# Patient Record
Sex: Female | Born: 1950 | Race: White | Hispanic: No | Marital: Married | Smoking: Former smoker
Health system: Southern US, Community
[De-identification: ages and names within clinical notes are randomized; demographics above are authoritative.]

## PROBLEM LIST (undated history)

## (undated) DIAGNOSIS — K635 Polyp of colon: Secondary | ICD-10-CM

## (undated) DIAGNOSIS — J449 Chronic obstructive pulmonary disease, unspecified: Secondary | ICD-10-CM

## (undated) DIAGNOSIS — F419 Anxiety disorder, unspecified: Secondary | ICD-10-CM

## (undated) DIAGNOSIS — G8929 Other chronic pain: Secondary | ICD-10-CM

## (undated) DIAGNOSIS — I499 Cardiac arrhythmia, unspecified: Secondary | ICD-10-CM

## (undated) DIAGNOSIS — M51369 Other intervertebral disc degeneration, lumbar region without mention of lumbar back pain or lower extremity pain: Secondary | ICD-10-CM

## (undated) DIAGNOSIS — M5136 Other intervertebral disc degeneration, lumbar region: Secondary | ICD-10-CM

## (undated) DIAGNOSIS — M1990 Unspecified osteoarthritis, unspecified site: Secondary | ICD-10-CM

## (undated) DIAGNOSIS — J42 Unspecified chronic bronchitis: Secondary | ICD-10-CM

## (undated) DIAGNOSIS — F32A Depression, unspecified: Secondary | ICD-10-CM

## (undated) DIAGNOSIS — F329 Major depressive disorder, single episode, unspecified: Secondary | ICD-10-CM

## (undated) DIAGNOSIS — R002 Palpitations: Secondary | ICD-10-CM

## (undated) DIAGNOSIS — R0789 Other chest pain: Secondary | ICD-10-CM

## (undated) DIAGNOSIS — M5030 Other cervical disc degeneration, unspecified cervical region: Secondary | ICD-10-CM

## (undated) DIAGNOSIS — I4891 Unspecified atrial fibrillation: Secondary | ICD-10-CM

## (undated) DIAGNOSIS — J45909 Unspecified asthma, uncomplicated: Secondary | ICD-10-CM

## (undated) DIAGNOSIS — E039 Hypothyroidism, unspecified: Secondary | ICD-10-CM

## (undated) DIAGNOSIS — J189 Pneumonia, unspecified organism: Secondary | ICD-10-CM

## (undated) DIAGNOSIS — E785 Hyperlipidemia, unspecified: Secondary | ICD-10-CM

## (undated) DIAGNOSIS — M542 Cervicalgia: Secondary | ICD-10-CM

## (undated) DIAGNOSIS — M549 Dorsalgia, unspecified: Secondary | ICD-10-CM

## (undated) DIAGNOSIS — K759 Inflammatory liver disease, unspecified: Secondary | ICD-10-CM

## (undated) DIAGNOSIS — G4730 Sleep apnea, unspecified: Secondary | ICD-10-CM

## (undated) DIAGNOSIS — E119 Type 2 diabetes mellitus without complications: Secondary | ICD-10-CM

## (undated) DIAGNOSIS — K219 Gastro-esophageal reflux disease without esophagitis: Secondary | ICD-10-CM

## (undated) DIAGNOSIS — I509 Heart failure, unspecified: Secondary | ICD-10-CM

## (undated) HISTORY — DX: Unspecified asthma, uncomplicated: J45.909

## (undated) HISTORY — PX: ANTERIOR CERVICAL DECOMP/DISCECTOMY FUSION: SHX1161

## (undated) HISTORY — DX: Heart failure, unspecified: I50.9

## (undated) HISTORY — DX: Polyp of colon: K63.5

## (undated) HISTORY — DX: Palpitations: R00.2

## (undated) HISTORY — DX: Other chest pain: R07.89

## (undated) HISTORY — DX: Unspecified atrial fibrillation: I48.91

## (undated) HISTORY — DX: Hyperlipidemia, unspecified: E78.5

## (undated) HISTORY — DX: Sleep apnea, unspecified: G47.30

## (undated) HISTORY — PX: WISDOM TOOTH EXTRACTION: SHX21

---
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
## 1974-06-09 HISTORY — PX: DILATION AND CURETTAGE OF UTERUS: SHX78

## 2000-10-19 ENCOUNTER — Encounter: Payer: Self-pay | Admitting: Neurosurgery

## 2000-10-22 ENCOUNTER — Encounter: Payer: Self-pay | Admitting: Neurosurgery

## 2000-10-22 ENCOUNTER — Ambulatory Visit (HOSPITAL_COMMUNITY): Admission: AD | Admit: 2000-10-22 | Discharge: 2000-10-22 | Payer: Self-pay | Admitting: Neurosurgery

## 2000-12-01 ENCOUNTER — Ambulatory Visit (HOSPITAL_COMMUNITY): Admission: RE | Admit: 2000-12-01 | Discharge: 2000-12-01 | Payer: Self-pay | Admitting: Neurosurgery

## 2000-12-01 ENCOUNTER — Encounter: Payer: Self-pay | Admitting: Neurosurgery

## 2001-02-01 ENCOUNTER — Ambulatory Visit (HOSPITAL_COMMUNITY): Admission: RE | Admit: 2001-02-01 | Discharge: 2001-02-01 | Payer: Self-pay | Admitting: Neurosurgery

## 2001-02-01 ENCOUNTER — Encounter: Payer: Self-pay | Admitting: Neurosurgery

## 2001-05-04 ENCOUNTER — Ambulatory Visit (HOSPITAL_COMMUNITY): Admission: RE | Admit: 2001-05-04 | Discharge: 2001-05-04 | Payer: Self-pay | Admitting: Neurosurgery

## 2001-05-04 ENCOUNTER — Encounter: Payer: Self-pay | Admitting: Neurosurgery

## 2001-07-30 ENCOUNTER — Ambulatory Visit (HOSPITAL_COMMUNITY): Admission: RE | Admit: 2001-07-30 | Discharge: 2001-07-30 | Payer: Self-pay | Admitting: Neurosurgery

## 2001-07-30 ENCOUNTER — Encounter: Payer: Self-pay | Admitting: Neurosurgery

## 2001-09-27 ENCOUNTER — Encounter: Payer: Self-pay | Admitting: Orthopedic Surgery

## 2001-09-27 ENCOUNTER — Ambulatory Visit (HOSPITAL_COMMUNITY): Admission: RE | Admit: 2001-09-27 | Discharge: 2001-09-27 | Payer: Self-pay | Admitting: Orthopedic Surgery

## 2001-11-29 ENCOUNTER — Encounter: Payer: Self-pay | Admitting: Neurosurgery

## 2001-11-29 ENCOUNTER — Encounter: Admission: RE | Admit: 2001-11-29 | Discharge: 2001-11-29 | Payer: Self-pay | Admitting: Neurosurgery

## 2002-02-28 ENCOUNTER — Encounter: Admission: RE | Admit: 2002-02-28 | Discharge: 2002-02-28 | Payer: Self-pay | Admitting: Neurosurgery

## 2002-02-28 ENCOUNTER — Encounter: Payer: Self-pay | Admitting: Neurosurgery

## 2002-04-22 ENCOUNTER — Encounter: Admission: RE | Admit: 2002-04-22 | Discharge: 2002-04-22 | Payer: Self-pay | Admitting: Family Medicine

## 2002-04-22 ENCOUNTER — Encounter: Payer: Self-pay | Admitting: Family Medicine

## 2003-05-16 ENCOUNTER — Encounter: Admission: RE | Admit: 2003-05-16 | Discharge: 2003-05-16 | Payer: Self-pay | Admitting: *Deleted

## 2003-06-10 DIAGNOSIS — E785 Hyperlipidemia, unspecified: Secondary | ICD-10-CM

## 2003-06-10 HISTORY — DX: Hyperlipidemia, unspecified: E78.5

## 2006-02-03 ENCOUNTER — Ambulatory Visit (HOSPITAL_COMMUNITY): Admission: RE | Admit: 2006-02-03 | Discharge: 2006-02-03 | Payer: Self-pay | Admitting: Anesthesiology

## 2006-07-28 ENCOUNTER — Inpatient Hospital Stay (HOSPITAL_COMMUNITY): Admission: EM | Admit: 2006-07-28 | Discharge: 2006-07-30 | Payer: Self-pay | Admitting: Emergency Medicine

## 2006-07-29 ENCOUNTER — Encounter (INDEPENDENT_AMBULATORY_CARE_PROVIDER_SITE_OTHER): Payer: Self-pay | Admitting: Cardiovascular Disease

## 2007-03-01 ENCOUNTER — Ambulatory Visit (HOSPITAL_COMMUNITY): Admission: RE | Admit: 2007-03-01 | Discharge: 2007-03-01 | Payer: Self-pay | Admitting: Cardiovascular Disease

## 2007-07-12 ENCOUNTER — Ambulatory Visit: Payer: Self-pay | Admitting: Internal Medicine

## 2007-07-27 ENCOUNTER — Ambulatory Visit: Payer: Self-pay | Admitting: Internal Medicine

## 2007-07-27 LAB — CONVERTED CEMR LAB
ALT: 19 units/L (ref 0–35)
AST: 19 units/L (ref 0–37)
TSH: 1.96 microintl units/mL (ref 0.35–5.50)

## 2007-09-10 ENCOUNTER — Ambulatory Visit: Payer: Self-pay | Admitting: Internal Medicine

## 2007-10-21 ENCOUNTER — Ambulatory Visit: Payer: Self-pay | Admitting: Internal Medicine

## 2007-10-21 ENCOUNTER — Ambulatory Visit: Payer: Self-pay

## 2007-10-21 ENCOUNTER — Encounter: Payer: Self-pay | Admitting: Internal Medicine

## 2007-11-17 ENCOUNTER — Ambulatory Visit: Payer: Self-pay | Admitting: Internal Medicine

## 2007-11-17 LAB — CONVERTED CEMR LAB
AST: 18 units/L (ref 0–37)
Cholesterol: 166 mg/dL (ref 0–200)
HDL: 39.9 mg/dL (ref >39.0)
LDL Cholesterol: 108 mg/dL — ABNORMAL HIGH (ref 0–99)
Total CHOL/HDL Ratio: 4.2
Triglycerides: 92 mg/dL (ref 0–149)
VLDL: 18 mg/dL (ref 0–40)

## 2008-06-30 ENCOUNTER — Encounter: Payer: Self-pay | Admitting: Internal Medicine

## 2008-07-15 ENCOUNTER — Emergency Department (HOSPITAL_BASED_OUTPATIENT_CLINIC_OR_DEPARTMENT_OTHER): Admission: EM | Admit: 2008-07-15 | Discharge: 2008-07-15 | Payer: Self-pay | Admitting: Emergency Medicine

## 2008-07-15 ENCOUNTER — Ambulatory Visit: Payer: Self-pay | Admitting: Interventional Radiology

## 2008-08-04 ENCOUNTER — Ambulatory Visit: Payer: Self-pay | Admitting: Internal Medicine

## 2008-08-06 DIAGNOSIS — E785 Hyperlipidemia, unspecified: Secondary | ICD-10-CM | POA: Insufficient documentation

## 2008-08-06 DIAGNOSIS — I4891 Unspecified atrial fibrillation: Secondary | ICD-10-CM | POA: Insufficient documentation

## 2009-05-01 ENCOUNTER — Telehealth: Payer: Self-pay | Admitting: Internal Medicine

## 2009-05-07 ENCOUNTER — Telehealth: Payer: Self-pay | Admitting: Internal Medicine

## 2009-06-09 HISTORY — PX: PLANTAR FASCIA SURGERY: SHX746

## 2009-07-02 ENCOUNTER — Encounter: Payer: Self-pay | Admitting: Internal Medicine

## 2009-07-13 ENCOUNTER — Ambulatory Visit: Payer: Self-pay | Admitting: Internal Medicine

## 2009-07-17 ENCOUNTER — Encounter: Payer: Self-pay | Admitting: Internal Medicine

## 2009-07-18 ENCOUNTER — Telehealth: Payer: Self-pay | Admitting: Internal Medicine

## 2009-07-19 ENCOUNTER — Telehealth: Payer: Self-pay | Admitting: Internal Medicine

## 2009-07-20 ENCOUNTER — Encounter: Payer: Self-pay | Admitting: Internal Medicine

## 2009-07-30 ENCOUNTER — Ambulatory Visit (HOSPITAL_COMMUNITY): Admission: RE | Admit: 2009-07-30 | Discharge: 2009-07-30 | Payer: Self-pay | Admitting: Internal Medicine

## 2009-07-30 ENCOUNTER — Ambulatory Visit: Payer: Self-pay

## 2009-07-30 ENCOUNTER — Encounter: Payer: Self-pay | Admitting: Internal Medicine

## 2009-07-30 ENCOUNTER — Ambulatory Visit: Payer: Self-pay | Admitting: Cardiovascular Disease

## 2009-08-01 ENCOUNTER — Encounter: Payer: Self-pay | Admitting: Internal Medicine

## 2009-08-13 ENCOUNTER — Telehealth: Payer: Self-pay | Admitting: Internal Medicine

## 2009-08-20 ENCOUNTER — Ambulatory Visit: Payer: Self-pay

## 2009-08-20 ENCOUNTER — Ambulatory Visit: Payer: Self-pay | Admitting: Internal Medicine

## 2009-08-23 ENCOUNTER — Encounter: Payer: Self-pay | Admitting: Internal Medicine

## 2009-08-23 ENCOUNTER — Telehealth: Payer: Self-pay | Admitting: Internal Medicine

## 2009-08-23 DIAGNOSIS — R002 Palpitations: Secondary | ICD-10-CM | POA: Insufficient documentation

## 2009-08-31 ENCOUNTER — Ambulatory Visit: Payer: Self-pay | Admitting: Internal Medicine

## 2009-09-14 ENCOUNTER — Telehealth: Payer: Self-pay | Admitting: Internal Medicine

## 2009-09-26 ENCOUNTER — Telehealth: Payer: Self-pay | Admitting: Internal Medicine

## 2009-11-30 ENCOUNTER — Ambulatory Visit: Payer: Self-pay | Admitting: Internal Medicine

## 2010-07-07 LAB — CONVERTED CEMR LAB
BUN: 12 mg/dL (ref 6–23)
Basophils Absolute: 0 10*3/uL (ref 0.0–0.1)
Basophils Relative: 0 % (ref 0–1)
CO2: 28 meq/L (ref 19–32)
Calcium: 9.3 mg/dL (ref 8.4–10.5)
Chloride: 104 meq/L (ref 96–112)
Creatinine, Ser: 0.75 mg/dL (ref 0.40–1.20)
Eosinophils Absolute: 0.5 10*3/uL (ref 0.0–0.7)
Eosinophils Relative: 5 % (ref 0–5)
FSH: 1.3 milliintl units/mL
Glucose, Bld: 109 mg/dL — ABNORMAL HIGH (ref 70–99)
HCT: 34 % — ABNORMAL LOW (ref 36.0–46.0)
Hemoglobin: 11.8 g/dL — ABNORMAL LOW (ref 12.0–15.0)
Lymphocytes Relative: 26 % (ref 12–46)
Lymphs Abs: 2.5 10*3/uL (ref 0.7–4.0)
MCHC: 34.7 g/dL (ref 30.0–36.0)
MCV: 93.4 fL (ref 78.0–100.0)
Monocytes Absolute: 0.8 10*3/uL (ref 0.1–1.0)
Monocytes Relative: 8 % (ref 3–12)
Neutro Abs: 5.9 10*3/uL (ref 1.7–7.7)
Neutrophils Relative %: 61 % (ref 43–77)
Platelets: 257 10*3/uL (ref 150–400)
Potassium: 4.4 meq/L (ref 3.5–5.3)
Pro B Natriuretic peptide (BNP): 263 pg/mL — ABNORMAL HIGH (ref 0.0–100.0)
RBC: 3.64 M/uL — ABNORMAL LOW (ref 3.87–5.11)
RDW: 14.1 % (ref 11.5–15.5)
Sodium: 140 meq/L (ref 135–145)
WBC: 9.7 10*3/uL (ref 4.0–10.5)

## 2010-07-09 NOTE — Miscellaneous (Signed)
  Clinical Lists Changes  Medications: Changed medication from LOPRESSOR 50 MG TABS (METOPROLOL TARTRATE) 1/2 tab daily or as directed to LOPRESSOR 50 MG TABS (METOPROLOL TARTRATE) 1/2 tab  2 times per day

## 2010-07-09 NOTE — Progress Notes (Signed)
Summary: receive called x 2 from life watch  lm to cb   Phone Note Call from Patient Call back at Home Phone [phone] Call back at Work Phone [phone]   Caller: Patient Reason for Call: Talk to Nurse Summary of Call: per pt calling, have a heart monitor on , receive a life watch x 2 this am . pt wondering is their anything wrong. Initial call taken by: Lorne Skeens,  September 14, 2009 9:23 AM  Follow-up for Phone Call        Reviewed life watch strips. Appears to be afib on 4 seperate occassions ths AM. Pt reports being called around 6 AM and 8 AM.  At 6 AM she felt "odd" and at 8 AM was aware of "rapid heart beat". Pt feels OK now. Is taking flecanide and metoprolol as prescibed. Taking ASA(81 mg) and took extra dose of ASA this AM. Will let Dr. Tenny Craw know Follow-up by: Dossie Arbour, RN, BSN,  September 14, 2009 10:49 AM  Additional Follow-up for Phone Call Additional follow up Details #1::        Dr. Tenny Craw notified and strips faxed to her to review. Dr. Tenny Craw would like [place] to let pt know she will review and then be back in touch with pt. Called pt to let her know this. Left message to call back. Dossie Arbour, RN, BSN  September 14, 2009 11:13 AM Pt. given this information. Pt states best way to reach her is on her cell phone--[phone] Additional Follow-up by: Dossie Arbour, RN, BSN,  September 14, 2009 3:30 PM     Appended Document: receive called x 2 from life watch  lm to cb Spoke with patient.  She said that since starting flecanide her palpitations have gotten better.  They are not gone, but much improved.  Shorter lived.  She had been creeping up on her caffeine some and is cutting back since Fri.  Fri AM she did feel a little odd.  OK since. Plan:  WIll discuss with EP   Patient needs refill on metoprolol.  Will call in   Will call pt later this wk  Appended Document: receive called x 2 from life watch  lm to cb Called patient.  She said since starting flecanide  the palpitations are much better.  they are not gone, but when she does have them they are shorter lived.  She admits to using more caffeine.  She has cut back since Friday and has not had palp since. Rec.:  Continue I will discuss with EP Patient needs refill on metoprolol WIll call her back later this wk.  Appended Document: receive called x 2 from life watch  lm to cb talked with pt by telephone--she actually needs lovastatin refilled and not metoprolol-   Clinical Lists Changes  Medications: Changed medication from MEVACOR 20 MG TABS (LOVASTATIN) 1 tab once daily to MEVACOR 20 MG TABS (LOVASTATIN) 1 tab once daily - Signed Rx of MEVACOR 20 MG TABS (LOVASTATIN) 1 tab once daily;  #30 x 6;  Signed;  Entered by: Katina Dung, RN, BSN;  Authorized by: Sherrill Raring, MD, [facility];  Method used: Electronically to [facility] [phone]*, [address] [place], [place], [place]  [address], Ph: [phone], Fax: [phone]    Prescriptions: MEVACOR 20 MG TABS (LOVASTATIN) 1 tab once daily  #30 x 6   Entered by:   Katina Dung, RN, BSN   Authorized by:  Sherrill Raring, MD, [facility]   Signed by:   Katina Dung, RN, BSN on 09/18/2009   Method used:   Electronically to        [facility] [address] [facility] [phone]* (retail)       [address] [place], [place]  [address]       Ph: [phone]       Fax: [phone]   RxID:   [id number]

## 2010-07-09 NOTE — Miscellaneous (Signed)
  Clinical Lists Changes  Problems: Added new problem of PALPITATIONS (ICD-785.1) Orders: Added new Referral order of Event (Event) - Signed 

## 2010-07-09 NOTE — Progress Notes (Signed)
Summary: has pt being in afib / heart monitor for 3 weeks   Phone Note Call from Patient Call back at [facility] Phone [phone] Call back at Work Phone [phone]   Caller: Patient Reason for Call: Talk to Nurse Summary of Call: wearing heart mointor for 3 week. hasn't receive test result yet , has pt being in afib.  Initial call taken by: Lorne Skeens,  September 26, 2009 10:46 AM  Follow-up for Phone Call        Called patient with monitor results....advised Dr.Ross will review next week and will decide about follow up. Will call her back again after MD review. Follow-up by: Suzan Garibaldi RN  Additional Follow-up for Phone Call Additional follow up Details #1::        Reviewed full heart monitor.  She had only one day of afib.  when I talked to her on the phone a couple wks ago she said she had had much less palpitations on the flecanide.   I discussed with EP.  I would continue Flecanide.  Would check trough level again.   Confirm that she was on current dose when we did treadmill. Additional Follow-up by: Sherrill Raring, MD, [facility],  October 04, 2009 9:48 AM     Appended Document: has pt being in afib / heart monitor for 3 weeks Called patient...still on Flecainide 100mg  two times a day ...will see Dr. Tenny Craw again on 6/24 and can have repeat labs done then.

## 2010-07-09 NOTE — Procedures (Signed)
Summary: Holter and Event  Holter and Event   Imported By: Erle Crocker 10/10/2009 08:56:50  _____________________________________________________________________  External Attachment:    Type:   Image     Comment:   External Document

## 2010-07-09 NOTE — Progress Notes (Signed)
Summary: afib episode   Phone Note Call from Patient Call back at Home Phone [phone]   Caller: Patient Reason for Call: Talk to Nurse Summary of Call: request to know what do if you should afib episode, has a short one yesterday... took ASA and Metoprolol, only lasted about 20 minutes Initial call taken by: Migdalia Dk,  July 19, 2009 9:50 AM  Follow-up for Phone Call        spoke with pt, yesterday she had an episode of "hard" atrial fib and took an extra 2 doses of 50mg  metoprolol. she called to make sure that was ok or if she should have done something else. cautioned pt about taking 2 extra doses without checking her pulse to make sure not too low before the second dose. she is currently taking metoprolol tart 50mg  1/2 tablet at bedtime. pt states her palpitations are getting more freq and questioned if she should increase meds. explained to pt that tart is usually a twice a day dosage and will foward to dr Tenny Craw for her review. the pt states she has been on higher doses of metoprolol but they were decreased due to fatique. pt voiced understanding to call if the episodes of atrial fib do not break or she feels faint with the episodes. will discuss med changes with dr Tenny Craw and contact pt Deliah Goody, RN  July 19, 2009 10:46 AM   Additional Follow-up for Phone Call Additional follow up Details #1::        make sure echo scheduled soon. Additional Follow-up by: Sherrill Raring, MD, [facility],  July 19, 2009 3:28 PM     Appended Document: afib episode Called patient... she will take Metoprolol tartrate 50 mg one half 2 times per day ( she states that this it what was ordered originally on the bottle ).Her echo is scheduled for 2/21. Advised her that we would follow up with her after the echo.

## 2010-07-09 NOTE — Progress Notes (Signed)
Summary: question regarding metoprolol - afib   Phone Note Call from Patient Call back at Home Phone [phone] Call back at Work Phone [phone]   Caller: Patient Reason for Call: Talk to Nurse Details for Reason: Per pt calling, aware Annice Pih is not in office today, how much metoprolol can pt take when she in afib. Initial call taken by: Lorne Skeens,  July 18, 2009 12:29 PM  Follow-up for Phone Call        Spoke with pt. Patient would like to know how much extra  Metoprolol she can take when she is in A-Fib. Pt. states she had taken one extra Metoprolol 50 mg today at 11:00 am heart rhythm is better now. Pt. to take one extra 50 mg when in A-Fib per Dr. Tenny Craw  MD. Pt. aware. Pt. has an appointment to have an echo on 07/30/09. Follow-up by: Ollen Gross, RN, BSN,  July 18, 2009 12:46 PM

## 2010-07-09 NOTE — Progress Notes (Signed)
Summary: Pt having palpitations   Phone Note Call from Patient Call back at [employer] Phone [phone]   Caller: Patient Summary of Call: Pt still having palpitation Initial call taken by: Judie Grieve,  August 23, 2009 10:43 AM  Follow-up for Phone Call        She needs an event monitor to confirm afib and rates. Follow-up by: Sherrill Raring, MD, [facility],  August 23, 2009 5:23 PM     Appended Document: Pt having palpitations Called patient...she is aware of Dr.Analena Gama ordering event monitor...will make referral.

## 2010-07-09 NOTE — Assessment & Plan Note (Signed)
Summary: follow up atrial fib/jr  Medications Added FUROSEMIDE 40 MG TABS (FUROSEMIDE) 1 tab 5 -6 days a week KLOR-CON M20 20 MEQ CR-TABS (POTASSIUM CHLORIDE CRYS CR) 1 tab 5-6 tabs a week * MUNICEX prn * GRAPE  SEED OIL 1 tab once daily * GARLIC 1 tab once daily * VITAMIN D 50,000 UNITS 1 tab q weekly * ESTRACE 3 times a week * BLACK COHOSH 1 tab once daily * MILK THISTLE 1 tab once daily * MULTI VIT 1 tab once daily * CALICUM 1 tab once daily * VITAMIN B 12 1 tab once daily * VITAMIN B 50 2 tabs once daily * FISH OIL 2 tabs once daily      Allergies Added:   Visit Type:  Follow-up Primary Provider:  DR Sherle Poe   History of Present Illness: Patient is a 60 year old with a history of intermittent atrial fibrillatoin, GERD, hypothyroidism.  She was last in clinic this spring. Since seen she has been doing fairly well with infrequent palpitations.  They occur about 1 x per week and last about 10 min.  She has taken her pulse and it is around 70 at times.  No dizziness. She is trying to lose wt.  Labs recently checked showed HgbA1C of 7.2.  She admits to lots of candy to quit tobacco. Stopped mevacor.  [place] it made her achy.  Feels better now.  Current Medications (verified): 1)  Lopressor 50 Mg Tabs (Metoprolol Tartrate) .... 1/2 Tab  2 Times Per Day 2)  Oxycodone Hcl 30 Mg Tabs (Oxycodone Hcl) .Marland Kitchen.. 1 Tab 6 Times A Day 3)  Aciphex 20 Mg Tbec (Rabeprazole Sodium) .Marland Kitchen.. 1 or 2 Times A Day 4)  Cymbalta 30 Mg Cpep (Duloxetine Hcl) .Marland Kitchen.. 1 Tab Three Times A Day 5)  Zyrtec Hives Relief 10 Mg Tabs (Cetirizine Hcl) .Marland Kitchen.. 1 Tab Once Daily 6)  Xanax 0.25 Mg Tabs (Alprazolam) .Marland Kitchen.. 1 Tab Once Daily 7)  Omnaris 50 Mcg/act Susp (Ciclesonide) .... Both Nostrils Once A Day (2 Sprays) 8)  Levothroid 50 Mcg Tabs (Levothyroxine Sodium) .... 5 Days 1 Tab A Day The Other Two Days 2 Tab 9)  Enulose 10 Gm/52ml Soln (Lactulose Encephalopathy) .... Daily 10)  Aspirin 81 Mg  Tabs (Aspirin) .Marland Kitchen.. 1  Tab Once Daily 11)  Flecainide Acetate 100 Mg Tabs (Flecainide Acetate) .Marland Kitchen.. 1 Two Times A Day 12)  Furosemide 40 Mg Tabs (Furosemide) .Marland Kitchen.. 1 Tab 5 -6 Days A Week 13)  Klor-Con M20 20 Meq Cr-Tabs (Potassium Chloride Crys Cr) .Marland Kitchen.. 1 Tab 5-6 Tabs A Week 14)  Municex .... Prn 15)  Grape  Seed Oil .Marland Kitchen.. 1 Tab Once Daily 16)  Garlic .Marland Kitchen.. 1 Tab Once Daily 17)  Vitamin D 50,000 Units .Marland Kitchen.. 1 Tab Q Weekly 18)  Estrace .Marland Kitchen.. 3 Times A Week 19)  Black Cohosh .... 1 Tab Once Daily 20)  Milk Thistle .Marland Kitchen.. 1 Tab Once Daily 21)  Multi Vit .Marland Kitchen.. 1 Tab Once Daily 22)  Calicum .Marland Kitchen.. 1 Tab Once Daily 23)  Vitamin B 12 .Marland Kitchen.. 1 Tab Once Daily 24)  Vitamin B 50 .... 2 Tabs Once Daily 25)  Fish Oil .... 2 Tabs Once Daily  Allergies (verified): 1)  ! Penicillin 2)  ! * Question Crab Meat  Past History:  Past medical, surgical, family and social histories (including risk factors) reviewed, and no changes noted (except as noted below).  Past Medical History: Atrial fibrillation Dyslipidemia GE reflux Back pain Diabetes  Past Surgical History: Reviewed history from 08/06/2008 and no changes required. Diskectomy/fusion C4-5, C5-6  Family History: Reviewed history from 08/06/2008 and no changes required. Mother died of AVM in brain age 44.  Hx CHF Father died age 24 of brain aneurysm.  Hx Ca P[place] GF with CAD in 60s  Social History: Reviewed history from 08/06/2008 and no changes required. Quit tobacco Occasional EtOH  Vital Signs:  Patient profile:   60 year old female Height:      66 inches Weight:      232 pounds BMI:     37.58 Pulse rate:   60 / minute BP sitting:   125 / 68  (left arm) Cuff size:   large  Vitals Entered By: Burnett Kanaris, CNA (November 30, 2009 10:16 AM)  Physical Exam  Additional Exam:  Obese 58 year odl in NAD HEENT:  Normocephalic, atraumatic. EOMI, PERRLA.  Neck: JVP is normal. No thyromegaly. No bruits.  Lungs: clear to auscultation. No rales no wheezes.    Heart: Regular rate and rhythm. Normal S1, S2. No S3.   No significant murmurs. PMI not displaced.  Abdomen:  Obese.Supple, nontender. Normal bowel sounds. No masses. No hepatomegaly.  Extremities:   Good distal pulses throughout. No lower extremity edema.  Musculoskeletal :moving all extremities.  Neuro:   alert and oriented x3.    EKG  Procedure date:  11/30/2009  Findings:      NSR.  60 bpm.  PR interval 200 msec  Impression & Recommendations:  Problem # 1:  ATRIAL FIBRILLATION (ICD-427.31) Seems to be doing good on Flecanide.  I would continue.  Keep on Lopressor.  Keep on ASA  Problem # 2:  HYPERLIPIDEMIA-MIXED (ICD-272.4) Did not tolerate due to aches.  will follow for now.  Will need repeat lipids off meds. The following medications were removed from the medication list:    Mevacor 20 Mg Tabs (Lovastatin) .Marland Kitchen... 1 tab once daily  Other Orders: EKG w/ Interpretation (93000)

## 2010-07-09 NOTE — Miscellaneous (Signed)
  Clinical Lists Changes  Medications: Added new medication of FLECAINIDE ACETATE 50 MG TABS (FLECAINIDE ACETATE) 1 pill two times per day - Signed Rx of FLECAINIDE ACETATE 50 MG TABS (FLECAINIDE ACETATE) 1 pill two times per day;  #60 x 1;  Signed;  Entered by: Layne Benton, RN, BSN;  Authorized by: Sherrill Raring, MD, [facility];  Method used: Electronically to [facility] [phone]*, [address] [place], [place], [place]  [address], Ph: [phone], Fax: [phone]    Prescriptions: FLECAINIDE ACETATE 50 MG TABS (FLECAINIDE ACETATE) 1 pill two times per day  #60 x 1   Entered by:   Layne Benton, RN, BSN   Authorized by:   Sherrill Raring, MD, [facility]   Signed by:   Layne Benton, RN, BSN on 08/01/2009   Method used:   Electronically to        [facility] [phone]* (retail)       [address]       [place], [place]  [address]       Ph: [phone]       Fax: [phone]   RxID:   [id number]

## 2010-07-09 NOTE — Miscellaneous (Signed)
  Clinical Lists Changes  Orders: Added new Referral order of Echocardiogram (Echo) - Signed 

## 2010-07-09 NOTE — Assessment & Plan Note (Signed)
Summary: add on per pcp a fib  jr  Medications Added MORPHINE SULFATE 30 MG TABS (MORPHINE SULFATE) 1 tab bid OXYCODONE HCL 30 MG TABS (OXYCODONE HCL) 1 tab 6 times a day ACIPHEX 20 MG TBEC (RABEPRAZOLE SODIUM) 1 or 2 times a day CYMBALTA 30 MG CPEP (DULOXETINE HCL) 1 tab three times a day ZYRTEC HIVES RELIEF 10 MG TABS (CETIRIZINE HCL) 1 tab once daily XANAX 0.25 MG TABS (ALPRAZOLAM) 1 tab once daily OMNARIS 50 MCG/ACT SUSP (CICLESONIDE) both nostrils once a day (2 Sprays) LEVOTHROID 50 MCG TABS (LEVOTHYROXINE SODIUM) 5 days 1 tab a day the other two days 2 tab ENULOSE 10 GM/15ML SOLN (LACTULOSE ENCEPHALOPATHY) daily ASPIRIN 81 MG  TABS (ASPIRIN) 1 tab once daily        Visit Type:  Follow-up/ add on Primary Provider:  DR Sherle Poe  CC:  irregular heart beat .  History of Present Illness: Patient is a 60 year old with a history of intermittent atrial fibrillatoin, GERD, hypothyroidism.  She was last in clinci in 2009 I received a call today from primary clinic.  The patient was found to be in rapid afib. Per the patient she has not felt completely well for about 3 wiks.  She says about 2 wks ago she felt that she was going in and out of afib.  She has taken extral 1/2s of toprol. She has been more short of breath, more fatigiged.  Denies chest pain.  No wheezes. Note she was seen a in primary clinic this week.  CXR was done that showed no pneumonia.  She received a zpac for bronchitis.  She notes occasional fevers.  Denies sputum production.  Allergies: 1)  ! Penicillin 2)  ! * Question Crab Meat  Past History:  Past Medical History: Last updated: 08-17-2008 Atrial fibrillation Dyslipidemia GE reflux Back pain  Past Surgical History: Last updated: 08-17-2008 Diskectomy/fusion C4-5, C5-6  Family History: Last updated: Aug 17, 2008 Mother died of AVM in brain age 62.  Hx CHF Father died age 79 of brain aneurysm.  Hx Ca P[place] GF with CAD in 105s  Social  History: Last updated: August 17, 2008 Quit tobacco Occasional EtOH  Review of Systems       All systems reviewed. Negative to the above problem except as noted above.  Vital Signs:  Patient profile:   60 year old female Height:      66 inches Weight:      231 pounds BMI:     37.42 Pulse rate:   79 / minute BP sitting:   100 / 60  (left arm) Cuff size:   large  Vitals Entered By: Kem Parkinson (July 13, 2009 2:57 PM)  Physical Exam  General:  Well developed, well nourished, in no acute distress. Head:  normocephalic and atraumatic Neck:  JVP is normal.  No bruits. Lungs:  Bilateral rhonchi.  Faint wheeze.  No rales.  Moving air ok. Heart:  RRR.  S1, S2.  No S3.  No murmurs. Abdomen:  Supple.  No hepatomegaly.  No masses. Pulses:  2+ pulses. Extremities:  No edema. Neurologic:  Alert and oriented x 3.   EKG  Procedure date:  07/13/2009  Findings:      Sinus rhythm  73 bpm.    Impression & Recommendations:  Problem # 1:  ATRIAL FIBRILLATION (ICD-427.31) The patient was in afib earlier today.  Now she is back in SR.   Above may have been excerbated by URI.  WIll need to  check TSH Will check other labs. BP is marginal.  I do not think that she will tolerate much more b blocker.  Will review tests and get back with patieht. Lungs with mild wheeze.  Await BNP.  Problem # 2:  HYPERLIPIDEMIA-MIXED (ICD-272.4) Continue for now. Her updated medication list for this problem includes:    Mevacor 20 Mg Tabs (Lovastatin) .Marland Kitchen... 1 tab once daily  Other Orders: EKG w/ Interpretation (93000) T-Basic Metabolic Panel (16010-93235) T-BNP  (B Natriuretic Peptide) (57322-02542) T-CBC w/Diff (70623-76283) T-FSH (15176-16073)  Appended Document: add on per pcp a fib  jr Schedule echo to evaluate LV function.  Appended Document: add on per pcp a fib  jr [facility] that we will be calling her back to schedule ECHO.

## 2010-07-09 NOTE — Progress Notes (Signed)
Summary: sob/palps  Medications Added FLECAINIDE ACETATE 50 MG TABS (FLECAINIDE ACETATE) 2 pills  two times per day       Phone Note Call from Patient Call back at Work Phone [phone]   Caller: Patient Summary of Call: yesterday pt was having sob and palps, retaining fluid Initial call taken by: Migdalia Dk,  August 13, 2009 9:04 AM  Follow-up for Phone Call        Called patient back. She states that she went to the [employer] yesterday and did lots of walking. She noticed that she had more palps and became SOB easily. She took extra Metoprolol short acting 2 times yesterday. Whe she got home she noticed that her legs were more swollen that usual. Advised will discuss with Dr.Huxley Shurley and call her back.   Follow-up by: Suzan Garibaldi RN  Additional Follow-up for Phone Call Additional follow up Details #1::        Dr. Tenny Craw spoke with Dr.Taylor about this case...advised to increase Flecainide to 100mg  2 times per day, check trough level on 3/14 with a GXT test (hold Flecainide day of GXT). Patient verbalized understanding.  Additional Follow-up by: J REISS RN    New/Updated Medications: FLECAINIDE ACETATE 50 MG TABS (FLECAINIDE ACETATE) 2 pills  two times per day

## 2010-08-26 ENCOUNTER — Ambulatory Visit (INDEPENDENT_AMBULATORY_CARE_PROVIDER_SITE_OTHER): Payer: 59 | Admitting: Internal Medicine

## 2010-08-26 ENCOUNTER — Encounter: Payer: Self-pay | Admitting: Internal Medicine

## 2010-08-26 DIAGNOSIS — I4891 Unspecified atrial fibrillation: Secondary | ICD-10-CM

## 2010-08-26 DIAGNOSIS — E7800 Pure hypercholesterolemia, unspecified: Secondary | ICD-10-CM

## 2010-08-26 DIAGNOSIS — R0789 Other chest pain: Secondary | ICD-10-CM | POA: Insufficient documentation

## 2010-09-03 ENCOUNTER — Encounter: Payer: Self-pay | Admitting: Internal Medicine

## 2010-09-05 NOTE — Assessment & Plan Note (Addendum)
Summary: pr check out/sf  Medications Added FUROSEMIDE 40 MG TABS (FUROSEMIDE) 3-4 times per week KLOR-CON M20 20 MEQ CR-TABS (POTASSIUM CHLORIDE CRYS CR) 3-4 tiimes per week ESTRADIOL 0.5 MG TABS (ESTRADIOL) once daily MEDROXYPROGESTERONE ACETATE 10 MG TABS (MEDROXYPROGESTERONE ACETATE) once daily        Visit Type:  Follow-up Primary Provider:  DR Sherle Poe   History of Present Illness: Patient is a 60 year old with a history of intermittent atrial fibrillatoin, GERD, hypothyroidism.  She was last in clinic last June. She notes a few days ago while in the kitchen she developed som chest pressure.   Not like heartburn she has had in the past.  No associated SOB.  Lasted about 30 minutes.  None since. She currently has a head cold.  Does have some SOB with acitivty since it started.  Did not have any problems walking prior.  Current Medications (verified): 1)  Lopressor 50 Mg Tabs (Metoprolol Tartrate) .... 1/2 Tab  2 Times Per Day 2)  Oxycodone Hcl 30 Mg Tabs (Oxycodone Hcl) .Marland Kitchen.. 1 Tab 6 Times A Day 3)  Aciphex 20 Mg Tbec (Rabeprazole Sodium) .Marland Kitchen.. 1 or 2 Times A Day 4)  Cymbalta 30 Mg Cpep (Duloxetine Hcl) .Marland Kitchen.. 1 Tab Three Times A Day 5)  Zyrtec Hives Relief 10 Mg Tabs (Cetirizine Hcl) .Marland Kitchen.. 1 Tab Once Daily 6)  Xanax 0.25 Mg Tabs (Alprazolam) .Marland Kitchen.. 1 Tab Once Daily 7)  Omnaris 50 Mcg/act Susp (Ciclesonide) .... Both Nostrils Once A Day (2 Sprays) 8)  Levothroid 50 Mcg Tabs (Levothyroxine Sodium) .... 5 Days 1 Tab A Day The Other Two Days 2 Tab 9)  Enulose 10 Gm/45ml Soln (Lactulose Encephalopathy) .... Daily 10)  Aspirin 81 Mg  Tabs (Aspirin) .Marland Kitchen.. 1 Tab Once Daily 11)  Flecainide Acetate 100 Mg Tabs (Flecainide Acetate) .Marland Kitchen.. 1 Two Times A Day 12)  Furosemide 40 Mg Tabs (Furosemide) .... 3-4 Times Per Week 13)  Klor-Con M20 20 Meq Cr-Tabs (Potassium Chloride Crys Cr) .... 3-4 Tiimes Per Week 14)  Municex .... Prn 15)  Vitamin D 50,000 Units .Marland Kitchen.. 1 Tab Q Weekly 16)  Estradiol  0.5 Mg Tabs (Estradiol) .... Once Daily 17)  Black Cohosh .... 1 Tab Once Daily 18)  Milk Thistle .Marland Kitchen.. 1 Tab Once Daily 19)  Multi Vit .Marland Kitchen.. 1 Tab Once Daily 20)  Calicum .Marland Kitchen.. 1 Tab Once Daily 21)  Vitamin B 12 .Marland Kitchen.. 1 Tab Once Daily 22)  Vitamin B 50 .... 2 Tabs Once Daily 23)  Fish Oil .... 2 Tabs Once Daily 24)  Medroxyprogesterone Acetate 10 Mg Tabs (Medroxyprogesterone Acetate) .... Once Daily  Allergies: 1)  ! Penicillin 2)  ! * Question Crab Meat  Past History:  Past medical, surgical, family and social histories (including risk factors) reviewed, and no changes noted (except as noted below).  Past Medical History: Reviewed history from 11/30/2009 and no changes required. Atrial fibrillation Dyslipidemia GE reflux Back pain Diabetes  Past Surgical History: Reviewed history from 08/06/2008 and no changes required. Diskectomy/fusion C4-5, C5-6  Family History: Reviewed history from 08/06/2008 and no changes required. Mother died of AVM in brain age 52.  Hx CHF Father died age 40 of brain aneurysm.  Hx Ca [place] GF with CAD in 58s  Social History: Reviewed history from 08/06/2008 and no changes required. Quit tobacco Occasional EtOH  Review of Systems       ALl systems reviewed.  Neg to the above problem except as noted above.  Vital  Signs:  Patient profile:   60 year old female Height:      66 inches Weight:      242 pounds BMI:     39.20 Pulse rate:   59 / minute BP sitting:   100 / 60  (left arm)  Vitals Entered By: Laurance Flatten CMA (August 26, 2010 3:38 PM)  Physical Exam  Additional Exam:  Patient is in NAD HEENT:  Normocephalic, atraumatic. EOMI, PERRLA.  Neck: JVP is normal. No thyromegaly. No bruits.  Lungs: clear to auscultation. No rales no wheezes.  Heart: Regular rate and rhythm. Normal S1, S2. No S3.   No significant murmurs. PMI not displaced.  Abdomen:  Supple, nontender. Normal bowel sounds. No masses. No hepatomegaly.  Extremities:    Good distal pulses throughout. No lower extremity edema.  Musculoskeletal :moving all extremities.  Neuro:   alert and oriented x3.    EKG  Procedure date:  08/26/2010  Findings:      Sinus bradycardia.  59 bpm.  Impression & Recommendations:  Problem # 1:  CHEST DISCOMFORT (ICD-786.59) I am not sure what the recent spell was.  I am not convinced it is angina.  SOB now with cold.  I would follow.  Plan further testing only if she has more problems.  Problem # 2:  ATRIAL FIBRILLATION (ICD-427.31) Asymptomatic.  Continue flecanide.  Problem # 3:  HYPERLIPIDEMIA-MIXED (ICD-272.4) Will need to review.   Appended Document: pr check out/sf Called patient concerning Lipid panel. She will have this done next week at primary care doctors office next week and foward to Dr.Evonda Enge.

## 2010-09-24 LAB — CBC
HCT: 35.8 % — ABNORMAL LOW (ref 36.0–46.0)
Hemoglobin: 12.1 g/dL (ref 12.0–15.0)
MCHC: 33.8 g/dL (ref 30.0–36.0)
MCV: 94.3 fL (ref 78.0–100.0)
Platelets: 231 10*3/uL (ref 150–400)
RBC: 3.8 MIL/uL — ABNORMAL LOW (ref 3.87–5.11)
RDW: 12.3 % (ref 11.5–15.5)
WBC: 14.1 10*3/uL — ABNORMAL HIGH (ref 4.0–10.5)

## 2010-09-24 LAB — COMPREHENSIVE METABOLIC PANEL
ALT: 24 U/L (ref 0–35)
AST: 23 U/L (ref 0–37)
Albumin: 3.9 g/dL (ref 3.5–5.2)
Alkaline Phosphatase: 70 U/L (ref 39–117)
BUN: 7 mg/dL (ref 6–23)
CO2: 29 mEq/L (ref 19–32)
Calcium: 8.7 mg/dL (ref 8.4–10.5)
Chloride: 100 mEq/L (ref 96–112)
Creatinine, Ser: 0.7 mg/dL (ref 0.4–1.2)
GFR calc Af Amer: >60 mL/min (ref >60)
GFR calc non Af Amer: >60 mL/min (ref >60)
Glucose, Bld: 117 mg/dL — ABNORMAL HIGH (ref 70–99)
Potassium: 3.6 mEq/L (ref 3.5–5.1)
Sodium: 135 mEq/L (ref 135–145)
Total Bilirubin: 0.6 mg/dL (ref 0.3–1.2)
Total Protein: 6.9 g/dL (ref 6.0–8.3)

## 2010-09-24 LAB — URINALYSIS, ROUTINE W REFLEX MICROSCOPIC
Bilirubin Urine: NEGATIVE
Glucose, UA: NEGATIVE mg/dL
Hgb urine dipstick: NEGATIVE
Ketones, ur: NEGATIVE mg/dL
Nitrite: NEGATIVE
Protein, ur: NEGATIVE mg/dL
Specific Gravity, Urine: 1.005 (ref 1.005–1.030)
Urobilinogen, UA: 0.2 mg/dL (ref 0.0–1.0)
pH: 6.5 (ref 5.0–8.0)

## 2010-09-24 LAB — DIFFERENTIAL
Basophils Absolute: 0.1 10*3/uL (ref 0.0–0.1)
Basophils Relative: 1 % (ref 0–1)
Eosinophils Absolute: 0.2 10*3/uL (ref 0.0–0.7)
Eosinophils Relative: 2 % (ref 0–5)
Lymphocytes Relative: 11 % — ABNORMAL LOW (ref 12–46)
Lymphs Abs: 1.6 10*3/uL (ref 0.7–4.0)
Monocytes Absolute: 1.2 10*3/uL — ABNORMAL HIGH (ref 0.1–1.0)
Monocytes Relative: 9 % (ref 3–12)
Neutro Abs: 11 10*3/uL — ABNORMAL HIGH (ref 1.7–7.7)
Neutrophils Relative %: 78 % — ABNORMAL HIGH (ref 43–77)

## 2010-09-24 LAB — LIPASE, BLOOD: Lipase: 43 U/L (ref 23–300)

## 2010-10-22 NOTE — Assessment & Plan Note (Signed)
[facility]                            CARDIOLOGY OFFICE NOTE   NAME:[name], Taylor PASQUARELLA                       MRN:          [id number]  DATE:07/12/2007                            DOB:          05-05-1951    IDENTIFICATION:  Ms. Taylor [name] is a 60 year old who was self-referred for  continued cardiac care.  She has a history of atrial fibrillation.  She  was previously followed by Dr. Algie Coffer.   The patient, on review of the records, was admitted in February of last  year, when her heart was racing.  She had some chest tightness with  this.  She was found to be in atrial fibrillation with rapid response  and was treated with like with medicines.  Her troponin was minimally  increased, and she underwent cardiac catheterization, which showed no  significant disease, normal coronaries.  She also had an echocardiogram  done that was normal, left atrium normal.  She was sent home on Cardizem  and metoprolol.   Since that time. she had been followed periodically at Dr. Roseanne Kaufman  office.  She came in for continued care.   On talking to her, she says she has rare palpitations.  She can remember  a weekend in September, when she had of palpitations for a few hours,  took a extra beta blocker and it resolved.  She says she just feels  sluggish though.  She has had some back issues that have slowed her  down, but she just has no energy.  She can be sitting and watching TV  and fall off to sleep.   ALLERGIES:  PENICILLIN, POSSIBLY TO CRAB MEAT.   CURRENT MEDICATIONS:  Include:  1. Cardizem 60 mg b.i.d.  2. Metoprolol 25 t.i.d.  3. Lovastatin 10 daily.  4. Limbrel 500 twice a day.  5. Oxycodone 30, up to six a day/  6. Kadian 50 t.i.d.  7. Tizanidine 4 t.i.d.  8. Xanax 0.25 b.i.d.  9. Prilosec.  10.Zyrtec.  11.Aspirin 81 b.i.d.  12.Laxative 3 teaspoons a day.  13.Stool softener daily t.i.d.  14.Pure estradiol cream.  15.Voltaren gel.  16.Calcium b.i.d.  17.Black Cohosh.  18.Coenzyme Q-10.  19.Fish oil.  20.Glucosamine.  21.Milk thistle.  22.Vitamin E.  23.Magnesium.  24.Red wine extract with grapeseed.  25.B complex.  26.Selenium.   PAST MEDICAL HISTORY:  1. Atrial fibrillation.  2. GE reflux.  3. Tobacco use.  4. Back pain.  She is status post anterior diskectomy and fusion of C4-      5, C5-6.   SOCIAL HISTORY:  The patient continues to smoke about a half a pack per  day, drinks occasionally.  Does drink some caffeine.   FAMILY HISTORY:  Paternal grandfather had CAD in a 86's.  Mother died of  an AVM in the brain, had CHF near the end of life, died at 70.  Father  died at 33 of a brain aneurysm, also had cancer.  Two siblings without  heart disease known.   REVIEW OF SYSTEMS:  All systems reviewed, negative to the above problems  except as noted  above.   PHYSICAL EXAM:  VITAL SIGNS:  Exam the patient is in no distress.  Blood  pressure is 135/75 (This is high for her.  She says it is usually 98, 99  systolic.  Her tooth hurts today.  Pulse is 65 and regular, weight 160.  HEENT:  Normocephalic, atraumatic, EOMI, PERL.  Mucous membranes are  moist, has some cavities with fillings, questions some swelling in her  left lower jaw, gums.  NECK:  JVP is normal.  No thyromegaly.  No bruits.  CARDIAC:  Exam regular rate and rhythm, S1-S2.  No S3-S1, murmurs or  clicks.  LUNGS:  Clear without rales or wheezes.  ABDOMINAL:  Exam is benign.  No masses, normal bowel sounds.  EXTREMITIES:  Good distal pulses.  No lower extremity edema.  A 12-lead  EKG, sinus bradycardia, 59 beats per minute.   IMPRESSION:  1. Atrial fibrillation.  I have gotten EKGs from Hospital back in      February 2008.  It looks like atrial fibrillation.  We have talked      about the physiology of the problem.  I think she can back down to      Lopressor b.i.d., and if her pressure is still running, I could      even cut the Cardizem in half and take half  twice a day, possibly      move her just to a beta blocker, a little higher dose.  I do not      want her dizzy or too sluggish.  She will call back in 2 weeks with      a response.  2. Dyslipidemia.  She said her lipids back, and when her medications      were started, her LDL was 99, HDL of 39.  Last check in June,  LDL      was 95, HDL of 47.  I would keep her on the lovastatin for right      now.  I would like to set her up for a lipo-med panel, see what her      true risk is.  3. Tobacco.  Again, counseled her heavily on smoking cessation.  I      told her if she is to use a nicotine patch or gum, she cannot smoke      at the same time.  She said her pain medicine physician does not      want her on Chantix.   I was tentatively set to see her back later this summer, but be in touch  with her about the blood work and where to proceed.     Pricilla Riffle, MD, [facility]  Electronically Signed    PVR/MedQ  DD: 07/12/2007  DT: 07/12/2007  Job #: [id number]   cc:   Loraine Leriche L. Vear Clock, M.D.  Dalbert Mayotte, M.D.

## 2010-10-22 NOTE — Assessment & Plan Note (Signed)
[facility]                            CARDIOLOGY OFFICE NOTE   NAME:Toomey, AJAI TERHAAR                       MRN:          [id number]  DATE:08/04/2008                            DOB:          22-Feb-1951    IDENTIFICATION:  Ms. Taylor [name] is a 60 year old woman.  She has a history  of atrial fibrillation.  (Intermittent) I last saw her in May of last  year.   Since seen the patient has done well.  She denies palpitations.  No  dizziness.  No chest pain.   CURRENT MEDICINES:  1. Kadian 50 t.i.d.  2. Sertraline 100 daily.  3. Alprazolam 25 t.i.d.  4. Synthroid 50 mcg daily.  5. Premarin 30 times per week.  6. B12.  7. Multivitamin.  8. Oxycodone as needed.   PHYSICAL EXAMINATION:  GENERAL:  On exam, the patient is in no distress  at rest.  VITAL SIGNS:  Blood pressure 109/69, pulse is 74 and regular, weight is  198.  NECK:  JVP is normal.  LUNGS:  Clear without rales.  CARDIAC:  Regular rate and rhythm.  S1 and S2.  No S3.  No murmurs.  ABDOMEN:  Benign.  EXTREMITIES:  No edema.   A 12-lead EKG normal sinus rhythm 71 beats per minute.   IMPRESSION:  1. The patient is a 60 year old woman with intermittent atrial      fibrillation.  She had score of 0.  I would keep her on aspirin      therapy which again I need to confirm that she is on.  2. Dyslipidemia.  We will need to get fasting lipids from [facility]      family medicine.  3. Health care maintenance and congratulated on smoking cessation.   I will set followup for 1 year or sooner if problems develop.     Pricilla Riffle, MD, [facility]  Electronically Signed    PVR/MedQ  DD: 08/06/2008  DT: 08/06/2008  Job #: [id number]

## 2010-10-22 NOTE — Assessment & Plan Note (Signed)
[facility]                            CARDIOLOGY OFFICE NOTE   NAME:Lyness, Taylor [name]                       MRN:          [id number]  DATE:10/21/2007                            DOB:          12/20/1950    IDENTIFICATION:  Ms. Beckers is a 60 year old woman.  I last saw her  back in February.  She had a history of atrial fibrillation.  She came  in self-referred for continued care.  Refer to this for full details.   Since seen she has done well.  She denies palpitations.  Note, she  called a couple times to clarify her metoprolol dose.  This was 12.5  b.i.d..  She denies palpitations.  No chest pain.  No significant  shortness of breath.  She is involved in Pilates, yoga, and [employer].   CURRENT MEDICINES:  1. Include Cardizem 60 b.i.d.  2. Limbrel 500 twice a day.  3. Oxycodone q.6 p.r.n.  4. Tizanidine 4 mg t.i.d.  5. Zyrtec.  6. Aspirin 81.  7. Laxative 3 teaspoon a day.  8. Stool softener 3 times per day.  9. progesterone cream.  10.Lovastatin 40.  11.Calcium.  12.Black cohosh.  13.Coenzyme Q-10.  14.Glucosamine.  15.Milk thistle.  16.B-complex.  17.Selenium.  18.Morphine 60 t.i.d.  19.AcipHex.  20.Metoprolol 12.5 b.i.d.   PHYSICAL EXAM:  The patient is in no distress at rest.  Blood pressure is 124/75, pulse is 58 and regular, weight 163.  Her lungs are clear.  Cardiac exam regular rate and rhythm, S1-S2, no S3, no murmurs.  Abdomen is benign.  EXTREMITIES:  No edema.   Echocardiogram preliminary normal LV function, normal LV size.  Left  atrium is 41 mm per report. Normal RV.   IMPRESSION:  1. Intermittent atrial fibrillation on exam today.  To consolidate her      multitude like to do I would stop the Cardizem and increase his      Toprol to 25 b.i.d.  2. Health care maintenance.  I agree with lovastatin.  Last time we      increased her lovastatin 20 and will need to recheck it this summer      with a LIPOMAT panel.   Encouraged her to stay active.  Counseled on tobacco cessation.  She is  going to try Commit lozenge.   I will set to see the patient back in the winter and I will be in touch  with her with her blood work.     Pricilla Riffle, MD, [facility]  Electronically Signed    PVR/MedQ  DD: 10/21/2007  DT: 10/21/2007  Job #: [id number]   cc:   Loraine Leriche L. Vear Clock, M.D.  Dalbert Mayotte, M.D.

## 2010-10-25 NOTE — Op Note (Signed)
[facility]  Patient:    Taylor [name], Taylor [name] Visit Number: [id number] MRN: [id number]          Service Type: DSU Location: 3000 [id number] Attending Physician:  Mariam Dollar Proc. 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  PROCEDURE:  Anterior cervical diskectomies and fusion at C4-5 and C5-6 using 6 mm fibular allografts and a 40 mm Atlantis plate with six 13 mm screws.  SURGEON:  Garlon Hatchet., M.D.  FIRST ASSISTANT:  Reinaldo Meeker, M.D.  ANESTHESIA:  General endotracheal.  INDICATIONS:  The patient is a very pleasant 60 year old female who has had longstanding neck and bilateral arm pain, left greater than right that radiated down through her shoulder into her forearm as well as all fingers. She had numbness and tingling.  She failed conservative treatment with physical therapy, anti-inflammatories.  Preoperative imaging revealed severe spondylitic disease with kyphotic deformity around C4-5 and C5-6 due to degeneration and osteophytic spur formation, both anteriorly and posteriorly, and spinal cord compression.  The patient was subsequently explained the risks and benefits of surgery, and desires to proceed forward with a two level diskectomy and fusion.  DESCRIPTION OF PROCEDURE:  The patient was brought to the OR, was induced under general anesthesia, and positioned supine,  and the ________ in slight extension, and 5 pounds of halter traction.  The right side of the neck was prepped and draped in the usual sterile fashion.  Preoperative imaging localized the needle over the C5 vertebral body.  A curvilinear incision was made just off the midline to the ________ of the sternocleidomastoid on the patients right side.  Then, the superficial layer of the platysma was dissected out, and the platysma was divided longitudinally.  The _____ lens was inserted in the mastoid and the omohyoid was developed down to the prevertebral fascia.  The prevertebral fascia was dissected with the Kitners and the longus colli was identified and reflected laterally.  A needle was placed and hemostasis confirmed.  Localization of the C5-6 interspace and then the remainder of the longus colli was reflected laterally and a self-retaining retractor was placed, and then utilizing an 11 blade scalpel and pituitary rongeurs used to remove the anterior margin of the annulus.  A high speed drill was used to drill at first C4-5, the annulus and nucleus pulposus down to the posterior longitudinal ligament.  There was noted to be large osteophytic spurs coming off the C4 and C5 vertebral bodies, and removed in a piecemeal fashion with 1 mm and 2 mm Kerrison punch.  Then, the ligamentum flavum was removed in a piecemeal fashion with the proximal aspects of the C5 nerve roots bilaterally.  These were decompressed throughout their foramina, and the thecal sac was completely decompressed.  Then, Gelfoam was delivered into the C5-6 interspace.  Again, the high speed drill was used to take down the anterior  margin of the annulus and the nucleus pulposus down to the posterior longitudinal ligament was removed in a piecemeal fashion.  Long osteophyte spurs were removed from the C5 and C6 vertebral bodies, and the C6 foramina were explored, and decompressed widely.  At the end of the diskectomy, no  further compression was noted either on the thecal sac centrally or on the neuroforamina laterally.  Two 6 mm fibular allografts were inserted on the compression.  The anterior margin of the vertebral bodies were were repaired.  Large anterior osteophytes were removed with the Leksell rongeur to receive the plate.  A 40 mm Atlantis plate was selected.  Six holes were drilled, tapped, and six 13 mm screws were inserted sequentially.  The wound was copiously irrigated and meticulous hemostasis was maintained. The platysma was closed with 3-0 interrupted Vicryl.  The subcutaneous tissue was closed with running 4-0 subcuticular.  Benzoin and Steri-Strips were applied.  The patient went to the recovery room in stable condition.  At the end of the case, all needle counts and sponge counts were correct. Attending Physician:  Mariam Dollar DD:  10/22/00 TD:  10/25/00 Job: [id number]

## 2010-10-25 NOTE — Discharge Summary (Signed)
NAME:  Taylor [name], BOMBA NO.:  [id number]   MEDICAL RECORD NO.:  [id number]          PATIENT TYPE:  INP   LOCATION:  2008                         FACILITY:  [facility]   PHYSICIAN:  Ricki Rodriguez, M.D.  DATE OF BIRTH:  1951-01-03   DATE OF ADMISSION:  07/28/2006  DATE OF DISCHARGE:  07/30/2006                               DISCHARGE SUMMARY   Patient is referred by Dr. Vear Clock of [facility] and Dr.  Vida Rigger of [facility].   FINAL DIAGNOSES:  1. Atrial fibrillation.  2. Gastroesophageal reflux disease.  3. Tobacco use disorder.  4. Cervicalgia  5. Backache.  6. Chest pain.   PRINCIPAL PROCEDURE:  Left heart catheterization with selective coronary  angiography done by Dr. Orpah Cobb on July 30, 2006.   DISCHARGE MEDICATIONS:  1. Oxycodone 30 mg every four hours as directed by pain clinic.  2. Zanaflex 4 mg three times daily.  3. Fentanyl 75 mcg topical patch every third day.  4. Prilosec 20 mg one daily.  5. Enbrel over-the-counter as directed.  6. Plain Zyrtec 10 mg twice daily over-the-counter.  7. Progesterone topical cream used as directed.  8. Multivitamin one daily.  9. Calcium 600 mg two daily.  10.Cardizem extended-release 180 mg one daily.  11.Xanax 0.25 mg one twice daily.  12.Metoprolol 50 mg 1/2 tablet twice daily.  13.Aspirin 81 mg two daily.   DISCHARGE DIET:  Low-sodium heart-healthy diet.   DISCHARGE ACTIVITIES:  Patient to increase activity slowly.  No lifting,  driving, sexual activity for two days.   SPECIAL INSTRUCTIONS:  Patient to avoid any activity that causes chest  pain, shortness of breath, dizziness, sweating or excessive weakness and  she is to quit smoking as fast as she can with smoking cessation  referral and considering Chantix per her primary care.   FOLLOWUP:  By Dr. Orpah Cobb in two weeks, patient to call [phone]  for appointment, and by primary care physician, Dr. Thyra Breed, in  four weeks, patient to call [phone] for appointment.   HISTORY:  This 60 year old white female complained of palpitations  around 4:00 p.m. on the day of admission.  She was working in the  kitchen and had similar episodes in the past, but this time it lasted  long enough to cause dizziness, shortness of breath, chest tightness and  sweating spell.  Patient has lost 50 pounds in six months by Weight  [employer] program and she smokes one pack of cigarettes per day with a 30-  pack-year history.   PHYSICAL EXAMINATION:  VITALS:  Temperature 99, pulse 150 down to 80s,  respirations 12, blood pressure 119/69, height 5 feet 6 inches and  weight 155 pounds, oxygen saturation 100% on two liters.  GENERAL:  Patient is well developed, well nourished.  HEENT:  The patient is normocephalic, atraumatic with pupils equal and  reactive to light.  Extraocular movements intact.  NECK:  Supple.  LUNGS:  Clear.  HEART:  Regular rate and rhythm.  S1-S2 normal.  ABDOMEN:  Soft and nontender.  EXTREMITIES:  No edema or cyanosis.  Positive clubbing.  NEUROLOGICALLY:  Patient was alert, oriented x3.  Cranial nerves grossly  intact.   LABORATORY DATA:  Revealed normal hemoglobin, hematocrit, WBC count,  platelet count, normal INR of 1.0, normal electrolytes, BUN and  creatinine, borderline cardiac enzymes of CK-MB of 5, troponin I of  0.13.  Subsequent CK-MB went up to 7 and then came down to 5.6 and  troponin I was 0.36 and then down to 0.20.  Cholesterol level was  normal.   EKG was atrial fibrillation with a slow ventricular response and  subsequently, the EKG was normal sinus rhythm with prolonged QT.   Echocardiogram showed normal left ventricular systolic function.   HOSPITAL COURSE:  Patient was admitted to telemetry unit.  She had  slightly abnormal cardiac enzymes, she had chest pain and she underwent  cardiac catheterization that failed to show any pulmonary artery  disease.  Patient's heart  rate had improved with Cardizem and beta-  blocker use, her activity was increased and she was discharged home in  satisfactory condition with a followup by me in two weeks.      Ricki Rodriguez, M.D.  Electronically Signed     ASK/MEDQ  D:  09/17/2006  T:  09/17/2006  Job:  [id number]

## 2010-10-25 NOTE — Cardiovascular Report (Signed)
NAME:  Taylor [name], Taylor [name] NO.:  [id number]   MEDICAL RECORD NO.:  [id number]          PATIENT TYPE:  INP   LOCATION:  2008                         FACILITY:  [facility]   PHYSICIAN:  Ricki Rodriguez, M.D.  DATE OF BIRTH:  December 04, 1950   DATE OF PROCEDURE:  07/30/2006  DATE OF DISCHARGE:  07/30/2006                            CARDIAC CATHETERIZATION   PROCEDURE DONE BY:  Ricki Rodriguez, M.D.   PROCEDURE:  Left heart catheterization /angiography without left  ventricular study.   INDICATION:  This 60 year old white female had chest pain, atrial  fibrillation, and abnormal cardiac enzymes along with risk factors of  smoking.   APPROACH:  Right femoral artery using a 4-French sheath.   COMPLICATIONS:  None.   HEMODYNAMIC DATA:  The left ventricular pressure was 90 by 8, and aortic  pressure was 90 by 51.   Thirty milliliters of dye was used, if not mentioned previously.   CORONARY ANATOMY:  1. Left main coronary artery was essentially unremarkable.  2. Left anterior descending coronary artery:  The left anterior      descending artery was also unremarkable.  This diagonal branch was      also normal.  3. Left circumflex coronary artery:  The left circumflex coronary      artery was unremarkable, and its obtuse marginal branch was also      normal.  4. Right coronary artery: The right coronary artery was dominant, and      its posterior lateral branch, and posterior descending arteries      were also unremarkable.   IMPRESSION:  1. Normal coronaries.  2. Non-cardiac chest pain.   RECOMMENDATIONS:  This patient will continue medical therapy.      Ricki Rodriguez, M.D.  Electronically Signed     ASK/MEDQ  D:  07/30/2006  T:  07/31/2006  Job:  [id number]

## 2010-11-29 ENCOUNTER — Telehealth: Payer: Self-pay | Admitting: *Deleted

## 2010-11-29 NOTE — Telephone Encounter (Signed)
LMOM for call back to discuss lab work done at [facility] office from 09/06/2010

## 2010-12-02 ENCOUNTER — Telehealth: Payer: Self-pay | Admitting: Internal Medicine

## 2010-12-02 NOTE — Telephone Encounter (Signed)
Called patient back. She is not interested in starting Lipitor 10mg  at this time. She states that she has taken a statin in the past and developed muscle aches. She wants to tighten up on her diet and will have labs checked again in 6 months at [facility] and forward to Dr.Ross.

## 2010-12-02 NOTE — Telephone Encounter (Signed)
See note from 12/02/2010.

## 2010-12-02 NOTE — Telephone Encounter (Signed)
rtn jackie's call from last week

## 2010-12-04 ENCOUNTER — Encounter: Payer: Self-pay | Admitting: Internal Medicine

## 2010-12-24 ENCOUNTER — Telehealth: Payer: Self-pay | Admitting: Internal Medicine

## 2010-12-24 NOTE — Telephone Encounter (Signed)
All Cardiac faxed to [facility] [phone]  12/24/10/km

## 2011-05-21 ENCOUNTER — Telehealth: Payer: Self-pay | Admitting: Internal Medicine

## 2011-05-21 NOTE — Telephone Encounter (Signed)
flecinide and metoprolol refill needed, [facility] [phone] [facility]

## 2011-05-22 MED ORDER — FLECAINIDE ACETATE 100 MG PO TABS: 100.0000 mg | ORAL_TABLET | Freq: Two times a day (BID) | ORAL | Status: DC

## 2011-05-22 MED ORDER — METOPROLOL TARTRATE 25 MG PO TABS: 25.0000 mg | ORAL_TABLET | Freq: Two times a day (BID) | ORAL | Status: DC

## 2011-06-10 DIAGNOSIS — J189 Pneumonia, unspecified organism: Secondary | ICD-10-CM

## 2011-06-10 HISTORY — PX: BUNIONECTOMY: SHX129

## 2011-06-10 HISTORY — DX: Pneumonia, unspecified organism: J18.9

## 2011-07-03 ENCOUNTER — Encounter: Payer: Self-pay | Admitting: Internal Medicine

## 2011-09-24 ENCOUNTER — Other Ambulatory Visit: Payer: Self-pay | Admitting: Internal Medicine

## 2011-10-15 ENCOUNTER — Other Ambulatory Visit: Payer: Self-pay | Admitting: Internal Medicine

## 2011-10-20 ENCOUNTER — Ambulatory Visit: Payer: 59 | Admitting: Internal Medicine

## 2011-12-01 ENCOUNTER — Ambulatory Visit (INDEPENDENT_AMBULATORY_CARE_PROVIDER_SITE_OTHER): Payer: BC Managed Care – PPO | Admitting: Internal Medicine

## 2011-12-01 DIAGNOSIS — I4891 Unspecified atrial fibrillation: Secondary | ICD-10-CM

## 2011-12-01 NOTE — Progress Notes (Signed)
HPI Patient is a 61 year old with a history of intermittant afib, GERD, hypothyroism.  I saw her in 2012. Since seen she has done well.  She denies palpitations.  No CP. She did have some SOB when she took an exercise class, but not with usual activity.  She has not exercised much over the past several months since she is taking care of her grandchildren Notes HgbA1C is 6.2 (down from 7.2)  Allergies  Allergen Reactions  . Penicillins     Current Outpatient Prescriptions  Medication Sig Dispense Refill  . ALPRAZolam (XANAX) 0.25 MG tablet prn      . buPROPion (WELLBUTRIN XL) 150 MG 24 hr tablet Take 1 tablet by mouth BID times 48H.      . flecainide (TAMBOCOR) 100 MG tablet Take 1 tablet (100 mg total) by mouth 2 (two) times daily.  60 tablet  3  . HYDROmorphone HCl (EXALGO) 8 MG TB24 Take by mouth daily.      . metoprolol tartrate (LOPRESSOR) 25 MG tablet Take 1 tablet (25 mg total) by mouth 2 (two) times daily.  60 tablet  2  . NATURE-THROID 97.5 MG TABS Take 1 tablet by mouth daily.       Marland Kitchen oxyCODONE (ROXICODONE) 15 MG immediate release tablet Take 1 tablet by mouth as needed.      . progesterone (PROMETRIUM) 100 MG capsule Take 1 tablet by mouth Daily.      . Testosterone 1.25 GM/ACT (1%) GEL Place onto the skin as directed.        Past Medical History  Diagnosis Date  . Atrial fibrillation   . HYPERLIPIDEMIA-MIXED   . CHEST DISCOMFORT   . Palpitations     Past Surgical History  Procedure Date  . Foot surgery     Family History  Problem Relation Age of Onset  . Heart disease      History   Social History  . Marital Status: Married    Spouse Name: N/A    Number of Children: N/A  . Years of Education: N/A   Occupational History  . Not on file.   Social History Main Topics  . Smoking status: Not on file  . Smokeless tobacco: Not on file  . Alcohol Use: Not on file  . Drug Use: Not on file  . Sexually Active: Not on file   Other Topics Concern  . Not on  file   Social History Narrative  . No narrative on file    Review of Systems:  All systems reviewed.  They are negative to the above problem except as previously stated.  Vital Signs: There were no vitals taken for this visit.  Physical Exam Patient is in NAD HEENT:  Normocephalic, atraumatic. EOMI, PERRLA.  Neck: JVP is normal. No thyromegaly. No bruits.  Lungs: clear to auscultation. No rales no wheezes.  Heart: Regular rate and rhythm. Normal S1, S2. No S3.   No significant murmurs. PMI not displaced.  Abdomen:  Supple, nontender. Normal bowel sounds. No masses. No hepatomegaly.  Extremities:   Good distal pulses throughout. No lower extremity edema.  Musculoskeletal :moving all extremities.  Neuro:   alert and oriented x3.  CN II-XII grossly intact.  EKG:  SR.  69 bpm.   Assessment and Plan:  1.  Atrial fibrillation.  Doing well on flecanide.  She is on ASA 325.  I would keep her on this for now.   2.  DOE.  I think this is  mainly due to deconditioning.  Encouraged her to exercise while kids there 3.  HCM  Will review labs done at Dr. Chaya Jan office.  F/U 1 year.

## 2012-01-16 ENCOUNTER — Other Ambulatory Visit: Payer: Self-pay | Admitting: Internal Medicine

## 2012-01-16 ENCOUNTER — Encounter: Payer: Self-pay | Admitting: Internal Medicine

## 2012-05-21 ENCOUNTER — Other Ambulatory Visit: Payer: Self-pay | Admitting: Internal Medicine

## 2012-11-23 ENCOUNTER — Other Ambulatory Visit: Payer: Self-pay | Admitting: Internal Medicine

## 2013-01-06 ENCOUNTER — Encounter: Payer: Self-pay | Admitting: Internal Medicine

## 2013-01-17 ENCOUNTER — Ambulatory Visit: Payer: BC Managed Care – PPO | Admitting: Internal Medicine

## 2013-03-07 ENCOUNTER — Ambulatory Visit: Payer: BC Managed Care – PPO | Admitting: Internal Medicine

## 2013-04-15 ENCOUNTER — Ambulatory Visit: Payer: BC Managed Care – PPO | Admitting: Internal Medicine

## 2013-04-25 ENCOUNTER — Ambulatory Visit (INDEPENDENT_AMBULATORY_CARE_PROVIDER_SITE_OTHER): Payer: BC Managed Care – PPO | Admitting: Internal Medicine

## 2013-04-25 ENCOUNTER — Encounter: Payer: Self-pay | Admitting: Internal Medicine

## 2013-04-25 ENCOUNTER — Encounter (INDEPENDENT_AMBULATORY_CARE_PROVIDER_SITE_OTHER): Payer: Self-pay

## 2013-04-25 VITALS — BP 122/76 | HR 72 | Ht 66.0 in | Wt 237.0 lb

## 2013-04-25 DIAGNOSIS — E039 Hypothyroidism, unspecified: Secondary | ICD-10-CM

## 2013-04-25 DIAGNOSIS — I4891 Unspecified atrial fibrillation: Secondary | ICD-10-CM

## 2013-04-25 NOTE — Patient Instructions (Signed)
Your physician recommends that you continue on your current medications as directed. Please refer to the Current Medication list given to you today. Your physician wants you to follow-up in: 1 year with Dr. Ross.  You will receive a reminder letter in the mail two months in advance. If you don't receive a letter, please call our office to schedule the follow-up appointment.  

## 2013-04-25 NOTE — Progress Notes (Signed)
HPI Patient is a 62 year old with a history of intermittant afib, GERD, hypothyroism.  I saw her in 2013. Since seen she has done well.  She denies palpitations.  No CP. She is having some problems with allergies   Allergies  Allergen Reactions  . Penicillins     Current Outpatient Prescriptions  Medication Sig Dispense Refill  . flecainide (TAMBOCOR) 100 MG tablet Take 1 tablet (100 mg total) by mouth 2 (two) times daily.  60 tablet  11  . HYDROmorphone HCl (EXALGO) 8 MG TB24 Take by mouth daily.      . metoprolol tartrate (LOPRESSOR) 25 MG tablet Take 1 tablet (25 mg total) by mouth 2 (two) times daily.  60 tablet  11  . oxyCODONE (ROXICODONE) 15 MG immediate release tablet Take 1 tablet by mouth as needed.      . ALPRAZolam (XANAX) 0.25 MG tablet prn      . buPROPion (WELLBUTRIN XL) 150 MG 24 hr tablet Take 1 tablet by mouth BID times 48H.      Marland Kitchen NATURE-THROID 97.5 MG TABS Take 1 tablet by mouth daily.       . progesterone (PROMETRIUM) 100 MG capsule Take 1 tablet by mouth Daily.      . Testosterone 1.25 GM/ACT (1%) GEL Place onto the skin as directed.       No current facility-administered medications for this visit.    Past Medical History  Diagnosis Date  . Atrial fibrillation   . HYPERLIPIDEMIA-MIXED   . CHEST DISCOMFORT   . Palpitations     Past Surgical History  Procedure Laterality Date  . Foot surgery      Family History  Problem Relation Age of Onset  . Heart disease      History   Social History  . Marital Status: Married    Spouse Name: N/A    Number of Children: N/A  . Years of Education: N/A   Occupational History  . Not on file.   Social History Main Topics  . Smoking status: Former Smoker    Quit date: 12/22/2007  . Smokeless tobacco: Not on file  . Alcohol Use: Not on file  . Drug Use: Not on file  . Sexual Activity: Not on file   Other Topics Concern  . Not on file   Social History Narrative  . No narrative on file    Review of  Systems:  All systems reviewed.  They are negative to the above problem except as previously stated.  Vital Signs: BP 122/76  Pulse 72  Ht 5\' 6"  (1.676 m)  Wt 237 lb (107.502 kg)  BMI 38.27 kg/m2  Physical Exam Patient is in NAD HEENT:  Normocephalic, atraumatic. EOMI, PERRLA.  Neck: JVP is normal. No thyromegaly. No bruits.  Lungs: clear to auscultation. No rales no wheezes.  Heart: Regular rate and rhythm. Normal S1, S2. No S3.   No significant murmurs. PMI not displaced.  Abdomen:  Supple, nontender. Normal bowel sounds. No masses. No hepatomegaly.  Extremities:   Good distal pulses throughout. No lower extremity edema.  Musculoskeletal :moving all extremities.  Neuro:   alert and oriented x3.  CN II-XII grossly intact.  EKG:  SR.  72 bpm.   Assessment and Plan:  1.  Atrial fibrillation.  Doing well on flecanide.  She is on ASA 325.  I would keep her on this for now.  Will get labs from Dr Ria Clock.   3.  HCM  Will  review labs done at Dr. Chaya Jan office.  I encouraged her to increase her physical activity F/U 1 year.

## 2013-07-22 ENCOUNTER — Inpatient Hospital Stay (HOSPITAL_COMMUNITY)
Admission: EM | Admit: 2013-07-22 | Discharge: 2013-08-01 | DRG: 193 | Disposition: A | Discharge status: Home / Self Care | Payer: BC Managed Care – PPO | Attending: Internal Medicine | Admitting: Internal Medicine

## 2013-07-22 ENCOUNTER — Emergency Department (HOSPITAL_COMMUNITY): Payer: BC Managed Care – PPO

## 2013-07-22 ENCOUNTER — Encounter (HOSPITAL_COMMUNITY): Payer: Self-pay | Admitting: Emergency Medicine

## 2013-07-22 DIAGNOSIS — I509 Heart failure, unspecified: Secondary | ICD-10-CM | POA: Diagnosis present

## 2013-07-22 DIAGNOSIS — E119 Type 2 diabetes mellitus without complications: Secondary | ICD-10-CM | POA: Diagnosis present

## 2013-07-22 DIAGNOSIS — J9600 Acute respiratory failure, unspecified whether with hypoxia or hypercapnia: Secondary | ICD-10-CM | POA: Diagnosis present

## 2013-07-22 DIAGNOSIS — J4489 Other specified chronic obstructive pulmonary disease: Secondary | ICD-10-CM

## 2013-07-22 DIAGNOSIS — J441 Chronic obstructive pulmonary disease with (acute) exacerbation: Secondary | ICD-10-CM

## 2013-07-22 DIAGNOSIS — N390 Urinary tract infection, site not specified: Secondary | ICD-10-CM | POA: Diagnosis present

## 2013-07-22 DIAGNOSIS — J90 Pleural effusion, not elsewhere classified: Secondary | ICD-10-CM

## 2013-07-22 DIAGNOSIS — R918 Other nonspecific abnormal finding of lung field: Secondary | ICD-10-CM

## 2013-07-22 DIAGNOSIS — E785 Hyperlipidemia, unspecified: Secondary | ICD-10-CM | POA: Diagnosis present

## 2013-07-22 DIAGNOSIS — I10 Essential (primary) hypertension: Secondary | ICD-10-CM | POA: Diagnosis present

## 2013-07-22 DIAGNOSIS — E871 Hypo-osmolality and hyponatremia: Secondary | ICD-10-CM | POA: Diagnosis present

## 2013-07-22 DIAGNOSIS — I959 Hypotension, unspecified: Secondary | ICD-10-CM

## 2013-07-22 DIAGNOSIS — I2789 Other specified pulmonary heart diseases: Secondary | ICD-10-CM | POA: Diagnosis present

## 2013-07-22 DIAGNOSIS — Z87891 Personal history of nicotine dependence: Secondary | ICD-10-CM

## 2013-07-22 DIAGNOSIS — K219 Gastro-esophageal reflux disease without esophagitis: Secondary | ICD-10-CM | POA: Diagnosis present

## 2013-07-22 DIAGNOSIS — M51379 Other intervertebral disc degeneration, lumbosacral region without mention of lumbar back pain or lower extremity pain: Secondary | ICD-10-CM | POA: Diagnosis present

## 2013-07-22 DIAGNOSIS — D72829 Elevated white blood cell count, unspecified: Secondary | ICD-10-CM | POA: Diagnosis present

## 2013-07-22 DIAGNOSIS — N179 Acute kidney failure, unspecified: Secondary | ICD-10-CM | POA: Diagnosis present

## 2013-07-22 DIAGNOSIS — Z720 Tobacco use: Secondary | ICD-10-CM

## 2013-07-22 DIAGNOSIS — I4891 Unspecified atrial fibrillation: Secondary | ICD-10-CM | POA: Diagnosis present

## 2013-07-22 DIAGNOSIS — Z7982 Long term (current) use of aspirin: Secondary | ICD-10-CM

## 2013-07-22 DIAGNOSIS — R531 Weakness: Secondary | ICD-10-CM | POA: Diagnosis present

## 2013-07-22 DIAGNOSIS — J189 Pneumonia, unspecified organism: Principal | ICD-10-CM

## 2013-07-22 DIAGNOSIS — J449 Chronic obstructive pulmonary disease, unspecified: Secondary | ICD-10-CM

## 2013-07-22 DIAGNOSIS — R091 Pleurisy: Secondary | ICD-10-CM

## 2013-07-22 DIAGNOSIS — M5137 Other intervertebral disc degeneration, lumbosacral region: Secondary | ICD-10-CM | POA: Diagnosis present

## 2013-07-22 HISTORY — DX: Unspecified osteoarthritis, unspecified site: M19.90

## 2013-07-22 HISTORY — DX: Cervicalgia: M54.2

## 2013-07-22 HISTORY — DX: Cardiac arrhythmia, unspecified: I49.9

## 2013-07-22 HISTORY — DX: Dorsalgia, unspecified: M54.9

## 2013-07-22 HISTORY — DX: Inflammatory liver disease, unspecified: K75.9

## 2013-07-22 HISTORY — DX: Pneumonia, unspecified organism: J18.9

## 2013-07-22 HISTORY — DX: Anxiety disorder, unspecified: F41.9

## 2013-07-22 HISTORY — DX: Other intervertebral disc degeneration, lumbar region without mention of lumbar back pain or lower extremity pain: M51.369

## 2013-07-22 HISTORY — DX: Depression, unspecified: F32.A

## 2013-07-22 HISTORY — DX: Hypothyroidism, unspecified: E03.9

## 2013-07-22 HISTORY — DX: Unspecified chronic bronchitis: J42

## 2013-07-22 HISTORY — DX: Other cervical disc degeneration, unspecified cervical region: M50.30

## 2013-07-22 HISTORY — DX: Other chronic pain: G89.29

## 2013-07-22 HISTORY — DX: Major depressive disorder, single episode, unspecified: F32.9

## 2013-07-22 HISTORY — DX: Type 2 diabetes mellitus without complications: E11.9

## 2013-07-22 HISTORY — DX: Gastro-esophageal reflux disease without esophagitis: K21.9

## 2013-07-22 HISTORY — DX: Other intervertebral disc degeneration, lumbar region: M51.36

## 2013-07-22 LAB — CBC WITH DIFFERENTIAL/PLATELET
BASOS ABS: 0 10*3/uL (ref 0.0–0.1)
BASOS PCT: 0 % (ref 0–1)
BASOS PCT: 0 % (ref 0–1)
Basophils Absolute: 0 10*3/uL (ref 0.0–0.1)
EOS ABS: 0.2 10*3/uL (ref 0.0–0.7)
Eosinophils Absolute: 0.2 10*3/uL (ref 0.0–0.7)
Eosinophils Relative: 1 % (ref 0–5)
Eosinophils Relative: 1 % (ref 0–5)
HCT: 38.9 % (ref 36.0–46.0)
HCT: 39.3 % (ref 36.0–46.0)
Hemoglobin: 13.4 g/dL (ref 12.0–15.0)
Hemoglobin: 13.1 g/dL (ref 12.0–15.0)
LYMPHS PCT: 4 % — AB (ref 12–46)
Lymphocytes Relative: 5 % — ABNORMAL LOW (ref 12–46)
Lymphs Abs: 1.1 10*3/uL (ref 0.7–4.0)
Lymphs Abs: 0.7 10*3/uL (ref 0.7–4.0)
MCH: 32.2 pg (ref 26.0–34.0)
MCH: 31.5 pg (ref 26.0–34.0)
MCHC: 34.4 g/dL (ref 30.0–36.0)
MCHC: 33.3 g/dL (ref 30.0–36.0)
MCV: 93.5 fL (ref 78.0–100.0)
MCV: 94.5 fL (ref 78.0–100.0)
Monocytes Absolute: 1.5 10*3/uL — ABNORMAL HIGH (ref 0.1–1.0)
Monocytes Absolute: 1.2 10*3/uL — ABNORMAL HIGH (ref 0.1–1.0)
Monocytes Relative: 7 % (ref 3–12)
Monocytes Relative: 7 % (ref 3–12)
NEUTROS ABS: 15.2 10*3/uL — AB (ref 1.7–7.7)
NEUTROS PCT: 87 % — AB (ref 43–77)
NEUTROS PCT: 88 % — AB (ref 43–77)
Neutro Abs: 17.8 10*3/uL — ABNORMAL HIGH (ref 1.7–7.7)
PLATELETS: 194 10*3/uL (ref 150–400)
PLATELETS: 183 10*3/uL (ref 150–400)
RBC: 4.16 MIL/uL (ref 3.87–5.11)
RBC: 4.16 MIL/uL (ref 3.87–5.11)
RDW: 12.9 % (ref 11.5–15.5)
RDW: 13.1 % (ref 11.5–15.5)
WBC: 20.6 10*3/uL — ABNORMAL HIGH (ref 4.0–10.5)
WBC: 17.3 10*3/uL — AB (ref 4.0–10.5)

## 2013-07-22 LAB — COMPREHENSIVE METABOLIC PANEL
ALT: 29 U/L (ref 0–35)
AST: 27 U/L (ref 0–37)
Albumin: 3.2 g/dL — ABNORMAL LOW (ref 3.5–5.2)
Alkaline Phosphatase: 70 U/L (ref 39–117)
BILIRUBIN TOTAL: 0.6 mg/dL (ref 0.3–1.2)
BUN: 19 mg/dL (ref 6–23)
CHLORIDE: 87 meq/L — AB (ref 96–112)
CO2: 25 meq/L (ref 19–32)
CREATININE: 1.17 mg/dL — AB (ref 0.50–1.10)
Calcium: 9 mg/dL (ref 8.4–10.5)
GFR, EST AFRICAN AMERICAN: 57 mL/min — AB (ref >90)
GFR, EST NON AFRICAN AMERICAN: 50 mL/min — AB (ref >90)
Glucose, Bld: 85 mg/dL (ref 70–99)
Potassium: 4.6 mEq/L (ref 3.7–5.3)
Sodium: 126 mEq/L — ABNORMAL LOW (ref 137–147)
Total Protein: 6.9 g/dL (ref 6.0–8.3)

## 2013-07-22 LAB — BASIC METABOLIC PANEL
BUN: 21 mg/dL (ref 6–23)
CO2: 25 mEq/L (ref 19–32)
Calcium: 9 mg/dL (ref 8.4–10.5)
Chloride: 91 mEq/L — ABNORMAL LOW (ref 96–112)
Creatinine, Ser: 1.3 mg/dL — ABNORMAL HIGH (ref 0.50–1.10)
GFR calc Af Amer: 51 mL/min — ABNORMAL LOW (ref >90)
GFR, EST NON AFRICAN AMERICAN: 44 mL/min — AB (ref >90)
Glucose, Bld: 100 mg/dL — ABNORMAL HIGH (ref 70–99)
POTASSIUM: 4.8 meq/L (ref 3.7–5.3)
SODIUM: 130 meq/L — AB (ref 137–147)

## 2013-07-22 LAB — PROTIME-INR
INR: 1.24 (ref 0.00–1.49)
PROTHROMBIN TIME: 15.3 s — AB (ref 11.6–15.2)

## 2013-07-22 LAB — TSH: TSH: 0.931 u[IU]/mL (ref 0.350–4.500)

## 2013-07-22 LAB — TROPONIN I: Troponin I: <0.3 ng/mL (ref <0.30)

## 2013-07-22 LAB — URINE MICROSCOPIC-ADD ON

## 2013-07-22 LAB — URINALYSIS, ROUTINE W REFLEX MICROSCOPIC
BILIRUBIN URINE: NEGATIVE
Glucose, UA: NEGATIVE mg/dL
HGB URINE DIPSTICK: NEGATIVE
Ketones, ur: 15 mg/dL — AB
Nitrite: NEGATIVE
Protein, ur: NEGATIVE mg/dL
SPECIFIC GRAVITY, URINE: 1.02 (ref 1.005–1.030)
Urobilinogen, UA: 0.2 mg/dL (ref 0.0–1.0)
pH: 5 (ref 5.0–8.0)

## 2013-07-22 LAB — MAGNESIUM: MAGNESIUM: 2 mg/dL (ref 1.5–2.5)

## 2013-07-22 LAB — PHOSPHORUS: PHOSPHORUS: 2.5 mg/dL (ref 2.3–4.6)

## 2013-07-22 LAB — APTT: APTT: 41 s — AB (ref 24–37)

## 2013-07-22 MED ORDER — ONDANSETRON HCL 4 MG/2ML IJ SOLN: 4.0000 mg | Freq: Four times a day (QID) | INTRAMUSCULAR | Status: DC | PRN

## 2013-07-22 MED ORDER — SODIUM CHLORIDE 0.9 % IV SOLN: INTRAVENOUS | Status: DC

## 2013-07-22 MED ORDER — VITAMIN D3 25 MCG (1000 UNIT) PO TABS
5000.0000 [IU] | ORAL_TABLET | Freq: Every day | ORAL | Status: DC
  Administered 2013-07-22 – 2013-08-01 (×11): 5000 [IU] via ORAL
  Filled 2013-07-22 (×11): qty 5

## 2013-07-22 MED ORDER — PREGNENOLONE MICRONIZED POWD: 100.0000 mg | Freq: Every day | Status: DC

## 2013-07-22 MED ORDER — MELATONIN 1 MG PO TABS: 1.0000 mg | ORAL_TABLET | Freq: Every day | ORAL | Status: DC

## 2013-07-22 MED ORDER — ESTRADIOL 1 MG PO TABS
0.5000 mg | ORAL_TABLET | Freq: Two times a day (BID) | ORAL | Status: DC
  Administered 2013-07-22 – 2013-07-25 (×7): 0.5 mg via ORAL
  Filled 2013-07-22 (×9): qty 0.5

## 2013-07-22 MED ORDER — THYROID 60 MG PO TABS
90.0000 mg | ORAL_TABLET | Freq: Every day | ORAL | Status: DC
  Administered 2013-07-23 – 2013-08-01 (×10): 90 mg via ORAL
  Filled 2013-07-22 (×12): qty 1

## 2013-07-22 MED ORDER — TESTOSTERONE 12.5 MG/ACT (1%) TD GEL: 1.2500 g | Freq: Every day | TRANSDERMAL | Status: DC

## 2013-07-22 MED ORDER — PROMETHAZINE HCL 25 MG PO TABS: 25.0000 mg | ORAL_TABLET | Freq: Four times a day (QID) | ORAL | Status: DC | PRN

## 2013-07-22 MED ORDER — BUPROPION HCL ER (XL) 150 MG PO TB24
150.0000 mg | ORAL_TABLET | Freq: Every day | ORAL | Status: DC
  Administered 2013-07-23 – 2013-08-01 (×10): 150 mg via ORAL
  Filled 2013-07-22 (×10): qty 1

## 2013-07-22 MED ORDER — CELECOXIB 200 MG PO CAPS
200.0000 mg | ORAL_CAPSULE | Freq: Two times a day (BID) | ORAL | Status: DC
  Administered 2013-07-22 – 2013-07-28 (×10): 200 mg via ORAL
  Filled 2013-07-22 (×13): qty 1

## 2013-07-22 MED ORDER — FLUTICASONE PROPIONATE 50 MCG/ACT NA SUSP: 1.0000 | Freq: Four times a day (QID) | NASAL | Status: DC | PRN

## 2013-07-22 MED ORDER — ASPIRIN EC 81 MG PO TBEC
81.0000 mg | DELAYED_RELEASE_TABLET | Freq: Every day | ORAL | Status: DC
  Administered 2013-07-22 – 2013-08-01 (×11): 81 mg via ORAL
  Filled 2013-07-22 (×11): qty 1

## 2013-07-22 MED ORDER — ESTRADIOL ACETATE 0.45 MG PO TABS: 0.4500 mg | ORAL_TABLET | Freq: Two times a day (BID) | ORAL | Status: DC

## 2013-07-22 MED ORDER — SODIUM CHLORIDE 0.9 % IV BOLUS (SEPSIS)
500.0000 mL | Freq: Once | INTRAVENOUS | Status: AC
  Administered 2013-07-22: 500 mL via INTRAVENOUS

## 2013-07-22 MED ORDER — ACETAMINOPHEN 500 MG PO TABS
500.0000 mg | ORAL_TABLET | Freq: Four times a day (QID) | ORAL | Status: DC | PRN
  Administered 2013-07-23 – 2013-08-01 (×9): 500 mg via ORAL
  Filled 2013-07-22 (×9): qty 1

## 2013-07-22 MED ORDER — ALPRAZOLAM 0.25 MG PO TABS
0.2500 mg | ORAL_TABLET | Freq: Two times a day (BID) | ORAL | Status: DC | PRN
  Administered 2013-07-23 – 2013-08-01 (×15): 0.25 mg via ORAL
  Filled 2013-07-22 (×15): qty 1

## 2013-07-22 MED ORDER — SODIUM CHLORIDE 0.9 % IV SOLN
INTRAVENOUS | Status: DC
  Administered 2013-07-23: 100 mL/h via INTRAVENOUS
  Administered 2013-07-24 – 2013-07-27 (×5): via INTRAVENOUS

## 2013-07-22 MED ORDER — SODIUM CHLORIDE 0.9 % IV SOLN
INTRAVENOUS | Status: DC
  Administered 2013-07-22: 15:00:00 via INTRAVENOUS

## 2013-07-22 MED ORDER — ASPIRIN 81 MG PO TABS: 81.0000 mg | ORAL_TABLET | Freq: Every day | ORAL | Status: DC

## 2013-07-22 MED ORDER — PROGESTERONE MICRONIZED 100 MG PO CAPS
100.0000 mg | ORAL_CAPSULE | Freq: Every day | ORAL | Status: DC
  Administered 2013-07-23 – 2013-07-30 (×8): 100 mg via ORAL
  Filled 2013-07-22 (×11): qty 1

## 2013-07-22 MED ORDER — DHEA 25 MG PO CAPS: 25.0000 mg | ORAL_CAPSULE | Freq: Every day | ORAL | Status: DC

## 2013-07-22 MED ORDER — ONDANSETRON HCL 4 MG PO TABS: 4.0000 mg | ORAL_TABLET | Freq: Four times a day (QID) | ORAL | Status: DC | PRN

## 2013-07-22 MED ORDER — THYROID 97.5 MG PO TABS: 97.5000 mg | ORAL_TABLET | Freq: Every day | ORAL | Status: DC

## 2013-07-22 MED ORDER — HYDROMORPHONE HCL ER 8 MG PO T24A
16.0000 mg | EXTENDED_RELEASE_TABLET | Freq: Two times a day (BID) | ORAL | Status: DC
  Administered 2013-07-22 – 2013-08-01 (×20): 16 mg via ORAL
  Filled 2013-07-22 (×20): qty 2

## 2013-07-22 MED ORDER — CYCLOBENZAPRINE HCL 10 MG PO TABS
10.0000 mg | ORAL_TABLET | Freq: Every day | ORAL | Status: DC | PRN
  Administered 2013-07-22 – 2013-07-23 (×3): 10 mg via ORAL
  Filled 2013-07-22 (×3): qty 1

## 2013-07-22 MED ORDER — OXYCODONE HCL 5 MG PO TABS
15.0000 mg | ORAL_TABLET | ORAL | Status: DC | PRN
  Administered 2013-07-22 – 2013-08-01 (×42): 15 mg via ORAL
  Filled 2013-07-22 (×43): qty 3

## 2013-07-22 MED ORDER — METHYL FOLATE POWD: Freq: Every day | Status: DC

## 2013-07-22 MED ORDER — FLECAINIDE ACETATE 100 MG PO TABS
100.0000 mg | ORAL_TABLET | Freq: Two times a day (BID) | ORAL | Status: DC
  Administered 2013-07-22 – 2013-08-01 (×20): 100 mg via ORAL
  Filled 2013-07-22 (×21): qty 1

## 2013-07-22 MED ORDER — TMG (TRIMETHYLGLYCINE) 500 MG PO CAPS: 500.0000 mg | ORAL_CAPSULE | Freq: Every day | ORAL | Status: DC

## 2013-07-22 MED ORDER — PSEUDOEPHEDRINE HCL ER 120 MG PO TB12
120.0000 mg | ORAL_TABLET | Freq: Every day | ORAL | Status: DC
  Administered 2013-07-22 – 2013-08-01 (×11): 120 mg via ORAL
  Filled 2013-07-22 (×11): qty 1

## 2013-07-22 MED ORDER — TESTOSTERONE 50 MG/5GM (1%) TD GEL
1.2500 g | Freq: Every day | TRANSDERMAL | Status: DC
  Filled 2013-07-22: qty 5

## 2013-07-22 MED ORDER — CIPROFLOXACIN IN D5W 400 MG/200ML IV SOLN: 400.0000 mg | INTRAVENOUS | Status: DC

## 2013-07-22 MED ORDER — CIPROFLOXACIN IN D5W 400 MG/200ML IV SOLN
400.0000 mg | INTRAVENOUS | Status: DC
  Administered 2013-07-22 – 2013-07-23 (×2): 400 mg via INTRAVENOUS
  Filled 2013-07-22 (×3): qty 200

## 2013-07-22 MED ORDER — VITAMIN D3 125 MCG (5000 UT) PO CAPS: 5000.0000 mg | ORAL_CAPSULE | Freq: Every day | ORAL | Status: DC

## 2013-07-22 NOTE — ED Notes (Signed)
Patient ambulated in hallway. Stated she felt fine, she felt better. 3/4 of the way she stated that her legs felt weak.

## 2013-07-22 NOTE — ED Provider Notes (Signed)
CSN: [id number]     Arrival date & time 07/22/13  1051 History   First MD Initiated Contact with Patient 07/22/13 1105     Chief Complaint  Patient presents with  . Weakness      HPI Pt was seen at 1130. Per pt, c/o gradual onset and persistence of constant generalized weakness and lightheadedness that began this morning. Pt states she was walking this morning and felt an acute flair of her chronic low back "pain." States this pain radiated into her bilat LE's and made her bilat upper legs "weak." States she has had multiple episodes over the past 2 weeks of her bilat UE's "getting so weak I can't move them." EMS noted pt's BP "88/58" on their arrival to scene. Pt denies CP/palpitations, no SOB/cough, no abd pain, no N/V/D, no fevers, no injury, no incont/retention of bowel or bladder, no saddle anesthesia, no focal motor weakness, no tingling/numbness in extremities, no visual changes, no ataxia, no slurred speech, no facial droop.     Past Medical History  Diagnosis Date  . Atrial fibrillation   . HYPERLIPIDEMIA-MIXED   . CHEST DISCOMFORT   . Palpitations   . DDD (degenerative disc disease), cervical   . DDD (degenerative disc disease), lumbar   . GERD (gastroesophageal reflux disease)   . Chronic neck pain   . Chronic back pain    Past Surgical History  Procedure Laterality Date  . Foot surgery    . Neck surgery     Family History  Problem Relation Age of Onset  . Heart disease     History  Substance Use Topics  . Smoking status: Former Smoker    Quit date: 12/22/2007  . Smokeless tobacco: Not on file  . Alcohol Use: No    Review of Systems ROS: Statement: All systems negative except as marked or noted in the HPI; Constitutional: Negative for fever and chills. ; ; Eyes: Negative for eye pain, redness and discharge. ; ; ENMT: Negative for ear pain, hoarseness, nasal congestion, sinus pressure and sore throat. ; ; Cardiovascular: Negative for chest pain, palpitations,  diaphoresis, dyspnea and peripheral edema. ; ; Respiratory: Negative for cough, wheezing and stridor. ; ; Gastrointestinal: Negative for nausea, vomiting, diarrhea, abdominal pain, blood in stool, hematemesis, jaundice and rectal bleeding.; ; Genitourinary: Negative for dysuria, flank pain and hematuria. ; ; Musculoskeletal: +low back pain. Negative for neck pain. Negative for swelling and trauma.; ; Skin: Negative for pruritus, rash, abrasions, blisters, bruising and skin lesion.; ; Neuro: +generalized weakness, lightheadedness, bilat UE's weakness, bilat LE's weakness. Negative for headache and neck stiffness. Negative for altered level of consciousness , altered mental status, paresthesias, involuntary movement, seizure and syncope.      Allergies  Penicillins  Home Medications   Current Outpatient Rx  Name  Route  Sig  Dispense  Refill  . acetaminophen (TYLENOL) 500 MG tablet   Oral   Take 500 mg by mouth every 6 (six) hours as needed for headache.         . ALPRAZolam (XANAX) 0.25 MG tablet      0.25 mg 2 (two) times daily as needed for anxiety or sleep. prn         . aspirin 81 MG tablet   Oral   Take 81 mg by mouth daily.         . Betaine, Trimethylglycine, (TMG, TRIMETHYLGLYCINE,) 500 MG CAPS   Oral   Take 1 capsule by mouth daily.         Marland Kitchen  buPROPion (WELLBUTRIN XL) 150 MG 24 hr tablet   Oral   Take 1 tablet by mouth daily.          . celecoxib (CELEBREX) 200 MG capsule   Oral   Take 200 mg by mouth 2 (two) times daily.         . Cholecalciferol (VITAMIN D-3 PO)   Oral   Take 5,000 Units by mouth daily.          Marland Kitchen CINNAMON PO   Oral   Take 1 tablet by mouth daily.         . cyclobenzaprine (FLEXERIL) 10 MG tablet   Oral   Take 10 mg by mouth as needed for muscle spasms.         Marland Kitchen DHEA 25 MG CAPS   Oral   Take 25 mg by mouth daily.         Marland Kitchen estradiol (FEMTRACE) 0.45 MG tablet   Oral   Take 0.45 mg by mouth 2 (two) times daily.           . flecainide (TAMBOCOR) 100 MG tablet      Take 1 tablet (100 mg total) by mouth 2 (two) times daily.   60 tablet   11   . fluticasone (FLONASE) 50 MCG/ACT nasal spray   Each Nare   Place 1 spray into both nostrils every 6 (six) hours as needed for allergies or rhinitis.         . furosemide (LASIX) 40 MG tablet   Oral   Take 40 mg by mouth once a week.         Marland Kitchen HYDROmorphone HCl (EXALGO) 8 MG TB24   Oral   Take 8 mg by mouth 2 (two) times daily.          Marland Kitchen KRILL OIL PO   Oral   Take 1 tablet by mouth daily.         . Melatonin 1 MG TABS   Oral   Take 1 tablet by mouth at bedtime.          . metoprolol tartrate (LOPRESSOR) 25 MG tablet      Take 1 tablet (25 mg total) by mouth 2 (two) times daily.   60 tablet   11   . NATURE-THROID 97.5 MG TABS   Oral   Take 1 tablet by mouth daily.          Marland Kitchen oxyCODONE (ROXICODONE) 15 MG immediate release tablet   Oral   Take 1 tablet by mouth every 4 (four) hours as needed for pain.          . potassium chloride SA (K-DUR,KLOR-CON) 20 MEQ tablet   Oral   Take 20 mEq by mouth once a week.         . progesterone (PROMETRIUM) 100 MG capsule   Oral   Take 1 tablet by mouth Daily.         . promethazine (PHENERGAN) 25 MG tablet   Oral   Take 25 mg by mouth every 6 (six) hours as needed.         . pseudoephedrine (SUDAFED) 120 MG 12 hr tablet   Oral   Take 120 mg by mouth daily.          . Sodium Fluoride (PREVIDENT 5000 BOOSTER DT)   dental   Place onto teeth as directed.         . Testosterone 1.25 GM/ACT (1%) GEL   Transdermal  Place onto the skin as directed.         Marland Kitchen UNABLE TO FIND   Oral   Take 50 mg by mouth daily. Med Name: P-5-P         . 5-Methyltetrahydrofolate (METHYL FOLATE) POWD   Oral   Take by mouth as directed.         . Pregnenolone Micronized POWD   Does not apply   100 mg by Does not apply route daily.          BP 87/69  Pulse 65  Temp(Src) 98.3 F  (36.8 C) (Oral)  Resp 18  Ht 5\' 6"  (1.676 m)  Wt 219 lb (99.338 kg)  BMI 35.36 kg/m2  SpO2 99% Physical Exam 1135: Physical examination:  Nursing notes reviewed; Vital signs and O2 SAT reviewed;  Constitutional: Well developed, Well nourished, In no acute distress; Head:  Normocephalic, atraumatic; Eyes: EOMI, PERRL, No scleral icterus; ENMT: Mouth and pharynx normal, Mucous membranes dry; Neck: Supple, Full range of motion, No lymphadenopathy; Cardiovascular: Regular rate and rhythm, No gallop; Respiratory: Breath sounds clear & equal bilaterally, No rales, rhonchi, wheezes.  Speaking full sentences with ease, Normal respiratory effort/excursion; Chest: Nontender, Movement normal; Abdomen: Soft, Nontender, Nondistended, Normal bowel sounds; Genitourinary: No CVA tenderness; Spine:  No midline CS, TS, LS tenderness.;; Extremities: Pulses normal, No tenderness, No edema, No calf edema or asymmetry.; Neuro: AA&Ox3, Major CN grossly intact.Speech clear.  No facial droop.  No nystagmus. Grips equal. Strength 5/5 equal bilat UE's and LE's.  DTR 2/4 equal bilat UE's and LE's.  No gross sensory deficits.  Normal cerebellar testing bilat UE's (finger-nose) and LE's (heel-shin). Climbs on and off stretcher easily by herself. Gait steady.; Skin: Color normal, Warm, Dry.   ED Course  Procedures     EKG Interpretation    Date/Time:  Friday July 22 2013 11:44:37 EST Ventricular Rate:  63 PR Interval:  190 QRS Duration: 95 QT Interval:  453 QTC Calculation: 464 R Axis:   4 Text Interpretation:  Sinus rhythm Anterior infarct, age indeterminate When compared with ECG of 07/29/2006 No significant change was found Confirmed by [facility]  MD, Nunzio Cory [phone]) on 07/22/2013 12:25:01 PM            MDM  MDM Reviewed: previous chart, nursing note and vitals Reviewed previous: labs Interpretation: labs, ECG, x-ray, MRI and CT scan   Results for orders placed during the hospital encounter of  07/22/13  URINALYSIS, ROUTINE W REFLEX MICROSCOPIC      Result Value Ref Range   Color, Urine AMBER (*) YELLOW   APPearance CLOUDY (*) CLEAR   Specific Gravity, Urine 1.020  1.005 - 1.030   pH 5.0  5.0 - 8.0   Glucose, UA NEGATIVE  NEGATIVE mg/dL   Hgb urine dipstick NEGATIVE  NEGATIVE   Bilirubin Urine NEGATIVE  NEGATIVE   Ketones, ur 15 (*) NEGATIVE mg/dL   Protein, ur NEGATIVE  NEGATIVE mg/dL   Urobilinogen, UA 0.2  0.0 - 1.0 mg/dL   Nitrite NEGATIVE  NEGATIVE   Leukocytes, UA MODERATE (*) NEGATIVE  CBC WITH DIFFERENTIAL      Result Value Ref Range   WBC 20.6 (*) 4.0 - 10.5 K/uL   RBC 4.16  3.87 - 5.11 MIL/uL   Hemoglobin 13.4  12.0 - 15.0 g/dL   HCT 38.9  36.0 - 46.0 %   MCV 93.5  78.0 - 100.0 fL   MCH 32.2  26.0 - 34.0 pg   MCHC  34.4  30.0 - 36.0 g/dL   RDW 12.9  11.5 - 15.5 %   Platelets 194  150 - 400 K/uL   Neutrophils Relative % 87 (*) 43 - 77 %   Neutro Abs 17.8 (*) 1.7 - 7.7 K/uL   Lymphocytes Relative 5 (*) 12 - 46 %   Lymphs Abs 1.1  0.7 - 4.0 K/uL   Monocytes Relative 7  3 - 12 %   Monocytes Absolute 1.5 (*) 0.1 - 1.0 K/uL   Eosinophils Relative 1  0 - 5 %   Eosinophils Absolute 0.2  0.0 - 0.7 K/uL   Basophils Relative 0  0 - 1 %   Basophils Absolute 0.0  0.0 - 0.1 K/uL  BASIC METABOLIC PANEL      Result Value Ref Range   Sodium 130 (*) 137 - 147 mEq/L   Potassium 4.8  3.7 - 5.3 mEq/L   Chloride 91 (*) 96 - 112 mEq/L   CO2 25  19 - 32 mEq/L   Glucose, Bld 100 (*) 70 - 99 mg/dL   BUN 21  6 - 23 mg/dL   Creatinine, Ser 1.30 (*) 0.50 - 1.10 mg/dL   Calcium 9.0  8.4 - 10.5 mg/dL   GFR calc non Af Amer 44 (*) >90 mL/min   GFR calc Af Amer 51 (*) >90 mL/min  TROPONIN I      Result Value Ref Range   Troponin I <0.30  <0.30 ng/mL  URINE MICROSCOPIC-ADD ON      Result Value Ref Range   Squamous Epithelial / LPF MANY (*) RARE   WBC, UA 7-10  <3 WBC/hpf   Bacteria, UA MANY (*) RARE   Dg Chest 2 View 07/22/2013   CLINICAL DATA:  Weakness in the legs.   Chronic back pain.  EXAM: CHEST  2 VIEW  COMPARISON:  No priors.  FINDINGS: Lung volumes are normal. No consolidative airspace disease. Cephalization of the pulmonary vasculature, without frank pulmonary edema. Heart size is mildly enlarged. In addition, there is dilatation of the central pulmonary arteries. The patient is rotated to the right on today's exam, resulting in distortion of the mediastinal contours and reduced diagnostic sensitivity and specificity for mediastinal pathology. Atherosclerosis in the thoracic aorta.  IMPRESSION: 1. Cardiomegaly with pulmonary venous congestion, but no frank pulmonary edema at this time. 2. Atherosclerosis. 3. Dilatation of the central pulmonary arteries may suggest underlying pulmonary arterial hypertension.   Electronically Signed   By: [name] M.D.   On: 07/22/2013 12:33   Ct Head Wo Contrast 07/22/2013   CLINICAL DATA:  Lightheaded, weakness  EXAM: CT HEAD WITHOUT CONTRAST  CT CERVICAL SPINE WITHOUT CONTRAST  TECHNIQUE: Multidetector CT imaging of the head and cervical spine was performed following the standard protocol without intravenous contrast. Multiplanar CT image reconstructions of the cervical spine were also generated.  COMPARISON:  None.  FINDINGS: CT HEAD FINDINGS  There is no evidence of mass effect, midline shift or extra-axial fluid collections. There is no evidence of a space-occupying lesion or intracranial hemorrhage. There is no evidence of a cortical-based area of acute infarction.  The ventricles and sulci are appropriate for the patient's age. The basal cisterns are patent.  Visualized portions of the orbits are unremarkable. There is an air-fluid level in the right maxillary sinus.  The osseous structures are unremarkable.  CT CERVICAL SPINE FINDINGS  The alignment is anatomic. The vertebral body heights are maintained. Incidental note is made of incomplete fusion of  the posterior arch of C1 likely developmental. There is no acute  fracture. 3 mm of anterolisthesis of C2 on C3 secondary to severe right facet arthropathy. The prevertebral soft tissues are normal. The intraspinal soft tissues are not fully imaged on this examination due to poor soft tissue contrast, but there is no soft tissue gross abnormality.  There is anterior cervical fusion from C4 through C6 without hardware failure or complication with intervertebral spacer device is present. There is no significant osseous fusion across the C5-C6 disc space. There is osseous fusion across the C4-5 disc space.  There is severe right facet arthropathy at C2-3 and C3-4.  There is bilateral uncovertebral degenerative change at C4-5 resulting in foraminal stenosis. There is bilateral uncovertebral degenerative changes C5-6 resulting in bilateral foraminal stenosis.  There is a mild broad-based disc osteophyte complex at C6-7. There is bilateral foraminal stenosis at C6-7.  There is a mild broad-based disc osteophyte complex at C7-T1 and T1-T2. There is bilateral facet arthropathy at C7-T1 and T1-2.  The lung apices are clear.  IMPRESSION: 1. No acute intracranial pathology. 2. No acute osseous injury of the cervical spine. 3. Anterior cervical fusion from C4 through C6. 4. Cervical spine spondylosis as described above.   Electronically Signed   By: Kathreen Devoid   On: 07/22/2013 13:02   Ct Cervical Spine Wo Contrast 07/22/2013   CLINICAL DATA:  Lightheaded, weakness  EXAM: CT HEAD WITHOUT CONTRAST  CT CERVICAL SPINE WITHOUT CONTRAST  TECHNIQUE: Multidetector CT imaging of the head and cervical spine was performed following the standard protocol without intravenous contrast. Multiplanar CT image reconstructions of the cervical spine were also generated.  COMPARISON:  None.  FINDINGS: CT HEAD FINDINGS  There is no evidence of mass effect, midline shift or extra-axial fluid collections. There is no evidence of a space-occupying lesion or intracranial hemorrhage. There is no evidence of a  cortical-based area of acute infarction.  The ventricles and sulci are appropriate for the patient's age. The basal cisterns are patent.  Visualized portions of the orbits are unremarkable. There is an air-fluid level in the right maxillary sinus.  The osseous structures are unremarkable.  CT CERVICAL SPINE FINDINGS  The alignment is anatomic. The vertebral body heights are maintained. Incidental note is made of incomplete fusion of the posterior arch of C1 likely developmental. There is no acute fracture. 3 mm of anterolisthesis of C2 on C3 secondary to severe right facet arthropathy. The prevertebral soft tissues are normal. The intraspinal soft tissues are not fully imaged on this examination due to poor soft tissue contrast, but there is no soft tissue gross abnormality.  There is anterior cervical fusion from C4 through C6 without hardware failure or complication with intervertebral spacer device is present. There is no significant osseous fusion across the C5-C6 disc space. There is osseous fusion across the C4-5 disc space.  There is severe right facet arthropathy at C2-3 and C3-4.  There is bilateral uncovertebral degenerative change at C4-5 resulting in foraminal stenosis. There is bilateral uncovertebral degenerative changes C5-6 resulting in bilateral foraminal stenosis.  There is a mild broad-based disc osteophyte complex at C6-7. There is bilateral foraminal stenosis at C6-7.  There is a mild broad-based disc osteophyte complex at C7-T1 and T1-T2. There is bilateral facet arthropathy at C7-T1 and T1-2.  The lung apices are clear.  IMPRESSION: 1. No acute intracranial pathology. 2. No acute osseous injury of the cervical spine. 3. Anterior cervical fusion from C4 through C6. 4.  Cervical spine spondylosis as described above.   Electronically Signed   By: Kathreen Devoid   On: 07/22/2013 13:02   Mr Lumbar Spine Wo Contrast 07/22/2013   CLINICAL DATA:  Low back pain.  Lower extremity weakness.  EXAM: MRI  LUMBAR SPINE WITHOUT CONTRAST  TECHNIQUE: Multiplanar, multisequence MR imaging was performed. No intravenous contrast was administered.  COMPARISON:  CT ABD W/CM dated 07/15/2008  FINDINGS: The lowest lumbar type non-rib-bearing vertebra is labeled as L5. The conus medullaris appears normal. Conus level: L1-2.  Despite efforts by the technologist and patient, motion artifact is present on today's exam and could not be eliminated. This reduces exam sensitivity and specificity. The patient terminated the exam prior to the T1 axial series acquisition.  Multiple left renal cysts are partially visualized. Lower paraspinal muscular atrophy. 2 cm vertebral hemangioma eccentric to the left in the T11 vertebral body. There is 5 mm of anterolisthesis at L4-5 and 3 mm anterolisthesis at L3-4. Loss of disc height noted especially at L5-S1 and to a lesser extent at L4-5.  Additional findings at individual levels are as follows:  T11-12: No impingement. Bilateral facet arthropathy and diffuse disc bulge.  T12-L1: Mild left foraminal stenosis due to facet arthropathy. Diffuse disc bulge.  L1-2: No impingement. Bilateral facet arthropathy, disc bulge, and left paracentral disc protrusion.  L2-3: Borderline left foraminal stenosis due to disc bulge and facet arthropathy.  L3-4: Moderate central stenosis, mild right foraminal stenosis, and mild right subarticular lateral recess stenosis due to facet arthropathy, disc uncovering, and diffuse disc bulge.  L4-5: Borderline central stenosis and borderline right subarticular lateral recess stenosis due to disc uncovering and facet arthropathy.  L5-S1: Mild right foraminal stenosis due to facet and intervertebral spurring along with disc bulge.  IMPRESSION: 1. Lumbar spondylosis and degenerative disc disease causing moderate impingement at L3-4 and mild impingement at T12-L1 and L5-S1 as noted above. 2. The patient terminated the exam prior to the T1 axial series acquisition. Despite  efforts by the technologist and patient, motion artifact is present on today's exam and could not be eliminated. This reduces exam sensitivity and specificity.   Electronically Signed   By: Sherryl Barters M.D.   On: 07/22/2013 14:25     1140:  +orthostatic on VS; will give IVF bolus. +UTI, UC pending; will dose IV rocephin. Pt has ambulated around the ED with steady gait, though c/o "my legs feel weak." Neuro exam continues intact. MRI LS with known DDD. Dx and testing d/w pt and family.  Questions answered.  Verb understanding, agreeable to admit.  T/C to [facility] Dr. Charlies Silvers, case discussed, including:  HPI, pertinent PM/SHx, VS/PE, dx testing, ED course and treatment:  Agreeable to admit, requests to write temporary orders, obtain medical bed to team 10.   Alfonzo Feller, DO 07/23/13 1347

## 2013-07-22 NOTE — H&P (Signed)
[facility] History and Physical  Taylor [name] [id number] DOB: Dec 23, 1950 DOA: 07/22/2013  Referring physician: ED physician PCP: Raeanne Gathers, MD   Chief Complaint: weakness  HPI:  63 year old female with past medical history of atrial fibrillation, hypertension, on multiple supplements at home as listed in medication section who presented from home to [facility] ED 07/22/2013 with reports of lower extremity weakness and assoicated lightheadedness getting worse over past few weeks or so. No reports of falls.No reports of sensation loss. No reports of chest pain, shortness of breawth or palpitations. No abdominal pain, nausea or vomiting. No reports of blood in stool or urine.  In ED, BP was 82/50 then 112/72 and subsequently 89/48. HR was 52 - 69, Tmax 98.3 F and oxygen saturation was 94% on room air. Blood work revealed leukocytosis of 20.6, sodium of 130 and creatinine of 1.3. UA showed evidence of UTI. CXR showed cardiomegaly with pulmonary venous congestion but no pulmonary edema, possible PAH. CT head and CT cervical spine showed no acute intracranial findings. MRI lumbar spine showed lumbar spondylosis and degenerative disc disease causing moderate impingement at L3-4 and mild impingement at T12-L1 and L5-S1.  Assessment and Plan:  Principal Problem:   Weakness - unclear etiology, possible spondylosis evident on MRI - obtain PT eval Active Problems:   Atrial fibrillation - on flecainide; also takes metoprolol but metoprolol held due to BP of 82/50   UTI (urinary tract infection) - started cipro (PCN allergy so did not order rocephin) - follow up urine culture results   Acute renal failure - likely dehydration, UTI, lasix - give IV fluids - hold lasix - follow up BMP in am   HTN (hypertension) - hold of on antihypertensives, BP 82/50    Leukocytosis - likely due to UTI - continue cipro as above   Hyponatremia - likely due to dehydration or lasix - hold lasix due to  low BP - may use IV fluids for next 24 hours but then reassess in am  Code Status: Full Family Communication: Pt at bedside Disposition Plan: PT evaluation    Review of Systems:  Constitutional: Negative for fever, chills and malaise/fatigue. Negative for diaphoresis.  HENT: Negative for hearing loss, ear pain, nosebleeds, congestion, sore throat, neck pain, tinnitus and ear discharge.   Eyes: Negative for blurred vision, double vision, photophobia, pain, discharge and redness.  Respiratory: Negative for cough, hemoptysis, sputum production, shortness of breath, wheezing and stridor.   Cardiovascular: Negative for chest pain, palpitations, orthopnea, claudication and leg swelling.  Gastrointestinal: Negative for nausea, vomiting and abdominal pain. Negative for heartburn, constipation, blood in stool and melena.  Genitourinary: Negative for dysuria, urgency, frequency, hematuria and flank pain.  Musculoskeletal: Negative for myalgias, back pain, joint pain and falls.  Skin: Negative for itching and rash.  Neurological: positive for weakness, no tremors, no loss of consciousness, no sensory changes Endo/Heme/Allergies: Negative for environmental allergies and polydipsia. Does not bruise/bleed easily.  Psychiatric/Behavioral: Negative for suicidal ideas. The patient is not nervous/anxious.      Past Medical History  Diagnosis Date  . Atrial fibrillation   . HYPERLIPIDEMIA-MIXED   . CHEST DISCOMFORT   . Palpitations   . DDD (degenerative disc disease), cervical   . DDD (degenerative disc disease), lumbar   . GERD (gastroesophageal reflux disease)   . Chronic neck pain   . Chronic back pain     Past Surgical History  Procedure Laterality Date  . Foot surgery    . Neck  surgery      Social History:  reports that she quit smoking about 5 years ago. She does not have any smokeless tobacco history on file. She reports that she does not drink alcohol or use illicit drugs.  Allergies   Allergen Reactions  . Penicillins     Family History  Problem Relation Age of Onset  . Heart disease      Prior to Admission medications   Medication Sig Start Date End Date Taking? Authorizing Provider  5-Methyltetrahydrofolate (METHYL FOLATE) POWD Take by mouth daily.    Yes Historical Provider, MD  acetaminophen (TYLENOL) 500 MG tablet Take 500 mg by mouth every 6 (six) hours as needed for headache.   Yes Historical Provider, MD  ALPRAZolam Duanne Moron) 0.25 MG tablet Take 0.25 mg by mouth 2 (two) times daily as needed for anxiety or sleep. prn 11/29/11  Yes Historical Provider, MD  aspirin 81 MG tablet Take 81 mg by mouth daily.   Yes Historical Provider, MD  Betaine, Trimethylglycine, (TMG, TRIMETHYLGLYCINE,) 500 MG CAPS Take 500 mg by mouth daily.    Yes Historical Provider, MD  buPROPion (WELLBUTRIN XL) 150 MG 24 hr tablet Take 150 mg by mouth daily.  11/19/11  Yes Historical Provider, MD  celecoxib (CELEBREX) 200 MG capsule Take 200 mg by mouth 2 (two) times daily.   Yes Historical Provider, MD  Cholecalciferol (VITAMIN D3) 5000 UNITS CAPS Take 5,000 mg by mouth daily.   Yes Historical Provider, MD  CINNAMON PO Take 1 tablet by mouth daily.   Yes Historical Provider, MD  cyclobenzaprine (FLEXERIL) 10 MG tablet Take 10 mg by mouth daily as needed for muscle spasms.    Yes Historical Provider, MD  DHEA 25 MG CAPS Take 25 mg by mouth daily.   Yes Historical Provider, MD  estradiol (FEMTRACE) 0.45 MG tablet Take 0.45 mg by mouth 2 (two) times daily.    Yes Historical Provider, MD  flecainide (TAMBOCOR) 100 MG tablet Take 1 tablet (100 mg total) by mouth 2 (two) times daily. 11/23/12  Yes Fay Records, MD  fluticasone [facility]) 50 MCG/ACT nasal spray Place 1 spray into both nostrils every 6 (six) hours as needed for allergies or rhinitis.   Yes Historical Provider, MD  furosemide (LASIX) 40 MG tablet Take 40 mg by mouth once a week. Pt takes with potassium   Yes Historical Provider, MD   HYDROmorphone HCl (EXALGO) 8 MG TB24 Take 16 mg by mouth 2 (two) times daily.    Yes Historical Provider, MD  KRILL OIL PO Take 1 tablet by mouth daily.   Yes Historical Provider, MD  Melatonin 1 MG TABS Take 1 mg by mouth at bedtime.    Yes Historical Provider, MD  metoprolol tartrate (LOPRESSOR) 25 MG tablet Take 1 tablet (25 mg total) by mouth 2 (two) times daily. 11/23/12  Yes Fay Records, MD  NATURE-THROID 97.5 MG TABS Take 97.5 mg by mouth daily.  10/13/11  Yes Historical Provider, MD  OVER THE COUNTER MEDICATION Take 50 mg by mouth daily. Med name P-[id number]   Yes Historical Provider, MD  OVER THE COUNTER MEDICATION Place 10 drops under the tongue 3 (three) times daily. [employer] brand HCG drops   Yes Historical Provider, MD  oxyCODONE (ROXICODONE) 15 MG immediate release tablet Take 15 mg by mouth every 4 (four) hours as needed for pain.  11/10/11  Yes Historical Provider, MD  potassium chloride SA (K-DUR,KLOR-CON) 20 MEQ tablet Take 20 mEq by mouth once  a week. Pt takes with furosemide   Yes Historical Provider, MD  Pregnenolone Micronized POWD Take 100 mg by mouth daily.    Yes Historical Provider, MD  progesterone (PROMETRIUM) 100 MG capsule Take 100 mg by mouth Daily.  11/03/11  Yes Historical Provider, MD  promethazine (PHENERGAN) 25 MG tablet Take 25 mg by mouth every 6 (six) hours as needed for nausea.  04/14/13  Yes Historical Provider, MD  pseudoephedrine (SUDAFED) 120 MG 12 hr tablet Take 120 mg by mouth daily.    Yes Historical Provider, MD  Sodium Fluoride (PREVIDENT 5000 BOOSTER DT) Place onto teeth as directed.   Yes Historical Provider, MD  Testosterone 1.25 GM/ACT (1%) GEL Place 1.25 g onto the skin daily.    Yes Historical Provider, MD    Physical Exam: Filed Vitals:   07/22/13 1245 07/22/13 1309 07/22/13 1315 07/22/13 1458  BP: 92/47 87/69 92/51  89/48  Pulse: 65 65 61 69  Temp:    97.5 F (36.4 C)  TempSrc:      Resp:  18 16 16   Height:      Weight:      SpO2: 97% 99% 97%  100%    Physical Exam  Constitutional: Appears well-developed and well-nourished. No distress.  HENT: Normocephalic. External right and left ear normal. Oropharynx is clear and moist.  Eyes: Conjunctivae and EOM are normal. PERRLA, no scleral icterus.  Neck: Normal ROM. Neck supple. No JVD. No tracheal deviation. No thyromegaly.  CVS: RRR, S1/S2 +, no murmurs, no gallops, no carotid bruit.  Pulmonary: Effort and breath sounds normal, no stridor, rhonchi, wheezes, rales.  Abdominal: Soft. BS +,  no distension, tenderness, rebound or guarding.  Musculoskeletal: Normal range of motion. No edema and no tenderness.  Lymphadenopathy: No lymphadenopathy noted, cervical, inguinal. Neuro: Alert. Normal reflexes, muscle tone coordination. No cranial nerve deficit. Skin: Skin is warm and dry. No rash noted. Not diaphoretic. No erythema. No pallor.  Psychiatric: Normal mood and affect. Behavior, judgment, thought content normal.   Labs on Admission:  Basic Metabolic Panel:  Recent Labs Lab 07/22/13 1141  NA 130*  K 4.8  CL 91*  CO2 25  GLUCOSE 100*  BUN 21  CREATININE 1.30*  CALCIUM 9.0   Liver Function Tests: No results found for this basename: AST, ALT, ALKPHOS, BILITOT, PROT, ALBUMIN,  in the last 168 hours No results found for this basename: LIPASE, AMYLASE,  in the last 168 hours No results found for this basename: AMMONIA,  in the last 168 hours CBC:  Recent Labs Lab 07/22/13 1141  WBC 20.6*  NEUTROABS 17.8*  HGB 13.4  HCT 38.9  MCV 93.5  PLT 194   Cardiac Enzymes:  Recent Labs Lab 07/22/13 1141  TROPONINI <0.30   BNP: No components found with this basename: POCBNP,  CBG: No results found for this basename: GLUCAP,  in the last 168 hours  Radiological Exams on Admission: Dg Chest 2 View 07/22/2013  IMPRESSION: 1. Cardiomegaly with pulmonary venous congestion, but no frank pulmonary edema at this time. 2. Atherosclerosis. 3. Dilatation of the central pulmonary  arteries may suggest underlying pulmonary arterial hypertension.   Electronically Signed   By: [name] M.D.   On: 07/22/2013 12:33   Ct Head Wo Contrast 07/22/2013   IMPRESSION: 1. No acute intracranial pathology. 2. No acute osseous injury of the cervical spine. 3. Anterior cervical fusion from C4 through C6. 4. Cervical spine spondylosis as described above.   Electronically Signed   By:  Kathreen Devoid   On: 07/22/2013 13:02   Ct Cervical Spine Wo Contrast 07/22/2013    IMPRESSION: 1. No acute intracranial pathology. 2. No acute osseous injury of the cervical spine. 3. Anterior cervical fusion from C4 through C6. 4. Cervical spine spondylosis as described above.   Electronically Signed   By: Kathreen Devoid   On: 07/22/2013 13:02   Mr Lumbar Spine Wo Contrast 07/22/2013 IMPRESSION: 1. Lumbar spondylosis and degenerative disc disease causing moderate impingement at L3-4 and mild impingement at T12-L1 and L5-S1 as noted above. 2. The patient terminated the exam prior to the T1 axial series acquisition. Despite efforts by the technologist and patient, motion artifact is present on today's exam and could not be eliminated. This reduces exam sensitivity and specificity.   Electronically Signed   By: Sherryl Barters M.D.   On: 07/22/2013 14:25   Faye Ramsay, MD  [facility] Pager [phone]  If 7PM-7AM, please contact night-coverage [email or web address] Password [facility] 07/22/2013, 3:16 PM

## 2013-07-22 NOTE — ED Notes (Signed)
Pt present via [facility] with c/o of weakness from the knees to the hips while walking with light headedness.  Pt was normal sinus with EMS, CBG 132, and BP of 88/58, pulse of 64.  No medications administered in route.  Pt reports having a feeling that comes and goes to where she cannot move her arms, no strength, starts in her chest and works its way out x 2 weeks ago.  Pt able to transfer from stretch to bed with minimal assistance.  RA of 94%.

## 2013-07-22 NOTE — ED Notes (Addendum)
No pain medication order due to patients BP 89/48.  MD Thurnell Garbe notified and pt acknowledged.  Pt is alert, oriented and ambulatory.  Husband at bedside.

## 2013-07-22 NOTE — Progress Notes (Signed)
PHARMACIST - PHYSICIAN ORDER COMMUNICATION  CONCERNING: P&T Medication Policy on Herbal Medications  DESCRIPTION:  This patient's orders for:  Methyl Folate, Trimethylglycine, DHEA, Melatonin, Pregnenolone  have been noted.  This product(s) is classified as an "herbal" or natural product. Due to a lack of definitive safety studies or FDA approval, nonstandard manufacturing practices, plus the potential risk of unknown drug-drug interactions while on inpatient medications, the Pharmacy and Therapeutics Committee does not permit the use of "herbal" or natural products of this type within [facility].   ACTION TAKEN: The pharmacy department is unable to verify this order at this time and your patient has been informed of this safety policy. Please reevaluate patient's clinical condition at discharge and address if the herbal or natural product(s) should be resumed at that time.

## 2013-07-23 DIAGNOSIS — N179 Acute kidney failure, unspecified: Secondary | ICD-10-CM

## 2013-07-23 LAB — COMPREHENSIVE METABOLIC PANEL
ALT: 23 U/L (ref 0–35)
AST: 21 U/L (ref 0–37)
Albumin: 2.7 g/dL — ABNORMAL LOW (ref 3.5–5.2)
Alkaline Phosphatase: 89 U/L (ref 39–117)
BUN: 12 mg/dL (ref 6–23)
CO2: 22 mEq/L (ref 19–32)
Calcium: 8.5 mg/dL (ref 8.4–10.5)
Chloride: 93 mEq/L — ABNORMAL LOW (ref 96–112)
Creatinine, Ser: 1.06 mg/dL (ref 0.50–1.10)
GFR calc Af Amer: 65 mL/min — ABNORMAL LOW (ref >90)
GFR calc non Af Amer: 56 mL/min — ABNORMAL LOW (ref >90)
Glucose, Bld: 76 mg/dL (ref 70–99)
Potassium: 3.9 mEq/L (ref 3.7–5.3)
Sodium: 131 mEq/L — ABNORMAL LOW (ref 137–147)
Total Bilirubin: 0.5 mg/dL (ref 0.3–1.2)
Total Protein: 6.2 g/dL (ref 6.0–8.3)

## 2013-07-23 LAB — URINE CULTURE: Colony Count: 8000

## 2013-07-23 LAB — VITAMIN B12: Vitamin B-12: >2000 pg/mL — ABNORMAL HIGH (ref 211–911)

## 2013-07-23 LAB — GLUCOSE, CAPILLARY: Glucose-Capillary: 77 mg/dL (ref 70–99)

## 2013-07-23 LAB — CBC
HEMATOCRIT: 36.3 % (ref 36.0–46.0)
HEMOGLOBIN: 12.1 g/dL (ref 12.0–15.0)
MCH: 31.6 pg (ref 26.0–34.0)
MCHC: 33.3 g/dL (ref 30.0–36.0)
MCV: 94.8 fL (ref 78.0–100.0)
Platelets: 175 10*3/uL (ref 150–400)
RBC: 3.83 MIL/uL — ABNORMAL LOW (ref 3.87–5.11)
RDW: 13.5 % (ref 11.5–15.5)
WBC: 15.8 10*3/uL — ABNORMAL HIGH (ref 4.0–10.5)

## 2013-07-23 NOTE — Evaluation (Signed)
Physical Therapy Evaluation Patient Details Name: Taylor [name] MRN: [id number] DOB: Jan 14, 1951 Today's Date: 07/23/2013 Time: 5462-7035 PT Time Calculation (min): 13 min  PT Assessment / Plan / Recommendation History of Present Illness  63 year old female with past medical history of atrial fibrillation, hypertension, on multiple supplements at home as listed in medication section who presented from home to [facility] ED 07/22/2013 with reports of lower extremity weakness and assoicated lightheadedness getting worse over past few weeks or so. No reports of falls.No reports of sensation loss. No reports of chest pain, shortness of breawth or palpitations. No abdominal pain, nausea or vomiting. No reports of blood in stool or urine.   Clinical Impression  Pt limited during eval due to pain in R side, RN made aware. Pt is mod I with all mobility, will have assistance at home. RN made aware of pain, no further PT needs identified.    PT Assessment  Patent does not need any further PT services    Follow Up Recommendations  No PT follow up    Does the patient have the potential to tolerate intense rehabilitation      Barriers to Discharge        Equipment Recommendations  None recommended by PT    Recommendations for Other Services     Frequency      Precautions / Restrictions Restrictions Weight Bearing Restrictions: No   Pertinent Vitals/Pain C/o 8/10 R side pain with breathing, spO2 87% during gait, RN made aware      Mobility  Bed Mobility Overal bed mobility: Modified Independent Transfers Overall transfer level: Modified independent Ambulation/Gait Ambulation/Gait assistance: Modified independent (Device/Increase time) Ambulation Distance (Feet): 100 Feet Assistive device: None Gait velocity interpretation: Below normal speed for age/gender General Gait Details: pt limited in gait distance and cadence by c/o R side pain, RN aware. Pt states it hurts to breath.  spO2 87% on  room air with gait, returned to 90% after 1 minute seated rest, RN made aware    Exercises     PT Diagnosis:    PT Problem List:   PT Treatment Interventions:       PT Goals(Current goals can be found in the care plan section) Acute Rehab PT Goals PT Goal Formulation: No goals set, d/c therapy  Visit Information  Last PT Received On: 07/23/13 Assistance Needed: +1 History of Present Illness: 63 year old female with past medical history of atrial fibrillation, hypertension, on multiple supplements at home as listed in medication section who presented from home to [facility] ED 07/22/2013 with reports of lower extremity weakness and assoicated lightheadedness getting worse over past few weeks or so. No reports of falls.No reports of sensation loss. No reports of chest pain, shortness of breawth or palpitations. No abdominal pain, nausea or vomiting. No reports of blood in stool or urine.        Prior [place] expects to be discharged to:: Private residence Living Arrangements: Spouse/significant other Available Help at Discharge: Family Type of Home: House Home Access: Stairs to enter Technical brewer of Steps: 1 Entrance Stairs-Rails: None Home Layout: One level Home Equipment: None Prior Function Level of Independence: Independent Communication Communication: No difficulties    Cognition  Cognition Arousal/Alertness: Awake/alert Behavior During Therapy: WFL for tasks assessed/performed Overall Cognitive Status: Within Functional Limits for tasks assessed    Extremity/Trunk Assessment Upper Extremity Assessment Upper Extremity Assessment: Generalized weakness Lower Extremity Assessment Lower Extremity Assessment: Generalized weakness Cervical / Trunk  Assessment Cervical / Trunk Assessment:  (R lateral flexion in stance due to pain, forward flexed posture in standing)   Balance    End of Session PT - End of Session Activity Tolerance:  Patient limited by pain Patient left: in bed;with call bell/phone within reach;with family/visitor present;with nursing/sitter in room Nurse Communication: Mobility status;Patient requests pain meds  GP Functional Assessment Tool Used: clinical judgement Functional Limitation: Mobility: Walking and moving around Mobility: Walking and Moving Around Current Status [phone]): At least 1 percent but less than 20 percent impaired, limited or restricted Mobility: Walking and Moving Around Goal Status [phone]): At least 1 percent but less than 20 percent impaired, limited or restricted Mobility: Walking and Moving Around Discharge Status [phone]): At least 1 percent but less than 20 percent impaired, limited or restricted   [facility] 07/23/2013, 10:23 AM

## 2013-07-23 NOTE — Progress Notes (Signed)
[facility] PROGRESS NOTE  Taylor [name] [id number] DOB: 28-Mar-1951 DOA: 07/22/2013 PCP: Raeanne Gathers, MD  Assessment/Plan: Generalized Weakness -Presumed related to UTI and possibly viral URI. -TSH ok/B12 pending. -Seen by PT and no further needs identified.  UTI -Continue cipro pending cx data.  ARF -Resolved with IVF.  Leukocytosis -Improving. -Presumed related to UTI.  Hyponatremia -Improved with IVF.  Atrial Fibrillation -Rate controlled.  Code Status: Full Code Family Communication: Patient only  Disposition Plan: Home when ready; likely 24 hours.   Consultants:  None   Antibiotics:  Cipro   Subjective: C/o pain in her posterior ribs.  Objective: Filed Vitals:   07/22/13 2102 07/22/13 2248 07/23/13 0553 07/23/13 1349  BP: 142/55  133/45 114/56  Pulse: 82  85 67  Temp: 100.8 F (38.2 C) 99 F (37.2 C) 98.4 F (36.9 C) 98 F (36.7 C)  TempSrc: Oral Oral Oral   Resp: 18  18 18   Height:      Weight:      SpO2: 99%  93% 97%    Intake/Output Summary (Last 24 hours) at 07/23/13 1455 Last data filed at 07/23/13 1215  Gross per 24 hour  Intake   1200 ml  Output      0 ml  Net   1200 ml   Filed Weights   07/22/13 1058  Weight: 99.338 kg (219 lb)    Exam:   General:  AA Ox3  Cardiovascular: RRR  Respiratory: CTA B  Abdomen: S/NT/ND/+BS  Extremities: no C/C/E   Neurologic:  Intact, non-focal.  Data Reviewed: Basic Metabolic Panel:  Recent Labs Lab 07/22/13 1141 07/22/13 1659 07/23/13 0445  NA 130* 126* 131*  K 4.8 4.6 3.9  CL 91* 87* 93*  CO2 25 25 22   GLUCOSE 100* 85 76  BUN 21 19 12   CREATININE 1.30* 1.17* 1.06  CALCIUM 9.0 9.0 8.5  MG  --  2.0  --   PHOS  --  2.5  --    Liver Function Tests:  Recent Labs Lab 07/22/13 1659 07/23/13 0445  AST 27 21  ALT 29 23  ALKPHOS 70 89  BILITOT 0.6 0.5  PROT 6.9 6.2  ALBUMIN 3.2* 2.7*   No results found for this basename: LIPASE, AMYLASE,  in the last  168 hours No results found for this basename: AMMONIA,  in the last 168 hours CBC:  Recent Labs Lab 07/22/13 1141 07/22/13 1659 07/23/13 0445  WBC 20.6* 17.3* 15.8*  NEUTROABS 17.8* 15.2*  --   HGB 13.4 13.1 12.1  HCT 38.9 39.3 36.3  MCV 93.5 94.5 94.8  PLT 194 183 175   Cardiac Enzymes:  Recent Labs Lab 07/22/13 1141  TROPONINI <0.30   BNP (last 3 results) No results found for this basename: PROBNP,  in the last 8760 hours CBG:  Recent Labs Lab 07/23/13 0629  GLUCAP 77    Recent Results (from the past 240 hour(s))  URINE CULTURE     Status: None   Collection Time    07/22/13 12:27 PM      Result Value Ref Range Status   Specimen Description URINE, CLEAN CATCH   Final   Special Requests NONE   Final   Culture  Setup Time     Final   Value: 07/22/2013 17:12     Performed at [facility]     Final   Value: >=100,000 COLONIES/ML     Performed at [facility]  Culture     Final   Value: Multiple bacterial morphotypes present, none predominant. Suggest appropriate recollection if clinically indicated.     Performed at [facility]   Report Status 07/23/2013 FINAL   Final     Studies: Dg Chest 2 View  07/22/2013   CLINICAL DATA:  Weakness in the legs.  Chronic back pain.  EXAM: CHEST  2 VIEW  COMPARISON:  No priors.  FINDINGS: Lung volumes are normal. No consolidative airspace disease. Cephalization of the pulmonary vasculature, without frank pulmonary edema. Heart size is mildly enlarged. In addition, there is dilatation of the central pulmonary arteries. The patient is rotated to the right on today's exam, resulting in distortion of the mediastinal contours and reduced diagnostic sensitivity and specificity for mediastinal pathology. Atherosclerosis in the thoracic aorta.  IMPRESSION: 1. Cardiomegaly with pulmonary venous congestion, but no frank pulmonary edema at this time. 2. Atherosclerosis. 3. Dilatation of the central  pulmonary arteries may suggest underlying pulmonary arterial hypertension.   Electronically Signed   By: [name] M.D.   On: 07/22/2013 12:33   Ct Head Wo Contrast  07/22/2013   CLINICAL DATA:  Lightheaded, weakness  EXAM: CT HEAD WITHOUT CONTRAST  CT CERVICAL SPINE WITHOUT CONTRAST  TECHNIQUE: Multidetector CT imaging of the head and cervical spine was performed following the standard protocol without intravenous contrast. Multiplanar CT image reconstructions of the cervical spine were also generated.  COMPARISON:  None.  FINDINGS: CT HEAD FINDINGS  There is no evidence of mass effect, midline shift or extra-axial fluid collections. There is no evidence of a space-occupying lesion or intracranial hemorrhage. There is no evidence of a cortical-based area of acute infarction.  The ventricles and sulci are appropriate for the patient's age. The basal cisterns are patent.  Visualized portions of the orbits are unremarkable. There is an air-fluid level in the right maxillary sinus.  The osseous structures are unremarkable.  CT CERVICAL SPINE FINDINGS  The alignment is anatomic. The vertebral body heights are maintained. Incidental note is made of incomplete fusion of the posterior arch of C1 likely developmental. There is no acute fracture. 3 mm of anterolisthesis of C2 on C3 secondary to severe right facet arthropathy. The prevertebral soft tissues are normal. The intraspinal soft tissues are not fully imaged on this examination due to poor soft tissue contrast, but there is no soft tissue gross abnormality.  There is anterior cervical fusion from C4 through C6 without hardware failure or complication with intervertebral spacer device is present. There is no significant osseous fusion across the C5-C6 disc space. There is osseous fusion across the C4-5 disc space.  There is severe right facet arthropathy at C2-3 and C3-4.  There is bilateral uncovertebral degenerative change at C4-5 resulting in foraminal  stenosis. There is bilateral uncovertebral degenerative changes C5-6 resulting in bilateral foraminal stenosis.  There is a mild broad-based disc osteophyte complex at C6-7. There is bilateral foraminal stenosis at C6-7.  There is a mild broad-based disc osteophyte complex at C7-T1 and T1-T2. There is bilateral facet arthropathy at C7-T1 and T1-2.  The lung apices are clear.  IMPRESSION: 1. No acute intracranial pathology. 2. No acute osseous injury of the cervical spine. 3. Anterior cervical fusion from C4 through C6. 4. Cervical spine spondylosis as described above.   Electronically Signed   By: Kathreen Devoid   On: 07/22/2013 13:02   Ct Cervical Spine Wo Contrast  07/22/2013   CLINICAL DATA:  Lightheaded, weakness  EXAM: CT  HEAD WITHOUT CONTRAST  CT CERVICAL SPINE WITHOUT CONTRAST  TECHNIQUE: Multidetector CT imaging of the head and cervical spine was performed following the standard protocol without intravenous contrast. Multiplanar CT image reconstructions of the cervical spine were also generated.  COMPARISON:  None.  FINDINGS: CT HEAD FINDINGS  There is no evidence of mass effect, midline shift or extra-axial fluid collections. There is no evidence of a space-occupying lesion or intracranial hemorrhage. There is no evidence of a cortical-based area of acute infarction.  The ventricles and sulci are appropriate for the patient's age. The basal cisterns are patent.  Visualized portions of the orbits are unremarkable. There is an air-fluid level in the right maxillary sinus.  The osseous structures are unremarkable.  CT CERVICAL SPINE FINDINGS  The alignment is anatomic. The vertebral body heights are maintained. Incidental note is made of incomplete fusion of the posterior arch of C1 likely developmental. There is no acute fracture. 3 mm of anterolisthesis of C2 on C3 secondary to severe right facet arthropathy. The prevertebral soft tissues are normal. The intraspinal soft tissues are not fully imaged on this  examination due to poor soft tissue contrast, but there is no soft tissue gross abnormality.  There is anterior cervical fusion from C4 through C6 without hardware failure or complication with intervertebral spacer device is present. There is no significant osseous fusion across the C5-C6 disc space. There is osseous fusion across the C4-5 disc space.  There is severe right facet arthropathy at C2-3 and C3-4.  There is bilateral uncovertebral degenerative change at C4-5 resulting in foraminal stenosis. There is bilateral uncovertebral degenerative changes C5-6 resulting in bilateral foraminal stenosis.  There is a mild broad-based disc osteophyte complex at C6-7. There is bilateral foraminal stenosis at C6-7.  There is a mild broad-based disc osteophyte complex at C7-T1 and T1-T2. There is bilateral facet arthropathy at C7-T1 and T1-2.  The lung apices are clear.  IMPRESSION: 1. No acute intracranial pathology. 2. No acute osseous injury of the cervical spine. 3. Anterior cervical fusion from C4 through C6. 4. Cervical spine spondylosis as described above.   Electronically Signed   By: Kathreen Devoid   On: 07/22/2013 13:02   Mr Lumbar Spine Wo Contrast  07/22/2013   CLINICAL DATA:  Low back pain.  Lower extremity weakness.  EXAM: MRI LUMBAR SPINE WITHOUT CONTRAST  TECHNIQUE: Multiplanar, multisequence MR imaging was performed. No intravenous contrast was administered.  COMPARISON:  CT ABD W/CM dated 07/15/2008  FINDINGS: The lowest lumbar type non-rib-bearing vertebra is labeled as L5. The conus medullaris appears normal. Conus level: L1-2.  Despite efforts by the technologist and patient, motion artifact is present on today's exam and could not be eliminated. This reduces exam sensitivity and specificity. The patient terminated the exam prior to the T1 axial series acquisition.  Multiple left renal cysts are partially visualized. Lower paraspinal muscular atrophy. 2 cm vertebral hemangioma eccentric to the left in  the T11 vertebral body. There is 5 mm of anterolisthesis at L4-5 and 3 mm anterolisthesis at L3-4. Loss of disc height noted especially at L5-S1 and to a lesser extent at L4-5.  Additional findings at individual levels are as follows:  T11-12: No impingement. Bilateral facet arthropathy and diffuse disc bulge.  T12-L1: Mild left foraminal stenosis due to facet arthropathy. Diffuse disc bulge.  L1-2: No impingement. Bilateral facet arthropathy, disc bulge, and left paracentral disc protrusion.  L2-3: Borderline left foraminal stenosis due to disc bulge and facet arthropathy.  L3-4:  Moderate central stenosis, mild right foraminal stenosis, and mild right subarticular lateral recess stenosis due to facet arthropathy, disc uncovering, and diffuse disc bulge.  L4-5: Borderline central stenosis and borderline right subarticular lateral recess stenosis due to disc uncovering and facet arthropathy.  L5-S1: Mild right foraminal stenosis due to facet and intervertebral spurring along with disc bulge.  IMPRESSION: 1. Lumbar spondylosis and degenerative disc disease causing moderate impingement at L3-4 and mild impingement at T12-L1 and L5-S1 as noted above. 2. The patient terminated the exam prior to the T1 axial series acquisition. Despite efforts by the technologist and patient, motion artifact is present on today's exam and could not be eliminated. This reduces exam sensitivity and specificity.   Electronically Signed   By: Sherryl Barters M.D.   On: 07/22/2013 14:25    Scheduled Meds: . aspirin EC  81 mg Oral Daily  . buPROPion  150 mg Oral Daily  . celecoxib  200 mg Oral BID  . cholecalciferol  5,000 Units Oral Daily  . ciprofloxacin  400 mg Intravenous Q24H  . estradiol  0.5 mg Oral BID  . flecainide  100 mg Oral Q12H  . HYDROmorphone HCl  16 mg Oral BID  . progesterone  100 mg Oral Daily  . pseudoephedrine  120 mg Oral Daily  . testosterone  1.25 g Transdermal Daily  . thyroid  90 mg Oral QAC breakfast    Continuous Infusions: . sodium chloride 100 mL/hr at 07/22/13 1511  . sodium chloride      Principal Problem:   Weakness Active Problems:   Atrial fibrillation   UTI (urinary tract infection)   HTN (hypertension)   Leukocytosis   Hyponatremia   Acute renal failure    Time spent: 35 minutes. Greater than 50% of this time was spent in direct contact with the patient coordinating care.    Lelon Frohlich  [facility] Pager [phone]  If 7PM-7AM, please contact night-coverage at [email or web address], password [facility] 07/23/2013, 2:55 PM  LOS: 1 day

## 2013-07-24 ENCOUNTER — Inpatient Hospital Stay (HOSPITAL_COMMUNITY): Payer: BC Managed Care – PPO

## 2013-07-24 DIAGNOSIS — J4489 Other specified chronic obstructive pulmonary disease: Secondary | ICD-10-CM

## 2013-07-24 DIAGNOSIS — J189 Pneumonia, unspecified organism: Principal | ICD-10-CM

## 2013-07-24 DIAGNOSIS — J449 Chronic obstructive pulmonary disease, unspecified: Secondary | ICD-10-CM

## 2013-07-24 DIAGNOSIS — R918 Other nonspecific abnormal finding of lung field: Secondary | ICD-10-CM

## 2013-07-24 DIAGNOSIS — Z720 Tobacco use: Secondary | ICD-10-CM

## 2013-07-24 DIAGNOSIS — R222 Localized swelling, mass and lump, trunk: Secondary | ICD-10-CM

## 2013-07-24 DIAGNOSIS — J441 Chronic obstructive pulmonary disease with (acute) exacerbation: Secondary | ICD-10-CM

## 2013-07-24 DIAGNOSIS — J90 Pleural effusion, not elsewhere classified: Secondary | ICD-10-CM

## 2013-07-24 DIAGNOSIS — F17200 Nicotine dependence, unspecified, uncomplicated: Secondary | ICD-10-CM

## 2013-07-24 LAB — EXPECTORATED SPUTUM ASSESSMENT W GRAM STAIN, RFLX TO RESP C

## 2013-07-24 LAB — STREP PNEUMONIAE URINARY ANTIGEN: STREP PNEUMO URINARY ANTIGEN: NEGATIVE

## 2013-07-24 LAB — BASIC METABOLIC PANEL
BUN: 8 mg/dL (ref 6–23)
CALCIUM: 8.5 mg/dL (ref 8.4–10.5)
CHLORIDE: 100 meq/L (ref 96–112)
CO2: 26 mEq/L (ref 19–32)
Creatinine, Ser: 0.91 mg/dL (ref 0.50–1.10)
GFR calc Af Amer: 78 mL/min — ABNORMAL LOW (ref >90)
GFR calc non Af Amer: 67 mL/min — ABNORMAL LOW (ref >90)
Glucose, Bld: 125 mg/dL — ABNORMAL HIGH (ref 70–99)
Potassium: 4 mEq/L (ref 3.7–5.3)
Sodium: 138 mEq/L (ref 137–147)

## 2013-07-24 LAB — CBC
HEMATOCRIT: 38.6 % (ref 36.0–46.0)
Hemoglobin: 12.9 g/dL (ref 12.0–15.0)
MCH: 32 pg (ref 26.0–34.0)
MCHC: 33.4 g/dL (ref 30.0–36.0)
MCV: 95.8 fL (ref 78.0–100.0)
Platelets: 194 10*3/uL (ref 150–400)
RBC: 4.03 MIL/uL (ref 3.87–5.11)
RDW: 13.7 % (ref 11.5–15.5)
WBC: 16.6 10*3/uL — ABNORMAL HIGH (ref 4.0–10.5)

## 2013-07-24 LAB — PRO B NATRIURETIC PEPTIDE: Pro B Natriuretic peptide (BNP): 1142 pg/mL — ABNORMAL HIGH (ref 0–125)

## 2013-07-24 LAB — SEDIMENTATION RATE: Sed Rate: 79 mm/hr — ABNORMAL HIGH (ref 0–22)

## 2013-07-24 LAB — LACTIC ACID, PLASMA: Lactic Acid, Venous: 2 mmol/L (ref 0.5–2.2)

## 2013-07-24 LAB — EXPECTORATED SPUTUM ASSESSMENT W REFEX TO RESP CULTURE

## 2013-07-24 MED ORDER — LEVALBUTEROL HCL 0.63 MG/3ML IN NEBU
0.6300 mg | INHALATION_SOLUTION | Freq: Four times a day (QID) | RESPIRATORY_TRACT | Status: DC
  Administered 2013-07-24 – 2013-07-29 (×19): 0.63 mg via RESPIRATORY_TRACT
  Filled 2013-07-24 (×24): qty 3

## 2013-07-24 MED ORDER — LEVOFLOXACIN IN D5W 750 MG/150ML IV SOLN
750.0000 mg | INTRAVENOUS | Status: DC
  Administered 2013-07-24 – 2013-07-25 (×2): 750 mg via INTRAVENOUS
  Filled 2013-07-24 (×3): qty 150

## 2013-07-24 MED ORDER — IOHEXOL 350 MG/ML SOLN
80.0000 mL | Freq: Once | INTRAVENOUS | Status: AC | PRN
  Administered 2013-07-24: 80 mL via INTRAVENOUS

## 2013-07-24 MED ORDER — LEVALBUTEROL HCL 0.63 MG/3ML IN NEBU
0.6300 mg | INHALATION_SOLUTION | RESPIRATORY_TRACT | Status: DC | PRN
  Administered 2013-07-24 – 2013-07-27 (×2): 0.63 mg via RESPIRATORY_TRACT
  Filled 2013-07-24: qty 3

## 2013-07-24 MED ORDER — ALBUTEROL SULFATE (2.5 MG/3ML) 0.083% IN NEBU
2.5000 mg | INHALATION_SOLUTION | Freq: Four times a day (QID) | RESPIRATORY_TRACT | Status: DC | PRN
  Administered 2013-07-24: 2.5 mg via RESPIRATORY_TRACT
  Filled 2013-07-24: qty 3

## 2013-07-24 MED ORDER — DIPHENHYDRAMINE HCL 50 MG/ML IJ SOLN: 25.0000 mg | Freq: Four times a day (QID) | INTRAMUSCULAR | Status: DC | PRN

## 2013-07-24 MED ORDER — CYCLOBENZAPRINE HCL 10 MG PO TABS
10.0000 mg | ORAL_TABLET | Freq: Two times a day (BID) | ORAL | Status: DC | PRN
  Administered 2013-07-24 – 2013-08-01 (×7): 10 mg via ORAL
  Filled 2013-07-24 (×7): qty 1

## 2013-07-24 MED ORDER — LEVOFLOXACIN 750 MG PO TABS
750.0000 mg | ORAL_TABLET | Freq: Every day | ORAL | Status: DC
  Administered 2013-07-24: 750 mg via ORAL
  Filled 2013-07-24: qty 1

## 2013-07-24 NOTE — Consult Note (Signed)
PULMONARY / CRITICAL CARE MEDICINE  Name: Taylor [name] MRN: [id number] DOB: 1951/05/29    ADMISSION DATE:  07/22/2013 CONSULTATION DATE:  07/24/12   REFERRING MD :  [facility]  PRIMARY SERVICE:  [facility]   CHIEF COMPLAINT:  Abnormal CT chest   BRIEF PATIENT DESCRIPTION:  63 yo female with known hx of A Fib and HTN admitted 2/11 for progressive weakness , hypotension thought to be secondary to UTI started on IV abx. BP improved with fluids . Developed hypoxia  W/ subsequent CT chest showed multifocal  aspdz on right . Pulmonary consult on 2/15 .   SIGNIFICANT EVENTS / STUDIES:  2/15 CT chest angio >Extensive multi lobar airspace opacities within the right lung, worse within the right lower lobe. While possibly infectious in etiology, given associated mediastinal and right hilar lymphadenopathy an underlying mass is not excluded Indeterminate approximately 2.3 cm asymmetric nodule within the upper-outer quadrant of the left breast. Neg PE -suboptimal  2/13 MRI LS >Lumbar spondylosis and degenerative disc disease causing moderate  impingement at L3-4 and mild impingement at T12-L1 and L5-S1  2/13 CT head >no acute process  2/13 CT  C Spine >no acute process   LINES / TUBES:  CULTURES: 2/15 BC >> 2/15 sputum >> 2/13 UC >multiple bact   ANTIBIOTICS: Cipro 2/13 >>> 2/14  Levaquin 2/15 >>>  HISTORY OF PRESENT ILLNESS:   63 year old female with past medical history of atrial fibrillation, hypertension, on multiple supplements at home as listed in medication section who presented from home to [facility] ED 07/22/2013 with reports of lower extremity weakness and assoicated lightheadedness getting worse over past few weeks or so. No reports of falls.No reports of sensation loss. No reports of chest pain, shortness of breath or palpitations. No abdominal pain, nausea or vomiting. No reports of blood in stool or urine.  In ED, BP was 82/50 then 112/72 and subsequently 89/48. HR was 52 - 69, Tmax 98.3 F and oxygen  saturation was 94% on room air. Blood work revealed leukocytosis w/ WBC of 20.6, sodium of 130 and creatinine of 1.3. UA showed evidence of UTI. CXR showed cardiomegaly with pulmonary venous congestion but no pulmonary edema, possible PAH. CT head and CT cervical spine showed no acute intracranial findings. MRI lumbar spine showed lumbar spondylosis and degenerative disc disease causing moderate impingement at L3-4 and mild impingement at T12-L1 and L5-S1. Pt was admitted by [facility], started on IV cipro for supsected UTI  , culture returned w/ multiple bact.  Given IVF w/ improvement in b/p and scr tr down .  Pt w/ slow improvement and o2 sat drop in upper 80s.  CT chest done 2/15 for hypoxia , showed no definite PE, , extensive multilobar aspdz w/in right lung. Mediastinal and hilar adenopathy. asymemetric nodule in left brease.  Pt was started on levaquin IV abx. Pulmonary consulted to evaluate.   PAST MEDICAL HISTORY :  Past Medical History  Diagnosis Date  . Atrial fibrillation   . HYPERLIPIDEMIA-MIXED 2005    "went away after I started taking fish oil" (07/22/2013)  . CHEST DISCOMFORT   . Palpitations   . DDD (degenerative disc disease), cervical   . DDD (degenerative disc disease), lumbar   . GERD (gastroesophageal reflux disease)   . Chronic neck pain   . Chronic back pain     "started in lower back; getting to be all over my back" (07/22/2013)  . Dysrhythmia   . Chronic bronchitis     "I've  had it 2 years in a row; do have some trouble breathing at times; use an inhaler prn; not asthma" (07/22/2013)  . Hypothyroidism   . Type II diabetes mellitus     "diet controlled" (07/22/2013)  . Hepatitis 1971    "don't know if it was A or B" (07/22/2013)  . Arthritis     "hands, neck, back" (07/22/2013)  . Anxiety   . Depression   . Pneumonia 2013   Past Surgical History  Procedure Laterality Date  . Plantar fascia surgery Right 2011  . Anterior cervical decomp/discectomy fusion  ?2003     "w/plating" (07/22/2013)  . Bunionectomy Left 2013  . Dilation and curettage of uterus  1976   Prior to Admission medications   Medication Sig Start Date End Date Taking? Authorizing Provider  5-Methyltetrahydrofolate (METHYL FOLATE) POWD Take by mouth daily.    Yes Historical Provider, MD  acetaminophen (TYLENOL) 500 MG tablet Take 500 mg by mouth every 6 (six) hours as needed for headache.   Yes Historical Provider, MD  ALPRAZolam Duanne Moron) 0.25 MG tablet Take 0.25 mg by mouth 2 (two) times daily as needed for anxiety or sleep. prn 11/29/11  Yes Historical Provider, MD  aspirin 81 MG tablet Take 81 mg by mouth daily.   Yes Historical Provider, MD  Betaine, Trimethylglycine, (TMG, TRIMETHYLGLYCINE,) 500 MG CAPS Take 500 mg by mouth daily.    Yes Historical Provider, MD  buPROPion (WELLBUTRIN XL) 150 MG 24 hr tablet Take 150 mg by mouth daily.  11/19/11  Yes Historical Provider, MD  celecoxib (CELEBREX) 200 MG capsule Take 200 mg by mouth 2 (two) times daily.   Yes Historical Provider, MD  Cholecalciferol (VITAMIN D3) 5000 UNITS CAPS Take 5,000 mg by mouth daily.   Yes Historical Provider, MD  CINNAMON PO Take 1 tablet by mouth daily.   Yes Historical Provider, MD  cyclobenzaprine (FLEXERIL) 10 MG tablet Take 10 mg by mouth daily as needed for muscle spasms.    Yes Historical Provider, MD  DHEA 25 MG CAPS Take 25 mg by mouth daily.   Yes Historical Provider, MD  estradiol (FEMTRACE) 0.45 MG tablet Take 0.45 mg by mouth 2 (two) times daily.    Yes Historical Provider, MD  flecainide (TAMBOCOR) 100 MG tablet Take 1 tablet (100 mg total) by mouth 2 (two) times daily. 11/23/12  Yes Fay Records, MD  fluticasone [facility]) 50 MCG/ACT nasal spray Place 1 spray into both nostrils every 6 (six) hours as needed for allergies or rhinitis.   Yes Historical Provider, MD  furosemide (LASIX) 40 MG tablet Take 40 mg by mouth once a week. Pt takes with potassium   Yes Historical Provider, MD  HYDROmorphone HCl  (EXALGO) 8 MG TB24 Take 16 mg by mouth 2 (two) times daily.    Yes Historical Provider, MD  KRILL OIL PO Take 1 tablet by mouth daily.   Yes Historical Provider, MD  Melatonin 1 MG TABS Take 1 mg by mouth at bedtime.    Yes Historical Provider, MD  metoprolol tartrate (LOPRESSOR) 25 MG tablet Take 1 tablet (25 mg total) by mouth 2 (two) times daily. 11/23/12  Yes Fay Records, MD  NATURE-THROID 97.5 MG TABS Take 97.5 mg by mouth daily.  10/13/11  Yes Historical Provider, MD  OVER THE COUNTER MEDICATION Take 50 mg by mouth daily. Med name P-5-P   Yes Historical Provider, MD  OVER THE COUNTER MEDICATION Place 10 drops under the tongue 3 (three) times  daily. [employer] brand HCG drops   Yes Historical Provider, MD  oxyCODONE (ROXICODONE) 15 MG immediate release tablet Take 15 mg by mouth every 4 (four) hours as needed for pain.  11/10/11  Yes Historical Provider, MD  potassium chloride SA (K-DUR,KLOR-CON) 20 MEQ tablet Take 20 mEq by mouth once a week. Pt takes with furosemide   Yes Historical Provider, MD  Pregnenolone Micronized POWD Take 100 mg by mouth daily.    Yes Historical Provider, MD  progesterone (PROMETRIUM) 100 MG capsule Take 100 mg by mouth Daily.  11/03/11  Yes Historical Provider, MD  promethazine (PHENERGAN) 25 MG tablet Take 25 mg by mouth every 6 (six) hours as needed for nausea.  04/14/13  Yes Historical Provider, MD  pseudoephedrine (SUDAFED) 120 MG 12 hr tablet Take 120 mg by mouth daily.    Yes Historical Provider, MD  Sodium Fluoride (PREVIDENT 5000 BOOSTER DT) Place onto teeth as directed.   Yes Historical Provider, MD  Testosterone 1.25 GM/ACT (1%) GEL Place 1.25 g onto the skin daily.    Yes Historical Provider, MD   Allergies  Allergen Reactions  . Penicillins    FAMILY HISTORY:  Family History  Problem Relation Age of Onset  . Heart disease     SOCIAL HISTORY:  reports that she quit smoking about 5 years ago. Her smoking use included Cigarettes. She has a 40 pack-year  smoking history. She has never used smokeless tobacco. She reports that she drinks alcohol. She reports that she does not use illicit drugs.  REVIEW OF SYSTEMS:   Constitutional: Negative for  +fever, chills, weight loss, malaise/fatigue and diaphoresis.  HENT: Negative for hearing loss, ear pain, nosebleeds, congestion, sore throat, neck pain, tinnitus and ear discharge.   Eyes: Negative for blurred vision, double vision, photophobia, pain, discharge and redness.  Respiratory:+ cough, hemoptysis, sputum production, shortness of breath   Cardiovascular: Negative for   palpitations, orthopnea, claudication,   and PND.  Gastrointestinal:++heartburn,  NO nausea, vomiting, abdominal pain, diarrhea,  +constipation,  Genitourinary: Negative for dysuria, urgency, frequency, hematuria and flank pain.  Musculoskeletal: ++ myalgias, back pain, Skin: Negative for itching and rash.  Neurological: Negative for dizziness, tingling, tremors, sensory change, speech change, focal weakness, seizures, loss of consciousness, weakness and headaches.  Endo/Heme/Allergies: Negative for environmental allergies and polydipsia. Does not bruise/bleed easily.  SUBJECTIVE:  Abnormal CT consult   VITAL SIGNS: Temp:  [98 F (36.7 C)-99 F (37.2 C)] 98.2 F (36.8 C) (02/15 0613) Pulse Rate:  [67-79] 77 (02/15 0613) Resp:  [16-22] 18 (02/15 0613) BP: (114-119)/(50-58) 118/58 mmHg (02/15 0613) SpO2:  [88 %-97 %] 95 % (02/15 0613) Weight:  [104.781 kg (231 lb)] 104.781 kg (231 lb) (02/15 0500)  PHYSICAL EXAMINATION: General:  NAD , obese WF  Neuro:  A/o x 3  HEENT:  Dry mucosa  Neck:  Soft, nt , no adenopathy noted.  Cardiovascular: RRR , no m/r/g  Lungs:  Course Rhonchi on right , rattling cough Abdomen: obese , distended , BS +  Musculoskeletal:  Intact , equal strength of UE/LE  Skin:  Intact w/o rash    Recent Labs Lab 07/22/13 1659 07/23/13 0445 07/24/13 0545  NA 126* 131* 138  K 4.6 3.9 4.0  CL  87* 93* 100  CO2 25 22 26   BUN 19 12 8   CREATININE 1.17* 1.06 0.91  GLUCOSE 85 76 125*    Recent Labs Lab 07/22/13 1659 07/23/13 0445 07/24/13 0545  HGB 13.1 12.1 12.9  HCT  39.3 36.3 38.6  WBC 17.3* 15.8* 16.6*  PLT 183 175 194   Ct Head Wo Contrast  07/22/2013   CLINICAL DATA:  Lightheaded, weakness  EXAM: CT HEAD WITHOUT CONTRAST  CT CERVICAL SPINE WITHOUT CONTRAST  TECHNIQUE: Multidetector CT imaging of the head and cervical spine was performed following the standard protocol without intravenous contrast. Multiplanar CT image reconstructions of the cervical spine were also generated.  COMPARISON:  None.  FINDINGS: CT HEAD FINDINGS  There is no evidence of mass effect, midline shift or extra-axial fluid collections. There is no evidence of a space-occupying lesion or intracranial hemorrhage. There is no evidence of a cortical-based area of acute infarction.  The ventricles and sulci are appropriate for the patient's age. The basal cisterns are patent.  Visualized portions of the orbits are unremarkable. There is an air-fluid level in the right maxillary sinus.  The osseous structures are unremarkable.  CT CERVICAL SPINE FINDINGS  The alignment is anatomic. The vertebral body heights are maintained. Incidental note is made of incomplete fusion of the posterior arch of C1 likely developmental. There is no acute fracture. 3 mm of anterolisthesis of C2 on C3 secondary to severe right facet arthropathy. The prevertebral soft tissues are normal. The intraspinal soft tissues are not fully imaged on this examination due to poor soft tissue contrast, but there is no soft tissue gross abnormality.  There is anterior cervical fusion from C4 through C6 without hardware failure or complication with intervertebral spacer device is present. There is no significant osseous fusion across the C5-C6 disc space. There is osseous fusion across the C4-5 disc space.  There is severe right facet arthropathy at C2-3 and  C3-4.  There is bilateral uncovertebral degenerative change at C4-5 resulting in foraminal stenosis. There is bilateral uncovertebral degenerative changes C5-6 resulting in bilateral foraminal stenosis.  There is a mild broad-based disc osteophyte complex at C6-7. There is bilateral foraminal stenosis at C6-7.  There is a mild broad-based disc osteophyte complex at C7-T1 and T1-T2. There is bilateral facet arthropathy at C7-T1 and T1-2.  The lung apices are clear.  IMPRESSION: 1. No acute intracranial pathology. 2. No acute osseous injury of the cervical spine. 3. Anterior cervical fusion from C4 through C6. 4. Cervical spine spondylosis as described above.   Electronically Signed   By: Kathreen Devoid   On: 07/22/2013 13:02   Ct Angio Chest Pe W/cm &/or Wo Cm  07/24/2013   CLINICAL DATA:  Chest pain, shortness of breath, hypoxia, history AFib and pneumonia, evaluate for pulmonary embolism  EXAM: CT ANGIOGRAPHY CHEST WITH CONTRAST  TECHNIQUE: Multidetector CT imaging of the chest was performed using the standard protocol during bolus administration of intravenous contrast. Multiplanar CT image reconstructions and MIPs were obtained to evaluate the vascular anatomy.  CONTRAST:  24mL OMNIPAQUE IOHEXOL 350 MG/ML SOLN  COMPARISON:  DG CHEST 2 VIEW dated 07/22/2013; CT ABD W/CM dated 07/15/2008  FINDINGS: Vascular Findings:  There is suboptimal opacification of the pulmonary arterial system with the main pulmonary artery measuring only 211 Hounsfield units. Given this limitation, there are no discrete filling defects within the pulmonary arterial tree the suggest pulmonary embolism.  Cardiomegaly. Trace amount of pericardial fluid, presumably physiologic. There is scattered minimal amount of atherosclerotic plaque within a normal caliber thoracic aorta. Conventional configuration of the aortic arch. The branch vessels of the aortic arch are widely patent throughout their imaged course. No definite thoracic aortic dissection  or peri aortic stranding.  Review of the  MIP images confirms the above findings.   ----------------------------------------------------------------------------------  Nonvascular Findings:  There is are consolidative airspace opacities involving the majority of the right lower lobe with an additional smaller areas of consolidation within the right upper (image 28, series 6) and middle lobe (image 47, series 4). Air bronchograms are noted within many of these consolidative airspace opacities. There are ill-defined areas of ground-glass an apparent slightly nodular interlobular septal thickening within the aerated portions of the right lung. These findings are associated with a small amount of loculated pleural fluid within the right lung apex about the right lateral aspect of the pleural space as well as adjacent to the dependent portion of the right lower lobe. No discrete focal airspace opacities identified within the contralateral left lung.  Mediastinal and right hilar lymphadenopathy with index right-sided paratracheal lymph node measuring approximately 1.2 cm in greatest short axis diameter (image 15, series 4), index right suprahilar nodal conglomeration measuring approximately 1.8 cm (image 28) and right infrahilar nodal conglomeration measuring 1.6 cm (image 40). No definite hilar or axillary lymphadenopathy.  Scattered right-sided granulomas including a punctate (approximately 4 mm) granuloma within the right lung apex (image 13, series 6) with associated right hilar partially calcified lymph nodes, the sequela of prior granulomatous infection.  Limited early arterial phase evaluation of the upper abdomen demonstrates the cranial aspect of the previously characterized exophytic cyst arising from the superior pole of the right kidney. Splenic granulomas.  No acute or aggressive osseous abnormalities. Old/healed fracture involving the anterior aspect of the left third rib.  Note is made of an asymmetric  approximately 2.3 x 1.7 cm opacity within in the upper outer quadrant of left breast (image 28, series 4).  IMPRESSION: 1. Extensive multi lobar airspace opacities within the right lung, worse within the right lower lobe. While possibly infectious in etiology, given associated mediastinal and right hilar lymphadenopathy an underlying mass is not excluded. As such, a follow-up chest radiograph in 4-6 weeks after treatment is recommended to ensure resolution. Alternatively, further evaluation could be performed with bronchoscopy as clinically indicated. 2. Sequela of prior granulomatous infection as above. 3. Indeterminate approximately 2.3 cm asymmetric nodule within the upper-outer quadrant of the left breast. Further evaluation with physical examination and diagnostic mammogram (if not recently performed) is recommended. Critical Value/emergent results were called by telephone at the time of interpretation on 07/24/2013 at 9:23 AM to Dr. Lelon Frohlich , who verbally acknowledged these results.   Electronically Signed   By: Sandi Mariscal M.D.   On: 07/24/2013 09:43   Ct Cervical Spine Wo Contrast  07/22/2013   CLINICAL DATA:  Lightheaded, weakness  EXAM: CT HEAD WITHOUT CONTRAST  CT CERVICAL SPINE WITHOUT CONTRAST  TECHNIQUE: Multidetector CT imaging of the head and cervical spine was performed following the standard protocol without intravenous contrast. Multiplanar CT image reconstructions of the cervical spine were also generated.  COMPARISON:  None.  FINDINGS: CT HEAD FINDINGS  There is no evidence of mass effect, midline shift or extra-axial fluid collections. There is no evidence of a space-occupying lesion or intracranial hemorrhage. There is no evidence of a cortical-based area of acute infarction.  The ventricles and sulci are appropriate for the patient's age. The basal cisterns are patent.  Visualized portions of the orbits are unremarkable. There is an air-fluid level in the right maxillary  sinus.  The osseous structures are unremarkable.  CT CERVICAL SPINE FINDINGS  The alignment is anatomic. The vertebral body heights are maintained. Incidental note  is made of incomplete fusion of the posterior arch of C1 likely developmental. There is no acute fracture. 3 mm of anterolisthesis of C2 on C3 secondary to severe right facet arthropathy. The prevertebral soft tissues are normal. The intraspinal soft tissues are not fully imaged on this examination due to poor soft tissue contrast, but there is no soft tissue gross abnormality.  There is anterior cervical fusion from C4 through C6 without hardware failure or complication with intervertebral spacer device is present. There is no significant osseous fusion across the C5-C6 disc space. There is osseous fusion across the C4-5 disc space.  There is severe right facet arthropathy at C2-3 and C3-4.  There is bilateral uncovertebral degenerative change at C4-5 resulting in foraminal stenosis. There is bilateral uncovertebral degenerative changes C5-6 resulting in bilateral foraminal stenosis.  There is a mild broad-based disc osteophyte complex at C6-7. There is bilateral foraminal stenosis at C6-7.  There is a mild broad-based disc osteophyte complex at C7-T1 and T1-T2. There is bilateral facet arthropathy at C7-T1 and T1-2.  The lung apices are clear.  IMPRESSION: 1. No acute intracranial pathology. 2. No acute osseous injury of the cervical spine. 3. Anterior cervical fusion from C4 through C6. 4. Cervical spine spondylosis as described above.   Electronically Signed   By: Kathreen Devoid   On: 07/22/2013 13:02   Mr Lumbar Spine Wo Contrast  07/22/2013   CLINICAL DATA:  Low back pain.  Lower extremity weakness.  EXAM: MRI LUMBAR SPINE WITHOUT CONTRAST  TECHNIQUE: Multiplanar, multisequence MR imaging was performed. No intravenous contrast was administered.  COMPARISON:  CT ABD W/CM dated 07/15/2008  FINDINGS: The lowest lumbar type non-rib-bearing vertebra is  labeled as L5. The conus medullaris appears normal. Conus level: L1-2.  Despite efforts by the technologist and patient, motion artifact is present on today's exam and could not be eliminated. This reduces exam sensitivity and specificity. The patient terminated the exam prior to the T1 axial series acquisition.  Multiple left renal cysts are partially visualized. Lower paraspinal muscular atrophy. 2 cm vertebral hemangioma eccentric to the left in the T11 vertebral body. There is 5 mm of anterolisthesis at L4-5 and 3 mm anterolisthesis at L3-4. Loss of disc height noted especially at L5-S1 and to a lesser extent at L4-5.  Additional findings at individual levels are as follows:  T11-12: No impingement. Bilateral facet arthropathy and diffuse disc bulge.  T12-L1: Mild left foraminal stenosis due to facet arthropathy. Diffuse disc bulge.  L1-2: No impingement. Bilateral facet arthropathy, disc bulge, and left paracentral disc protrusion.  L2-3: Borderline left foraminal stenosis due to disc bulge and facet arthropathy.  L3-4: Moderate central stenosis, mild right foraminal stenosis, and mild right subarticular lateral recess stenosis due to facet arthropathy, disc uncovering, and diffuse disc bulge.  L4-5: Borderline central stenosis and borderline right subarticular lateral recess stenosis due to disc uncovering and facet arthropathy.  L5-S1: Mild right foraminal stenosis due to facet and intervertebral spurring along with disc bulge.  IMPRESSION: 1. Lumbar spondylosis and degenerative disc disease causing moderate impingement at L3-4 and mild impingement at T12-L1 and L5-S1 as noted above. 2. The patient terminated the exam prior to the T1 axial series acquisition. Despite efforts by the technologist and patient, motion artifact is present on today's exam and could not be eliminated. This reduces exam sensitivity and specificity.   Electronically Signed   By: Sherryl Barters M.D.   On: 07/22/2013 14:25    ASSESSMENT / PLAN:  History of tobacco use Suspect COPD Likely CAP vs aspiration pneumonia ( chronic pain or opioids and muscle relaxants ) vs postobstructive pneumonia Possible lung mass Mediastinal / hilar lymphadenopathy, reactive vs secondary to malignancy Small loculated pleural effusion Possible breast mass  -->   The absence of a discrete mass makes primary lesion biopsy challenging.  Pathological diagnosis may be potentially obtained by  flexible bronchoscopy / bronchoalveolar lavage (BAL) with or without endobronchial ultrasound ( EBUS ) and lymph node biopsy.  Bronchoscopy  / BAL can be performed under conscious sedation, while EBUS will require general anesthesia.  Alternatively,  empirical treatment of pneumonia with short term ( 4-6 weeks ) CT follow-up can be a reasonable option.  The patient would conside  this options and let us know. -->   Supplemental oxygen for SpO2>92 --> Xopenex -->       No indication for systemic steroids --> Follow sputum culture / urine Strep / Legionella Ag / viral panel --> Change Levaquin to IV -->       Thoracentesis is not indicated -->   Pain control -->   PFT as outpatient -->  Breast US as outpatient  PARRETT,TAMMY NP-C  Pulmonary and [place] Pager: [phone]  07/24/2013, 12:40 PM  I have personally obtained history, examined patient, evaluated and interpreted laboratory and imaging results, reviewed medical records, formulated assessment / plan and placed orders.  Doree Fudge, MD Pulmonary and [place] Pager: [phone]  07/24/2013, 5:58 PM

## 2013-07-24 NOTE — Progress Notes (Signed)
Pt continues to have a weak congested cough and is having difficulty coughing because of her right rib pain. Her sats are 88% on room air and 92% on 2liters oxygen. Incentive spirometer decreased from 500 cc to 250 cc with increased rib pain. Hospitalist notified and nebs ordered. Pain med given and encouraged continued use of I.S.

## 2013-07-24 NOTE — Progress Notes (Signed)
ANTIBIOTIC CONSULT NOTE - INITIAL  Pharmacy Consult for Levofloxacin Indication: pneumonia / ?UTI  Allergies  Allergen Reactions  . Penicillins     Patient Measurements: Height: 5\' 6"  (167.6 cm) Weight: 231 lb (104.781 kg) IBW/kg (Calculated) : 59.3 Adjusted Body Weight: n/a  Vital Signs: Temp: 98.2 F (36.8 C) (02/15 0613) Temp src: Oral (02/15 0613) BP: 118/58 mmHg (02/15 0613) Pulse Rate: 77 (02/15 0613) Intake/Output from previous day: 02/14 0701 - 02/15 0700 In: 3225 [P.O.:1800; I.V.:1425] Out: -  Intake/Output from this shift: Total I/O In: 120 [P.O.:120] Out: -   Labs:  Recent Labs  07/22/13 1659 07/23/13 0445 07/24/13 0545  WBC 17.3* 15.8* 16.6*  HGB 13.1 12.1 12.9  PLT 183 175 194  CREATININE 1.17* 1.06 0.91   Estimated Creatinine Clearance: 80.4 ml/min (by C-G formula based on Cr of 0.91). No results found for this basename: VANCOTROUGH, Corlis Leak, VANCORANDOM, GENTTROUGH, GENTPEAK, GENTRANDOM, TOBRATROUGH, TOBRAPEAK, TOBRARND, AMIKACINPEAK, AMIKACINTROU, AMIKACIN,  in the last 72 hours   Microbiology: Recent Results (from the past 720 hour(s))  URINE CULTURE     Status: None   Collection Time    07/22/13 12:27 PM      Result Value Ref Range Status   Specimen Description URINE, CLEAN CATCH   Final   Special Requests NONE   Final   Culture  Setup Time     Final   Value: 07/22/2013 17:12     Performed at [place]     Final   Value: >=100,000 COLONIES/ML     Performed at [facility]   Culture     Final   Value: Multiple bacterial morphotypes present, none predominant. Suggest appropriate recollection if clinically indicated.     Performed at [facility]   Report Status 07/23/2013 FINAL   Final  URINE CULTURE     Status: None   Collection Time    07/22/13  9:35 PM      Result Value Ref Range Status   Specimen Description URINE, CLEAN CATCH   Final   Special Requests NONE   Final   Culture  Setup  Time     Final   Value: 07/22/2013 21:59     Performed at [facility]     Final   Value: 8,000 COLONIES/ML     Performed at [facility]   Culture     Final   Value: INSIGNIFICANT GROWTH     Performed at [facility]   Report Status 07/23/2013 FINAL   Final    Medical History: Past Medical History  Diagnosis Date  . Atrial fibrillation   . HYPERLIPIDEMIA-MIXED 2005    "went away after I started taking fish oil" (07/22/2013)  . CHEST DISCOMFORT   . Palpitations   . DDD (degenerative disc disease), cervical   . DDD (degenerative disc disease), lumbar   . GERD (gastroesophageal reflux disease)   . Chronic neck pain   . Chronic back pain     "started in lower back; getting to be all over my back" (07/22/2013)  . Dysrhythmia   . Chronic bronchitis     "I've had it 2 years in a row; do have some trouble breathing at times; use an inhaler prn; not asthma" (07/22/2013)  . Hypothyroidism   . Type II diabetes mellitus     "diet controlled" (07/22/2013)  . Hepatitis 1971    "don't know if it was A or  B" (07/22/2013)  . Arthritis     "hands, neck, back" (07/22/2013)  . Anxiety   . Depression   . Pneumonia 2013    Medications:  Prescriptions prior to admission  Medication Sig Dispense Refill  . 5-Methyltetrahydrofolate (METHYL FOLATE) POWD Take by mouth daily.       Marland Kitchen acetaminophen (TYLENOL) 500 MG tablet Take 500 mg by mouth every 6 (six) hours as needed for headache.      . ALPRAZolam (XANAX) 0.25 MG tablet Take 0.25 mg by mouth 2 (two) times daily as needed for anxiety or sleep. prn      . aspirin 81 MG tablet Take 81 mg by mouth daily.      . Betaine, Trimethylglycine, (TMG, TRIMETHYLGLYCINE,) 500 MG CAPS Take 500 mg by mouth daily.       Marland Kitchen buPROPion (WELLBUTRIN XL) 150 MG 24 hr tablet Take 150 mg by mouth daily.       . celecoxib (CELEBREX) 200 MG capsule Take 200 mg by mouth 2 (two) times daily.      . Cholecalciferol (VITAMIN D3) 5000  UNITS CAPS Take 5,000 mg by mouth daily.      Marland Kitchen CINNAMON PO Take 1 tablet by mouth daily.      . cyclobenzaprine (FLEXERIL) 10 MG tablet Take 10 mg by mouth daily as needed for muscle spasms.       Marland Kitchen DHEA 25 MG CAPS Take 25 mg by mouth daily.      Marland Kitchen estradiol (FEMTRACE) 0.45 MG tablet Take 0.45 mg by mouth 2 (two) times daily.       . flecainide (TAMBOCOR) 100 MG tablet Take 1 tablet (100 mg total) by mouth 2 (two) times daily.  60 tablet  11  . fluticasone (FLONASE) 50 MCG/ACT nasal spray Place 1 spray into both nostrils every 6 (six) hours as needed for allergies or rhinitis.      . furosemide (LASIX) 40 MG tablet Take 40 mg by mouth once a week. Pt takes with potassium      . HYDROmorphone HCl (EXALGO) 8 MG TB24 Take 16 mg by mouth 2 (two) times daily.       Marland Kitchen KRILL OIL PO Take 1 tablet by mouth daily.      . Melatonin 1 MG TABS Take 1 mg by mouth at bedtime.       . metoprolol tartrate (LOPRESSOR) 25 MG tablet Take 1 tablet (25 mg total) by mouth 2 (two) times daily.  60 tablet  11  . NATURE-THROID 97.5 MG TABS Take 97.5 mg by mouth daily.       Marland Kitchen OVER THE COUNTER MEDICATION Take 50 mg by mouth daily. Med name P-5-P      . OVER THE COUNTER MEDICATION Place 10 drops under the tongue 3 (three) times daily. [employer] brand HCG drops      . oxyCODONE (ROXICODONE) 15 MG immediate release tablet Take 15 mg by mouth every 4 (four) hours as needed for pain.       . potassium chloride SA (K-DUR,KLOR-CON) 20 MEQ tablet Take 20 mEq by mouth once a week. Pt takes with furosemide      . Pregnenolone Micronized POWD Take 100 mg by mouth daily.       . progesterone (PROMETRIUM) 100 MG capsule Take 100 mg by mouth Daily.       . promethazine (PHENERGAN) 25 MG tablet Take 25 mg by mouth every 6 (six) hours as needed for nausea.       Marland Kitchen  pseudoephedrine (SUDAFED) 120 MG 12 hr tablet Take 120 mg by mouth daily.       . Sodium Fluoride (PREVIDENT 5000 BOOSTER DT) Place onto teeth as directed.      . Testosterone  1.25 GM/ACT (1%) GEL Place 1.25 g onto the skin daily.        Assessment: 56 YOF admitted for progressive weakness likely due to CAP / ?UTI. Switching from IV cipro to IV levaquin for CAP coverage. WBC trending up 15.8>>16.6, Max temp 99 F, CrCl~ 80 mL/min.   Levaquin 2/15>> Cipro 2/13 >> 2/15   2/15 Blood Cx x2>> 2/13 Urine Cx>> 8000 colonies  2/13 Urine Cx >100K colonies but multiple bacteria   Goal of Therapy:  Eradication of infection  Plan:  1) Levaquin 750 mg IV Q 24 hours. Watch QTc with flecainide  2) Monitor CBC, cultures, renal fx and patient clinical progress 3) F/u LOT of abx therapy   Albertina Parr, PharmD.  Clinical Pharmacist Pager [phone]

## 2013-07-24 NOTE — Progress Notes (Signed)
[facility] PROGRESS NOTE  Taylor [name] [id number] DOB: [birth year] DOA: 07/22/2013 PCP: Raeanne Gathers, MD  Assessment/Plan:  Generalized Weakness -Presumed related to UTI and PNA.. -TSH/B12 ok. -Seen by PT and no further needs identified.  UTI -Cx with insignificant growth.  CAP/Acute Hypoxemic Respiratory Failure -CT Chest with extensive right-sided PNA and mediastinal/hilar adenopathy; but cannot rule out underlying mass. -Will change abx to levaquin. -Will also consult pulmonary for further recommendations (?FOB vs treating PNA and repeat CT chest in 4-6 weeks).  ARF -Resolved with IVF.  Leukocytosis -Improving. -Presumed related to PNA  Hyponatremia -Resolved.  Atrial Fibrillation -Rate controlled.  Code Status: Full Code Family Communication: Son and DIL at bedside updated on plan of care. Disposition Plan: Home when ready.   Consultants:  None   Antibiotics:  Levaquin  Subjective: C/o pain in her posterior ribs.  Objective: Filed Vitals:   07/24/13 0121 07/24/13 0213 07/24/13 0500 07/24/13 0613  BP:    118/58  Pulse: 75   77  Temp:    98.2 F (36.8 C)  TempSrc:    Oral  Resp: 18   18  Height:      Weight:   104.781 kg (231 lb)   SpO2: 95% 93%  95%    Intake/Output Summary (Last 24 hours) at 07/24/13 1227 Last data filed at 07/24/13 0740  Gross per 24 hour  Intake   2145 ml  Output      0 ml  Net   2145 ml   Filed Weights   07/22/13 1058 07/24/13 0500  Weight: 99.338 kg (219 lb) 104.781 kg (231 lb)    Exam:   General:  AA Ox3  Cardiovascular: RRR  Respiratory: Bilateral crackles.  Abdomen: S/NT/ND/+BS  Extremities: no C/C/E   Neurologic:  Intact, non-focal.  Data Reviewed: Basic Metabolic Panel:  Recent Labs Lab 07/22/13 1141 07/22/13 1659 07/23/13 0445 07/24/13 0545  NA 130* 126* 131* 138  K 4.8 4.6 3.9 4.0  CL 91* 87* 93* 100  CO2 25 25 22 26   GLUCOSE 100* 85 76 125*  BUN 21 19 12 8    CREATININE 1.30* 1.17* 1.06 0.91  CALCIUM 9.0 9.0 8.5 8.5  MG  --  2.0  --   --   PHOS  --  2.5  --   --    Liver Function Tests:  Recent Labs Lab 07/22/13 1659 07/23/13 0445  AST 27 21  ALT 29 23  ALKPHOS 70 89  BILITOT 0.6 0.5  PROT 6.9 6.2  ALBUMIN 3.2* 2.7*   No results found for this basename: LIPASE, AMYLASE,  in the last 168 hours No results found for this basename: AMMONIA,  in the last 168 hours CBC:  Recent Labs Lab 07/22/13 1141 07/22/13 1659 07/23/13 0445 07/24/13 0545  WBC 20.6* 17.3* 15.8* 16.6*  NEUTROABS 17.8* 15.2*  --   --   HGB 13.4 13.1 12.1 12.9  HCT 38.9 39.3 36.3 38.6  MCV 93.5 94.5 94.8 95.8  PLT 194 183 175 194   Cardiac Enzymes:  Recent Labs Lab 07/22/13 1141  TROPONINI <0.30   BNP (last 3 results) No results found for this basename: PROBNP,  in the last 8760 hours CBG:  Recent Labs Lab 07/23/13 0629  GLUCAP 77    Recent Results (from the past 240 hour(s))  URINE CULTURE     Status: None   Collection Time    07/22/13 12:27 PM      Result Value Ref Range Status  Specimen Description URINE, CLEAN CATCH   Final   Special Requests NONE   Final   Culture  Setup Time     Final   Value: 07/22/2013 17:12     Performed at [place]     Final   Value: >=100,000 COLONIES/ML     Performed at [facility]   Culture     Final   Value: Multiple bacterial morphotypes present, none predominant. Suggest appropriate recollection if clinically indicated.     Performed at [facility]   Report Status 07/23/2013 FINAL   Final  URINE CULTURE     Status: None   Collection Time    07/22/13  9:35 PM      Result Value Ref Range Status   Specimen Description URINE, CLEAN CATCH   Final   Special Requests NONE   Final   Culture  Setup Time     Final   Value: 07/22/2013 21:59     Performed at [facility]     Final   Value: 8,000 COLONIES/ML     Performed at [facility]   Culture     Final   Value: INSIGNIFICANT GROWTH     Performed at [facility]   Report Status 07/23/2013 FINAL   Final     Studies: Dg Chest 2 View  07/22/2013   CLINICAL DATA:  Weakness in the legs.  Chronic back pain.  EXAM: CHEST  2 VIEW  COMPARISON:  No priors.  FINDINGS: Lung volumes are normal. No consolidative airspace disease. Cephalization of the pulmonary vasculature, without frank pulmonary edema. Heart size is mildly enlarged. In addition, there is dilatation of the central pulmonary arteries. The patient is rotated to the right on today's exam, resulting in distortion of the mediastinal contours and reduced diagnostic sensitivity and specificity for mediastinal pathology. Atherosclerosis in the thoracic aorta.  IMPRESSION: 1. Cardiomegaly with pulmonary venous congestion, but no frank pulmonary edema at this time. 2. Atherosclerosis. 3. Dilatation of the central pulmonary arteries may suggest underlying pulmonary arterial hypertension.   Electronically Signed   By: [name] M.D.   On: 07/22/2013 12:33   Ct Head Wo Contrast  07/22/2013   CLINICAL DATA:  Lightheaded, weakness  EXAM: CT HEAD WITHOUT CONTRAST  CT CERVICAL SPINE WITHOUT CONTRAST  TECHNIQUE: Multidetector CT imaging of the head and cervical spine was performed following the standard protocol without intravenous contrast. Multiplanar CT image reconstructions of the cervical spine were also generated.  COMPARISON:  None.  FINDINGS: CT HEAD FINDINGS  There is no evidence of mass effect, midline shift or extra-axial fluid collections. There is no evidence of a space-occupying lesion or intracranial hemorrhage. There is no evidence of a cortical-based area of acute infarction.  The ventricles and sulci are appropriate for the patient's age. The basal cisterns are patent.  Visualized portions of the orbits are unremarkable. There is an air-fluid level in the right maxillary sinus.  The osseous structures are  unremarkable.  CT CERVICAL SPINE FINDINGS  The alignment is anatomic. The vertebral body heights are maintained. Incidental note is made of incomplete fusion of the posterior arch of C1 likely developmental. There is no acute fracture. 3 mm of anterolisthesis of C2 on C3 secondary to severe right facet arthropathy. The prevertebral soft tissues are normal. The intraspinal soft tissues are not fully imaged on this examination due to poor soft tissue contrast, but there is  no soft tissue gross abnormality.  There is anterior cervical fusion from C4 through C6 without hardware failure or complication with intervertebral spacer device is present. There is no significant osseous fusion across the C5-C6 disc space. There is osseous fusion across the C4-5 disc space.  There is severe right facet arthropathy at C2-3 and C3-4.  There is bilateral uncovertebral degenerative change at C4-5 resulting in foraminal stenosis. There is bilateral uncovertebral degenerative changes C5-6 resulting in bilateral foraminal stenosis.  There is a mild broad-based disc osteophyte complex at C6-7. There is bilateral foraminal stenosis at C6-7.  There is a mild broad-based disc osteophyte complex at C7-T1 and T1-T2. There is bilateral facet arthropathy at C7-T1 and T1-2.  The lung apices are clear.  IMPRESSION: 1. No acute intracranial pathology. 2. No acute osseous injury of the cervical spine. 3. Anterior cervical fusion from C4 through C6. 4. Cervical spine spondylosis as described above.   Electronically Signed   By: Kathreen Devoid   On: 07/22/2013 13:02   Ct Angio Chest Pe W/cm &/or Wo Cm  07/24/2013   CLINICAL DATA:  Chest pain, shortness of breath, hypoxia, history AFib and pneumonia, evaluate for pulmonary embolism  EXAM: CT ANGIOGRAPHY CHEST WITH CONTRAST  TECHNIQUE: Multidetector CT imaging of the chest was performed using the standard protocol during bolus administration of intravenous contrast. Multiplanar CT image  reconstructions and MIPs were obtained to evaluate the vascular anatomy.  CONTRAST:  10mL OMNIPAQUE IOHEXOL 350 MG/ML SOLN  COMPARISON:  DG CHEST 2 VIEW dated 07/22/2013; CT ABD W/CM dated 07/15/2008  FINDINGS: Vascular Findings:  There is suboptimal opacification of the pulmonary arterial system with the main pulmonary artery measuring only 211 Hounsfield units. Given this limitation, there are no discrete filling defects within the pulmonary arterial tree the suggest pulmonary embolism.  Cardiomegaly. Trace amount of pericardial fluid, presumably physiologic. There is scattered minimal amount of atherosclerotic plaque within a normal caliber thoracic aorta. Conventional configuration of the aortic arch. The branch vessels of the aortic arch are widely patent throughout their imaged course. No definite thoracic aortic dissection or peri aortic stranding.  Review of the MIP images confirms the above findings.   ----------------------------------------------------------------------------------  Nonvascular Findings:  There is are consolidative airspace opacities involving the majority of the right lower lobe with an additional smaller areas of consolidation within the right upper (image 28, series 6) and middle lobe (image 47, series 4). Air bronchograms are noted within many of these consolidative airspace opacities. There are ill-defined areas of ground-glass an apparent slightly nodular interlobular septal thickening within the aerated portions of the right lung. These findings are associated with a small amount of loculated pleural fluid within the right lung apex about the right lateral aspect of the pleural space as well as adjacent to the dependent portion of the right lower lobe. No discrete focal airspace opacities identified within the contralateral left lung.  Mediastinal and right hilar lymphadenopathy with index right-sided paratracheal lymph node measuring approximately 1.2 cm in greatest short axis  diameter (image 15, series 4), index right suprahilar nodal conglomeration measuring approximately 1.8 cm (image 28) and right infrahilar nodal conglomeration measuring 1.6 cm (image 40). No definite hilar or axillary lymphadenopathy.  Scattered right-sided granulomas including a punctate (approximately 4 mm) granuloma within the right lung apex (image 13, series 6) with associated right hilar partially calcified lymph nodes, the sequela of prior granulomatous infection.  Limited early arterial phase evaluation of the upper abdomen demonstrates the cranial aspect of  the previously characterized exophytic cyst arising from the superior pole of the right kidney. Splenic granulomas.  No acute or aggressive osseous abnormalities. Old/healed fracture involving the anterior aspect of the left third rib.  Note is made of an asymmetric approximately 2.3 x 1.7 cm opacity within in the upper outer quadrant of left breast (image 28, series 4).  IMPRESSION: 1. Extensive multi lobar airspace opacities within the right lung, worse within the right lower lobe. While possibly infectious in etiology, given associated mediastinal and right hilar lymphadenopathy an underlying mass is not excluded. As such, a follow-up chest radiograph in 4-6 weeks after treatment is recommended to ensure resolution. Alternatively, further evaluation could be performed with bronchoscopy as clinically indicated. 2. Sequela of prior granulomatous infection as above. 3. Indeterminate approximately 2.3 cm asymmetric nodule within the upper-outer quadrant of the left breast. Further evaluation with physical examination and diagnostic mammogram (if not recently performed) is recommended. Critical Value/emergent results were called by telephone at the time of interpretation on 07/24/2013 at 9:23 AM to Dr. Lelon Frohlich , who verbally acknowledged these results.   Electronically Signed   By: Sandi Mariscal M.D.   On: 07/24/2013 09:43   Ct Cervical  Spine Wo Contrast  07/22/2013   CLINICAL DATA:  Lightheaded, weakness  EXAM: CT HEAD WITHOUT CONTRAST  CT CERVICAL SPINE WITHOUT CONTRAST  TECHNIQUE: Multidetector CT imaging of the head and cervical spine was performed following the standard protocol without intravenous contrast. Multiplanar CT image reconstructions of the cervical spine were also generated.  COMPARISON:  None.  FINDINGS: CT HEAD FINDINGS  There is no evidence of mass effect, midline shift or extra-axial fluid collections. There is no evidence of a space-occupying lesion or intracranial hemorrhage. There is no evidence of a cortical-based area of acute infarction.  The ventricles and sulci are appropriate for the patient's age. The basal cisterns are patent.  Visualized portions of the orbits are unremarkable. There is an air-fluid level in the right maxillary sinus.  The osseous structures are unremarkable.  CT CERVICAL SPINE FINDINGS  The alignment is anatomic. The vertebral body heights are maintained. Incidental note is made of incomplete fusion of the posterior arch of C1 likely developmental. There is no acute fracture. 3 mm of anterolisthesis of C2 on C3 secondary to severe right facet arthropathy. The prevertebral soft tissues are normal. The intraspinal soft tissues are not fully imaged on this examination due to poor soft tissue contrast, but there is no soft tissue gross abnormality.  There is anterior cervical fusion from C4 through C6 without hardware failure or complication with intervertebral spacer device is present. There is no significant osseous fusion across the C5-C6 disc space. There is osseous fusion across the C4-5 disc space.  There is severe right facet arthropathy at C2-3 and C3-4.  There is bilateral uncovertebral degenerative change at C4-5 resulting in foraminal stenosis. There is bilateral uncovertebral degenerative changes C5-6 resulting in bilateral foraminal stenosis.  There is a mild broad-based disc osteophyte  complex at C6-7. There is bilateral foraminal stenosis at C6-7.  There is a mild broad-based disc osteophyte complex at C7-T1 and T1-T2. There is bilateral facet arthropathy at C7-T1 and T1-2.  The lung apices are clear.  IMPRESSION: 1. No acute intracranial pathology. 2. No acute osseous injury of the cervical spine. 3. Anterior cervical fusion from C4 through C6. 4. Cervical spine spondylosis as described above.   Electronically Signed   By: Kathreen Devoid   On: 07/22/2013 13:02  Mr Lumbar Spine Wo Contrast  07/22/2013   CLINICAL DATA:  Low back pain.  Lower extremity weakness.  EXAM: MRI LUMBAR SPINE WITHOUT CONTRAST  TECHNIQUE: Multiplanar, multisequence MR imaging was performed. No intravenous contrast was administered.  COMPARISON:  CT ABD W/CM dated 07/15/2008  FINDINGS: The lowest lumbar type non-rib-bearing vertebra is labeled as L5. The conus medullaris appears normal. Conus level: L1-2.  Despite efforts by the technologist and patient, motion artifact is present on today's exam and could not be eliminated. This reduces exam sensitivity and specificity. The patient terminated the exam prior to the T1 axial series acquisition.  Multiple left renal cysts are partially visualized. Lower paraspinal muscular atrophy. 2 cm vertebral hemangioma eccentric to the left in the T11 vertebral body. There is 5 mm of anterolisthesis at L4-5 and 3 mm anterolisthesis at L3-4. Loss of disc height noted especially at L5-S1 and to a lesser extent at L4-5.  Additional findings at individual levels are as follows:  T11-12: No impingement. Bilateral facet arthropathy and diffuse disc bulge.  T12-L1: Mild left foraminal stenosis due to facet arthropathy. Diffuse disc bulge.  L1-2: No impingement. Bilateral facet arthropathy, disc bulge, and left paracentral disc protrusion.  L2-3: Borderline left foraminal stenosis due to disc bulge and facet arthropathy.  L3-4: Moderate central stenosis, mild right foraminal stenosis, and mild  right subarticular lateral recess stenosis due to facet arthropathy, disc uncovering, and diffuse disc bulge.  L4-5: Borderline central stenosis and borderline right subarticular lateral recess stenosis due to disc uncovering and facet arthropathy.  L5-S1: Mild right foraminal stenosis due to facet and intervertebral spurring along with disc bulge.  IMPRESSION: 1. Lumbar spondylosis and degenerative disc disease causing moderate impingement at L3-4 and mild impingement at T12-L1 and L5-S1 as noted above. 2. The patient terminated the exam prior to the T1 axial series acquisition. Despite efforts by the technologist and patient, motion artifact is present on today's exam and could not be eliminated. This reduces exam sensitivity and specificity.   Electronically Signed   By: Sherryl Barters M.D.   On: 07/22/2013 14:25    Scheduled Meds: . aspirin EC  81 mg Oral Daily  . buPROPion  150 mg Oral Daily  . celecoxib  200 mg Oral BID  . cholecalciferol  5,000 Units Oral Daily  . estradiol  0.5 mg Oral BID  . flecainide  100 mg Oral Q12H  . HYDROmorphone HCl  16 mg Oral BID  . levofloxacin  750 mg Oral Daily  . progesterone  100 mg Oral Daily  . pseudoephedrine  120 mg Oral Daily  . testosterone  1.25 g Transdermal Daily  . thyroid  90 mg Oral QAC breakfast   Continuous Infusions: . sodium chloride 100 mL/hr at 07/24/13 0600    Principal Problem:   Weakness Active Problems:   Atrial fibrillation   UTI (urinary tract infection)   HTN (hypertension)   Leukocytosis   Hyponatremia   Acute renal failure   CAP (community acquired pneumonia)    Time spent: 45 minutes. Greater than 50% of this time was spent in direct contact with the patient coordinating care.    Lelon Frohlich  [facility] Pager [phone]  If 7PM-7AM, please contact night-coverage at [email or web address], password [facility] 07/24/2013, 12:27 PM  LOS: 2 days

## 2013-07-24 NOTE — Progress Notes (Signed)
Paged Dr. Joesph Fillers patient would like "procedure" tomorrow.  No response from MD office, will report to second shift RN.  Nsg to continue to monitor for status changes.

## 2013-07-25 ENCOUNTER — Inpatient Hospital Stay (HOSPITAL_COMMUNITY): Payer: BC Managed Care – PPO

## 2013-07-25 DIAGNOSIS — R091 Pleurisy: Secondary | ICD-10-CM

## 2013-07-25 LAB — CBC
HCT: 37.9 % (ref 36.0–46.0)
Hemoglobin: 12.5 g/dL (ref 12.0–15.0)
MCH: 31.6 pg (ref 26.0–34.0)
MCHC: 33 g/dL (ref 30.0–36.0)
MCV: 95.7 fL (ref 78.0–100.0)
Platelets: 239 10*3/uL (ref 150–400)
RBC: 3.96 MIL/uL (ref 3.87–5.11)
RDW: 13.8 % (ref 11.5–15.5)
WBC: 14.9 10*3/uL — ABNORMAL HIGH (ref 4.0–10.5)

## 2013-07-25 LAB — LEGIONELLA ANTIGEN, URINE: LEGIONELLA ANTIGEN, URINE: NEGATIVE

## 2013-07-25 LAB — GLUCOSE, CAPILLARY: Glucose-Capillary: 101 mg/dL — ABNORMAL HIGH (ref 70–99)

## 2013-07-25 LAB — BASIC METABOLIC PANEL
BUN: 5 mg/dL — AB (ref 6–23)
CALCIUM: 8.8 mg/dL (ref 8.4–10.5)
CO2: 25 meq/L (ref 19–32)
Chloride: 101 mEq/L (ref 96–112)
Creatinine, Ser: 0.87 mg/dL (ref 0.50–1.10)
GFR calc Af Amer: 82 mL/min — ABNORMAL LOW (ref >90)
GFR calc non Af Amer: 71 mL/min — ABNORMAL LOW (ref >90)
GLUCOSE: 107 mg/dL — AB (ref 70–99)
Potassium: 3.7 mEq/L (ref 3.7–5.3)
SODIUM: 138 meq/L (ref 137–147)

## 2013-07-25 LAB — RESPIRATORY VIRUS PANEL
ADENOVIRUS: NOT DETECTED
Influenza A H1: NOT DETECTED
Influenza A H3: NOT DETECTED
Influenza A: NOT DETECTED
Influenza B: NOT DETECTED
METAPNEUMOVIRUS: NOT DETECTED
PARAINFLUENZA 2 A: NOT DETECTED
PARAINFLUENZA 3 A: NOT DETECTED
Parainfluenza 1: NOT DETECTED
Respiratory Syncytial Virus A: NOT DETECTED
Respiratory Syncytial Virus B: NOT DETECTED
Rhinovirus: NOT DETECTED

## 2013-07-25 MED ORDER — KETOROLAC TROMETHAMINE 15 MG/ML IJ SOLN
15.0000 mg | Freq: Four times a day (QID) | INTRAMUSCULAR | Status: AC
  Administered 2013-07-25 – 2013-07-26 (×4): 15 mg via INTRAVENOUS
  Filled 2013-07-25 (×4): qty 1

## 2013-07-25 MED ORDER — TESTOSTERONE 12.5 MG/ACT (1%) TD GEL: 1.2500 g | TRANSDERMAL | Status: DC

## 2013-07-25 NOTE — Progress Notes (Signed)
PULMONARY / CRITICAL CARE MEDICINE  Name: ALEKSANDRA RABEN MRN: [id number] DOB: Aug 11, 1950    ADMISSION DATE:  07/22/2013 CONSULTATION DATE:  07/24/12   REFERRING MD :  [facility]  PRIMARY SERVICE:  [facility]   CHIEF COMPLAINT:  Abnormal CT chest   BRIEF PATIENT DESCRIPTION:  63 yo female with known hx of A Fib and HTN admitted 2/11 for progressive weakness , hypotension thought to be secondary to UTI started on IV abx. BP improved with fluids . Developed hypoxia  W/ subsequent CT chest showed multifocal  aspdz on right . Pulmonary consult on 2/15 .   SIGNIFICANT EVENTS / STUDIES:  2/15 CT chest angio >Extensive multi lobar airspace opacities within the right lung and R hilar LAN 2/13 MRI LS >Lumbar spondylosis and degenerative disc disease causing moderate  impingement at L3-4 and mild impingement at T12-L1 and L5-S1  2/13 CT head >no acute process  2/13 CT  C Spine >no acute process    CULTURES: Urine 2/13 >> multiple organisms Resp 2/15 >>   ANTIBIOTICS: Cipro 2/13 >>> 2/14  Levaquin 2/15 >>>   SUBJECTIVE:  NAD  VITAL SIGNS: Temp:  [97.9 F (36.6 C)-98.5 F (36.9 C)] 98.3 F (36.8 C) (02/16 0500) Pulse Rate:  [76-83] 76 (02/16 0500) Resp:  [16-18] 16 (02/16 0500) BP: (102-123)/(54-86) 104/67 mmHg (02/16 0500) SpO2:  [92 %-96 %] 92 % (02/16 0500) Weight:  [104.781 kg (231 lb)] 104.781 kg (231 lb) (02/16 0500)  PHYSICAL EXAMINATION: General: No overt distress Cardiovascular: RRR , no m/r/g  Lungs:  Markedly diminished in R base, R pleural friction rub Abdomen: obese , distended , BS +  Ext: no edema  I have reviewed all of today's lab results. Relevant abnormalities are discussed in the A/P section  No new CXR  ASSESSMENT / PLAN: History of tobacco use Likely CAP Doubt lung mass Mediastinal / hilar lymphadenopathy, likely reactive Small loculated pleural effusion with prominent pleural friction rub  Cont levofloxacin PA/lat CXR today Scheduled ketorolac X 24 hrs for  pleuritis Check PCT - if elevated, would be supportive evidence that this is PNA  Merton Border, MD ; [facility] service Mobile [phone].  After 5:30 PM or weekends, call [phone]  Seen with: Richardson Landry Minor ACNP Maryanna Shape PCCM Pager [phone] till 3 pm If no answer page [phone] 07/25/2013, 10:36 AM

## 2013-07-25 NOTE — Progress Notes (Signed)
Pt having right sided chest pain on deep inspiration. Sats 88 on Rm air. 92% on 2 liters oxygen.

## 2013-07-25 NOTE — Progress Notes (Signed)
[facility] PROGRESS NOTE  Taylor [name] [id number] DOB: 1950/09/15 DOA: 07/22/2013 PCP: Raeanne Gathers, MD  Assessment/Plan:  Generalized Weakness -Presumed related to UTI and PNA.. -TSH/B12 ok. -Seen by PT and no further needs identified.  UTI -Cx with insignificant growth.  CAP/Acute Hypoxemic Respiratory Failure -CT Chest with extensive right-sided PNA and mediastinal/hilar adenopathy; but cannot rule out underlying mass. -Will change abx to levaquin. -Will also consult pulmonary for further recommendations (?FOB vs treating PNA and repeat CT chest in 4-6 weeks). Pt prefers to be more aggressive.   ARF -Resolved with IVF.  Leukocytosis -Improving. -Presumed related to PNA  Hyponatremia -Resolved.  Atrial Fibrillation -Rate controlled.  Code Status: Full Code Family Communication: Husband at bedside updated on plan of care. Disposition Plan: Home when ready.   Consultants:  Pulmonary   Antibiotics:  Levaquin  Subjective: C/o pain in her posterior ribs. Productive cough.  Objective: Filed Vitals:   07/25/13 0145 07/25/13 0209 07/25/13 0451 07/25/13 0500  BP:    104/67  Pulse:    76  Temp:    98.3 F (36.8 C)  TempSrc:    Oral  Resp:    16  Height:      Weight:    104.781 kg (231 lb)  SpO2: 93% 92% 92% 92%    Intake/Output Summary (Last 24 hours) at 07/25/13 1330 Last data filed at 07/25/13 0600  Gross per 24 hour  Intake   3100 ml  Output      0 ml  Net   3100 ml   Filed Weights   07/22/13 1058 07/24/13 0500 07/25/13 0500  Weight: 99.338 kg (219 lb) 104.781 kg (231 lb) 104.781 kg (231 lb)    Exam:   General:  AA Ox3  Cardiovascular: RRR  Respiratory: Bilateral crackles R>L  Abdomen: S/NT/ND/+BS  Extremities: no C/C/E   Neurologic:  Intact, non-focal.  Data Reviewed: Basic Metabolic Panel:  Recent Labs Lab 07/22/13 1141 07/22/13 1659 07/23/13 0445 07/24/13 0545 07/25/13 1034  NA 130* 126* 131* 138 138  K  4.8 4.6 3.9 4.0 3.7  CL 91* 87* 93* 100 101  CO2 25 25 22 26 25   GLUCOSE 100* 85 76 125* 107*  BUN 21 19 12 8  5*  CREATININE 1.30* 1.17* 1.06 0.91 0.87  CALCIUM 9.0 9.0 8.5 8.5 8.8  MG  --  2.0  --   --   --   PHOS  --  2.5  --   --   --    Liver Function Tests:  Recent Labs Lab 07/22/13 1659 07/23/13 0445  AST 27 21  ALT 29 23  ALKPHOS 70 89  BILITOT 0.6 0.5  PROT 6.9 6.2  ALBUMIN 3.2* 2.7*   No results found for this basename: LIPASE, AMYLASE,  in the last 168 hours No results found for this basename: AMMONIA,  in the last 168 hours CBC:  Recent Labs Lab 07/22/13 1141 07/22/13 1659 07/23/13 0445 07/24/13 0545 07/25/13 1034  WBC 20.6* 17.3* 15.8* 16.6* 14.9*  NEUTROABS 17.8* 15.2*  --   --   --   HGB 13.4 13.1 12.1 12.9 12.5  HCT 38.9 39.3 36.3 38.6 37.9  MCV 93.5 94.5 94.8 95.8 95.7  PLT 194 183 175 194 239   Cardiac Enzymes:  Recent Labs Lab 07/22/13 1141  TROPONINI <0.30   BNP (last 3 results)  Recent Labs  07/24/13 1440  PROBNP 1142.0*   CBG:  Recent Labs Lab 07/23/13 0629 07/25/13 0600  GLUCAP 77  101*    Recent Results (from the past 240 hour(s))  URINE CULTURE     Status: None   Collection Time    07/22/13 12:27 PM      Result Value Ref Range Status   Specimen Description URINE, CLEAN CATCH   Final   Special Requests NONE   Final   Culture  Setup Time     Final   Value: 07/22/2013 17:12     Performed at [place]     Final   Value: >=100,000 COLONIES/ML     Performed at [facility]   Culture     Final   Value: Multiple bacterial morphotypes present, none predominant. Suggest appropriate recollection if clinically indicated.     Performed at [facility]   Report Status 07/23/2013 FINAL   Final  URINE CULTURE     Status: None   Collection Time    07/22/13  9:35 PM      Result Value Ref Range Status   Specimen Description URINE, CLEAN CATCH   Final   Special Requests NONE   Final    Culture  Setup Time     Final   Value: 07/22/2013 21:59     Performed at [facility]     Final   Value: 8,000 COLONIES/ML     Performed at [facility]   Culture     Final   Value: INSIGNIFICANT GROWTH     Performed at [facility]   Report Status 07/23/2013 FINAL   Final  CULTURE, BLOOD (ROUTINE X 2)     Status: None   Collection Time    07/24/13 11:45 AM      Result Value Ref Range Status   Specimen Description BLOOD RIGHT ANTECUBITAL   Final   Special Requests BOTTLES DRAWN AEROBIC ONLY 10CC   Final   Culture  Setup Time     Final   Value: 07/24/2013 18:30     Performed at [facility]   Culture     Final   Value:        BLOOD CULTURE RECEIVED NO GROWTH TO DATE CULTURE WILL BE HELD FOR 5 DAYS BEFORE ISSUING A FINAL NEGATIVE REPORT     Performed at [facility]   Report Status PENDING   Incomplete  CULTURE, BLOOD (ROUTINE X 2)     Status: None   Collection Time    07/24/13 11:50 AM      Result Value Ref Range Status   Specimen Description BLOOD RIGHT HAND   Final   Special Requests BOTTLES DRAWN AEROBIC ONLY 5CC   Final   Culture  Setup Time     Final   Value: 07/24/2013 18:30     Performed at [facility]   Culture     Final   Value:        BLOOD CULTURE RECEIVED NO GROWTH TO DATE CULTURE WILL BE HELD FOR 5 DAYS BEFORE ISSUING A FINAL NEGATIVE REPORT     Performed at [facility]   Report Status PENDING   Incomplete  CULTURE, EXPECTORATED SPUTUM-ASSESSMENT     Status: None   Collection Time    07/24/13  4:13 PM      Result Value Ref Range Status   Specimen Description SPUTUM   Final   Special Requests NONE   Final   Sputum evaluation     Final  Value: THIS SPECIMEN IS ACCEPTABLE. RESPIRATORY CULTURE REPORT TO FOLLOW.   Report Status 07/24/2013 FINAL   Final  CULTURE, RESPIRATORY (NON-EXPECTORATED)     Status: None   Collection Time    07/24/13  4:13 PM      Result Value Ref Range Status    Specimen Description SPUTUM   Final   Special Requests NONE   Final   Gram Stain     Final   Value: FEW WBC PRESENT, PREDOMINANTLY PMN     RARE SQUAMOUS EPITHELIAL CELLS PRESENT     FEW GRAM POSITIVE RODS     FEW GRAM POSITIVE COCCI IN PAIRS     Performed at [facility]   Culture     Final   Value: Culture reincubated for better growth     Performed at [facility]   Report Status PENDING   Incomplete     Studies: Ct Angio Chest Pe W/cm &/or Wo Cm  07/24/2013   CLINICAL DATA:  Chest pain, shortness of breath, hypoxia, history AFib and pneumonia, evaluate for pulmonary embolism  EXAM: CT ANGIOGRAPHY CHEST WITH CONTRAST  TECHNIQUE: Multidetector CT imaging of the chest was performed using the standard protocol during bolus administration of intravenous contrast. Multiplanar CT image reconstructions and MIPs were obtained to evaluate the vascular anatomy.  CONTRAST:  12mL OMNIPAQUE IOHEXOL 350 MG/ML SOLN  COMPARISON:  DG CHEST 2 VIEW dated 07/22/2013; CT ABD W/CM dated 07/15/2008  FINDINGS: Vascular Findings:  There is suboptimal opacification of the pulmonary arterial system with the main pulmonary artery measuring only 211 Hounsfield units. Given this limitation, there are no discrete filling defects within the pulmonary arterial tree the suggest pulmonary embolism.  Cardiomegaly. Trace amount of pericardial fluid, presumably physiologic. There is scattered minimal amount of atherosclerotic plaque within a normal caliber thoracic aorta. Conventional configuration of the aortic arch. The branch vessels of the aortic arch are widely patent throughout their imaged course. No definite thoracic aortic dissection or peri aortic stranding.  Review of the MIP images confirms the above findings.   ----------------------------------------------------------------------------------  Nonvascular Findings:  There is are consolidative airspace opacities involving the majority of the right lower lobe  with an additional smaller areas of consolidation within the right upper (image 28, series 6) and middle lobe (image 47, series 4). Air bronchograms are noted within many of these consolidative airspace opacities. There are ill-defined areas of ground-glass an apparent slightly nodular interlobular septal thickening within the aerated portions of the right lung. These findings are associated with a small amount of loculated pleural fluid within the right lung apex about the right lateral aspect of the pleural space as well as adjacent to the dependent portion of the right lower lobe. No discrete focal airspace opacities identified within the contralateral left lung.  Mediastinal and right hilar lymphadenopathy with index right-sided paratracheal lymph node measuring approximately 1.2 cm in greatest short axis diameter (image 15, series 4), index right suprahilar nodal conglomeration measuring approximately 1.8 cm (image 28) and right infrahilar nodal conglomeration measuring 1.6 cm (image 40). No definite hilar or axillary lymphadenopathy.  Scattered right-sided granulomas including a punctate (approximately 4 mm) granuloma within the right lung apex (image 13, series 6) with associated right hilar partially calcified lymph nodes, the sequela of prior granulomatous infection.  Limited early arterial phase evaluation of the upper abdomen demonstrates the cranial aspect of the previously characterized exophytic cyst arising from the superior pole of the right kidney. Splenic granulomas.  No acute  or aggressive osseous abnormalities. Old/healed fracture involving the anterior aspect of the left third rib.  Note is made of an asymmetric approximately 2.3 x 1.7 cm opacity within in the upper outer quadrant of left breast (image 28, series 4).  IMPRESSION: 1. Extensive multi lobar airspace opacities within the right lung, worse within the right lower lobe. While possibly infectious in etiology, given associated  mediastinal and right hilar lymphadenopathy an underlying mass is not excluded. As such, a follow-up chest radiograph in 4-6 weeks after treatment is recommended to ensure resolution. Alternatively, further evaluation could be performed with bronchoscopy as clinically indicated. 2. Sequela of prior granulomatous infection as above. 3. Indeterminate approximately 2.3 cm asymmetric nodule within the upper-outer quadrant of the left breast. Further evaluation with physical examination and diagnostic mammogram (if not recently performed) is recommended. Critical Value/emergent results were called by telephone at the time of interpretation on 07/24/2013 at 9:23 AM to Dr. Lelon Frohlich , who verbally acknowledged these results.   Electronically Signed   By: Sandi Mariscal M.D.   On: 07/24/2013 09:43    Scheduled Meds: . aspirin EC  81 mg Oral Daily  . buPROPion  150 mg Oral Daily  . celecoxib  200 mg Oral BID  . cholecalciferol  5,000 Units Oral Daily  . estradiol  0.5 mg Oral BID  . flecainide  100 mg Oral Q12H  . HYDROmorphone HCl  16 mg Oral BID  . ketorolac  15 mg Intravenous 4 times per day  . levalbuterol  0.63 mg Nebulization Q6H  . levofloxacin (LEVAQUIN) IV  750 mg Intravenous Q24H  . progesterone  100 mg Oral Daily  . pseudoephedrine  120 mg Oral Daily  . [START ON 07/26/2013] Testosterone  1.25 g Transdermal QODAY  . thyroid  90 mg Oral QAC breakfast   Continuous Infusions: . sodium chloride 100 mL/hr at 07/25/13 0600    Principal Problem:   Weakness Active Problems:   Atrial fibrillation   UTI (urinary tract infection)   HTN (hypertension)   Leukocytosis   Hyponatremia   Acute renal failure   CAP (community acquired pneumonia)   COPD (chronic obstructive pulmonary disease)   Tobacco use   Pneumonia   Lung mass   Pleural effusion    Time spent: 35 minutes. Greater than 50% of this time was spent in direct contact with the patient coordinating care.    Lelon Frohlich  [facility] Pager [phone]  If 7PM-7AM, please contact night-coverage at [email or web address], password [facility] 07/25/2013, 1:30 PM  LOS: 3 days

## 2013-07-25 NOTE — Consult Note (Deleted)
No note

## 2013-07-26 DIAGNOSIS — R091 Pleurisy: Secondary | ICD-10-CM

## 2013-07-26 LAB — CULTURE, RESPIRATORY W GRAM STAIN: Culture: NORMAL

## 2013-07-26 LAB — CULTURE, RESPIRATORY

## 2013-07-26 LAB — CBC
HCT: 35.4 % — ABNORMAL LOW (ref 36.0–46.0)
Hemoglobin: 11.6 g/dL — ABNORMAL LOW (ref 12.0–15.0)
MCH: 31.5 pg (ref 26.0–34.0)
MCHC: 32.8 g/dL (ref 30.0–36.0)
MCV: 96.2 fL (ref 78.0–100.0)
PLATELETS: 228 10*3/uL (ref 150–400)
RBC: 3.68 MIL/uL — ABNORMAL LOW (ref 3.87–5.11)
RDW: 14.2 % (ref 11.5–15.5)
WBC: 15.1 10*3/uL — ABNORMAL HIGH (ref 4.0–10.5)

## 2013-07-26 LAB — GLUCOSE, CAPILLARY: Glucose-Capillary: 93 mg/dL (ref 70–99)

## 2013-07-26 LAB — PROCALCITONIN: PROCALCITONIN: 0.8 ng/mL

## 2013-07-26 MED ORDER — DOCUSATE SODIUM 100 MG PO CAPS
100.0000 mg | ORAL_CAPSULE | Freq: Two times a day (BID) | ORAL | Status: DC
  Administered 2013-07-26 – 2013-08-01 (×13): 100 mg via ORAL
  Filled 2013-07-26 (×18): qty 1

## 2013-07-26 MED ORDER — IBUPROFEN 400 MG PO TABS
400.0000 mg | ORAL_TABLET | ORAL | Status: DC | PRN
  Administered 2013-07-26 – 2013-07-27 (×3): 400 mg via ORAL
  Filled 2013-07-26 (×5): qty 1

## 2013-07-26 MED ORDER — TESTOSTERONE PROPIONATE 2 % TD CREA: 0.2500 g | TOPICAL_CREAM | TRANSDERMAL | Status: DC

## 2013-07-26 MED ORDER — TESTOSTERONE PROPIONATE 2 % TD CREA
0.2500 g | TOPICAL_CREAM | Freq: Every day | TRANSDERMAL | Status: DC
  Administered 2013-07-26: 0.5 g via TRANSDERMAL

## 2013-07-26 MED ORDER — POLYETHYLENE GLYCOL 3350 17 G PO PACK
17.0000 g | PACK | Freq: Every day | ORAL | Status: DC
  Administered 2013-07-26 – 2013-08-01 (×7): 17 g via ORAL
  Filled 2013-07-26 (×8): qty 1

## 2013-07-26 MED ORDER — LEVOFLOXACIN 750 MG PO TABS
750.0000 mg | ORAL_TABLET | Freq: Every day | ORAL | Status: DC
  Administered 2013-07-26 – 2013-07-27 (×2): 750 mg via ORAL
  Filled 2013-07-26 (×3): qty 1

## 2013-07-26 MED ORDER — NONFORMULARY OR COMPOUNDED ITEM
1.0000 mL | Freq: Two times a day (BID) | Status: DC
  Administered 2013-07-26 – 2013-08-01 (×8): 1 mL via TOPICAL
  Filled 2013-07-26: qty 1

## 2013-07-26 MED ORDER — TESTOSTERONE PROPIONATE 2 % TD CREA
0.5000 g | TOPICAL_CREAM | TRANSDERMAL | Status: DC
  Filled 2013-07-26: qty 1

## 2013-07-26 NOTE — Progress Notes (Signed)
PULMONARY / CRITICAL CARE MEDICINE  Name: ANESIA BLACKWELL MRN: [id number] DOB: 1950/06/15    ADMISSION DATE:  07/22/2013 CONSULTATION DATE:  07/24/12   REFERRING MD :  [facility]  PRIMARY SERVICE:  [facility]   CHIEF COMPLAINT:  Abnormal CT chest   BRIEF PATIENT DESCRIPTION:  63 yo female with known hx of A Fib and HTN admitted 2/11 for progressive weakness , hypotension thought to be secondary to UTI started on IV abx. BP improved with fluids . Developed hypoxia  W/ subsequent CT chest showed multifocal  aspdz on right . Pulmonary consult on 2/15 .   SIGNIFICANT EVENTS / STUDIES:  2/15 CT chest angio >Extensive multi lobar airspace opacities within the right lung and R hilar LAN 2/13 MRI LS >Lumbar spondylosis and degenerative disc disease causing moderate  impingement at L3-4 and mild impingement at T12-L1 and L5-S1  2/13 CT head >no acute process  2/13 CT  C Spine >no acute process   CULTURES: Urine 2/13 > multiple organisms Strep Ag 2/15 >> NEG Legionella Ag 2/15 >> NEG Respiratory Viral 2/15 > NEG Sputum 2/15 >> NOF Blood 2/15 >>>  ANTIBIOTICS: Cipro 2/13 >>> 2/14  Levaquin 2/15 >>>   SUBJECTIVE:  NAD  VITAL SIGNS: Temp:  [97.3 F (36.3 C)-98.9 F (37.2 C)] 97.3 F (36.3 C) (02/17 0536) Pulse Rate:  [74-92] 84 (02/17 0536) Resp:  [18] 18 (02/17 0536) BP: (112-128)/(42-71) 122/42 mmHg (02/17 0536) SpO2:  [93 %-98 %] 93 % (02/17 0536) Weight:  [108.183 kg (238 lb 8 oz)] 108.183 kg (238 lb 8 oz) (02/17 0600)  PHYSICAL EXAMINATION: General: No overt distress Cardiovascular: RRR , no m/r/g  Lungs:  Markedly diminished in R base, R pleural friction rub - less prominent 2/17 Abdomen: obese , distended , BS +  Ext: no edema  I have reviewed all of today's lab results. Relevant abnormalities are discussed in the A/P section   CXR - extensive AS dz on R c/w PNA  ASSESSMENT / PLAN: History of tobacco use CAP, NOS  Doubt lung mass Mediastinal / hilar lymphadenopathy, likely  reactive Small loculated pleural effusion with prominent pleural friction rub  -Supplemental O2 as needed to keep SpO2 >92%.  -Complete 10 days levofloxacin -She may be discharge safely to home when pain is adequately controlled with oral meds, SpO2 > 88% and she is able to perform necessary ADLs -She should undergo repeat PA/lat CXR in 2 wks to ensure that infiltrate is improving -She should undergo repeat CT chest in 6 wks to ensure no mass and that LAN is stable to improving  PCCM will sign off. Please call if we can be of further assistance  Merton Border, MD ; [facility] [phone].  After 5:30 PM or weekends, call [phone]  Seen with: Georgann Housekeeper, [place] [facility] Pager [phone] or [phone]

## 2013-07-26 NOTE — Progress Notes (Signed)
[facility] PROGRESS NOTE  Taylor [name] EHM:[id number] DOB: 07/09/1950 DOA: 07/22/2013 PCP: Raeanne Gathers, MD  Assessment/Plan:  Generalized Weakness -Presumed related to UTI and PNA.. -TSH/B12 ok. -Seen by PT and no further needs identified.  UTI -Cx with insignificant growth.  CAP/Acute Hypoxemic Respiratory Failure -CT Chest with extensive right-sided PNA and mediastinal/hilar adenopathy; but cannot rule out underlying mass. -Continue levaquin for 10 days total. -Appreciate pulmonary input. -Plan to treat for 10 days and repeat CT Chest in 4-6 weeks.  ARF -Resolved with IVF.  Leukocytosis -Improving. -Presumed related to PNA  Hyponatremia -Resolved.  Atrial Fibrillation -Rate controlled.  Code Status: Full Code Family Communication: Patient only. Disposition Plan: Home when ready; likely 24 hours. Work on titrating oxygen today and on pain control.   Consultants:  Pulmonary   Antibiotics:  Levaquin  Subjective: C/o pain in her posterior ribs. Productive cough.  Objective: Filed Vitals:   07/26/13 0600 07/26/13 1002 07/26/13 1043 07/26/13 1300  BP:    73/40  Pulse:    79  Temp:    98.8 F (37.1 C)  TempSrc:      Resp:    18  Height:      Weight: 108.183 kg (238 lb 8 oz)     SpO2:  98% 90% 95%    Intake/Output Summary (Last 24 hours) at 07/26/13 1356 Last data filed at 07/26/13 1300  Gross per 24 hour  Intake   2640 ml  Output      0 ml  Net   2640 ml   Filed Weights   07/24/13 0500 07/25/13 0500 07/26/13 0600  Weight: 104.781 kg (231 lb) 104.781 kg (231 lb) 108.183 kg (238 lb 8 oz)    Exam:   General:  AA Ox3  Cardiovascular: RRR  Respiratory: Bilateral crackles R>L  Abdomen: S/NT/ND/+BS  Extremities: no C/C/E   Neurologic:  Intact, non-focal.  Data Reviewed: Basic Metabolic Panel:  Recent Labs Lab 07/22/13 1141 07/22/13 1659 07/23/13 0445 07/24/13 0545 07/25/13 1034  NA 130* 126* 131* 138 138  K 4.8 4.6  3.9 4.0 3.7  CL 91* 87* 93* 100 101  CO2 25 25 22 26 25   GLUCOSE 100* 85 76 125* 107*  BUN 21 19 12 8  5*  CREATININE 1.30* 1.17* 1.06 0.91 0.87  CALCIUM 9.0 9.0 8.5 8.5 8.8  MG  --  2.0  --   --   --   PHOS  --  2.5  --   --   --    Liver Function Tests:  Recent Labs Lab 07/22/13 1659 07/23/13 0445  AST 27 21  ALT 29 23  ALKPHOS 70 89  BILITOT 0.6 0.5  PROT 6.9 6.2  ALBUMIN 3.2* 2.7*   No results found for this basename: LIPASE, AMYLASE,  in the last 168 hours No results found for this basename: AMMONIA,  in the last 168 hours CBC:  Recent Labs Lab 07/22/13 1141 07/22/13 1659 07/23/13 0445 07/24/13 0545 07/25/13 1034 07/26/13 0416  WBC 20.6* 17.3* 15.8* 16.6* 14.9* 15.1*  NEUTROABS 17.8* 15.2*  --   --   --   --   HGB 13.4 13.1 12.1 12.9 12.5 11.6*  HCT 38.9 39.3 36.3 38.6 37.9 35.4*  MCV 93.5 94.5 94.8 95.8 95.7 96.2  PLT 194 183 175 194 239 228   Cardiac Enzymes:  Recent Labs Lab 07/22/13 1141  TROPONINI <0.30   BNP (last 3 results)  Recent Labs  07/24/13 1440  PROBNP 1142.0*  CBG:  Recent Labs Lab 07/23/13 0629 07/25/13 0600 07/26/13 0625  GLUCAP 77 101* 93    Recent Results (from the past 240 hour(s))  URINE CULTURE     Status: None   Collection Time    07/22/13 12:27 PM      Result Value Ref Range Status   Specimen Description URINE, CLEAN CATCH   Final   Special Requests NONE   Final   Culture  Setup Time     Final   Value: 07/22/2013 17:12     Performed at [place]     Final   Value: >=100,000 COLONIES/ML     Performed at [facility]   Culture     Final   Value: Multiple bacterial morphotypes present, none predominant. Suggest appropriate recollection if clinically indicated.     Performed at [facility]   Report Status 07/23/2013 FINAL   Final  URINE CULTURE     Status: None   Collection Time    07/22/13  9:35 PM      Result Value Ref Range Status   Specimen Description URINE,  CLEAN CATCH   Final   Special Requests NONE   Final   Culture  Setup Time     Final   Value: 07/22/2013 21:59     Performed at [place]     Final   Value: 8,000 COLONIES/ML     Performed at [facility]   Culture     Final   Value: INSIGNIFICANT GROWTH     Performed at [facility]   Report Status 07/23/2013 FINAL   Final  CULTURE, BLOOD (ROUTINE X 2)     Status: None   Collection Time    07/24/13 11:45 AM      Result Value Ref Range Status   Specimen Description BLOOD RIGHT ANTECUBITAL   Final   Special Requests BOTTLES DRAWN AEROBIC ONLY 10CC   Final   Culture  Setup Time     Final   Value: 07/24/2013 18:30     Performed at [facility]   Culture     Final   Value:        BLOOD CULTURE RECEIVED NO GROWTH TO DATE CULTURE WILL BE HELD FOR 5 DAYS BEFORE ISSUING A FINAL NEGATIVE REPORT     Performed at [facility]   Report Status PENDING   Incomplete  CULTURE, BLOOD (ROUTINE X 2)     Status: None   Collection Time    07/24/13 11:50 AM      Result Value Ref Range Status   Specimen Description BLOOD RIGHT HAND   Final   Special Requests BOTTLES DRAWN AEROBIC ONLY 5CC   Final   Culture  Setup Time     Final   Value: 07/24/2013 18:30     Performed at [facility]   Culture     Final   Value:        BLOOD CULTURE RECEIVED NO GROWTH TO DATE CULTURE WILL BE HELD FOR 5 DAYS BEFORE ISSUING A FINAL NEGATIVE REPORT     Performed at [facility]   Report Status PENDING   Incomplete  RESPIRATORY VIRUS PANEL     Status: None   Collection Time    07/24/13  3:10 PM      Result Value Ref Range Status   Source - RVPAN NASAL SWAB   Corrected  Comment: CORRECTED ON 02/16 AT 1809: PREVIOUSLY REPORTED AS NASAL SWAB   Respiratory Syncytial Virus A NOT DETECTED   Final   Respiratory Syncytial Virus B NOT DETECTED   Final   Influenza A NOT DETECTED   Final   Influenza B NOT DETECTED   Final   Parainfluenza 1 NOT  DETECTED   Final   Parainfluenza 2 NOT DETECTED   Final   Parainfluenza 3 NOT DETECTED   Final   Metapneumovirus NOT DETECTED   Final   Rhinovirus NOT DETECTED   Final   Adenovirus NOT DETECTED   Final   Influenza A H1 NOT DETECTED   Final   Influenza A H3 NOT DETECTED   Final   Comment: (NOTE)           Normal Reference Range for each Analyte: NOT DETECTED     Testing performed using the Luminex xTAG Respiratory Viral Panel test     kit.     This test was developed and its performance characteristics determined     by [facility]. It has not been cleared or approved by the [place]     Food and Drug Administration. This test is used for clinical purposes.     It should not be regarded as investigational or for research. This     laboratory is certified under the [place] (CLIA) as qualified to perform high complexity     clinical laboratory testing.     Performed at [place], EXPECTORATED SPUTUM-ASSESSMENT     Status: None   Collection Time    07/24/13  4:13 PM      Result Value Ref Range Status   Specimen Description SPUTUM   Final   Special Requests NONE   Final   Sputum evaluation     Final   Value: THIS SPECIMEN IS ACCEPTABLE. RESPIRATORY CULTURE REPORT TO FOLLOW.   Report Status 07/24/2013 FINAL   Final  CULTURE, RESPIRATORY (NON-EXPECTORATED)     Status: None   Collection Time    07/24/13  4:13 PM      Result Value Ref Range Status   Specimen Description SPUTUM   Final   Special Requests NONE   Final   Gram Stain     Final   Value: FEW WBC PRESENT, PREDOMINANTLY PMN     RARE SQUAMOUS EPITHELIAL CELLS PRESENT     FEW GRAM POSITIVE RODS     FEW GRAM POSITIVE COCCI IN PAIRS     Performed at [facility]   Culture     Final   Value: NORMAL OROPHARYNGEAL FLORA     Performed at [facility]   Report Status 07/26/2013 FINAL   Final     Studies: Dg Chest 2 View  07/25/2013   CLINICAL  DATA:  Pneumonia.  Cough.  EXAM: CHEST  2 VIEW  COMPARISON:  CT ANGIO CHEST W/CM &/OR WO/CM dated 07/24/2013; DG CHEST 2 VIEW dated 07/22/2013  FINDINGS: Right upper lobe and right lower lobe pulmonary infiltrates noted consistent with severe pneumonia. Similar findings noted on prior chest CT. Left lung is clear. Heart size normal. Right-sided pleural effusion. No pneumothorax. Prior cervical spine fusion.  IMPRESSION: 1. Persistent severe right upper an right lower lobe pulmonary infiltrates consistent with pneumonia. 2. Small right pleural effusion.   Electronically Signed   By: Marcello Moores  Register   On: 07/25/2013 15:55    Scheduled Meds: .  aspirin EC  81 mg Oral Daily  . buPROPion  150 mg Oral Daily  . celecoxib  200 mg Oral BID  . cholecalciferol  5,000 Units Oral Daily  . docusate sodium  100 mg Oral BID  . flecainide  100 mg Oral Q12H  . HYDROmorphone HCl  16 mg Oral BID  . levalbuterol  0.63 mg Nebulization Q6H  . levofloxacin  750 mg Oral Daily  . NONFORMULARY OR COMPOUNDED ITEM 1 mL  1 mL Topical BID  . polyethylene glycol  17 g Oral Daily  . progesterone  100 mg Oral Daily  . pseudoephedrine  120 mg Oral Daily  . [START ON 07/30/2013] Testosterone Propionate  0.25 g Transdermal Once per day on Sun Sat  . [START ON 07/28/2013] Testosterone Propionate  0.5 g Transdermal Once per day on Tue Thu  . thyroid  90 mg Oral QAC breakfast   Continuous Infusions: . sodium chloride 100 mL/hr at 07/25/13 0600    Principal Problem:   Weakness Active Problems:   Atrial fibrillation   UTI (urinary tract infection)   HTN (hypertension)   Leukocytosis   Hyponatremia   Acute renal failure   CAP (community acquired pneumonia)   COPD (chronic obstructive pulmonary disease)   Tobacco use   Pneumonia   Lung mass   Pleural effusion   Pleuritis    Time spent: 25 minutes. Greater than 50% of this time was spent in direct contact with the patient coordinating care.    Lelon Frohlich  [facility] Pager [phone]  If 7PM-7AM, please contact night-coverage at [email or web address], password [facility] 07/26/2013, 1:56 PM  LOS: 4 days

## 2013-07-27 DIAGNOSIS — I369 Nonrheumatic tricuspid valve disorder, unspecified: Secondary | ICD-10-CM

## 2013-07-27 DIAGNOSIS — R5383 Other fatigue: Secondary | ICD-10-CM

## 2013-07-27 DIAGNOSIS — R5381 Other malaise: Secondary | ICD-10-CM

## 2013-07-27 DIAGNOSIS — I959 Hypotension, unspecified: Secondary | ICD-10-CM

## 2013-07-27 LAB — CBC
HCT: 33.1 % — ABNORMAL LOW (ref 36.0–46.0)
Hemoglobin: 11 g/dL — ABNORMAL LOW (ref 12.0–15.0)
MCH: 31.9 pg (ref 26.0–34.0)
MCHC: 33.2 g/dL (ref 30.0–36.0)
MCV: 95.9 fL (ref 78.0–100.0)
PLATELETS: 224 10*3/uL (ref 150–400)
RBC: 3.45 MIL/uL — ABNORMAL LOW (ref 3.87–5.11)
RDW: 14.4 % (ref 11.5–15.5)
WBC: 16.2 10*3/uL — ABNORMAL HIGH (ref 4.0–10.5)

## 2013-07-27 LAB — BASIC METABOLIC PANEL
BUN: 5 mg/dL — ABNORMAL LOW (ref 6–23)
CO2: 25 mEq/L (ref 19–32)
Calcium: 8.4 mg/dL (ref 8.4–10.5)
Chloride: 102 mEq/L (ref 96–112)
Creatinine, Ser: 0.77 mg/dL (ref 0.50–1.10)
GFR, EST NON AFRICAN AMERICAN: 89 mL/min — AB (ref >90)
Glucose, Bld: 101 mg/dL — ABNORMAL HIGH (ref 70–99)
POTASSIUM: 3.9 meq/L (ref 3.7–5.3)
SODIUM: 136 meq/L — AB (ref 137–147)

## 2013-07-27 LAB — GLUCOSE, CAPILLARY: GLUCOSE-CAPILLARY: 85 mg/dL (ref 70–99)

## 2013-07-27 MED ORDER — GUAIFENESIN 100 MG/5ML PO SYRP
200.0000 mg | ORAL_SOLUTION | ORAL | Status: DC | PRN
  Administered 2013-07-27 – 2013-08-01 (×6): 200 mg via ORAL
  Filled 2013-07-27 (×7): qty 10

## 2013-07-27 NOTE — Progress Notes (Signed)
Echocardiogram 2D Echocardiogram has been performed.  Taylor [name] 07/27/2013, 2:58 PM

## 2013-07-27 NOTE — Progress Notes (Signed)
[facility] PROGRESS NOTE  Taylor [name] FBP:[id number] DOB: 1951-02-02 DOA: 07/22/2013 PCP: Raeanne Gathers, MD  Assessment/Plan: 63 y/o female with PMH of HTN, A fib not on AC, h/o tobacco use presented with weakness, hypotension found to have PNA  1. Generalized Weakness; Presumed related to UTI and PNA.. TSH/B12 ok.  -Seen by PT and no further needs identified.  2. UTI on atx -Cx with insignificant growth.  3. CAP/Acute Hypoxemic Respiratory Failure  -CT Chest with extensive right-sided PNA and mediastinal/hilar adenopathy; but cannot rule out underlying mass.  -Continue levaquin for 10 days total. Appreciate pulmonary input.  -Plan to treat for 10 days and repeat CT Chest in 4-6 weeks.  4. ARF Resolved with IVF.  5. Atrial Fibrillation  -Rate controlled. Not on AC  6. BL LE edema; hold IVF; obtain echo     Code Status: full Family Communication: d/w patient (indicate person spoken with, relationship, and if by phone, the number) Disposition Plan: home 24-48 hours    Consultants:  Pulmonology   Procedures:  None   Antibiotics: Cipro 2/13 >>> 2/14  Levaquin 2/15 >>>    (indicate start date, and stop date if known)  HPI/Subjective: alert  Objective: Filed Vitals:   07/27/13 0449  BP: 98/64  Pulse: 73  Temp: 97.8 F (36.6 C)  Resp: 18    Intake/Output Summary (Last 24 hours) at 07/27/13 1155 Last data filed at 07/27/13 1103  Gross per 24 hour  Intake 2156.67 ml  Output      0 ml  Net 2156.67 ml   Filed Weights   07/25/13 0500 07/26/13 0600 07/27/13 0502  Weight: 104.781 kg (231 lb) 108.183 kg (238 lb 8 oz) 108.364 kg (238 lb 14.4 oz)    Exam:   General:  Alert   Cardiovascular: s1,s2 rrr  Respiratory: R sided rales   Abdomen: soft, nt, nd  Musculoskeletal: LE edema   Data Reviewed: Basic Metabolic Panel:  Recent Labs Lab 07/22/13 1141 07/22/13 1659 07/23/13 0445 07/24/13 0545 07/25/13 1034 07/27/13 0625  NA 130* 126*  131* 138 138 136*  K 4.8 4.6 3.9 4.0 3.7 3.9  CL 91* 87* 93* 100 101 102  CO2 25 25 22 26 25 25   GLUCOSE 100* 85 76 125* 107* 101*  BUN 21 19 12 8  5* 5*  CREATININE 1.30* 1.17* 1.06 0.91 0.87 0.77  CALCIUM 9.0 9.0 8.5 8.5 8.8 8.4  MG  --  2.0  --   --   --   --   PHOS  --  2.5  --   --   --   --    Liver Function Tests:  Recent Labs Lab 07/22/13 1659 07/23/13 0445  AST 27 21  ALT 29 23  ALKPHOS 70 89  BILITOT 0.6 0.5  PROT 6.9 6.2  ALBUMIN 3.2* 2.7*   No results found for this basename: LIPASE, AMYLASE,  in the last 168 hours No results found for this basename: AMMONIA,  in the last 168 hours CBC:  Recent Labs Lab 07/22/13 1141 07/22/13 1659 07/23/13 0445 07/24/13 0545 07/25/13 1034 07/26/13 0416 07/27/13 0625  WBC 20.6* 17.3* 15.8* 16.6* 14.9* 15.1* 16.2*  NEUTROABS 17.8* 15.2*  --   --   --   --   --   HGB 13.4 13.1 12.1 12.9 12.5 11.6* 11.0*  HCT 38.9 39.3 36.3 38.6 37.9 35.4* 33.1*  MCV 93.5 94.5 94.8 95.8 95.7 96.2 95.9  PLT 194 183 175 194 239 228 224  Cardiac Enzymes:  Recent Labs Lab 07/22/13 1141  TROPONINI <0.30   BNP (last 3 results)  Recent Labs  07/24/13 1440  PROBNP 1142.0*   CBG:  Recent Labs Lab 07/23/13 0629 07/25/13 0600 07/26/13 0625 07/27/13 0646  GLUCAP 77 101* 93 85    Recent Results (from the past 240 hour(s))  URINE CULTURE     Status: None   Collection Time    07/22/13 12:27 PM      Result Value Ref Range Status   Specimen Description URINE, CLEAN CATCH   Final   Special Requests NONE   Final   Culture  Setup Time     Final   Value: 07/22/2013 17:12     Performed at [place]     Final   Value: >=100,000 COLONIES/ML     Performed at [facility]   Culture     Final   Value: Multiple bacterial morphotypes present, none predominant. Suggest appropriate recollection if clinically indicated.     Performed at [facility]   Report Status 07/23/2013 FINAL   Final  URINE  CULTURE     Status: None   Collection Time    07/22/13  9:35 PM      Result Value Ref Range Status   Specimen Description URINE, CLEAN CATCH   Final   Special Requests NONE   Final   Culture  Setup Time     Final   Value: 07/22/2013 21:59     Performed at [place]     Final   Value: 8,000 COLONIES/ML     Performed at [facility]   Culture     Final   Value: INSIGNIFICANT GROWTH     Performed at [facility]   Report Status 07/23/2013 FINAL   Final  CULTURE, BLOOD (ROUTINE X 2)     Status: None   Collection Time    07/24/13 11:45 AM      Result Value Ref Range Status   Specimen Description BLOOD RIGHT ANTECUBITAL   Final   Special Requests BOTTLES DRAWN AEROBIC ONLY 10CC   Final   Culture  Setup Time     Final   Value: 07/24/2013 18:30     Performed at [facility]   Culture     Final   Value:        BLOOD CULTURE RECEIVED NO GROWTH TO DATE CULTURE WILL BE HELD FOR 5 DAYS BEFORE ISSUING A FINAL NEGATIVE REPORT     Performed at [facility]   Report Status PENDING   Incomplete  CULTURE, BLOOD (ROUTINE X 2)     Status: None   Collection Time    07/24/13 11:50 AM      Result Value Ref Range Status   Specimen Description BLOOD RIGHT HAND   Final   Special Requests BOTTLES DRAWN AEROBIC ONLY 5CC   Final   Culture  Setup Time     Final   Value: 07/24/2013 18:30     Performed at [facility]   Culture     Final   Value:        BLOOD CULTURE RECEIVED NO GROWTH TO DATE CULTURE WILL BE HELD FOR 5 DAYS BEFORE ISSUING A FINAL NEGATIVE REPORT     Performed at [facility]   Report Status PENDING   Incomplete  RESPIRATORY VIRUS PANEL     Status: None  Collection Time    07/24/13  3:10 PM      Result Value Ref Range Status   Source - RVPAN NASAL SWAB   Corrected   Comment: CORRECTED ON 02/16 AT 1809: PREVIOUSLY REPORTED AS NASAL SWAB   Respiratory Syncytial Virus A NOT DETECTED   Final   Respiratory  Syncytial Virus B NOT DETECTED   Final   Influenza A NOT DETECTED   Final   Influenza B NOT DETECTED   Final   Parainfluenza 1 NOT DETECTED   Final   Parainfluenza 2 NOT DETECTED   Final   Parainfluenza 3 NOT DETECTED   Final   Metapneumovirus NOT DETECTED   Final   Rhinovirus NOT DETECTED   Final   Adenovirus NOT DETECTED   Final   Influenza A H1 NOT DETECTED   Final   Influenza A H3 NOT DETECTED   Final   Comment: (NOTE)           Normal Reference Range for each Analyte: NOT DETECTED     Testing performed using the Luminex xTAG Respiratory Viral Panel test     kit.     This test was developed and its performance characteristics determined     by [facility]. It has not been cleared or approved by the [place]     Food and Drug Administration. This test is used for clinical purposes.     It should not be regarded as investigational or for research. This     laboratory is certified under the [place] (CLIA) as qualified to perform high complexity     clinical laboratory testing.     Performed at [place], EXPECTORATED SPUTUM-ASSESSMENT     Status: None   Collection Time    07/24/13  4:13 PM      Result Value Ref Range Status   Specimen Description SPUTUM   Final   Special Requests NONE   Final   Sputum evaluation     Final   Value: THIS SPECIMEN IS ACCEPTABLE. RESPIRATORY CULTURE REPORT TO FOLLOW.   Report Status 07/24/2013 FINAL   Final  CULTURE, RESPIRATORY (NON-EXPECTORATED)     Status: None   Collection Time    07/24/13  4:13 PM      Result Value Ref Range Status   Specimen Description SPUTUM   Final   Special Requests NONE   Final   Gram Stain     Final   Value: FEW WBC PRESENT, PREDOMINANTLY PMN     RARE SQUAMOUS EPITHELIAL CELLS PRESENT     FEW GRAM POSITIVE RODS     FEW GRAM POSITIVE COCCI IN PAIRS     Performed at [facility]   Culture     Final   Value: NORMAL OROPHARYNGEAL FLORA      Performed at [facility]   Report Status 07/26/2013 FINAL   Final     Studies: Dg Chest 2 View  07/25/2013   CLINICAL DATA:  Pneumonia.  Cough.  EXAM: CHEST  2 VIEW  COMPARISON:  CT ANGIO CHEST W/CM &/OR WO/CM dated 07/24/2013; DG CHEST 2 VIEW dated 07/22/2013  FINDINGS: Right upper lobe and right lower lobe pulmonary infiltrates noted consistent with severe pneumonia. Similar findings noted on prior chest CT. Left lung is clear. Heart size normal. Right-sided pleural effusion. No pneumothorax. Prior cervical spine fusion.  IMPRESSION: 1. Persistent severe right upper an right lower lobe  pulmonary infiltrates consistent with pneumonia. 2. Small right pleural effusion.   Electronically Signed   By: Marcello Moores  Register   On: 07/25/2013 15:55    Scheduled Meds: . aspirin EC  81 mg Oral Daily  . buPROPion  150 mg Oral Daily  . celecoxib  200 mg Oral BID  . cholecalciferol  5,000 Units Oral Daily  . docusate sodium  100 mg Oral BID  . flecainide  100 mg Oral Q12H  . HYDROmorphone HCl  16 mg Oral BID  . levalbuterol  0.63 mg Nebulization Q6H  . levofloxacin  750 mg Oral Daily  . NONFORMULARY OR COMPOUNDED ITEM 1 mL  1 mL Topical BID  . polyethylene glycol  17 g Oral Daily  . progesterone  100 mg Oral Daily  . pseudoephedrine  120 mg Oral Daily  . [START ON 07/30/2013] Testosterone Propionate  0.25 g Transdermal Once per day on Sun Sat  . [START ON 07/28/2013] Testosterone Propionate  0.5 g Transdermal Once per day on Tue Thu  . thyroid  90 mg Oral QAC breakfast   Continuous Infusions: . sodium chloride 100 mL/hr at 07/27/13 5521    Principal Problem:   Weakness Active Problems:   Atrial fibrillation   UTI (urinary tract infection)   HTN (hypertension)   Leukocytosis   Hyponatremia   Acute renal failure   CAP (community acquired pneumonia)   COPD (chronic obstructive pulmonary disease)   Tobacco use   Pneumonia   Lung mass   Pleural effusion   Pleuritis    Time spent: >35  minuets     Kinnie Feil  [facility] Pager [phone]. If 7PM-7AM, please contact night-coverage at [email or web address], password [facility] 07/27/2013, 11:55 AM  LOS: 5 days

## 2013-07-27 NOTE — Progress Notes (Signed)
ANTIBIOTIC CONSULT NOTE - INITIAL  Pharmacy Consult for Levofloxacin Indication: pneumonia / ?UTI  Allergies  Allergen Reactions  . Penicillins Itching and Rash    Patient Measurements: Height: 5' 6"  (167.6 cm) Weight: 238 lb 14.4 oz (108.364 kg) IBW/kg (Calculated) : 59.3 Adjusted Body Weight: n/a  Vital Signs: Temp: 97.8 F (36.6 C) (02/18 0449) Temp src: Oral (02/18 0449) BP: 98/64 mmHg (02/18 0449) Pulse Rate: 73 (02/18 0449) Intake/Output from previous day: 02/17 0701 - 02/18 0700 In: 2396.7 [P.O.:1180; I.V.:1216.7] Out: -  Intake/Output from this shift: Total I/O In: 240 [P.O.:240] Out: -   Labs:  Recent Labs  07/25/13 1034 07/26/13 0416 07/27/13 0625  WBC 14.9* 15.1* 16.2*  HGB 12.5 11.6* 11.0*  PLT 239 228 224  CREATININE 0.87  --  0.77   Estimated Creatinine Clearance: 93.1 ml/min (by C-G formula based on Cr of 0.77). No results found for this basename: VANCOTROUGH, Corlis Leak, VANCORANDOM, GENTTROUGH, GENTPEAK, GENTRANDOM, TOBRATROUGH, TOBRAPEAK, TOBRARND, AMIKACINPEAK, AMIKACINTROU, AMIKACIN,  in the last 72 hours   Microbiology: Recent Results (from the past 720 hour(s))  URINE CULTURE     Status: None   Collection Time    07/22/13 12:27 PM      Result Value Ref Range Status   Specimen Description URINE, CLEAN CATCH   Final   Special Requests NONE   Final   Culture  Setup Time     Final   Value: 07/22/2013 17:12     Performed at [place]     Final   Value: >=100,000 COLONIES/ML     Performed at [facility]   Culture     Final   Value: Multiple bacterial morphotypes present, none predominant. Suggest appropriate recollection if clinically indicated.     Performed at [facility]   Report Status 07/23/2013 FINAL   Final  URINE CULTURE     Status: None   Collection Time    07/22/13  9:35 PM      Result Value Ref Range Status   Specimen Description URINE, CLEAN CATCH   Final   Special Requests NONE    Final   Culture  Setup Time     Final   Value: 07/22/2013 21:59     Performed at [facility]     Final   Value: 8,000 COLONIES/ML     Performed at [facility]   Culture     Final   Value: INSIGNIFICANT GROWTH     Performed at [facility]   Report Status 07/23/2013 FINAL   Final  CULTURE, BLOOD (ROUTINE X 2)     Status: None   Collection Time    07/24/13 11:45 AM      Result Value Ref Range Status   Specimen Description BLOOD RIGHT ANTECUBITAL   Final   Special Requests BOTTLES DRAWN AEROBIC ONLY 10CC   Final   Culture  Setup Time     Final   Value: 07/24/2013 18:30     Performed at [facility]   Culture     Final   Value:        BLOOD CULTURE RECEIVED NO GROWTH TO DATE CULTURE WILL BE HELD FOR 5 DAYS BEFORE ISSUING A FINAL NEGATIVE REPORT     Performed at [facility]   Report Status PENDING   Incomplete  CULTURE, BLOOD (ROUTINE X 2)     Status: None   Collection Time  07/24/13 11:50 AM      Result Value Ref Range Status   Specimen Description BLOOD RIGHT HAND   Final   Special Requests BOTTLES DRAWN AEROBIC ONLY 5CC   Final   Culture  Setup Time     Final   Value: 07/24/2013 18:30     Performed at [facility]   Culture     Final   Value:        BLOOD CULTURE RECEIVED NO GROWTH TO DATE CULTURE WILL BE HELD FOR 5 DAYS BEFORE ISSUING A FINAL NEGATIVE REPORT     Performed at [facility]   Report Status PENDING   Incomplete  RESPIRATORY VIRUS PANEL     Status: None   Collection Time    07/24/13  3:10 PM      Result Value Ref Range Status   Source - RVPAN NASAL SWAB   Corrected   Comment: CORRECTED ON 02/16 AT 1809: PREVIOUSLY REPORTED AS NASAL SWAB   Respiratory Syncytial Virus A NOT DETECTED   Final   Respiratory Syncytial Virus B NOT DETECTED   Final   Influenza A NOT DETECTED   Final   Influenza B NOT DETECTED   Final   Parainfluenza 1 NOT DETECTED   Final   Parainfluenza 2 NOT DETECTED    Final   Parainfluenza 3 NOT DETECTED   Final   Metapneumovirus NOT DETECTED   Final   Rhinovirus NOT DETECTED   Final   Adenovirus NOT DETECTED   Final   Influenza A H1 NOT DETECTED   Final   Influenza A H3 NOT DETECTED   Final   Comment: (NOTE)           Normal Reference Range for each Analyte: NOT DETECTED     Testing performed using the Luminex xTAG Respiratory Viral Panel test     kit.     This test was developed and its performance characteristics determined     by [facility]. It has not been cleared or approved by the [place]     Food and Drug Administration. This test is used for clinical purposes.     It should not be regarded as investigational or for research. This     laboratory is certified under the [place] (CLIA) as qualified to perform high complexity     clinical laboratory testing.     Performed at [place], EXPECTORATED SPUTUM-ASSESSMENT     Status: None   Collection Time    07/24/13  4:13 PM      Result Value Ref Range Status   Specimen Description SPUTUM   Final   Special Requests NONE   Final   Sputum evaluation     Final   Value: THIS SPECIMEN IS ACCEPTABLE. RESPIRATORY CULTURE REPORT TO FOLLOW.   Report Status 07/24/2013 FINAL   Final  CULTURE, RESPIRATORY (NON-EXPECTORATED)     Status: None   Collection Time    07/24/13  4:13 PM      Result Value Ref Range Status   Specimen Description SPUTUM   Final   Special Requests NONE   Final   Gram Stain     Final   Value: FEW WBC PRESENT, PREDOMINANTLY PMN     RARE SQUAMOUS EPITHELIAL CELLS PRESENT     FEW GRAM POSITIVE RODS     FEW GRAM POSITIVE COCCI IN PAIRS     Performed at [facility]   Culture     Final   Value: NORMAL OROPHARYNGEAL FLORA     Performed at [facility]   Report Status 07/26/2013 FINAL   Final   Assessment: 42 YOF on D#4/10 levaquin for CAP / ?UTI. Pt. Has been afebrile, but WBC is still elevated  at 16.2 today, remains on O2 with increased requirement today. CT Chest with extensive right-sided PNA and mediastinal/hilar adenopathy; but cannot rule out underlying mass. Renal function improved scr 0.77, est. crcl > 90 ml/min.   Levaquin 2/15>> (2/24) complete 10 day course per CCM Cipro 2/13 >> 2/15   2/15 Blood Cx x2>> ngtd 2/13 Urine Cx>> 8000 colonies insignificant growth 2/13 Urine Cx >100K colonies but multiple bacteria (usualy colonization) 2/15 resp viral panel > neg 2/15 sputum > neg  Goal of Therapy:  Eradication of infection  Plan:  1) Continue Levaquin 750 mg po Q 24 hours through 2/24 2) Monitor CBC, cultures, renal fx and patient clinical progress 3) F/u LOT of abx therapy   Albertina Parr, PharmD.  Clinical Pharmacist Pager [phone]

## 2013-07-28 ENCOUNTER — Inpatient Hospital Stay (HOSPITAL_COMMUNITY): Payer: BC Managed Care – PPO

## 2013-07-28 LAB — EXPECTORATED SPUTUM ASSESSMENT W GRAM STAIN, RFLX TO RESP C

## 2013-07-28 LAB — GLUCOSE, CAPILLARY: GLUCOSE-CAPILLARY: 124 mg/dL — AB (ref 70–99)

## 2013-07-28 MED ORDER — FUROSEMIDE 10 MG/ML IJ SOLN
20.0000 mg | Freq: Two times a day (BID) | INTRAMUSCULAR | Status: DC
  Administered 2013-07-28 – 2013-08-01 (×9): 20 mg via INTRAVENOUS
  Filled 2013-07-28 (×12): qty 2

## 2013-07-28 MED ORDER — VANCOMYCIN HCL IN DEXTROSE 1-5 GM/200ML-% IV SOLN
1000.0000 mg | Freq: Two times a day (BID) | INTRAVENOUS | Status: DC
  Administered 2013-07-28 – 2013-07-30 (×4): 1000 mg via INTRAVENOUS
  Filled 2013-07-28 (×5): qty 200

## 2013-07-28 MED ORDER — DEXTROSE 5 % IV SOLN
2.0000 g | Freq: Two times a day (BID) | INTRAVENOUS | Status: DC
  Administered 2013-07-28 – 2013-08-01 (×9): 2 g via INTRAVENOUS
  Filled 2013-07-28 (×12): qty 2

## 2013-07-28 MED ORDER — METOPROLOL TARTRATE 12.5 MG HALF TABLET
12.5000 mg | ORAL_TABLET | Freq: Two times a day (BID) | ORAL | Status: DC
  Administered 2013-07-28 – 2013-08-01 (×7): 12.5 mg via ORAL
  Filled 2013-07-28 (×10): qty 1

## 2013-07-28 MED ORDER — POTASSIUM CHLORIDE CRYS ER 20 MEQ PO TBCR
20.0000 meq | EXTENDED_RELEASE_TABLET | Freq: Two times a day (BID) | ORAL | Status: DC
  Administered 2013-07-28 – 2013-08-01 (×9): 20 meq via ORAL
  Filled 2013-07-28 (×11): qty 1

## 2013-07-28 MED ORDER — MOMETASONE FURO-FORMOTEROL FUM 100-5 MCG/ACT IN AERO
2.0000 | INHALATION_SPRAY | Freq: Two times a day (BID) | RESPIRATORY_TRACT | Status: DC
  Administered 2013-07-28 – 2013-08-01 (×9): 2 via RESPIRATORY_TRACT
  Filled 2013-07-28: qty 8.8

## 2013-07-28 MED ORDER — VANCOMYCIN HCL 10 G IV SOLR
1500.0000 mg | Freq: Once | INTRAVENOUS | Status: AC
  Administered 2013-07-28: 1500 mg via INTRAVENOUS
  Filled 2013-07-28: qty 1500

## 2013-07-28 NOTE — Progress Notes (Signed)
ANTIBIOTIC CONSULT NOTE - FOLLOW UP  Pharmacy Consult for vanc/cefepime Indication: pneumonia  Allergies  Allergen Reactions  . Penicillins Itching and Rash    Patient Measurements: Height: 5' 6"  (167.6 cm) Weight: 238 lb 14.4 oz (108.364 kg) IBW/kg (Calculated) : 59.3 Adjusted Body Weight:   Vital Signs: Temp: 98.8 F (37.1 C) (02/19 0538) Temp src: Oral (02/19 0538) BP: 115/60 mmHg (02/19 0538) Pulse Rate: 110 (02/19 0538) Intake/Output from previous day: 02/18 0701 - 02/19 0700 In: 480 [P.O.:480] Out: -  Intake/Output from this shift:    Labs:  Recent Labs  07/25/13 1034 07/26/13 0416 07/27/13 0625  WBC 14.9* 15.1* 16.2*  HGB 12.5 11.6* 11.0*  PLT 239 228 224  CREATININE 0.87  --  0.77   Estimated Creatinine Clearance: 93.1 ml/min (by C-G formula based on Cr of 0.77). No results found for this basename: VANCOTROUGH, Corlis Leak, VANCORANDOM, GENTTROUGH, GENTPEAK, GENTRANDOM, TOBRATROUGH, TOBRAPEAK, TOBRARND, AMIKACINPEAK, AMIKACINTROU, AMIKACIN,  in the last 72 hours   Microbiology: Recent Results (from the past 720 hour(s))  URINE CULTURE     Status: None   Collection Time    07/22/13 12:27 PM      Result Value Ref Range Status   Specimen Description URINE, CLEAN CATCH   Final   Special Requests NONE   Final   Culture  Setup Time     Final   Value: 07/22/2013 17:12     Performed at [place]     Final   Value: >=100,000 COLONIES/ML     Performed at [facility]   Culture     Final   Value: Multiple bacterial morphotypes present, none predominant. Suggest appropriate recollection if clinically indicated.     Performed at [facility]   Report Status 07/23/2013 FINAL   Final  URINE CULTURE     Status: None   Collection Time    07/22/13  9:35 PM      Result Value Ref Range Status   Specimen Description URINE, CLEAN CATCH   Final   Special Requests NONE   Final   Culture  Setup Time     Final   Value:  07/22/2013 21:59     Performed at [place]     Final   Value: 8,000 COLONIES/ML     Performed at [facility]   Culture     Final   Value: INSIGNIFICANT GROWTH     Performed at [facility]   Report Status 07/23/2013 FINAL   Final  CULTURE, BLOOD (ROUTINE X 2)     Status: None   Collection Time    07/24/13 11:45 AM      Result Value Ref Range Status   Specimen Description BLOOD RIGHT ANTECUBITAL   Final   Special Requests BOTTLES DRAWN AEROBIC ONLY 10CC   Final   Culture  Setup Time     Final   Value: 07/24/2013 18:30     Performed at [facility]   Culture     Final   Value:        BLOOD CULTURE RECEIVED NO GROWTH TO DATE CULTURE WILL BE HELD FOR 5 DAYS BEFORE ISSUING A FINAL NEGATIVE REPORT     Performed at [facility]   Report Status PENDING   Incomplete  CULTURE, BLOOD (ROUTINE X 2)     Status: None   Collection Time    07/24/13 11:50 AM  Result Value Ref Range Status   Specimen Description BLOOD RIGHT HAND   Final   Special Requests BOTTLES DRAWN AEROBIC ONLY 5CC   Final   Culture  Setup Time     Final   Value: 07/24/2013 18:30     Performed at [facility]   Culture     Final   Value:        BLOOD CULTURE RECEIVED NO GROWTH TO DATE CULTURE WILL BE HELD FOR 5 DAYS BEFORE ISSUING A FINAL NEGATIVE REPORT     Performed at [facility]   Report Status PENDING   Incomplete  RESPIRATORY VIRUS PANEL     Status: None   Collection Time    07/24/13  3:10 PM      Result Value Ref Range Status   Source - RVPAN NASAL SWAB   Corrected   Comment: CORRECTED ON 02/16 AT 1809: PREVIOUSLY REPORTED AS NASAL SWAB   Respiratory Syncytial Virus A NOT DETECTED   Final   Respiratory Syncytial Virus B NOT DETECTED   Final   Influenza A NOT DETECTED   Final   Influenza B NOT DETECTED   Final   Parainfluenza 1 NOT DETECTED   Final   Parainfluenza 2 NOT DETECTED   Final   Parainfluenza 3 NOT DETECTED   Final    Metapneumovirus NOT DETECTED   Final   Rhinovirus NOT DETECTED   Final   Adenovirus NOT DETECTED   Final   Influenza A H1 NOT DETECTED   Final   Influenza A H3 NOT DETECTED   Final   Comment: (NOTE)           Normal Reference Range for each Analyte: NOT DETECTED     Testing performed using the Luminex xTAG Respiratory Viral Panel test     kit.     This test was developed and its performance characteristics determined     by [facility]. It has not been cleared or approved by the [place]     Food and Drug Administration. This test is used for clinical purposes.     It should not be regarded as investigational or for research. This     laboratory is certified under the [place] (CLIA) as qualified to perform high complexity     clinical laboratory testing.     Performed at [place], EXPECTORATED SPUTUM-ASSESSMENT     Status: None   Collection Time    07/24/13  4:13 PM      Result Value Ref Range Status   Specimen Description SPUTUM   Final   Special Requests NONE   Final   Sputum evaluation     Final   Value: THIS SPECIMEN IS ACCEPTABLE. RESPIRATORY CULTURE REPORT TO FOLLOW.   Report Status 07/24/2013 FINAL   Final  CULTURE, RESPIRATORY (NON-EXPECTORATED)     Status: None   Collection Time    07/24/13  4:13 PM      Result Value Ref Range Status   Specimen Description SPUTUM   Final   Special Requests NONE   Final   Gram Stain     Final   Value: FEW WBC PRESENT, PREDOMINANTLY PMN     RARE SQUAMOUS EPITHELIAL CELLS PRESENT     FEW GRAM POSITIVE RODS     FEW GRAM POSITIVE COCCI IN PAIRS     Performed at [facility]  Final   Value: NORMAL OROPHARYNGEAL FLORA     Performed at [facility]   Report Status 07/26/2013 FINAL   Final    Anti-infectives   Start     Dose/Rate Route Frequency Ordered Stop   07/28/13 1000  vancomycin (VANCOCIN) 1,500 mg in sodium chloride 0.9 % 500 mL  IVPB     1,500 mg 250 mL/hr over 120 Minutes Intravenous  Once 07/28/13 0936     07/28/13 1000  ceFEPIme (MAXIPIME) 2 g in dextrose 5 % 50 mL IVPB     2 g 100 mL/hr over 30 Minutes Intravenous Every 12 hours 07/28/13 0944     07/26/13 1330  levofloxacin (LEVAQUIN) tablet 750 mg  Status:  Discontinued     750 mg Oral Daily 07/26/13 1215 07/28/13 0919   07/24/13 1500  levofloxacin (LEVAQUIN) IVPB 750 mg  Status:  Discontinued     750 mg 100 mL/hr over 90 Minutes Intravenous Every 24 hours 07/24/13 1415 07/26/13 1215   07/24/13 1000  levofloxacin (LEVAQUIN) tablet 750 mg  Status:  Discontinued     750 mg Oral Daily 07/24/13 0942 07/24/13 1343   07/22/13 1700  ciprofloxacin (CIPRO) IVPB 400 mg  Status:  Discontinued     400 mg 200 mL/hr over 60 Minutes Intravenous Every 24 hours 07/22/13 1532 07/24/13 0942   07/22/13 1515  ciprofloxacin (CIPRO) IVPB 400 mg  Status:  Discontinued     400 mg 200 mL/hr over 60 Minutes Intravenous Every 24 hours 07/22/13 1514 07/22/13 1532      Assessment: 63 yo who has been on levaquin for PNA. CXR worsened per MD so abx expanded to vanc/cefepime. All cultures have been neg for now. Her renal function has been good. Zosyn was ordered but she has rash to PCN. D/w with MD will change to cefepime since cross reactivity is very low   Goal of Therapy:  Vancomycin trough level 15-20 mcg/ml  Plan:   Vanc 1.5g IV x1 then 1g IV q12 Level in a few days if needed Cefepime 2g IV q12

## 2013-07-28 NOTE — Progress Notes (Signed)
Pt has increased dyspnea with activity.  When out of bed to bathroom wearing 4L [place] oxygen saturation levels in 80's.  At rest her O2 sats = low 90's. Pt states that she feels increasingly short of breath and she is concerned that her non-productive cough is not improving.  On call [facility] contacted, CXR ordered. Will continue to monitor patient and VS.

## 2013-07-28 NOTE — Progress Notes (Addendum)
[facility] PROGRESS NOTE  Taylor [name] JOA:[id number] DOB: [birth year] DOA: 07/22/2013 PCP: Raeanne Gathers, MD  Assessment/Plan: 63 y/o female with PMH of HTN, A fib not on AC, h/o tobacco use presented with weakness, hypotension found to have PNA  1. Generalized Weakness; Presumed related to UTI and PNA.. TSH/B12 ok.  -Seen by PT and no further needs identified.  2. UTI on atx; Cx with insignificant growth.  3. CAP/Acute Hypoxemic Respiratory Failure; CT Chest with extensive right-sided PNA and mediastinal/hilar adenopathy; but cannot rule out underlying mass. H/o smoking; added advair, inhalers  -still hypoxic, repeat CXR: increased infiltrates; d/c PO atx, start IV cefepime/vanc 2/19; sputum c/s   -Plan to treat for 10 days and repeat CT Chest in 4-6 weeks.  4. ARF Resolved with IVF. D/c nsaids, use tylenol for pain    5. Atrial Fibrillation  -Rate controlled. Not on AC, resume BB 6. CHF fluid overlord;  Echo: LVEF 55%, mild pulmonary HTN; patient takes lasix prn at home  -start lasix, replace lytes; daily weight, I/O'; monitor renal function     Code Status: full Family Communication: d/w patient, updated husband at t he bedside (indicate person spoken with, relationship, and if by phone, the number) Disposition Plan: home 24-48 hours    Consultants:  Pulmonology   Procedures:  None   Antibiotics: Cipro 2/13 >>> 2/14  Levaquin 2/15 >>>2/19 Vanc2/19<<<< Cefepime 2/19<<<<    (indicate start date, and stop date if known)  HPI/Subjective: alert  Objective: Filed Vitals:   07/28/13 0538  BP: 115/60  Pulse: 110  Temp: 98.8 F (37.1 C)  Resp: 18    Intake/Output Summary (Last 24 hours) at 07/28/13 0938 Last data filed at 07/28/13 0538  Gross per 24 hour  Intake    480 ml  Output      0 ml  Net    480 ml   Filed Weights   07/25/13 0500 07/26/13 0600 07/27/13 0502  Weight: 104.781 kg (231 lb) 108.183 kg (238 lb 8 oz) 108.364 kg (238 lb 14.4 oz)     Exam:   General:  Alert   Cardiovascular: s1,s2 rrr  Respiratory: R sided rales   Abdomen: soft, nt, nd  Musculoskeletal: LE edema   Data Reviewed: Basic Metabolic Panel:  Recent Labs Lab 07/22/13 1141 07/22/13 1659 07/23/13 0445 07/24/13 0545 07/25/13 1034 07/27/13 0625  NA 130* 126* 131* 138 138 136*  K 4.8 4.6 3.9 4.0 3.7 3.9  CL 91* 87* 93* 100 101 102  CO2 25 25 22 26 25 25   GLUCOSE 100* 85 76 125* 107* 101*  BUN 21 19 12 8  5* 5*  CREATININE 1.30* 1.17* 1.06 0.91 0.87 0.77  CALCIUM 9.0 9.0 8.5 8.5 8.8 8.4  MG  --  2.0  --   --   --   --   PHOS  --  2.5  --   --   --   --    Liver Function Tests:  Recent Labs Lab 07/22/13 1659 07/23/13 0445  AST 27 21  ALT 29 23  ALKPHOS 70 89  BILITOT 0.6 0.5  PROT 6.9 6.2  ALBUMIN 3.2* 2.7*   No results found for this basename: LIPASE, AMYLASE,  in the last 168 hours No results found for this basename: AMMONIA,  in the last 168 hours CBC:  Recent Labs Lab 07/22/13 1141 07/22/13 1659 07/23/13 0445 07/24/13 0545 07/25/13 1034 07/26/13 0416 07/27/13 0625  WBC 20.6* 17.3* 15.8* 16.6* 14.9* 15.1* 16.2*  NEUTROABS 17.8* 15.2*  --   --   --   --   --   HGB 13.4 13.1 12.1 12.9 12.5 11.6* 11.0*  HCT 38.9 39.3 36.3 38.6 37.9 35.4* 33.1*  MCV 93.5 94.5 94.8 95.8 95.7 96.2 95.9  PLT 194 183 175 194 239 228 224   Cardiac Enzymes:  Recent Labs Lab 07/22/13 1141  TROPONINI <0.30   BNP (last 3 results)  Recent Labs  07/24/13 1440  PROBNP 1142.0*   CBG:  Recent Labs Lab 07/23/13 0629 07/25/13 0600 07/26/13 0625 07/27/13 0646 07/28/13 0634  GLUCAP 77 101* 93 85 124*    Recent Results (from the past 240 hour(s))  URINE CULTURE     Status: None   Collection Time    07/22/13 12:27 PM      Result Value Ref Range Status   Specimen Description URINE, CLEAN CATCH   Final   Special Requests NONE   Final   Culture  Setup Time     Final   Value: 07/22/2013 17:12     Performed at [place]     Final   Value: >=100,000 COLONIES/ML     Performed at [facility]   Culture     Final   Value: Multiple bacterial morphotypes present, none predominant. Suggest appropriate recollection if clinically indicated.     Performed at [facility]   Report Status 07/23/2013 FINAL   Final  URINE CULTURE     Status: None   Collection Time    07/22/13  9:35 PM      Result Value Ref Range Status   Specimen Description URINE, CLEAN CATCH   Final   Special Requests NONE   Final   Culture  Setup Time     Final   Value: 07/22/2013 21:59     Performed at [place]     Final   Value: 8,000 COLONIES/ML     Performed at [facility]   Culture     Final   Value: INSIGNIFICANT GROWTH     Performed at [facility]   Report Status 07/23/2013 FINAL   Final  CULTURE, BLOOD (ROUTINE X 2)     Status: None   Collection Time    07/24/13 11:45 AM      Result Value Ref Range Status   Specimen Description BLOOD RIGHT ANTECUBITAL   Final   Special Requests BOTTLES DRAWN AEROBIC ONLY 10CC   Final   Culture  Setup Time     Final   Value: 07/24/2013 18:30     Performed at [facility]   Culture     Final   Value:        BLOOD CULTURE RECEIVED NO GROWTH TO DATE CULTURE WILL BE HELD FOR 5 DAYS BEFORE ISSUING A FINAL NEGATIVE REPORT     Performed at [facility]   Report Status PENDING   Incomplete  CULTURE, BLOOD (ROUTINE X 2)     Status: None   Collection Time    07/24/13 11:50 AM      Result Value Ref Range Status   Specimen Description BLOOD RIGHT HAND   Final   Special Requests BOTTLES DRAWN AEROBIC ONLY 5CC   Final   Culture  Setup Time     Final   Value: 07/24/2013 18:30     Performed at [facility]  Final   Value:        BLOOD CULTURE RECEIVED NO GROWTH TO DATE CULTURE WILL BE HELD FOR 5 DAYS BEFORE ISSUING A FINAL NEGATIVE REPORT     Performed at [facility]    Report Status PENDING   Incomplete  RESPIRATORY VIRUS PANEL     Status: None   Collection Time    07/24/13  3:10 PM      Result Value Ref Range Status   Source - RVPAN NASAL SWAB   Corrected   Comment: CORRECTED ON 02/16 AT 1809: PREVIOUSLY REPORTED AS NASAL SWAB   Respiratory Syncytial Virus A NOT DETECTED   Final   Respiratory Syncytial Virus B NOT DETECTED   Final   Influenza A NOT DETECTED   Final   Influenza B NOT DETECTED   Final   Parainfluenza 1 NOT DETECTED   Final   Parainfluenza 2 NOT DETECTED   Final   Parainfluenza 3 NOT DETECTED   Final   Metapneumovirus NOT DETECTED   Final   Rhinovirus NOT DETECTED   Final   Adenovirus NOT DETECTED   Final   Influenza A H1 NOT DETECTED   Final   Influenza A H3 NOT DETECTED   Final   Comment: (NOTE)           Normal Reference Range for each Analyte: NOT DETECTED     Testing performed using the Luminex xTAG Respiratory Viral Panel test     kit.     This test was developed and its performance characteristics determined     by [facility]. It has not been cleared or approved by the [place]     Food and Drug Administration. This test is used for clinical purposes.     It should not be regarded as investigational or for research. This     laboratory is certified under the [place] (CLIA) as qualified to perform high complexity     clinical laboratory testing.     Performed at [place], EXPECTORATED SPUTUM-ASSESSMENT     Status: None   Collection Time    07/24/13  4:13 PM      Result Value Ref Range Status   Specimen Description SPUTUM   Final   Special Requests NONE   Final   Sputum evaluation     Final   Value: THIS SPECIMEN IS ACCEPTABLE. RESPIRATORY CULTURE REPORT TO FOLLOW.   Report Status 07/24/2013 FINAL   Final  CULTURE, RESPIRATORY (NON-EXPECTORATED)     Status: None   Collection Time    07/24/13  4:13 PM      Result Value Ref Range Status   Specimen  Description SPUTUM   Final   Special Requests NONE   Final   Gram Stain     Final   Value: FEW WBC PRESENT, PREDOMINANTLY PMN     RARE SQUAMOUS EPITHELIAL CELLS PRESENT     FEW GRAM POSITIVE RODS     FEW GRAM POSITIVE COCCI IN PAIRS     Performed at [facility]   Culture     Final   Value: NORMAL OROPHARYNGEAL FLORA     Performed at [facility]   Report Status 07/26/2013 FINAL   Final     Studies: Dg Chest Port 1 View  07/28/2013   CLINICAL DATA:  Cough and shortness of breath  EXAM: PORTABLE CHEST - 1 VIEW  COMPARISON:  07/25/1948  FINDINGS: Cardiac shadow is stable. Significant increased in right-sided infiltrate particular in the right upper lobe is noted. An enlarging right-sided pleural effusion is seen. The left lung is well aerated but demonstrates some very mild patchy changes in the mid lung. No bony abnormality is noted.  IMPRESSION: Increasing bilateral infiltrates.   Electronically Signed   By: Inez Catalina M.D.   On: 07/28/2013 07:49    Scheduled Meds: . aspirin EC  81 mg Oral Daily  . buPROPion  150 mg Oral Daily  . celecoxib  200 mg Oral BID  . cholecalciferol  5,000 Units Oral Daily  . docusate sodium  100 mg Oral BID  . flecainide  100 mg Oral Q12H  . HYDROmorphone HCl  16 mg Oral BID  . levalbuterol  0.63 mg Nebulization Q6H  . NONFORMULARY OR COMPOUNDED ITEM 1 mL  1 mL Topical BID  . polyethylene glycol  17 g Oral Daily  . progesterone  100 mg Oral Daily  . pseudoephedrine  120 mg Oral Daily  . [START ON 07/30/2013] Testosterone Propionate  0.25 g Transdermal Once per day on Sun Sat  . Testosterone Propionate  0.5 g Transdermal Once per day on Tue Thu  . thyroid  90 mg Oral QAC breakfast  . vancomycin  1,500 mg Intravenous Once   Continuous Infusions:    Principal Problem:   Weakness Active Problems:   Atrial fibrillation   UTI (urinary tract infection)   HTN (hypertension)   Leukocytosis   Hyponatremia   Acute renal failure   CAP  (community acquired pneumonia)   COPD (chronic obstructive pulmonary disease)   Tobacco use   Pneumonia   Lung mass   Pleural effusion   Pleuritis    Time spent: >35 minuets     Kinnie Feil  [facility] Pager [phone]. If 7PM-7AM, please contact night-coverage at [email or web address], password [facility] 07/28/2013, 9:38 AM  LOS: 6 days

## 2013-07-28 NOTE — Plan of Care (Signed)
Problem: Phase II Progression Outcomes Goal: Other Phase II Outcomes/Goals Outcome: Progressing IV antibiotics Vancomycin and meperium started today.  Home medications- lasix and potassium started today.

## 2013-07-29 DIAGNOSIS — D72829 Elevated white blood cell count, unspecified: Secondary | ICD-10-CM

## 2013-07-29 LAB — BASIC METABOLIC PANEL
BUN: 7 mg/dL (ref 6–23)
CHLORIDE: 97 meq/L (ref 96–112)
CO2: 30 mEq/L (ref 19–32)
CREATININE: 0.76 mg/dL (ref 0.50–1.10)
Calcium: 8.5 mg/dL (ref 8.4–10.5)
GFR calc Af Amer: >90 mL/min (ref >90)
GFR calc non Af Amer: 90 mL/min — ABNORMAL LOW (ref >90)
GLUCOSE: 107 mg/dL — AB (ref 70–99)
Potassium: 3.8 mEq/L (ref 3.7–5.3)
Sodium: 138 mEq/L (ref 137–147)

## 2013-07-29 LAB — CBC
HEMATOCRIT: 31.5 % — AB (ref 36.0–46.0)
Hemoglobin: 10.5 g/dL — ABNORMAL LOW (ref 12.0–15.0)
MCH: 31.1 pg (ref 26.0–34.0)
MCHC: 33.3 g/dL (ref 30.0–36.0)
MCV: 93.2 fL (ref 78.0–100.0)
Platelets: 274 10*3/uL (ref 150–400)
RBC: 3.38 MIL/uL — ABNORMAL LOW (ref 3.87–5.11)
RDW: 14.2 % (ref 11.5–15.5)
WBC: 18.1 10*3/uL — AB (ref 4.0–10.5)

## 2013-07-29 LAB — GLUCOSE, CAPILLARY: GLUCOSE-CAPILLARY: 96 mg/dL (ref 70–99)

## 2013-07-29 MED ORDER — LEVALBUTEROL HCL 0.63 MG/3ML IN NEBU
0.6300 mg | INHALATION_SOLUTION | Freq: Three times a day (TID) | RESPIRATORY_TRACT | Status: DC
  Administered 2013-07-30 – 2013-08-01 (×7): 0.63 mg via RESPIRATORY_TRACT
  Filled 2013-07-29 (×10): qty 3

## 2013-07-29 NOTE — Progress Notes (Signed)
[facility] PROGRESS NOTE  Taylor [name] PPI:[id number] DOB: Sep 11, 1950 DOA: 07/22/2013 PCP: Raeanne Gathers, MD  Assessment/Plan: 63 y/o female with PMH of HTN, A fib not on AC, h/o tobacco use presented with weakness, hypotension found to have PNA  1. Generalized Weakness; Presumed related to UTI and PNA.. TSH/B12 ok.  -Seen by PT and no further needs identified.  2. UTI on atx; Cx with insignificant growth.  3. CAP/Acute Hypoxemic Respiratory Failure; CT Chest with extensive right-sided PNA and mediastinal/hilar adenopathy; but cannot rule out underlying mass. H/o smoking; added advair, inhalers  -repeat CXR: increased infiltrates; d/c PO atx, start IV cefepime/vanc 2/19; sputum c/s  Gr + in clusters -Plan to treat for 10 days and repeat CT Chest in 4-6 weeks.  4. ARF Resolved with IVF. D/c nsaids, use tylenol for pain    5. Atrial Fibrillation  -Rate controlled. Not on AC, resume BB 6. CHF fluid overlord;  Echo: LVEF 55%, mild pulmonary HTN; patient takes lasix prn at home  -improving on IV lasix, replace lytes; daily weight, I/O'; monitor renal function     Code Status: full Family Communication: d/w patient, updated husband at t he bedside (indicate person spoken with, relationship, and if by phone, the number) Disposition Plan: home 24-48 hours    Consultants:  Pulmonology   Procedures:  None   Antibiotics: Cipro 2/13 >>> 2/14  Levaquin 2/15 >>>2/19 Vanc2/19<<<< Cefepime 2/19<<<<    (indicate start date, and stop date if known)  HPI/Subjective: alert  Objective: Filed Vitals:   07/29/13 0535  BP: 133/53  Pulse: 81  Temp: 98.5 F (36.9 C)  Resp: 18    Intake/Output Summary (Last 24 hours) at 07/29/13 1036 Last data filed at 07/29/13 0534  Gross per 24 hour  Intake    740 ml  Output      1 ml  Net    739 ml   Filed Weights   07/26/13 0600 07/27/13 0502 07/29/13 0535  Weight: 108.183 kg (238 lb 8 oz) 108.364 kg (238 lb 14.4 oz) 107.2 kg (236  lb 5.3 oz)    Exam:   General:  Alert   Cardiovascular: s1,s2 rrr  Respiratory: R sided rales   Abdomen: soft, nt, nd  Musculoskeletal: LE edema   Data Reviewed: Basic Metabolic Panel:  Recent Labs Lab 07/22/13 1141 07/22/13 1659 07/23/13 0445 07/24/13 0545 07/25/13 1034 07/27/13 0625 07/29/13 0400  NA 130* 126* 131* 138 138 136* 138  K 4.8 4.6 3.9 4.0 3.7 3.9 3.8  CL 91* 87* 93* 100 101 102 97  CO2 25 25 22 26 25 25 30   GLUCOSE 100* 85 76 125* 107* 101* 107*  BUN 21 19 12 8  5* 5* 7  CREATININE 1.30* 1.17* 1.06 0.91 0.87 0.77 0.76  CALCIUM 9.0 9.0 8.5 8.5 8.8 8.4 8.5  MG  --  2.0  --   --   --   --   --   PHOS  --  2.5  --   --   --   --   --    Liver Function Tests:  Recent Labs Lab 07/22/13 1659 07/23/13 0445  AST 27 21  ALT 29 23  ALKPHOS 70 89  BILITOT 0.6 0.5  PROT 6.9 6.2  ALBUMIN 3.2* 2.7*   No results found for this basename: LIPASE, AMYLASE,  in the last 168 hours No results found for this basename: AMMONIA,  in the last 168 hours CBC:  Recent Labs Lab 07/22/13 1141  07/22/13 1659  07/24/13 0545 07/25/13 1034 07/26/13 0416 07/27/13 0625 07/29/13 0400  WBC 20.6* 17.3*  < > 16.6* 14.9* 15.1* 16.2* 18.1*  NEUTROABS 17.8* 15.2*  --   --   --   --   --   --   HGB 13.4 13.1  < > 12.9 12.5 11.6* 11.0* 10.5*  HCT 38.9 39.3  < > 38.6 37.9 35.4* 33.1* 31.5*  MCV 93.5 94.5  < > 95.8 95.7 96.2 95.9 93.2  PLT 194 183  < > 194 239 228 224 274  < > = values in this interval not displayed. Cardiac Enzymes:  Recent Labs Lab 07/22/13 1141  TROPONINI <0.30   BNP (last 3 results)  Recent Labs  07/24/13 1440  PROBNP 1142.0*   CBG:  Recent Labs Lab 07/25/13 0600 07/26/13 0625 07/27/13 0646 07/28/13 0634 07/29/13 0643  GLUCAP 101* 93 85 124* 96    Recent Results (from the past 240 hour(s))  URINE CULTURE     Status: None   Collection Time    07/22/13 12:27 PM      Result Value Ref Range Status   Specimen Description URINE, CLEAN  CATCH   Final   Special Requests NONE   Final   Culture  Setup Time     Final   Value: 07/22/2013 17:12     Performed at [place]     Final   Value: >=100,000 COLONIES/ML     Performed at [facility]   Culture     Final   Value: Multiple bacterial morphotypes present, none predominant. Suggest appropriate recollection if clinically indicated.     Performed at [facility]   Report Status 07/23/2013 FINAL   Final  URINE CULTURE     Status: None   Collection Time    07/22/13  9:35 PM      Result Value Ref Range Status   Specimen Description URINE, CLEAN CATCH   Final   Special Requests NONE   Final   Culture  Setup Time     Final   Value: 07/22/2013 21:59     Performed at [place]     Final   Value: 8,000 COLONIES/ML     Performed at [facility]   Culture     Final   Value: INSIGNIFICANT GROWTH     Performed at [facility]   Report Status 07/23/2013 FINAL   Final  CULTURE, BLOOD (ROUTINE X 2)     Status: None   Collection Time    07/24/13 11:45 AM      Result Value Ref Range Status   Specimen Description BLOOD RIGHT ANTECUBITAL   Final   Special Requests BOTTLES DRAWN AEROBIC ONLY 10CC   Final   Culture  Setup Time     Final   Value: 07/24/2013 18:30     Performed at [facility]   Culture     Final   Value:        BLOOD CULTURE RECEIVED NO GROWTH TO DATE CULTURE WILL BE HELD FOR 5 DAYS BEFORE ISSUING A FINAL NEGATIVE REPORT     Performed at [facility]   Report Status PENDING   Incomplete  CULTURE, BLOOD (ROUTINE X 2)     Status: None   Collection Time    07/24/13 11:50 AM      Result Value Ref Range Status   Specimen Description BLOOD  RIGHT HAND   Final   Special Requests BOTTLES DRAWN AEROBIC ONLY 5CC   Final   Culture  Setup Time     Final   Value: 07/24/2013 18:30     Performed at [facility]   Culture     Final   Value:        BLOOD CULTURE RECEIVED  NO GROWTH TO DATE CULTURE WILL BE HELD FOR 5 DAYS BEFORE ISSUING A FINAL NEGATIVE REPORT     Performed at [facility]   Report Status PENDING   Incomplete  RESPIRATORY VIRUS PANEL     Status: None   Collection Time    07/24/13  3:10 PM      Result Value Ref Range Status   Source - RVPAN NASAL SWAB   Corrected   Comment: CORRECTED ON 02/16 AT 1809: PREVIOUSLY REPORTED AS NASAL SWAB   Respiratory Syncytial Virus A NOT DETECTED   Final   Respiratory Syncytial Virus B NOT DETECTED   Final   Influenza A NOT DETECTED   Final   Influenza B NOT DETECTED   Final   Parainfluenza 1 NOT DETECTED   Final   Parainfluenza 2 NOT DETECTED   Final   Parainfluenza 3 NOT DETECTED   Final   Metapneumovirus NOT DETECTED   Final   Rhinovirus NOT DETECTED   Final   Adenovirus NOT DETECTED   Final   Influenza A H1 NOT DETECTED   Final   Influenza A H3 NOT DETECTED   Final   Comment: (NOTE)           Normal Reference Range for each Analyte: NOT DETECTED     Testing performed using the Luminex xTAG Respiratory Viral Panel test     kit.     This test was developed and its performance characteristics determined     by [facility]. It has not been cleared or approved by the [place]     Food and Drug Administration. This test is used for clinical purposes.     It should not be regarded as investigational or for research. This     laboratory is certified under the [place] (CLIA) as qualified to perform high complexity     clinical laboratory testing.     Performed at [place], EXPECTORATED SPUTUM-ASSESSMENT     Status: None   Collection Time    07/24/13  4:13 PM      Result Value Ref Range Status   Specimen Description SPUTUM   Final   Special Requests NONE   Final   Sputum evaluation     Final   Value: THIS SPECIMEN IS ACCEPTABLE. RESPIRATORY CULTURE REPORT TO FOLLOW.   Report Status 07/24/2013 FINAL   Final  CULTURE,  RESPIRATORY (NON-EXPECTORATED)     Status: None   Collection Time    07/24/13  4:13 PM      Result Value Ref Range Status   Specimen Description SPUTUM   Final   Special Requests NONE   Final   Gram Stain     Final   Value: FEW WBC PRESENT, PREDOMINANTLY PMN     RARE SQUAMOUS EPITHELIAL CELLS PRESENT     FEW GRAM POSITIVE RODS     FEW GRAM POSITIVE COCCI IN PAIRS     Performed at [facility]   Culture     Final   Value: NORMAL OROPHARYNGEAL FLORA  Performed at [facility]   Report Status 07/26/2013 FINAL   Final  CULTURE, EXPECTORATED SPUTUM-ASSESSMENT     Status: None   Collection Time    07/28/13  2:41 PM      Result Value Ref Range Status   Specimen Description SPUTUM   Final   Special Requests NONE   Final   Sputum evaluation     Final   Value: THIS SPECIMEN IS ACCEPTABLE. RESPIRATORY CULTURE REPORT TO FOLLOW.   Report Status 07/28/2013 FINAL   Final  CULTURE, RESPIRATORY (NON-EXPECTORATED)     Status: None   Collection Time    07/28/13  2:41 PM      Result Value Ref Range Status   Specimen Description SPUTUM   Final   Special Requests NONE   Final   Gram Stain     Final   Value: RARE WBC PRESENT,BOTH PMN AND MONONUCLEAR     RARE SQUAMOUS EPITHELIAL CELLS PRESENT     RARE GRAM POSITIVE COCCI IN CLUSTERS     Performed at [facility]   Culture PENDING   Incomplete   Report Status PENDING   Incomplete     Studies: Dg Chest Port 1 View  07/28/2013   CLINICAL DATA:  Cough and shortness of breath  EXAM: PORTABLE CHEST - 1 VIEW  COMPARISON:  07/25/1948  FINDINGS: Cardiac shadow is stable. Significant increased in right-sided infiltrate particular in the right upper lobe is noted. An enlarging right-sided pleural effusion is seen. The left lung is well aerated but demonstrates some very mild patchy changes in the mid lung. No bony abnormality is noted.  IMPRESSION: Increasing bilateral infiltrates.   Electronically Signed   By: Inez Catalina M.D.    On: 07/28/2013 07:49    Scheduled Meds: . aspirin EC  81 mg Oral Daily  . buPROPion  150 mg Oral Daily  . ceFEPime (MAXIPIME) IV  2 g Intravenous Q12H  . cholecalciferol  5,000 Units Oral Daily  . docusate sodium  100 mg Oral BID  . flecainide  100 mg Oral Q12H  . furosemide  20 mg Intravenous BID  . HYDROmorphone HCl  16 mg Oral BID  . levalbuterol  0.63 mg Nebulization Q6H  . metoprolol tartrate  12.5 mg Oral BID  . mometasone-formoterol  2 puff Inhalation BID  . NONFORMULARY OR COMPOUNDED ITEM 1 mL  1 mL Topical BID  . polyethylene glycol  17 g Oral Daily  . potassium chloride  20 mEq Oral BID  . progesterone  100 mg Oral Daily  . pseudoephedrine  120 mg Oral Daily  . [START ON 07/30/2013] Testosterone Propionate  0.25 g Transdermal Once per day on Sun Sat  . Testosterone Propionate  0.5 g Transdermal Once per day on Tue Thu  . thyroid  90 mg Oral QAC breakfast  . vancomycin  1,000 mg Intravenous Q12H   Continuous Infusions:    Principal Problem:   Weakness Active Problems:   Atrial fibrillation   UTI (urinary tract infection)   HTN (hypertension)   Leukocytosis   Hyponatremia   Acute renal failure   CAP (community acquired pneumonia)   COPD (chronic obstructive pulmonary disease)   Tobacco use   Pneumonia   Lung mass   Pleural effusion   Pleuritis    Time spent: >35 minuets     Kinnie Feil  [facility] Pager [phone]. If 7PM-7AM, please contact night-coverage at [email or web address], password [facility] 07/29/2013, 10:36 AM  LOS: 7 days

## 2013-07-30 LAB — CULTURE, BLOOD (ROUTINE X 2)
CULTURE: NO GROWTH
Culture: NO GROWTH

## 2013-07-30 LAB — GLUCOSE, CAPILLARY: Glucose-Capillary: 85 mg/dL (ref 70–99)

## 2013-07-30 LAB — VANCOMYCIN, TROUGH: Vancomycin Tr: 7 ug/mL — ABNORMAL LOW (ref 10.0–20.0)

## 2013-07-30 MED ORDER — VANCOMYCIN HCL IN DEXTROSE 1-5 GM/200ML-% IV SOLN
1000.0000 mg | Freq: Three times a day (TID) | INTRAVENOUS | Status: DC
  Administered 2013-07-30 – 2013-08-01 (×5): 1000 mg via INTRAVENOUS
  Filled 2013-07-30 (×8): qty 200

## 2013-07-30 NOTE — Progress Notes (Signed)
ANTIBIOTIC CONSULT NOTE - FOLLOW UP  Pharmacy Consult for vanc/cefepime Indication: pneumonia  Allergies  Allergen Reactions  . Penicillins Itching and Rash    Patient Measurements: Height: _0  (167.6 cm) Weight: 232 lb 3.2 oz (105.325 kg) IBW/kg (Calculated) : 59.3 Adjusted Body Weight:   Vital Signs: Temp: 98.2 F (36.8 C) (02/21 3419) Temp src: Oral (02/21 6222) BP: 138/79 mmHg (02/21 9798) Pulse Rate: 72 (02/21 0633) Intake/Output from previous day: 02/20 0701 - 02/21 0700 In: 1330 [P.O.:1080; IV Piggyback:250] Out: -  Intake/Output from this shift: Total I/O In: 490 [P.O.:240; IV Piggyback:250] Out: -   Labs:  Recent Labs  07/29/13 0400  WBC 18.1*  HGB 10.5*  PLT 274  CREATININE 0.76   Estimated Creatinine Clearance: 91.7 ml/min (by C-G formula based on Cr of 0.76).  Recent Labs  07/30/13 0955  VANCOTROUGH 7.0*     Microbiology: Recent Results (from the past 720 hour(s))  URINE CULTURE     Status: None   Collection Time    07/22/13 12:27 PM      Result Value Ref Range Status   Specimen Description URINE, CLEAN CATCH   Final   Special Requests NONE   Final   Culture  Setup Time     Final   Value: 07/22/2013 17:12     Performed at [place]     Final   Value: >=100,000 COLONIES/ML     Performed at [facility]   Culture     Final   Value: Multiple bacterial morphotypes present, none predominant. Suggest appropriate recollection if clinically indicated.     Performed at [facility]   Report Status 07/23/2013 FINAL   Final  URINE CULTURE     Status: None   Collection Time    07/22/13  9:35 PM      Result Value Ref Range Status   Specimen Description URINE, CLEAN CATCH   Final   Special Requests NONE   Final   Culture  Setup Time     Final   Value: 07/22/2013 21:59     Performed at [place]     Final   Value: 8,000 COLONIES/ML     Performed at [facility]   Culture     Final   Value: INSIGNIFICANT GROWTH     Performed at [facility]   Report Status 07/23/2013 FINAL   Final  CULTURE, BLOOD (ROUTINE X 2)     Status: None   Collection Time    07/24/13 11:45 AM      Result Value Ref Range Status   Specimen Description BLOOD RIGHT ANTECUBITAL   Final   Special Requests BOTTLES DRAWN AEROBIC ONLY 10CC   Final   Culture  Setup Time     Final   Value: 07/24/2013 18:30     Performed at [facility]   Culture     Final   Value:        BLOOD CULTURE RECEIVED NO GROWTH TO DATE CULTURE WILL BE HELD FOR 5 DAYS BEFORE ISSUING A FINAL NEGATIVE REPORT     Performed at [facility]   Report Status PENDING   Incomplete  CULTURE, BLOOD (ROUTINE X 2)     Status: None   Collection Time    07/24/13 11:50 AM      Result Value Ref Range Status   Specimen Description BLOOD RIGHT HAND   Final  Special Requests BOTTLES DRAWN AEROBIC ONLY 5CC   Final   Culture  Setup Time     Final   Value: 07/24/2013 18:30     Performed at [facility]   Culture     Final   Value:        BLOOD CULTURE RECEIVED NO GROWTH TO DATE CULTURE WILL BE HELD FOR 5 DAYS BEFORE ISSUING A FINAL NEGATIVE REPORT     Performed at [facility]   Report Status PENDING   Incomplete  RESPIRATORY VIRUS PANEL     Status: None   Collection Time    07/24/13  3:10 PM      Result Value Ref Range Status   Source - RVPAN NASAL SWAB   Corrected   Comment: CORRECTED ON 02/16 AT 1809: PREVIOUSLY REPORTED AS NASAL SWAB   Respiratory Syncytial Virus A NOT DETECTED   Final   Respiratory Syncytial Virus B NOT DETECTED   Final   Influenza A NOT DETECTED   Final   Influenza B NOT DETECTED   Final   Parainfluenza 1 NOT DETECTED   Final   Parainfluenza 2 NOT DETECTED   Final   Parainfluenza 3 NOT DETECTED   Final   Metapneumovirus NOT DETECTED   Final   Rhinovirus NOT DETECTED   Final   Adenovirus NOT DETECTED   Final   Influenza A H1 NOT DETECTED   Final    Influenza A H3 NOT DETECTED   Final   Comment: (NOTE)           Normal Reference Range for each Analyte: NOT DETECTED     Testing performed using the Luminex xTAG Respiratory Viral Panel test     kit.     This test was developed and its performance characteristics determined     by [facility]. It has not been cleared or approved by the [place]     Food and Drug Administration. This test is used for clinical purposes.     It should not be regarded as investigational or for research. This     laboratory is certified under the [place] (CLIA) as qualified to perform high complexity     clinical laboratory testing.     Performed at [place], EXPECTORATED SPUTUM-ASSESSMENT     Status: None   Collection Time    07/24/13  4:13 PM      Result Value Ref Range Status   Specimen Description SPUTUM   Final   Special Requests NONE   Final   Sputum evaluation     Final   Value: THIS SPECIMEN IS ACCEPTABLE. RESPIRATORY CULTURE REPORT TO FOLLOW.   Report Status 07/24/2013 FINAL   Final  CULTURE, RESPIRATORY (NON-EXPECTORATED)     Status: None   Collection Time    07/24/13  4:13 PM      Result Value Ref Range Status   Specimen Description SPUTUM   Final   Special Requests NONE   Final   Gram Stain     Final   Value: FEW WBC PRESENT, PREDOMINANTLY PMN     RARE SQUAMOUS EPITHELIAL CELLS PRESENT     FEW GRAM POSITIVE RODS     FEW GRAM POSITIVE COCCI IN PAIRS     Performed at [facility]   Culture     Final   Value: NORMAL OROPHARYNGEAL FLORA     Performed at [facility]  Report Status 07/26/2013 FINAL   Final  CULTURE, EXPECTORATED SPUTUM-ASSESSMENT     Status: None   Collection Time    07/28/13  2:41 PM      Result Value Ref Range Status   Specimen Description SPUTUM   Final   Special Requests NONE   Final   Sputum evaluation     Final   Value: THIS SPECIMEN IS ACCEPTABLE. RESPIRATORY CULTURE REPORT  TO FOLLOW.   Report Status 07/28/2013 FINAL   Final  CULTURE, RESPIRATORY (NON-EXPECTORATED)     Status: None   Collection Time    07/28/13  2:41 PM      Result Value Ref Range Status   Specimen Description SPUTUM   Final   Special Requests NONE   Final   Gram Stain     Final   Value: RARE WBC PRESENT,BOTH PMN AND MONONUCLEAR     RARE SQUAMOUS EPITHELIAL CELLS PRESENT     RARE GRAM POSITIVE COCCI IN CLUSTERS     Performed at [facility]   Culture PENDING   Incomplete   Report Status PENDING   Incomplete    Anti-infectives   Start     Dose/Rate Route Frequency Ordered Stop   07/28/13 2300  vancomycin (VANCOCIN) IVPB 1000 mg/200 mL premix     1,000 mg 200 mL/hr over 60 Minutes Intravenous Every 12 hours 07/28/13 1007     07/28/13 1000  vancomycin (VANCOCIN) 1,500 mg in sodium chloride 0.9 % 500 mL IVPB     1,500 mg 250 mL/hr over 120 Minutes Intravenous  Once 07/28/13 0936 07/28/13 1306   07/28/13 1000  ceFEPIme (MAXIPIME) 2 g in dextrose 5 % 50 mL IVPB     2 g 100 mL/hr over 30 Minutes Intravenous Every 12 hours 07/28/13 0944     07/26/13 1330  levofloxacin (LEVAQUIN) tablet 750 mg  Status:  Discontinued     750 mg Oral Daily 07/26/13 1215 07/28/13 0919   07/24/13 1500  levofloxacin (LEVAQUIN) IVPB 750 mg  Status:  Discontinued     750 mg 100 mL/hr over 90 Minutes Intravenous Every 24 hours 07/24/13 1415 07/26/13 1215   07/24/13 1000  levofloxacin (LEVAQUIN) tablet 750 mg  Status:  Discontinued     750 mg Oral Daily 07/24/13 0942 07/24/13 1343   07/22/13 1700  ciprofloxacin (CIPRO) IVPB 400 mg  Status:  Discontinued     400 mg 200 mL/hr over 60 Minutes Intravenous Every 24 hours 07/22/13 1532 07/24/13 0942   07/22/13 1515  ciprofloxacin (CIPRO) IVPB 400 mg  Status:  Discontinued     400 mg 200 mL/hr over 60 Minutes Intravenous Every 24 hours 07/22/13 1514 07/22/13 1532      Assessment: 63 yo who has been on levaquin for PNA. CXR worsened per MD so abx expanded to  vanc/cefepime. All cultures have been neg for now. Vanc trough came back low. Will adjust dose.   Goal of Therapy:  Vancomycin trough level 15-20 mcg/ml  Plan:   Increase vanc to 1g IV q8 Rechech level if needed

## 2013-07-30 NOTE — Progress Notes (Addendum)
[facility] PROGRESS NOTE  Taylor [name] [id number] DOB: November 08, 1950 DOA: 07/22/2013 PCP: Raeanne Gathers, MD  Assessment/Plan: 63 y/o female with PMH of HTN, A fib not on AC, h/o tobacco use presented with weakness, hypotension found to have PNA  1. Generalized Weakness; Presumed related to UTI and PNA.. TSH/B12 ok.  -Seen by PT and no further needs identified.  2. UTI on atx; Cx with insignificant growth.  3. CAP/Acute Hypoxemic Respiratory Failure; CT Chest with extensive right-sided PNA and mediastinal/hilar adenopathy; but cannot rule out underlying mass. H/o smoking; added advair, inhalers  -repeat CXR: increased infiltrates; d/c PO atx, start IV cefepime/vanc 2/19; sputum c/s  Gr + in clusters -improving but still dyspneic; Plan to treat for 10 days and repeat CT Chest in 4-6 weeks.  4. ARF Resolved with IVF. D/c nsaids, use tylenol for pain    5. Atrial Fibrillation  -Rate controlled. Not on AC, resume BB 6. CHF fluid overlord;  Echo: LVEF 55%, mild pulmonary HTN; patient takes lasix prn at home  -improving edema on IV lasix, replace lytes; daily weight, I/O'; monitor renal function     Code Status: full Family Communication: d/w patient, updated husband at t he bedside (indicate person spoken with, relationship, and if by phone, the number) Disposition Plan: home 24-48 hours    Consultants:  Pulmonology   Procedures:  None   Antibiotics: Cipro 2/13 >>> 2/14  Levaquin 2/15 >>>2/19 Vanc2/19<<<< Cefepime 2/19<<<<    (indicate start date, and stop date if known)  HPI/Subjective: alert  Objective: Filed Vitals:   07/30/13 0633  BP: 138/79  Pulse: 72  Temp: 98.2 F (36.8 C)  Resp: 18    Intake/Output Summary (Last 24 hours) at 07/30/13 0851 Last data filed at 07/30/13 0707  Gross per 24 hour  Intake   1100 ml  Output      0 ml  Net   1100 ml   Filed Weights   07/27/13 0502 07/29/13 0535 07/30/13 0500  Weight: 108.364 kg (238 lb 14.4 oz)  107.2 kg (236 lb 5.3 oz) 105.325 kg (232 lb 3.2 oz)    Exam:   General:  Alert   Cardiovascular: s1,s2 rrr  Respiratory: R sided rales   Abdomen: soft, nt, nd  Musculoskeletal: LE edema   Data Reviewed: Basic Metabolic Panel:  Recent Labs Lab 07/24/13 0545 07/25/13 1034 07/27/13 0625 07/29/13 0400  NA 138 138 136* 138  K 4.0 3.7 3.9 3.8  CL 100 101 102 97  CO2 _0 GLUCOSE 125* 107* 101* 107*  BUN 8 5* 5* 7  CREATININE 0.91 0.87 0.77 0.76  CALCIUM 8.5 8.8 8.4 8.5   Liver Function Tests: No results found for this basename: AST, ALT, ALKPHOS, BILITOT, PROT, ALBUMIN,  in the last 168 hours No results found for this basename: LIPASE, AMYLASE,  in the last 168 hours No results found for this basename: AMMONIA,  in the last 168 hours CBC:  Recent Labs Lab 07/24/13 0545 07/25/13 1034 07/26/13 0416 07/27/13 0625 07/29/13 0400  WBC 16.6* 14.9* 15.1* 16.2* 18.1*  HGB 12.9 12.5 11.6* 11.0* 10.5*  HCT 38.6 37.9 35.4* 33.1* 31.5*  MCV 95.8 95.7 96.2 95.9 93.2  PLT 194 239 228 224 274   Cardiac Enzymes: No results found for this basename: CKTOTAL, CKMB, CKMBINDEX, TROPONINI,  in the last 168 hours BNP (last 3 results)  Recent Labs  07/24/13 1440  PROBNP 1142.0*   CBG:  Recent Labs Lab 07/26/13 [phone]  07/27/13 0646 07/28/13 0634 07/29/13 0643 07/30/13 0629  GLUCAP 93 85 124* 96 85    Recent Results (from the past 240 hour(s))  URINE CULTURE     Status: None   Collection Time    07/22/13 12:27 PM      Result Value Ref Range Status   Specimen Description URINE, CLEAN CATCH   Final   Special Requests NONE   Final   Culture  Setup Time     Final   Value: 07/22/2013 17:12     Performed at [place]     Final   Value: >=100,000 COLONIES/ML     Performed at [facility]   Culture     Final   Value: Multiple bacterial morphotypes present, none predominant. Suggest appropriate recollection if clinically indicated.      Performed at [facility]   Report Status 07/23/2013 FINAL   Final  URINE CULTURE     Status: None   Collection Time    07/22/13  9:35 PM      Result Value Ref Range Status   Specimen Description URINE, CLEAN CATCH   Final   Special Requests NONE   Final   Culture  Setup Time     Final   Value: 07/22/2013 21:59     Performed at [place]     Final   Value: 8,000 COLONIES/ML     Performed at [facility]   Culture     Final   Value: INSIGNIFICANT GROWTH     Performed at [facility]   Report Status 07/23/2013 FINAL   Final  CULTURE, BLOOD (ROUTINE X 2)     Status: None   Collection Time    07/24/13 11:45 AM      Result Value Ref Range Status   Specimen Description BLOOD RIGHT ANTECUBITAL   Final   Special Requests BOTTLES DRAWN AEROBIC ONLY 10CC   Final   Culture  Setup Time     Final   Value: 07/24/2013 18:30     Performed at [facility]   Culture     Final   Value:        BLOOD CULTURE RECEIVED NO GROWTH TO DATE CULTURE WILL BE HELD FOR 5 DAYS BEFORE ISSUING A FINAL NEGATIVE REPORT     Performed at [facility]   Report Status PENDING   Incomplete  CULTURE, BLOOD (ROUTINE X 2)     Status: None   Collection Time    07/24/13 11:50 AM      Result Value Ref Range Status   Specimen Description BLOOD RIGHT HAND   Final   Special Requests BOTTLES DRAWN AEROBIC ONLY 5CC   Final   Culture  Setup Time     Final   Value: 07/24/2013 18:30     Performed at [facility]   Culture     Final   Value:        BLOOD CULTURE RECEIVED NO GROWTH TO DATE CULTURE WILL BE HELD FOR 5 DAYS BEFORE ISSUING A FINAL NEGATIVE REPORT     Performed at [facility]   Report Status PENDING   Incomplete  RESPIRATORY VIRUS PANEL     Status: None   Collection Time    07/24/13  3:10 PM      Result Value Ref Range Status   Source - RVPAN NASAL SWAB   Corrected  Comment: CORRECTED ON 02/16 AT 1809: PREVIOUSLY REPORTED AS  NASAL SWAB   Respiratory Syncytial Virus A NOT DETECTED   Final   Respiratory Syncytial Virus B NOT DETECTED   Final   Influenza A NOT DETECTED   Final   Influenza B NOT DETECTED   Final   Parainfluenza 1 NOT DETECTED   Final   Parainfluenza 2 NOT DETECTED   Final   Parainfluenza 3 NOT DETECTED   Final   Metapneumovirus NOT DETECTED   Final   Rhinovirus NOT DETECTED   Final   Adenovirus NOT DETECTED   Final   Influenza A H1 NOT DETECTED   Final   Influenza A H3 NOT DETECTED   Final   Comment: (NOTE)           Normal Reference Range for each Analyte: NOT DETECTED     Testing performed using the Luminex xTAG Respiratory Viral Panel test     kit.     This test was developed and its performance characteristics determined     by [facility]. It has not been cleared or approved by the [place]     Food and Drug Administration. This test is used for clinical purposes.     It should not be regarded as investigational or for research. This     laboratory is certified under the [place] (CLIA) as qualified to perform high complexity     clinical laboratory testing.     Performed at [place], EXPECTORATED SPUTUM-ASSESSMENT     Status: None   Collection Time    07/24/13  4:13 PM      Result Value Ref Range Status   Specimen Description SPUTUM   Final   Special Requests NONE   Final   Sputum evaluation     Final   Value: THIS SPECIMEN IS ACCEPTABLE. RESPIRATORY CULTURE REPORT TO FOLLOW.   Report Status 07/24/2013 FINAL   Final  CULTURE, RESPIRATORY (NON-EXPECTORATED)     Status: None   Collection Time    07/24/13  4:13 PM      Result Value Ref Range Status   Specimen Description SPUTUM   Final   Special Requests NONE   Final   Gram Stain     Final   Value: FEW WBC PRESENT, PREDOMINANTLY PMN     RARE SQUAMOUS EPITHELIAL CELLS PRESENT     FEW GRAM POSITIVE RODS     FEW GRAM POSITIVE COCCI IN PAIRS     Performed at  [facility]   Culture     Final   Value: NORMAL OROPHARYNGEAL FLORA     Performed at [facility]   Report Status 07/26/2013 FINAL   Final  CULTURE, EXPECTORATED SPUTUM-ASSESSMENT     Status: None   Collection Time    07/28/13  2:41 PM      Result Value Ref Range Status   Specimen Description SPUTUM   Final   Special Requests NONE   Final   Sputum evaluation     Final   Value: THIS SPECIMEN IS ACCEPTABLE. RESPIRATORY CULTURE REPORT TO FOLLOW.   Report Status 07/28/2013 FINAL   Final  CULTURE, RESPIRATORY (NON-EXPECTORATED)     Status: None   Collection Time    07/28/13  2:41 PM      Result Value Ref Range Status   Specimen Description SPUTUM   Final   Special Requests NONE  Final   Gram Stain     Final   Value: RARE WBC PRESENT,BOTH PMN AND MONONUCLEAR     RARE SQUAMOUS EPITHELIAL CELLS PRESENT     RARE GRAM POSITIVE COCCI IN CLUSTERS     Performed at [facility]   Culture PENDING   Incomplete   Report Status PENDING   Incomplete     Studies: No results found.  Scheduled Meds: . aspirin EC  81 mg Oral Daily  . buPROPion  150 mg Oral Daily  . ceFEPime (MAXIPIME) IV  2 g Intravenous Q12H  . cholecalciferol  5,000 Units Oral Daily  . docusate sodium  100 mg Oral BID  . flecainide  100 mg Oral Q12H  . furosemide  20 mg Intravenous BID  . HYDROmorphone HCl  16 mg Oral BID  . levalbuterol  0.63 mg Nebulization TID  . metoprolol tartrate  12.5 mg Oral BID  . mometasone-formoterol  2 puff Inhalation BID  . NONFORMULARY OR COMPOUNDED ITEM 1 mL  1 mL Topical BID  . polyethylene glycol  17 g Oral Daily  . potassium chloride  20 mEq Oral BID  . progesterone  100 mg Oral Daily  . pseudoephedrine  120 mg Oral Daily  . Testosterone Propionate  0.25 g Transdermal Once per day on Sun Sat  . Testosterone Propionate  0.5 g Transdermal Once per day on Tue Thu  . thyroid  90 mg Oral QAC breakfast  . vancomycin  1,000 mg Intravenous Q12H   Continuous  Infusions:    Principal Problem:   Weakness Active Problems:   Atrial fibrillation   UTI (urinary tract infection)   HTN (hypertension)   Leukocytosis   Hyponatremia   Acute renal failure   CAP (community acquired pneumonia)   COPD (chronic obstructive pulmonary disease)   Tobacco use   Pneumonia   Lung mass   Pleural effusion   Pleuritis    Time spent: >35 minuets     Kinnie Feil  [facility] Pager [phone]. If 7PM-7AM, please contact night-coverage at [email or web address], password [facility] 07/30/2013, 8:51 AM  LOS: 8 days

## 2013-07-31 LAB — BASIC METABOLIC PANEL
BUN: 9 mg/dL (ref 6–23)
CHLORIDE: 92 meq/L — AB (ref 96–112)
CO2: 31 meq/L (ref 19–32)
CREATININE: 0.69 mg/dL (ref 0.50–1.10)
Calcium: 8.8 mg/dL (ref 8.4–10.5)
GFR calc non Af Amer: >90 mL/min (ref >90)
Glucose, Bld: 111 mg/dL — ABNORMAL HIGH (ref 70–99)
POTASSIUM: 3.8 meq/L (ref 3.7–5.3)
Sodium: 134 mEq/L — ABNORMAL LOW (ref 137–147)

## 2013-07-31 LAB — CBC
HEMATOCRIT: 35 % — AB (ref 36.0–46.0)
Hemoglobin: 11.6 g/dL — ABNORMAL LOW (ref 12.0–15.0)
MCH: 30.7 pg (ref 26.0–34.0)
MCHC: 33.1 g/dL (ref 30.0–36.0)
MCV: 92.6 fL (ref 78.0–100.0)
Platelets: 359 10*3/uL (ref 150–400)
RBC: 3.78 MIL/uL — AB (ref 3.87–5.11)
RDW: 13.9 % (ref 11.5–15.5)
WBC: 18.9 10*3/uL — AB (ref 4.0–10.5)

## 2013-07-31 LAB — GLUCOSE, CAPILLARY
Glucose-Capillary: 101 mg/dL — ABNORMAL HIGH (ref 70–99)
Glucose-Capillary: 114 mg/dL — ABNORMAL HIGH (ref 70–99)

## 2013-07-31 NOTE — Progress Notes (Signed)
[facility] PROGRESS NOTE  Taylor [name] [id number] DOB: 1950/06/16 DOA: 07/22/2013 PCP: Raeanne Gathers, MD  Assessment/Plan: 63 y/o female with PMH of HTN, A fib not on AC, h/o tobacco use presented with weakness, hypotension found to have PNA  1. Generalized Weakness; Presumed related to UTI and PNA.. TSH/B12 ok.  -Seen by PT and no further needs identified.  2. UTI on atx; Cx with insignificant growth.  3. CAP/Acute Hypoxemic Respiratory Failure; CT Chest with extensive right-sided PNA and mediastinal/hilar adenopathy; but cannot rule out underlying mass. H/o smoking; added advair, inhalers  -repeat CXR: increased infiltrates; d/c PO atx, start IV cefepime/vanc 2/19; sputum c/s  Gr + in clusters -slowly improving;  pend sputum final c/s; repeat CT Chest in 4-6 weeks.  4. ARF Resolved with IVF. D/c nsaids, use tylenol for pain    5. Atrial Fibrillation  -Rate controlled. Not on AC, resume BB 6. CHF fluid overlord;  Echo: LVEF 55%, mild pulmonary HTN; patient takes lasix prn at home  -improving edema on IV lasix, replace lytes; daily weight, I/O'; monitor renal function     Code Status: full Family Communication: d/w patient, updated husband at t he bedside (indicate person spoken with, relationship, and if by phone, the number) Disposition Plan: home 2/23 if stable   Consultants:  Pulmonology   Procedures:  None   Antibiotics: Cipro 2/13 >>> 2/14  Levaquin 2/15 >>>2/19 Vanc2/19<<<< Cefepime 2/19<<<<    (indicate start date, and stop date if known)  HPI/Subjective: alert  Objective: Filed Vitals:   07/31/13 0455  BP:   Pulse: 73  Temp: 98.2 F (36.8 C)  Resp: 18    Intake/Output Summary (Last 24 hours) at 07/31/13 0839 Last data filed at 07/31/13 0458  Gross per 24 hour  Intake    720 ml  Output      0 ml  Net    720 ml   Filed Weights   07/29/13 0535 07/30/13 0500 07/31/13 0455  Weight: 107.2 kg (236 lb 5.3 oz) 105.325 kg (232 lb 3.2 oz)  105.4 kg (232 lb 5.8 oz)    Exam:   General:  Alert   Cardiovascular: s1,s2 rrr  Respiratory: R sided rales   Abdomen: soft, nt, nd  Musculoskeletal: LE edema   Data Reviewed: Basic Metabolic Panel:  Recent Labs Lab 07/25/13 1034 07/27/13 0625 07/29/13 0400 07/31/13 0530  NA 138 136* 138 134*  K 3.7 3.9 3.8 3.8  CL 101 102 97 92*  CO2 25 25 30 31   GLUCOSE 107* 101* 107* 111*  BUN 5* 5* 7 9  CREATININE 0.87 0.77 0.76 0.69  CALCIUM 8.8 8.4 8.5 8.8   Liver Function Tests: No results found for this basename: AST, ALT, ALKPHOS, BILITOT, PROT, ALBUMIN,  in the last 168 hours No results found for this basename: LIPASE, AMYLASE,  in the last 168 hours No results found for this basename: AMMONIA,  in the last 168 hours CBC:  Recent Labs Lab 07/25/13 1034 07/26/13 0416 07/27/13 0625 07/29/13 0400 07/31/13 0530  WBC 14.9* 15.1* 16.2* 18.1* 18.9*  HGB 12.5 11.6* 11.0* 10.5* 11.6*  HCT 37.9 35.4* 33.1* 31.5* 35.0*  MCV 95.7 96.2 95.9 93.2 92.6  PLT 239 228 224 274 359   Cardiac Enzymes: No results found for this basename: CKTOTAL, CKMB, CKMBINDEX, TROPONINI,  in the last 168 hours BNP (last 3 results)  Recent Labs  07/24/13 1440  PROBNP 1142.0*   CBG:  Recent Labs Lab 07/27/13 0646 07/28/13 4818  07/29/13 0643 07/30/13 0629 07/31/13 0646  GLUCAP 85 124* 96 85 101*    Recent Results (from the past 240 hour(s))  URINE CULTURE     Status: None   Collection Time    07/22/13 12:27 PM      Result Value Ref Range Status   Specimen Description URINE, CLEAN CATCH   Final   Special Requests NONE   Final   Culture  Setup Time     Final   Value: 07/22/2013 17:12     Performed at [place]     Final   Value: >=100,000 COLONIES/ML     Performed at [facility]   Culture     Final   Value: Multiple bacterial morphotypes present, none predominant. Suggest appropriate recollection if clinically indicated.     Performed at  [facility]   Report Status 07/23/2013 FINAL   Final  URINE CULTURE     Status: None   Collection Time    07/22/13  9:35 PM      Result Value Ref Range Status   Specimen Description URINE, CLEAN CATCH   Final   Special Requests NONE   Final   Culture  Setup Time     Final   Value: 07/22/2013 21:59     Performed at [facility]     Final   Value: 8,000 COLONIES/ML     Performed at [facility]   Culture     Final   Value: INSIGNIFICANT GROWTH     Performed at [facility]   Report Status 07/23/2013 FINAL   Final  CULTURE, BLOOD (ROUTINE X 2)     Status: None   Collection Time    07/24/13 11:45 AM      Result Value Ref Range Status   Specimen Description BLOOD RIGHT ANTECUBITAL   Final   Special Requests BOTTLES DRAWN AEROBIC ONLY 10CC   Final   Culture  Setup Time     Final   Value: 07/24/2013 18:30     Performed at [facility]   Culture     Final   Value: NO GROWTH 5 DAYS     Performed at [facility]   Report Status 07/30/2013 FINAL   Final  CULTURE, BLOOD (ROUTINE X 2)     Status: None   Collection Time    07/24/13 11:50 AM      Result Value Ref Range Status   Specimen Description BLOOD RIGHT HAND   Final   Special Requests BOTTLES DRAWN AEROBIC ONLY 5CC   Final   Culture  Setup Time     Final   Value: 07/24/2013 18:30     Performed at [facility]   Culture     Final   Value: NO GROWTH 5 DAYS     Performed at [facility]   Report Status 07/30/2013 FINAL   Final  RESPIRATORY VIRUS PANEL     Status: None   Collection Time    07/24/13  3:10 PM      Result Value Ref Range Status   Source - RVPAN NASAL SWAB   Corrected   Comment: CORRECTED ON 02/16 AT 1809: PREVIOUSLY REPORTED AS NASAL SWAB   Respiratory Syncytial Virus A NOT DETECTED   Final   Respiratory Syncytial Virus B NOT DETECTED   Final   Influenza A NOT DETECTED   Final   Influenza B  NOT DETECTED   Final   Parainfluenza 1 NOT  DETECTED   Final   Parainfluenza 2 NOT DETECTED   Final   Parainfluenza 3 NOT DETECTED   Final   Metapneumovirus NOT DETECTED   Final   Rhinovirus NOT DETECTED   Final   Adenovirus NOT DETECTED   Final   Influenza A H1 NOT DETECTED   Final   Influenza A H3 NOT DETECTED   Final   Comment: (NOTE)           Normal Reference Range for each Analyte: NOT DETECTED     Testing performed using the Luminex xTAG Respiratory Viral Panel test     kit.     This test was developed and its performance characteristics determined     by [facility]. It has not been cleared or approved by the [place]     Food and Drug Administration. This test is used for clinical purposes.     It should not be regarded as investigational or for research. This     laboratory is certified under the [place] (CLIA) as qualified to perform high complexity     clinical laboratory testing.     Performed at [place], EXPECTORATED SPUTUM-ASSESSMENT     Status: None   Collection Time    07/24/13  4:13 PM      Result Value Ref Range Status   Specimen Description SPUTUM   Final   Special Requests NONE   Final   Sputum evaluation     Final   Value: THIS SPECIMEN IS ACCEPTABLE. RESPIRATORY CULTURE REPORT TO FOLLOW.   Report Status 07/24/2013 FINAL   Final  CULTURE, RESPIRATORY (NON-EXPECTORATED)     Status: None   Collection Time    07/24/13  4:13 PM      Result Value Ref Range Status   Specimen Description SPUTUM   Final   Special Requests NONE   Final   Gram Stain     Final   Value: FEW WBC PRESENT, PREDOMINANTLY PMN     RARE SQUAMOUS EPITHELIAL CELLS PRESENT     FEW GRAM POSITIVE RODS     FEW GRAM POSITIVE COCCI IN PAIRS     Performed at [facility]   Culture     Final   Value: NORMAL OROPHARYNGEAL FLORA     Performed at [facility]   Report Status 07/26/2013 FINAL   Final  CULTURE, EXPECTORATED SPUTUM-ASSESSMENT     Status:  None   Collection Time    07/28/13  2:41 PM      Result Value Ref Range Status   Specimen Description SPUTUM   Final   Special Requests NONE   Final   Sputum evaluation     Final   Value: THIS SPECIMEN IS ACCEPTABLE. RESPIRATORY CULTURE REPORT TO FOLLOW.   Report Status 07/28/2013 FINAL   Final  CULTURE, RESPIRATORY (NON-EXPECTORATED)     Status: None   Collection Time    07/28/13  2:41 PM      Result Value Ref Range Status   Specimen Description SPUTUM   Final   Special Requests NONE   Final   Gram Stain     Final   Value: RARE WBC PRESENT,BOTH PMN AND MONONUCLEAR     RARE SQUAMOUS EPITHELIAL CELLS PRESENT     RARE GRAM POSITIVE COCCI IN CLUSTERS     Performed at [facility]   Culture     Final   Value: Culture reincubated for better growth     Performed at [facility]   Report Status PENDING   Incomplete     Studies: No results found.  Scheduled Meds: . aspirin EC  81 mg Oral Daily  . buPROPion  150 mg Oral Daily  . ceFEPime (MAXIPIME) IV  2 g Intravenous Q12H  . cholecalciferol  5,000 Units Oral Daily  . docusate sodium  100 mg Oral BID  . flecainide  100 mg Oral Q12H  . furosemide  20 mg Intravenous BID  . HYDROmorphone HCl  16 mg Oral BID  . levalbuterol  0.63 mg Nebulization TID  . metoprolol tartrate  12.5 mg Oral BID  . mometasone-formoterol  2 puff Inhalation BID  . NONFORMULARY OR COMPOUNDED ITEM 1 mL  1 mL Topical BID  . polyethylene glycol  17 g Oral Daily  . potassium chloride  20 mEq Oral BID  . progesterone  100 mg Oral Daily  . pseudoephedrine  120 mg Oral Daily  . Testosterone Propionate  0.25 g Transdermal Once per day on Sun Sat  . Testosterone Propionate  0.5 g Transdermal Once per day on Tue Thu  . thyroid  90 mg Oral QAC breakfast  . vancomycin  1,000 mg Intravenous Q8H   Continuous Infusions:    Principal Problem:   Weakness Active Problems:   Atrial fibrillation   UTI (urinary tract infection)   HTN (hypertension)    Leukocytosis   Hyponatremia   Acute renal failure   CAP (community acquired pneumonia)   COPD (chronic obstructive pulmonary disease)   Tobacco use   Pneumonia   Lung mass   Pleural effusion   Pleuritis    Time spent: >35 minuets     Kinnie Feil  [facility] Pager [phone]. If 7PM-7AM, please contact night-coverage at [email or web address], password [facility] 07/31/2013, 8:39 AM  LOS: 9 days

## 2013-08-01 LAB — CULTURE, RESPIRATORY W GRAM STAIN

## 2013-08-01 LAB — CULTURE, RESPIRATORY

## 2013-08-01 LAB — GLUCOSE, CAPILLARY: GLUCOSE-CAPILLARY: 111 mg/dL — AB (ref 70–99)

## 2013-08-01 MED ORDER — FUROSEMIDE 40 MG PO TABS: 20.0000 mg | ORAL_TABLET | Freq: Every day | ORAL | Status: DC | PRN

## 2013-08-01 MED ORDER — LINEZOLID 600 MG PO TABS: 600.0000 mg | ORAL_TABLET | Freq: Two times a day (BID) | ORAL | Status: DC

## 2013-08-01 MED ORDER — ALBUTEROL SULFATE HFA 108 (90 BASE) MCG/ACT IN AERS: 1.0000 | INHALATION_SPRAY | Freq: Four times a day (QID) | RESPIRATORY_TRACT | Status: DC | PRN

## 2013-08-01 MED ORDER — FLUTICASONE-SALMETEROL 250-50 MCG/DOSE IN AEPB: 1.0000 | INHALATION_SPRAY | Freq: Two times a day (BID) | RESPIRATORY_TRACT | Status: DC

## 2013-08-01 NOTE — Discharge Summary (Addendum)
Physician Discharge Summary  Taylor [name] ZCH:[id number] DOB: 10-18-1950 DOA: 07/22/2013  PCP: Raeanne Gathers, MD  Admit date: 07/22/2013 Discharge date: 08/01/2013  Time spent: >35 minutes  Recommendations for Outpatient Follow-up:  F/u with PCP in 1 week F/u with pulmonologist in 2-3 weeks  Discharge Diagnoses:  Principal Problem:   Weakness Active Problems:   Atrial fibrillation   UTI (urinary tract infection)   HTN (hypertension)   Leukocytosis   Hyponatremia   Acute renal failure   CAP (community acquired pneumonia)   COPD (chronic obstructive pulmonary disease)   Tobacco use   Pneumonia   Lung mass   Pleural effusion   Pleuritis   Discharge Condition: stable   Diet recommendation: heart healthy   Filed Weights   07/29/13 0535 07/30/13 0500 07/31/13 0455  Weight: 107.2 kg (236 lb 5.3 oz) 105.325 kg (232 lb 3.2 oz) 105.4 kg (232 lb 5.8 oz)    History of present illness:  63 y/o female with PMH of HTN, A fib not on AC, h/o tobacco use presented with weakness, hypotension found to have PNA   Hospital Course:  1. CAP/Acute Hypoxemic Respiratory Failure; CT Chest with extensive right-sided PNA and mediastinal/hilar adenopathy; but cannot rule out underlying mass. H/o smoking; added advair, inhalers; need OP PFTs ? Underlying COPD -clinically improved on IV atxIV cefepime/vanc; ,  Afebrile, HR, BP improved; sputum c/s+mrsa; d/w ID Dr. Michel Bickers recommended linezolid for 14 days;  -need repeat CT Chest in 4-6 weeks r/o lung mass; d/w patient agreed to follow up with PCP 2. ARF Resolved with IVF. D/c nsaids, use tylenol for pain  3. Atrial Fibrillation  -Rate controlled. Not on AC, resume BB  4. Course complicated with mild CHF fluid overlord with IVF resuscitation on admission;  -Echo: LVEF 55%, mild pulmonary HTN; patient takes lasix prn at home  -she has improving edema on IV lasix, clinically euvolemic upon d/c; 5. Generalized Weakness; Presumed related to UTI  and PNA.. TSH/B12 ok.  -Seen by PT and no further needs identified.  6. UTI on atx; Cx with insignificant growth.      Procedures:  echo (i.e. Studies not automatically included, echos, thoracentesis, etc; not x-rays)  Consultations:  Pulmonologist   Discharge Exam: Filed Vitals:   08/01/13 0700  BP: 115/48  Pulse: 67  Temp: 98.2 F (36.8 C)  Resp: 18    General: alert Cardiovascular: s1,s2 rrr Respiratory: CTA BL  Discharge Instructions  Discharge Orders   Future Orders Complete By Expires   Diet - low sodium heart healthy  As directed    Discharge instructions  As directed    Comments:     please follow up with primary care doctor in 1-2 week s   Increase activity slowly  As directed        Medication List    STOP taking these medications       celecoxib 200 MG capsule  Commonly known as:  CELEBREX      TAKE these medications       acetaminophen 500 MG tablet  Commonly known as:  TYLENOL  Take 500 mg by mouth every 6 (six) hours as needed for headache.     albuterol 108 (90 BASE) MCG/ACT inhaler  Commonly known as:  PROVENTIL HFA;VENTOLIN HFA  Inhale 1 puff into the lungs every 6 (six) hours as needed for wheezing or shortness of breath.     ALPRAZolam 0.25 MG tablet  Commonly known as:  XANAX  Take 0.25  mg by mouth 2 (two) times daily as needed for anxiety.     aspirin EC 81 MG tablet  Take 81 mg by mouth daily.     buPROPion 150 MG 24 hr tablet  Commonly known as:  WELLBUTRIN XL  Take 150 mg by mouth 2 (two) times daily.     calcium carbonate 600 MG Tabs tablet  Commonly known as:  OS-CAL  Take 600 mg by mouth daily.     CINNAMON PO  Take 1 tablet by mouth daily.     cyclobenzaprine 10 MG tablet  Commonly known as:  FLEXERIL  Take 10 mg by mouth daily as needed for muscle spasms.     DHEA 25 MG Caps  Take 25 mg by mouth daily.     ESTRADIOL TD  Place 1 mL onto the skin 2 (two) times daily. Estradiol 4.22m/gm cream      flecainide 100 MG tablet  Commonly known as:  TAMBOCOR  Take 1 tablet (100 mg total) by mouth 2 (two) times daily.     fluticasone 50 MCG/ACT nasal spray  Commonly known as:  FLONASE  Place 1 spray into both nostrils every 6 (six) hours as needed for allergies or rhinitis.     Fluticasone-Salmeterol 250-50 MCG/DOSE Aepb  Commonly known as:  ADVAIR DISKUS  Inhale 1 puff into the lungs 2 (two) times daily.     furosemide 40 MG tablet  Commonly known as:  LASIX  Take 0.5 tablets (20 mg total) by mouth daily as needed for fluid. Always takes with potassium     HYDROmorphone HCl 8 MG T24a SR tablet  Commonly known as:  EXALGO  Take 16 mg by mouth 2 (two) times daily.     KRILL OIL PO  Take 1 tablet by mouth daily.     linezolid 600 MG tablet  Commonly known as:  ZYVOX  Take 1 tablet (600 mg total) by mouth 2 (two) times daily.     Melatonin 1 MG Tabs  Take 1 mg by mouth at bedtime as needed (sleep).     Methyl Folate Powd  Take 1 capsule by mouth daily.     metoprolol tartrate 25 MG tablet  Commonly known as:  LOPRESSOR  Take 1 tablet (25 mg total) by mouth 2 (two) times daily.     NATURE-THROID 97.5 MG Tabs  Generic drug:  Thyroid  Take 97.5 mg by mouth daily.     OVER THE COUNTER MEDICATION  Take 50 mg by mouth daily. Med name P-5-P     OVER THE COUNTER MEDICATION  Place 10 drops under the tongue 3 (three) times daily. HCG drops     oxyCODONE 15 MG immediate release tablet  Commonly known as:  ROXICODONE  Take 15-30 mg by mouth 3 (three) times daily.     potassium chloride SA 20 MEQ tablet  Commonly known as:  K-DUR,KLOR-CON  Take 20 mEq by mouth daily as needed (swelling). Always takes with furosemide     Pregnenolone Micronized Powd  Take 100 mg by mouth daily.     PREVIDENT 5000 BOOSTER DT  Place 1 application onto teeth daily.     progesterone 100 MG capsule  Commonly known as:  PROMETRIUM  Take 100 mg by mouth Daily.     promethazine 25 MG tablet   Commonly known as:  PHENERGAN  Take 25 mg by mouth every 6 (six) hours as needed for nausea.     pseudoephedrine 120 MG 12  hr tablet  Commonly known as:  SUDAFED  Take 120 mg by mouth every 12 (twelve) hours as needed for congestion.     TESTOSTERONE TD  Place 1 application onto the skin 4 (four) times a week. Testosterone 2% cream.  Apply 1/4 to 1/2 grams (1-2 clicks) to back of knees every Wednesday, Thursday, Saturday and Sunday.     TMG (Trimethylglycine) 500 MG Caps  Take 500 mg by mouth daily.     Vitamin D3 5000 UNITS Caps  Take 5,000 mg by mouth daily.     VITAMIN K2 PO  Take 1 capsule by mouth daily.       Allergies  Allergen Reactions  . Penicillins Itching and Rash       Follow-up Information   Follow up with Raeanne Gathers, MD. Schedule an appointment as soon as possible for a visit in 1 week.   Specialty:  Family Medicine   Contact information:   [address] [place] [phone]        The results of significant diagnostics from this hospitalization (including imaging, microbiology, ancillary and laboratory) are listed below for reference.    Significant Diagnostic Studies: Dg Chest 2 View  07/25/2013   CLINICAL DATA:  Pneumonia.  Cough.  EXAM: CHEST  2 VIEW  COMPARISON:  CT ANGIO CHEST W/CM &/OR WO/CM dated 07/24/2013; DG CHEST 2 VIEW dated 07/22/2013  FINDINGS: Right upper lobe and right lower lobe pulmonary infiltrates noted consistent with severe pneumonia. Similar findings noted on prior chest CT. Left lung is clear. Heart size normal. Right-sided pleural effusion. No pneumothorax. Prior cervical spine fusion.  IMPRESSION: 1. Persistent severe right upper an right lower lobe pulmonary infiltrates consistent with pneumonia. 2. Small right pleural effusion.   Electronically Signed   By: Marcello Moores  Register   On: 07/25/2013 15:55   Dg Chest 2 View  07/22/2013   CLINICAL DATA:  Weakness in the legs.  Chronic back pain.  EXAM:  CHEST  2 VIEW  COMPARISON:  No priors.  FINDINGS: Lung volumes are normal. No consolidative airspace disease. Cephalization of the pulmonary vasculature, without frank pulmonary edema. Heart size is mildly enlarged. In addition, there is dilatation of the central pulmonary arteries. The patient is rotated to the right on today's exam, resulting in distortion of the mediastinal contours and reduced diagnostic sensitivity and specificity for mediastinal pathology. Atherosclerosis in the thoracic aorta.  IMPRESSION: 1. Cardiomegaly with pulmonary venous congestion, but no frank pulmonary edema at this time. 2. Atherosclerosis. 3. Dilatation of the central pulmonary arteries may suggest underlying pulmonary arterial hypertension.   Electronically Signed   By: [name] M.D.   On: 07/22/2013 12:33   Ct Head Wo Contrast  07/22/2013   CLINICAL DATA:  Lightheaded, weakness  EXAM: CT HEAD WITHOUT CONTRAST  CT CERVICAL SPINE WITHOUT CONTRAST  TECHNIQUE: Multidetector CT imaging of the head and cervical spine was performed following the standard protocol without intravenous contrast. Multiplanar CT image reconstructions of the cervical spine were also generated.  COMPARISON:  None.  FINDINGS: CT HEAD FINDINGS  There is no evidence of mass effect, midline shift or extra-axial fluid collections. There is no evidence of a space-occupying lesion or intracranial hemorrhage. There is no evidence of a cortical-based area of acute infarction.  The ventricles and sulci are appropriate for the patient's age. The basal cisterns are patent.  Visualized portions of the orbits are unremarkable. There is an air-fluid level in the right maxillary sinus.  The osseous structures are unremarkable.  CT CERVICAL SPINE FINDINGS  The alignment is anatomic. The vertebral body heights are maintained. Incidental note is made of incomplete fusion of the posterior arch of C1 likely developmental. There is no acute fracture. 3 mm of  anterolisthesis of C2 on C3 secondary to severe right facet arthropathy. The prevertebral soft tissues are normal. The intraspinal soft tissues are not fully imaged on this examination due to poor soft tissue contrast, but there is no soft tissue gross abnormality.  There is anterior cervical fusion from C4 through C6 without hardware failure or complication with intervertebral spacer device is present. There is no significant osseous fusion across the C5-C6 disc space. There is osseous fusion across the C4-5 disc space.  There is severe right facet arthropathy at C2-3 and C3-4.  There is bilateral uncovertebral degenerative change at C4-5 resulting in foraminal stenosis. There is bilateral uncovertebral degenerative changes C5-6 resulting in bilateral foraminal stenosis.  There is a mild broad-based disc osteophyte complex at C6-7. There is bilateral foraminal stenosis at C6-7.  There is a mild broad-based disc osteophyte complex at C7-T1 and T1-T2. There is bilateral facet arthropathy at C7-T1 and T1-2.  The lung apices are clear.  IMPRESSION: 1. No acute intracranial pathology. 2. No acute osseous injury of the cervical spine. 3. Anterior cervical fusion from C4 through C6. 4. Cervical spine spondylosis as described above.   Electronically Signed   By: Kathreen Devoid   On: 07/22/2013 13:02   Ct Angio Chest Pe W/cm &/or Wo Cm  07/24/2013   CLINICAL DATA:  Chest pain, shortness of breath, hypoxia, history AFib and pneumonia, evaluate for pulmonary embolism  EXAM: CT ANGIOGRAPHY CHEST WITH CONTRAST  TECHNIQUE: Multidetector CT imaging of the chest was performed using the standard protocol during bolus administration of intravenous contrast. Multiplanar CT image reconstructions and MIPs were obtained to evaluate the vascular anatomy.  CONTRAST:  59m OMNIPAQUE IOHEXOL 350 MG/ML SOLN  COMPARISON:  DG CHEST 2 VIEW dated 07/22/2013; CT ABD W/CM dated 07/15/2008  FINDINGS: Vascular Findings:  There is suboptimal  opacification of the pulmonary arterial system with the main pulmonary artery measuring only 211 Hounsfield units. Given this limitation, there are no discrete filling defects within the pulmonary arterial tree the suggest pulmonary embolism.  Cardiomegaly. Trace amount of pericardial fluid, presumably physiologic. There is scattered minimal amount of atherosclerotic plaque within a normal caliber thoracic aorta. Conventional configuration of the aortic arch. The branch vessels of the aortic arch are widely patent throughout their imaged course. No definite thoracic aortic dissection or peri aortic stranding.  Review of the MIP images confirms the above findings.   ----------------------------------------------------------------------------------  Nonvascular Findings:  There is are consolidative airspace opacities involving the majority of the right lower lobe with an additional smaller areas of consolidation within the right upper (image 28, series 6) and middle lobe (image 47, series 4). Air bronchograms are noted within many of these consolidative airspace opacities. There are ill-defined areas of ground-glass an apparent slightly nodular interlobular septal thickening within the aerated portions of the right lung. These findings are associated with a small amount of loculated pleural fluid within the right lung apex about the right lateral aspect of the pleural space as well as adjacent to the dependent portion of the right lower lobe. No discrete focal airspace opacities identified within the contralateral left lung.  Mediastinal and right hilar lymphadenopathy with index right-sided paratracheal lymph node measuring approximately 1.2 cm in greatest short axis  diameter (image 15, series 4), index right suprahilar nodal conglomeration measuring approximately 1.8 cm (image 28) and right infrahilar nodal conglomeration measuring 1.6 cm (image 40). No definite hilar or axillary lymphadenopathy.  Scattered  right-sided granulomas including a punctate (approximately 4 mm) granuloma within the right lung apex (image 13, series 6) with associated right hilar partially calcified lymph nodes, the sequela of prior granulomatous infection.  Limited early arterial phase evaluation of the upper abdomen demonstrates the cranial aspect of the previously characterized exophytic cyst arising from the superior pole of the right kidney. Splenic granulomas.  No acute or aggressive osseous abnormalities. Old/healed fracture involving the anterior aspect of the left third rib.  Note is made of an asymmetric approximately 2.3 x 1.7 cm opacity within in the upper outer quadrant of left breast (image 28, series 4).  IMPRESSION: 1. Extensive multi lobar airspace opacities within the right lung, worse within the right lower lobe. While possibly infectious in etiology, given associated mediastinal and right hilar lymphadenopathy an underlying mass is not excluded. As such, a follow-up chest radiograph in 4-6 weeks after treatment is recommended to ensure resolution. Alternatively, further evaluation could be performed with bronchoscopy as clinically indicated. 2. Sequela of prior granulomatous infection as above. 3. Indeterminate approximately 2.3 cm asymmetric nodule within the upper-outer quadrant of the left breast. Further evaluation with physical examination and diagnostic mammogram (if not recently performed) is recommended. Critical Value/emergent results were called by telephone at the time of interpretation on 07/24/2013 at 9:23 AM to Dr. Lelon Frohlich , who verbally acknowledged these results.   Electronically Signed   By: Sandi Mariscal M.D.   On: 07/24/2013 09:43   Ct Cervical Spine Wo Contrast  07/22/2013   CLINICAL DATA:  Lightheaded, weakness  EXAM: CT HEAD WITHOUT CONTRAST  CT CERVICAL SPINE WITHOUT CONTRAST  TECHNIQUE: Multidetector CT imaging of the head and cervical spine was performed following the standard  protocol without intravenous contrast. Multiplanar CT image reconstructions of the cervical spine were also generated.  COMPARISON:  None.  FINDINGS: CT HEAD FINDINGS  There is no evidence of mass effect, midline shift or extra-axial fluid collections. There is no evidence of a space-occupying lesion or intracranial hemorrhage. There is no evidence of a cortical-based area of acute infarction.  The ventricles and sulci are appropriate for the patient's age. The basal cisterns are patent.  Visualized portions of the orbits are unremarkable. There is an air-fluid level in the right maxillary sinus.  The osseous structures are unremarkable.  CT CERVICAL SPINE FINDINGS  The alignment is anatomic. The vertebral body heights are maintained. Incidental note is made of incomplete fusion of the posterior arch of C1 likely developmental. There is no acute fracture. 3 mm of anterolisthesis of C2 on C3 secondary to severe right facet arthropathy. The prevertebral soft tissues are normal. The intraspinal soft tissues are not fully imaged on this examination due to poor soft tissue contrast, but there is no soft tissue gross abnormality.  There is anterior cervical fusion from C4 through C6 without hardware failure or complication with intervertebral spacer device is present. There is no significant osseous fusion across the C5-C6 disc space. There is osseous fusion across the C4-5 disc space.  There is severe right facet arthropathy at C2-3 and C3-4.  There is bilateral uncovertebral degenerative change at C4-5 resulting in foraminal stenosis. There is bilateral uncovertebral degenerative changes C5-6 resulting in bilateral foraminal stenosis.  There is a mild broad-based disc osteophyte complex at C6-7.  There is bilateral foraminal stenosis at C6-7.  There is a mild broad-based disc osteophyte complex at C7-T1 and T1-T2. There is bilateral facet arthropathy at C7-T1 and T1-2.  The lung apices are clear.  IMPRESSION: 1. No acute  intracranial pathology. 2. No acute osseous injury of the cervical spine. 3. Anterior cervical fusion from C4 through C6. 4. Cervical spine spondylosis as described above.   Electronically Signed   By: Kathreen Devoid   On: 07/22/2013 13:02   Mr Lumbar Spine Wo Contrast  07/22/2013   CLINICAL DATA:  Low back pain.  Lower extremity weakness.  EXAM: MRI LUMBAR SPINE WITHOUT CONTRAST  TECHNIQUE: Multiplanar, multisequence MR imaging was performed. No intravenous contrast was administered.  COMPARISON:  CT ABD W/CM dated 07/15/2008  FINDINGS: The lowest lumbar type non-rib-bearing vertebra is labeled as L5. The conus medullaris appears normal. Conus level: L1-2.  Despite efforts by the technologist and patient, motion artifact is present on today's exam and could not be eliminated. This reduces exam sensitivity and specificity. The patient terminated the exam prior to the T1 axial series acquisition.  Multiple left renal cysts are partially visualized. Lower paraspinal muscular atrophy. 2 cm vertebral hemangioma eccentric to the left in the T11 vertebral body. There is 5 mm of anterolisthesis at L4-5 and 3 mm anterolisthesis at L3-4. Loss of disc height noted especially at L5-S1 and to a lesser extent at L4-5.  Additional findings at individual levels are as follows:  T11-12: No impingement. Bilateral facet arthropathy and diffuse disc bulge.  T12-L1: Mild left foraminal stenosis due to facet arthropathy. Diffuse disc bulge.  L1-2: No impingement. Bilateral facet arthropathy, disc bulge, and left paracentral disc protrusion.  L2-3: Borderline left foraminal stenosis due to disc bulge and facet arthropathy.  L3-4: Moderate central stenosis, mild right foraminal stenosis, and mild right subarticular lateral recess stenosis due to facet arthropathy, disc uncovering, and diffuse disc bulge.  L4-5: Borderline central stenosis and borderline right subarticular lateral recess stenosis due to disc uncovering and facet  arthropathy.  L5-S1: Mild right foraminal stenosis due to facet and intervertebral spurring along with disc bulge.  IMPRESSION: 1. Lumbar spondylosis and degenerative disc disease causing moderate impingement at L3-4 and mild impingement at T12-L1 and L5-S1 as noted above. 2. The patient terminated the exam prior to the T1 axial series acquisition. Despite efforts by the technologist and patient, motion artifact is present on today's exam and could not be eliminated. This reduces exam sensitivity and specificity.   Electronically Signed   By: Sherryl Barters M.D.   On: 07/22/2013 14:25   Dg Chest Port 1 View  07/28/2013   CLINICAL DATA:  Cough and shortness of breath  EXAM: PORTABLE CHEST - 1 VIEW  COMPARISON:  07/25/1948  FINDINGS: Cardiac shadow is stable. Significant increased in right-sided infiltrate particular in the right upper lobe is noted. An enlarging right-sided pleural effusion is seen. The left lung is well aerated but demonstrates some very mild patchy changes in the mid lung. No bony abnormality is noted.  IMPRESSION: Increasing bilateral infiltrates.   Electronically Signed   By: Inez Catalina M.D.   On: 07/28/2013 07:49    Microbiology: Recent Results (from the past 240 hour(s))  URINE CULTURE     Status: None   Collection Time    07/22/13 12:27 PM      Result Value Ref Range Status   Specimen Description URINE, CLEAN CATCH   Final   Special Requests NONE   Final  Culture  Setup Time     Final   Value: 07/22/2013 17:12     Performed at [facility]     Final   Value: >=100,000 COLONIES/ML     Performed at [facility]   Culture     Final   Value: Multiple bacterial morphotypes present, none predominant. Suggest appropriate recollection if clinically indicated.     Performed at [facility]   Report Status 07/23/2013 FINAL   Final  URINE CULTURE     Status: None   Collection Time    07/22/13  9:35 PM      Result Value Ref Range  Status   Specimen Description URINE, CLEAN CATCH   Final   Special Requests NONE   Final   Culture  Setup Time     Final   Value: 07/22/2013 21:59     Performed at [facility]     Final   Value: 8,000 COLONIES/ML     Performed at [facility]   Culture     Final   Value: INSIGNIFICANT GROWTH     Performed at [facility]   Report Status 07/23/2013 FINAL   Final  CULTURE, BLOOD (ROUTINE X 2)     Status: None   Collection Time    07/24/13 11:45 AM      Result Value Ref Range Status   Specimen Description BLOOD RIGHT ANTECUBITAL   Final   Special Requests BOTTLES DRAWN AEROBIC ONLY 10CC   Final   Culture  Setup Time     Final   Value: 07/24/2013 18:30     Performed at [facility]   Culture     Final   Value: NO GROWTH 5 DAYS     Performed at [facility]   Report Status 07/30/2013 FINAL   Final  CULTURE, BLOOD (ROUTINE X 2)     Status: None   Collection Time    07/24/13 11:50 AM      Result Value Ref Range Status   Specimen Description BLOOD RIGHT HAND   Final   Special Requests BOTTLES DRAWN AEROBIC ONLY 5CC   Final   Culture  Setup Time     Final   Value: 07/24/2013 18:30     Performed at [facility]   Culture     Final   Value: NO GROWTH 5 DAYS     Performed at [facility]   Report Status 07/30/2013 FINAL   Final  RESPIRATORY VIRUS PANEL     Status: None   Collection Time    07/24/13  3:10 PM      Result Value Ref Range Status   Source - [facility] NASAL SWAB   Corrected   Comment: CORRECTED ON 02/16 AT 1809: PREVIOUSLY REPORTED AS NASAL SWAB   Respiratory Syncytial Virus A NOT DETECTED   Final   Respiratory Syncytial Virus B NOT DETECTED   Final   Influenza A NOT DETECTED   Final   Influenza B NOT DETECTED   Final   Parainfluenza 1 NOT DETECTED   Final   Parainfluenza 2 NOT DETECTED   Final   Parainfluenza 3 NOT DETECTED   Final   Metapneumovirus NOT DETECTED   Final   Rhinovirus NOT DETECTED    Final   Adenovirus NOT DETECTED   Final   Influenza A H1 NOT DETECTED   Final   Influenza A H3  NOT DETECTED   Final   Comment: (NOTE)           Normal Reference Range for each Analyte: NOT DETECTED     Testing performed using the Luminex xTAG Respiratory Viral Panel test     kit.     This test was developed and its performance characteristics determined     by [facility]. It has not been cleared or approved by the [place]     Food and Drug Administration. This test is used for clinical purposes.     It should not be regarded as investigational or for research. This     laboratory is certified under the [place] (CLIA) as qualified to perform high complexity     clinical laboratory testing.     Performed at [facility], EXPECTORATED SPUTUM-ASSESSMENT     Status: None   Collection Time    07/24/13  4:13 PM      Result Value Ref Range Status   Specimen Description SPUTUM   Final   Special Requests NONE   Final   Sputum evaluation     Final   Value: THIS SPECIMEN IS ACCEPTABLE. RESPIRATORY CULTURE REPORT TO FOLLOW.   Report Status 07/24/2013 FINAL   Final  CULTURE, RESPIRATORY (NON-EXPECTORATED)     Status: None   Collection Time    07/24/13  4:13 PM      Result Value Ref Range Status   Specimen Description SPUTUM   Final   Special Requests NONE   Final   Gram Stain     Final   Value: FEW WBC PRESENT, PREDOMINANTLY PMN     RARE SQUAMOUS EPITHELIAL CELLS PRESENT     FEW GRAM POSITIVE RODS     FEW GRAM POSITIVE COCCI IN PAIRS     Performed at [facility]   Culture     Final   Value: NORMAL OROPHARYNGEAL FLORA     Performed at [facility]   Report Status 07/26/2013 FINAL   Final  CULTURE, EXPECTORATED SPUTUM-ASSESSMENT     Status: None   Collection Time    07/28/13  2:41 PM      Result Value Ref Range Status   Specimen Description SPUTUM   Final   Special Requests NONE   Final   Sputum  evaluation     Final   Value: THIS SPECIMEN IS ACCEPTABLE. RESPIRATORY CULTURE REPORT TO FOLLOW.   Report Status 07/28/2013 FINAL   Final  CULTURE, RESPIRATORY (NON-EXPECTORATED)     Status: None   Collection Time    07/28/13  2:41 PM      Result Value Ref Range Status   Specimen Description SPUTUM   Final   Special Requests NONE   Final   Gram Stain     Final   Value: RARE WBC PRESENT,BOTH PMN AND MONONUCLEAR     RARE SQUAMOUS EPITHELIAL CELLS PRESENT     RARE GRAM POSITIVE COCCI IN CLUSTERS     Performed at [facility]   Culture     Final   Value: FEW STAPHYLOCOCCUS AUREUS     Note: RIFAMPIN AND GENTAMICIN SHOULD NOT BE USED AS SINGLE DRUGS FOR TREATMENT OF STAPH INFECTIONS.     Performed at [facility]   Report Status PENDING   Incomplete     Labs: Basic Metabolic Panel:  Recent Labs Lab 07/25/13 1034 07/27/13 0625 07/29/13 0400 07/31/13 0530  NA 138 136* 138 134*  K 3.7 3.9 3.8 3.8  CL 101 102 97 92*  CO2 25 25 30 31   GLUCOSE 107* 101* 107* 111*  BUN 5* 5* 7 9  CREATININE 0.87 0.77 0.76 0.69  CALCIUM 8.8 8.4 8.5 8.8   Liver Function Tests: No results found for this basename: AST, ALT, ALKPHOS, BILITOT, PROT, ALBUMIN,  in the last 168 hours No results found for this basename: LIPASE, AMYLASE,  in the last 168 hours No results found for this basename: AMMONIA,  in the last 168 hours CBC:  Recent Labs Lab 07/25/13 1034 07/26/13 0416 07/27/13 0625 07/29/13 0400 07/31/13 0530  WBC 14.9* 15.1* 16.2* 18.1* 18.9*  HGB 12.5 11.6* 11.0* 10.5* 11.6*  HCT 37.9 35.4* 33.1* 31.5* 35.0*  MCV 95.7 96.2 95.9 93.2 92.6  PLT 239 228 224 274 359   Cardiac Enzymes: No results found for this basename: CKTOTAL, CKMB, CKMBINDEX, TROPONINI,  in the last 168 hours BNP: BNP (last 3 results)  Recent Labs  07/24/13 1440  PROBNP 1142.0*   CBG:  Recent Labs Lab 07/29/13 0643 07/30/13 0629 07/31/13 0646 07/31/13 2107 08/01/13 0701  GLUCAP 96 85 101*  114* 111*       Signed:  Rowe Clack N  [facility] 08/01/2013, 8:34 AM

## 2013-08-01 NOTE — Progress Notes (Signed)
Patient discharged to home accompanied by husband. Discharge instructions and rx given and explained and patient stated understanding. IV was removed and patient left unit in a stable condition with all belongings and O2 at 2L via wheelchair.

## 2013-08-01 NOTE — Care Management Note (Signed)
CARE MANAGEMENT NOTE 08/01/2013  Patient:  SHERBY, MONCAYO   Account Number:  [id number]  Date Initiated:  07/27/2013  Documentation initiated by:  [facility]  Subjective/Objective Assessment:   admitted with pneumonia, UTI     Action/Plan:   plan d/c to home  08/01/13 Patient sats have been in the 60's, oxygen ordered for home.   Anticipated DC Date:  07/29/2013   Anticipated DC Plan:  [place]  CM consult      PAC Choice  [place]   Choice offered to / List presented to:  C-1 Patient   DME arranged  [facility]      DME agency  [place] arranged  HH-1 RN  [place].   Status of service:  Completed, signed off Medicare Important Message given?   (If response is "NO", the following Medicare IM given date fields will be blank) Date Medicare IM given:   Date Additional Medicare IM given:    Discharge Disposition:  [place]  Per UR Regulation:    If discussed at Long Length of Stay Meetings, dates discussed:    Comments:

## 2013-08-01 NOTE — Progress Notes (Signed)
Patient ambulated 100 ft on room air sats at 67%. Patient complained of lightheadedness and dizziness. Patient in chair after ambulation sats came up to 80%, applied 2.5L of O2 to bring sats to 95%. Notified MD. Orders were made to discharge patient on 2 liters of continuous oxygen.

## 2013-11-04 ENCOUNTER — Other Ambulatory Visit: Payer: Self-pay | Admitting: Internal Medicine

## 2013-11-11 ENCOUNTER — Other Ambulatory Visit: Payer: Self-pay | Admitting: Internal Medicine

## 2013-12-12 ENCOUNTER — Other Ambulatory Visit: Payer: Self-pay | Admitting: Orthopedic Surgery

## 2014-02-10 ENCOUNTER — Encounter: Payer: Self-pay | Admitting: Internal Medicine

## 2014-03-24 ENCOUNTER — Other Ambulatory Visit: Payer: Self-pay

## 2014-05-01 ENCOUNTER — Other Ambulatory Visit: Payer: Self-pay | Admitting: Internal Medicine

## 2014-05-19 ENCOUNTER — Ambulatory Visit: Payer: BC Managed Care – PPO | Admitting: Internal Medicine

## 2014-06-18 NOTE — Progress Notes (Signed)
HPI Patient is a 64 year old with a history of intermittant afib, GERD, hypothyroism.  I saw her in Nov 2014. Since seen she was admitted twice for pneumonia  Says that she had atrial fib recurrence in hosp  Since d/c she is recovering  She denies palpitations.  Breathing has improved   Allergies  Allergen Reactions  . Avelox [Moxifloxacin Hcl In Nacl] Hives  . Penicillins Itching and Rash    Current Outpatient Prescriptions  Medication Sig Dispense Refill  . 5-Methyltetrahydrofolate (METHYL FOLATE) POWD Take 1 capsule by mouth daily.     Marland Kitchen acetaminophen (TYLENOL) 500 MG tablet Take 500 mg by mouth every 6 (six) hours as needed for headache.    . albuterol (PROVENTIL HFA;VENTOLIN HFA) 108 (90 BASE) MCG/ACT inhaler Inhale 1 puff into the lungs every 6 (six) hours as needed for wheezing or shortness of breath. 1 Inhaler 1  . ALPRAZolam (XANAX) 0.25 MG tablet Take 0.25 mg by mouth 2 (two) times daily as needed for anxiety.     Marland Kitchen aspirin EC 81 MG tablet Take 81 mg by mouth daily.    . Betaine, Trimethylglycine, (TMG, TRIMETHYLGLYCINE,) 500 MG CAPS Take 500 mg by mouth daily.     Marland Kitchen buPROPion (WELLBUTRIN XL) 150 MG 24 hr tablet Take 150 mg by mouth 2 (two) times daily.     . calcium carbonate (OS-CAL) 600 MG TABS tablet Take 600 mg by mouth daily.    . cetirizine (ZYRTEC) 10 MG tablet Take 10 mg by mouth daily.    . Cholecalciferol (VITAMIN D3) 5000 UNITS CAPS Take 5,000 mg by mouth daily.    Marland Kitchen CINNAMON PO Take 1 tablet by mouth daily.    . cyclobenzaprine (FLEXERIL) 10 MG tablet Take 10 mg by mouth daily as needed for muscle spasms.     Marland Kitchen DHEA 25 MG CAPS Take 25 mg by mouth daily.    Marland Kitchen ESTRADIOL TD Place 1 mL onto the skin 2 (two) times daily. Estradiol 4.5mg /gm cream    . flecainide (TAMBOCOR) 100 MG tablet TAKE 1 TABLET TWO TIMES DAILY. 60 tablet 11  . fluticasone (FLONASE) 50 MCG/ACT nasal spray Place 1 spray into both nostrils every 6 (six) hours as needed for allergies or rhinitis.    .  fluticasone (FLOVENT HFA) 110 MCG/ACT inhaler Inhale 2 puffs into the lungs 2 (two) times daily.    . furosemide (LASIX) 40 MG tablet Take 0.5 tablets (20 mg total) by mouth daily as needed for fluid. Always takes with potassium 30 tablet 0  . HYDROmorphone HCl (EXALGO) 8 MG TB24 Take 16 mg by mouth 2 (two) times daily.     Marland Kitchen KRILL OIL PO Take 1 tablet by mouth daily.    . Melatonin 1 MG TABS Take 1 mg by mouth at bedtime as needed (sleep).     . Menaquinone-7 (VITAMIN K2 PO) Take 1 capsule by mouth daily.    . metoprolol tartrate (LOPRESSOR) 25 MG tablet Take 1 tablet (25 mg total) by mouth 2 (two) times daily. 60 tablet 11  . moxifloxacin (AVELOX) 400 MG tablet Take 400 mg by mouth daily at 8 pm.    . NATURE-THROID 97.5 MG TABS Take 97.5 mg by mouth daily.     Marland Kitchen OVER THE COUNTER MEDICATION Take 50 mg by mouth daily. Med name P-5-P    . OVER THE COUNTER MEDICATION Place 10 drops under the tongue 3 (three) times daily. HCG drops    . oxymorphone (OPANA) 10  MG tablet Take 10 mg by mouth 4 (four) times daily.    . potassium chloride SA (K-DUR,KLOR-CON) 20 MEQ tablet Take 20 mEq by mouth daily as needed (swelling). Always takes with furosemide    . Pregnenolone Micronized POWD Take 100 mg by mouth daily.     . progesterone (PROMETRIUM) 100 MG capsule Take 100 mg by mouth Daily.     . promethazine (PHENERGAN) 25 MG tablet Take 25 mg by mouth every 6 (six) hours as needed for nausea.     . pseudoephedrine (SUDAFED) 120 MG 12 hr tablet Take 120 mg by mouth every 12 (twelve) hours as needed for congestion.     . RABEprazole (ACIPHEX) 20 MG tablet Take 20 mg by mouth daily.    . Sodium Fluoride (PREVIDENT 5000 BOOSTER DT) Place 1 application onto teeth daily.     . TESTOSTERONE TD Place 1 application onto the skin 4 (four) times a week. Testosterone 2% cream.  Apply 1/4 to 1/2 grams (1-2 clicks) to back of knees every Wednesday, Thursday, Saturday and Sunday.     No current facility-administered  medications for this visit.    Past Medical History  Diagnosis Date  . Atrial fibrillation   . HYPERLIPIDEMIA-MIXED 2005    "went away after I started taking fish oil" (07/22/2013)  . CHEST DISCOMFORT   . Palpitations   . DDD (degenerative disc disease), cervical   . DDD (degenerative disc disease), lumbar   . GERD (gastroesophageal reflux disease)   . Chronic neck pain   . Chronic back pain     "started in lower back; getting to be all over my back" (07/22/2013)  . Dysrhythmia   . Chronic bronchitis     "I've had it 2 years in a row; do have some trouble breathing at times; use an inhaler prn; not asthma" (07/22/2013)  . Hypothyroidism   . Type II diabetes mellitus     "diet controlled" (07/22/2013)  . Hepatitis 1971    "don't know if it was A or B" (07/22/2013)  . Arthritis     "hands, neck, back" (07/22/2013)  . Anxiety   . Depression   . Pneumonia 2013    Past Surgical History  Procedure Laterality Date  . Plantar fascia surgery Right 2011  . Anterior cervical decomp/discectomy fusion  ?2003    "w/plating" (07/22/2013)  . Bunionectomy Left 2013  . Dilation and curettage of uterus  1976    Family History  Problem Relation Age of Onset  . Heart disease    . Diabetes Maternal Grandfather   . Heart attack Paternal Grandfather     History   Social History  . Marital Status: Married    Spouse Name: N/A    Number of Children: N/A  . Years of Education: N/A   Occupational History  . Not on file.   Social History Main Topics  . Smoking status: Former Smoker -- 1.00 packs/day for 40 years    Types: Cigarettes    Quit date: 12/22/2007  . Smokeless tobacco: Never Used  . Alcohol Use: Yes     Comment: 07/22/2013 "last drink was in ~ 2005"  . Drug Use: No  . Sexual Activity: Not Currently   Other Topics Concern  . Not on file   Social History Narrative    Review of Systems:  All systems reviewed.  They are negative to the above problem except as previously  stated.  Vital Signs: BP 110/60 mmHg  Pulse 56  Ht 5\' 6"  (1.676 m)  Wt 188 lb (85.276 kg)  BMI 30.36 kg/m2  Physical Exam Patient is in NAD HEENT:  Normocephalic, atraumatic. EOMI, PERRLA.  Neck: JVP is normal. No thyromegaly. No bruits.  Lungs: clear to auscultation. No rales no wheezes.  Heart: Regular rate and rhythm. Normal S1, S2. No S3.   No significant murmurs. PMI not displaced.  Abdomen:  Supple, nontender. Normal bowel sounds. No masses. No hepatomegaly.  Extremities:   Good distal pulses throughout. No lower extremity edema.  Musculoskeletal :moving all extremities.  Neuro:   alert and oriented x3.  CN II-XII grossly intact.  EKG:  SB 56 bpm  z bpm.   Assessment and Plan:  1.  Atrial fibrillation.  Doing well on flecanide Did have some Afib when admtted for pneumonia  She did not sense  Now in SR    She is on ASA 325.  I would keep her on this for now.  3. Lipids  CHeck  F/U 1 year.

## 2014-06-19 ENCOUNTER — Ambulatory Visit (INDEPENDENT_AMBULATORY_CARE_PROVIDER_SITE_OTHER): Payer: BC Managed Care – PPO | Admitting: Internal Medicine

## 2014-06-19 ENCOUNTER — Encounter: Payer: Self-pay | Admitting: Internal Medicine

## 2014-06-19 VITALS — BP 110/60 | HR 56 | Ht 66.0 in | Wt 188.0 lb

## 2014-06-19 DIAGNOSIS — I10 Essential (primary) hypertension: Secondary | ICD-10-CM

## 2014-06-19 DIAGNOSIS — I480 Paroxysmal atrial fibrillation: Secondary | ICD-10-CM

## 2014-06-19 NOTE — Patient Instructions (Signed)
Your physician recommends that you continue on your current medications as directed. Please refer to the Current Medication list given to you today.  Your physician recommends that you have lab work at your desired location - you have been given a written prescription for Lipid panel/AST - please fax results to Dr. Harrington Challenger at [phone]  Your physician wants you to follow-up in: 1 year with Dr. Harrington Challenger.  You will receive a reminder letter in the mail two months in advance. If you don't receive a letter, please call our office to schedule the follow-up appointment.

## 2014-11-30 ENCOUNTER — Encounter: Payer: Self-pay | Admitting: Internal Medicine

## 2015-05-08 ENCOUNTER — Other Ambulatory Visit: Payer: Self-pay | Admitting: *Deleted

## 2015-05-08 MED ORDER — METOPROLOL TARTRATE 25 MG PO TABS: 25.0000 mg | ORAL_TABLET | Freq: Two times a day (BID) | ORAL | Status: DC

## 2015-05-14 ENCOUNTER — Other Ambulatory Visit: Payer: Self-pay

## 2015-05-14 NOTE — Telephone Encounter (Signed)
Medication Detail      Disp Refills Start End     metoprolol tartrate (LOPRESSOR) 25 MG tablet 60 tablet 2 05/08/2015     Sig - Route: Take 1 tablet (25 mg total) by mouth 2 (two) times daily. - Oral    Notes to Pharmacy: This prescription was filled today. Any refills authorized will be placed on file.    E-Prescribing Status: Receipt confirmed by pharmacy (05/08/2015 4:14 PM EST)     Pharmacy    [place], [place]

## 2015-05-17 ENCOUNTER — Telehealth: Payer: Self-pay | Admitting: *Deleted

## 2015-05-17 MED ORDER — METOPROLOL TARTRATE 25 MG PO TABS: 25.0000 mg | ORAL_TABLET | Freq: Two times a day (BID) | ORAL | Status: DC

## 2015-05-17 NOTE — Telephone Encounter (Signed)
Returning patients call about metoprolol. The rx was sent to [facility]. Her Insurance no longer goes through [facility]. Her rx must now come from [facility] in [place].  I have resent rx to [facility].

## 2015-06-13 ENCOUNTER — Other Ambulatory Visit: Payer: Self-pay | Admitting: *Deleted

## 2015-06-13 MED ORDER — FLECAINIDE ACETATE 100 MG PO TABS: ORAL_TABLET | ORAL | Status: DC

## 2015-07-17 ENCOUNTER — Other Ambulatory Visit: Payer: Self-pay | Admitting: Internal Medicine

## 2015-07-17 MED ORDER — FLECAINIDE ACETATE 100 MG PO TABS: ORAL_TABLET | ORAL | Status: DC

## 2015-07-30 ENCOUNTER — Other Ambulatory Visit: Payer: Self-pay | Admitting: Internal Medicine

## 2015-07-30 ENCOUNTER — Ambulatory Visit: Payer: PRIVATE HEALTH INSURANCE | Admitting: Internal Medicine

## 2015-08-10 ENCOUNTER — Other Ambulatory Visit: Payer: Self-pay | Admitting: *Deleted

## 2015-08-10 MED ORDER — METOPROLOL TARTRATE 25 MG PO TABS: 25.0000 mg | ORAL_TABLET | Freq: Two times a day (BID) | ORAL | Status: DC

## 2015-08-17 ENCOUNTER — Ambulatory Visit (INDEPENDENT_AMBULATORY_CARE_PROVIDER_SITE_OTHER): Payer: PRIVATE HEALTH INSURANCE | Admitting: Internal Medicine

## 2015-08-17 ENCOUNTER — Encounter: Payer: Self-pay | Admitting: Internal Medicine

## 2015-08-17 VITALS — BP 112/58 | HR 68 | Ht 66.0 in | Wt 249.4 lb

## 2015-08-17 DIAGNOSIS — I10 Essential (primary) hypertension: Secondary | ICD-10-CM

## 2015-08-17 NOTE — Progress Notes (Signed)
Cardiology Office Note   Date:  08/18/2015   ID:  Taylor [name], DOB 21-Apr-1951, MRN [id number]  PCP:  Taylor Connors, PA-C  Cardiologist:   Dorris Carnes, MD   Pt here fore f/u of PAF   History of Present Illness: Taylor [name] is a 65 y.o. female with a history of PAF, GERD, hypothyorid.  I saw her in Jan 2016   Pt has been on flecanide.   Denies palpitaetion  No dizziness Gets a little SOB with walking   Has had congestion in upper airways this winter       Outpatient Prescriptions Prior to Visit  Medication Sig Dispense Refill  . 5-Methyltetrahydrofolate (METHYL FOLATE) POWD Take 1 capsule by mouth daily.     Marland Kitchen acetaminophen (TYLENOL) 500 MG tablet Take 500 mg by mouth every 6 (six) hours as needed for headache.    . albuterol (PROVENTIL HFA;VENTOLIN HFA) 108 (90 BASE) MCG/ACT inhaler Inhale 1 puff into the lungs every 6 (six) hours as needed for wheezing or shortness of breath. 1 Inhaler 1  . ALPRAZolam (XANAX) 0.25 MG tablet Take 0.25 mg by mouth 2 (two) times daily as needed for anxiety.     Marland Kitchen aspirin EC 81 MG tablet Take 81 mg by mouth daily.    Marland Kitchen buPROPion (WELLBUTRIN XL) 150 MG 24 hr tablet Take 150 mg by mouth 2 (two) times daily.     . calcium carbonate (OS-CAL) 600 MG TABS tablet Take 600 mg by mouth daily.    . Cholecalciferol (VITAMIN D3) 5000 UNITS CAPS Take 5,000 mg by mouth daily.    Marland Kitchen CINNAMON PO Take 1 tablet by mouth daily.    . cyclobenzaprine (FLEXERIL) 10 MG tablet Take 10 mg by mouth daily as needed for muscle spasms.     Marland Kitchen DHEA 25 MG CAPS Take 25 mg by mouth daily.    Marland Kitchen ESTRADIOL TD Place 1 mL onto the skin 2 (two) times daily. Estradiol 4.5mg /gm cream    . flecainide (TAMBOCOR) 100 MG tablet take 1 tablet by mouth twice a day 30 tablet 1  . furosemide (LASIX) 40 MG tablet Take 0.5 tablets (20 mg total) by mouth daily as needed for fluid. Always takes with potassium 30 tablet 0  . HYDROmorphone HCl (EXALGO) 8 MG TB24 Take 16 mg by mouth 2 (two) times  daily.     Marland Kitchen KRILL OIL PO Take 1 tablet by mouth daily.    . Melatonin 1 MG TABS Take 1 mg by mouth at bedtime as needed (sleep).     . metoprolol tartrate (LOPRESSOR) 25 MG tablet Take 1 tablet (25 mg total) by mouth 2 (two) times daily. 60 tablet 0  . NATURE-THROID 97.5 MG TABS Take 97.5 mg by mouth daily.     Marland Kitchen OVER THE COUNTER MEDICATION Take 50 mg by mouth daily. Med name P-5-P    . OVER THE COUNTER MEDICATION Place 10 drops under the tongue 3 (three) times daily. HCG drops    . potassium chloride SA (K-DUR,KLOR-CON) 20 MEQ tablet Take 20 mEq by mouth daily as needed (swelling). Always takes with furosemide    . Pregnenolone Micronized POWD Take 100 mg by mouth daily.     . progesterone (PROMETRIUM) 100 MG capsule Take 100 mg by mouth Daily.     . promethazine (PHENERGAN) 25 MG tablet Take 25 mg by mouth every 6 (six) hours as needed for nausea.     . pseudoephedrine (SUDAFED) 120 MG 12  hr tablet Take 120 mg by mouth every 12 (twelve) hours as needed for congestion.     . RABEprazole (ACIPHEX) 20 MG tablet Take 20 mg by mouth daily.    . Sodium Fluoride (PREVIDENT 5000 BOOSTER DT) Place 1 application onto teeth daily.     . TESTOSTERONE TD Place 1 application onto the skin 4 (four) times a week. Testosterone 2% cream.  Apply 1/4 to 1/2 grams (1-2 clicks) to back of knees every Wednesday, Thursday, Saturday and Sunday.    . fluticasone (FLOVENT HFA) 110 MCG/ACT inhaler Inhale 2 puffs into the lungs 2 (two) times daily.    . Betaine, Trimethylglycine, (TMG, TRIMETHYLGLYCINE,) 500 MG CAPS Take 500 mg by mouth daily. Reported on 08/17/2015    . cetirizine (ZYRTEC) 10 MG tablet Take 10 mg by mouth daily. Reported on 08/17/2015    . fluticasone (FLONASE) 50 MCG/ACT nasal spray Place 1 spray into both nostrils every 6 (six) hours as needed for allergies or rhinitis. Reported on 08/17/2015    . Menaquinone-7 (VITAMIN K2 PO) Take 1 capsule by mouth daily. Reported on 08/17/2015    . moxifloxacin (AVELOX)  400 MG tablet Take 400 mg by mouth daily at 8 pm. Reported on 08/17/2015    . oxymorphone (OPANA) 10 MG tablet Take 10 mg by mouth 4 (four) times daily. Reported on 08/17/2015     No facility-administered medications prior to visit.     Allergies:   Avelox; Venlafaxine; Cephalexin; Linezolid; Moxifloxacin; Nitrofurantoin; Other; and Penicillins   Past Medical History  Diagnosis Date  . Atrial fibrillation ([place])   . HYPERLIPIDEMIA-MIXED 2005    "went away after I started taking fish oil" (07/22/2013)  . CHEST DISCOMFORT   . Palpitations   . DDD (degenerative disc disease), cervical   . DDD (degenerative disc disease), lumbar   . GERD (gastroesophageal reflux disease)   . Chronic neck pain   . Chronic back pain     "started in lower back; getting to be all over my back" (07/22/2013)  . Dysrhythmia   . Chronic bronchitis ([place])     "I've had it 2 years in a row; do have some trouble breathing at times; use an inhaler prn; not asthma" (07/22/2013)  . Hypothyroidism   . Type II diabetes mellitus ([place])     "diet controlled" (07/22/2013)  . Hepatitis 1971    "don't know if it was A or B" (07/22/2013)  . Arthritis     "hands, neck, back" (07/22/2013)  . Anxiety   . Depression   . Pneumonia 2013    Past Surgical History  Procedure Laterality Date  . Plantar fascia surgery Right 2011  . Anterior cervical decomp/discectomy fusion  ?2003    "w/plating" (07/22/2013)  . Bunionectomy Left 2013  . Dilation and curettage of uterus  1976     Social History:  The patient  reports that she quit smoking about 7 years ago. Her smoking use included Cigarettes. She has a 40 pack-year smoking history. She has never used smokeless tobacco. She reports that she drinks alcohol. She reports that she does not use illicit drugs.   Family History:  The patient's family history includes Diabetes in her maternal grandfather; Heart attack in her paternal grandfather.    ROS:  Please see the history of present  illness. All other systems are reviewed and  Negative to the above problem except as noted.    PHYSICAL EXAM: VS:  BP 112/58 mmHg  Pulse 68  Ht  5\' 6"  (1.676 m)  Wt 249 lb 6.4 oz (113.127 kg)  BMI 40.27 kg/m2  GEN: Well nourished, well developed, in no acute distress HEENT: normal Neck: no JVD, carotid bruits, or masses Cardiac: RRR; no murmurs, rubs, or gallops,no edema  Respiratory:  clear to auscultation bilaterally, normal work of breathing GI: soft, nontender, nondistended, + BS  No hepatomegaly  MS: no deformity Moving all extremities   Skin: warm and dry, no rash Neuro:  Strength and sensation are intact Psych: euthymic mood, full affect   EKG:  EKG is ordered today.  SR with PACs 68 bpm  PR 206 msec    Lipid Panel    Component Value Date/Time   CHOL 166 11/17/2007 0844   TRIG 92 11/17/2007 0844   HDL 39.9 11/17/2007 0844   CHOLHDL 4.2 CALC 11/17/2007 0844   VLDL 18 11/17/2007 0844   LDLCALC 108* 11/17/2007 0844      Wt Readings from Last 3 Encounters:  08/17/15 249 lb 6.4 oz (113.127 kg)  06/19/14 188 lb (85.276 kg)  07/31/13 232 lb 5.8 oz (105.4 kg)      ASSESSMENT AND PLAN:  1  PAF  Patient doing well  No symptoms to sugg extended tachycardia Keep on same meds   WIll get outside labs  F/U in 1 year     Signed, Dorris Carnes, MD  08/18/2015 10:08 AM    [address], [place], [place]  [address] Phone: [phone]; Fax: [phone]

## 2015-08-17 NOTE — Patient Instructions (Signed)
Your physician wants you to follow-up in: 1 year with Dr. Ross.  You will receive a reminder letter in the mail two months in advance. If you don't receive a letter, please call our office to schedule the follow-up appointment.  

## 2015-08-29 ENCOUNTER — Other Ambulatory Visit: Payer: Self-pay | Admitting: Internal Medicine

## 2015-09-06 ENCOUNTER — Other Ambulatory Visit: Payer: Self-pay | Admitting: Internal Medicine

## 2015-11-23 ENCOUNTER — Other Ambulatory Visit: Payer: Self-pay | Admitting: Orthopedic Surgery

## 2015-11-23 DIAGNOSIS — M545 Low back pain: Secondary | ICD-10-CM

## 2015-12-03 ENCOUNTER — Other Ambulatory Visit: Payer: PRIVATE HEALTH INSURANCE

## 2016-03-17 ENCOUNTER — Other Ambulatory Visit: Payer: Self-pay | Admitting: Internal Medicine

## 2016-07-31 ENCOUNTER — Encounter: Payer: Self-pay | Admitting: Internal Medicine

## 2016-08-10 NOTE — Progress Notes (Signed)
Cardiology Office Note   Date:  08/11/2016   ID:  POLETH WALSON, DOB 10-27-1950, MRN [id number]  PCP:  [name], PA-C  Cardiologist:   Dorris Carnes, MD    F/U of PAF      History of Present Illness: AADRIKA [name] is a 66 y.o. female with a history of PAF, GERD, hypothyroidsm I saw her in [place] h  2017   No palpitations   Had dizzy spell  Ath[place] dr3esser  Felt weaid  Then put head back  Sat up dizzy  Nauseated with vomited  The [place] drunk pror    She denies CP  Breathing is stable  Does have COPD  No dizziness otherwise       Current Meds  Medication Sig  . 5-Methyltetrahydrofolate (METHYL FOLATE) POWD Take 1 capsule by mouth daily.   Marland Kitchen acetaminophen (TYLENOL) 500 MG tablet Take 500 mg by mouth every 6 (six) hours as needed for headache.  . albuterol (PROVENTIL HFA;VENTOLIN HFA) 108 (90 BASE) MCG/ACT inhaler Inhale 1 puff into the lungs every 6 (six) hours as needed for wheezing or shortness of breath.  . ALPRAZolam (XANAX) 0.25 MG tablet Take 0.25 mg by mouth 2 (two) times daily as needed for anxiety.   Marland Kitchen aspirin EC 81 MG tablet Take 81 mg by mouth daily.  Marland Kitchen buPROPion (WELLBUTRIN XL) 150 MG 24 hr tablet Take 150 mg by mouth 2 (two) times daily.   . calcium carbonate (OS-CAL) 600 MG TABS tablet Take 600 mg by mouth daily.  . Cholecalciferol (VITAMIN D3) 5000 UNITS CAPS Take 5,000 mg by mouth daily.  Marland Kitchen CINNAMON PO Take 1 tablet by mouth daily.  . cyclobenzaprine (FLEXERIL) 10 MG tablet Take 10 mg by mouth daily as needed for muscle spasms.   Marland Kitchen DHEA 25 MG CAPS Take 25 mg by mouth daily.  Marland Kitchen ESTRADIOL TD Place 1 mL onto the skin 2 (two) times daily. Estradiol 4.5mg /gm cream  . flecainide (TAMBOCOR) 100 MG tablet take 1 tablet by mouth twice a day  . furosemide (LASIX) 40 MG tablet Take 0.5 tablets (20 mg total) by mouth daily as needed for fluid. Always takes with potassium  . HYDROmorphone HCl (EXALGO) 8 MG TB24 Take 16 mg by mouth 2 (two) times daily.   Marland Kitchen KRILL OIL PO  Take 1 tablet by mouth daily.  . Melatonin 1 MG TABS Take 1 mg by mouth at bedtime as needed (sleep).   . metoprolol tartrate (LOPRESSOR) 25 MG tablet take 1 tablet by mouth twice a day  . NATURE-THROID 97.5 MG TABS Take 97.5 mg by mouth daily.   Marland Kitchen OVER THE COUNTER MEDICATION Take 50 mg by mouth daily. Med name P-5-P  . OVER THE COUNTER MEDICATION Place 10 drops under the tongue 3 (three) times daily. HCG drops  . potassium chloride SA (K-DUR,KLOR-CON) 20 MEQ tablet Take 20 mEq by mouth daily as needed (swelling). Always takes with furosemide  . Pregnenolone Micronized POWD Take 100 mg by mouth daily.   . progesterone (PROMETRIUM) 100 MG capsule Take 100 mg by mouth Daily.   . promethazine (PHENERGAN) 25 MG tablet Take 25 mg by mouth every 6 (six) hours as needed for nausea.   . pseudoephedrine (SUDAFED) 120 MG 12 hr tablet Take 120 mg by mouth every 12 (twelve) hours as needed for congestion.   . RABEprazole (ACIPHEX) 20 MG tablet Take 20 mg by mouth daily.  . Sodium Fluoride (PREVIDENT 5000 BOOSTER DT) Place 1 application  onto teeth daily.   . TESTOSTERONE TD Place 1 application onto the skin 4 (four) times a week. Testosterone 2% cream.  Apply 1/4 to 1/2 grams (1-2 clicks) to back of knees every Wednesday, Thursday, Saturday and Sunday.  . umeclidinium bromide (INCRUSE ELLIPTA) 62.5 MCG/INH AEPB Inhale 1 puff into the lungs daily.     Allergies:   Avelox [moxifloxacin hcl in nacl]; Venlafaxine; Cephalexin; Linezolid; Moxifloxacin; Nitrofurantoin; Other; and Penicillins   Past Medical History:  Diagnosis Date  . Anxiety   . Arthritis    "hands, neck, back" (07/22/2013)  . Atrial fibrillation ([place])   . CHEST DISCOMFORT   . Chronic back pain    "started in lower back; getting to be all over my back" (07/22/2013)  . Chronic bronchitis ([place])    "I've had it 2 years in a row; do have some trouble breathing at times; use an inhaler prn; not asthma" (07/22/2013)  . Chronic neck pain   . DDD  (degenerative disc disease), cervical   . DDD (degenerative disc disease), lumbar   . Depression   . Dysrhythmia   . GERD (gastroesophageal reflux disease)   . Hepatitis 1971   "don't know if it was A or B" (07/22/2013)  . HYPERLIPIDEMIA-MIXED 2005   "went away after I started taking fish oil" (07/22/2013)  . Hypothyroidism   . Palpitations   . Pneumonia 2013  . Type II diabetes mellitus ([place])    "diet controlled" (07/22/2013)    Past Surgical History:  Procedure Laterality Date  . ANTERIOR CERVICAL DECOMP/DISCECTOMY FUSION  ?2003   "w/plating" (07/22/2013)  . BUNIONECTOMY Left 2013  . DILATION AND CURETTAGE OF UTERUS  1976  . PLANTAR FASCIA SURGERY Right 2011     Social History:  The patient  reports that she quit smoking about 8 years ago. Her smoking use included Cigarettes. She has a 40.00 pack-year smoking history. She has never used smokeless tobacco. She reports that she drinks alcohol. She reports that she does not use drugs.   Family History:  The patient's family history includes Diabetes in her maternal grandfather; Heart attack in her paternal grandfather.    ROS:  Please see the history of present illness. All other systems are reviewed and  Negative to the above problem except as noted.    PHYSICAL EXAM: VS:  BP 136/78 (BP Location: Left Arm)   Pulse 74   Ht 5\' 6"  (1.676 m)   Wt 245 lb 6.4 oz (111.3 kg)   BMI 39.61 kg/m   GEN: Well nourished, well developed, in no acute distress  HEENT: normal  Neck: no JVD, carotid bruits, or masses Cardiac: RRR; no murmurs, rubs, or gallops,no edema  Respiratory:  clear to auscultation bilaterally, normal work of breathing GI: soft, nontender, nondistended, + BS  No hepatomegaly  MS: no deformity Moving all extremities   Skin: warm and dry, no rash Neuro:  Strength and sensation are intact Psych: euthymic mood, full affect   EKG:  EKG is ordered today.  SR 74 bpm  First degree AV block  PR 206 msec     Lipid Panel     Component Value Date/Time   CHOL 166 11/17/2007 0844   TRIG 92 11/17/2007 0844   HDL 39.9 11/17/2007 0844   CHOLHDL 4.2 CALC 11/17/2007 0844   VLDL 18 11/17/2007 0844   LDLCALC 108 (H) 11/17/2007 0844      Wt Readings from Last 3 Encounters:  08/11/16 245 lb 6.4 oz (111.3 kg)  08/17/15 249 lb 6.4 oz (113.1 kg)  06/19/14 188 lb (85.3 kg)      ASSESSMENT AND PLAN:  1  PAF No spells to sugg recurrence  Will get labs firm primary MD/PA  2  HCM  Encouraged stationary exercise for muscle tone/wt loss Pt knows she has to lose wt  This would help    F/U 1 year    Current medicines are reviewed at length with the patient today.  The patient does not have concerns regarding medicines.  Signed, Dorris Carnes, MD  08/11/2016 10:25 AM    [address], [place], [place]  [address] Phone: [phone]; Fax: [phone]

## 2016-08-11 ENCOUNTER — Ambulatory Visit (INDEPENDENT_AMBULATORY_CARE_PROVIDER_SITE_OTHER): Payer: Managed Care, Other (non HMO) | Admitting: Internal Medicine

## 2016-08-11 ENCOUNTER — Encounter: Payer: Self-pay | Admitting: Internal Medicine

## 2016-08-11 VITALS — BP 136/78 | HR 74 | Ht 66.0 in | Wt 245.4 lb

## 2016-08-11 DIAGNOSIS — I480 Paroxysmal atrial fibrillation: Secondary | ICD-10-CM | POA: Diagnosis not present

## 2016-08-11 NOTE — Patient Instructions (Signed)
Your physician recommends that you continue on your current medications as directed. Please refer to the Current Medication list given to you today. Your physician wants you to follow-up in: 1 year with Dr. Ross.  You will receive a reminder letter in the mail two months in advance. If you don't receive a letter, please call our office to schedule the follow-up appointment.  

## 2016-08-28 ENCOUNTER — Other Ambulatory Visit: Payer: Self-pay | Admitting: Internal Medicine

## 2016-09-22 ENCOUNTER — Other Ambulatory Visit: Payer: Self-pay | Admitting: Internal Medicine

## 2016-09-22 MED ORDER — METOPROLOL TARTRATE 25 MG PO TABS: 25.0000 mg | ORAL_TABLET | Freq: Two times a day (BID) | ORAL | 3 refills | Status: DC

## 2017-08-07 ENCOUNTER — Other Ambulatory Visit: Payer: Self-pay | Admitting: Internal Medicine

## 2017-08-25 ENCOUNTER — Other Ambulatory Visit: Payer: Self-pay | Admitting: Internal Medicine

## 2017-09-10 ENCOUNTER — Encounter: Payer: Self-pay | Admitting: Internal Medicine

## 2017-09-10 ENCOUNTER — Ambulatory Visit: Payer: Managed Care, Other (non HMO) | Admitting: Internal Medicine

## 2017-09-10 VITALS — BP 114/70 | HR 58 | Ht 66.0 in | Wt 258.0 lb

## 2017-09-10 DIAGNOSIS — I071 Rheumatic tricuspid insufficiency: Secondary | ICD-10-CM | POA: Diagnosis not present

## 2017-09-10 DIAGNOSIS — J42 Unspecified chronic bronchitis: Secondary | ICD-10-CM

## 2017-09-10 DIAGNOSIS — E782 Mixed hyperlipidemia: Secondary | ICD-10-CM

## 2017-09-10 DIAGNOSIS — I480 Paroxysmal atrial fibrillation: Secondary | ICD-10-CM

## 2017-09-10 NOTE — Patient Instructions (Signed)
Your physician recommends that you continue on your current medications as directed. Please refer to the Current Medication list given to you today.  Your physician has requested that you have an echocardiogram. Echocardiography is a painless test that uses sound waves to create images of your heart. It provides your doctor with information about the size and shape of your heart and how well your heart's chambers and valves are working. This procedure takes approximately one hour. There are no restrictions for this procedure.  Your physician wants you to follow-up in: 1 year with Dr. Harrington Challenger.  You will receive a reminder letter in the mail two months in advance. If you don't receive a letter, please call our office to schedule the follow-up appointment.  Please have results of blood work from PCP faxed to Dr. Harrington Challenger at [phone].

## 2017-09-10 NOTE — Progress Notes (Signed)
Cardiology Office Note   Date:  09/10/2017   ID:  Taylor [name], DOB 1950/10/01, MRN [id number]  PCP:  [name], PA-C  Cardiologist:   Dorris Carnes, MD    F/U of PAF      History of Present Illness: Taylor [name] is a 67 y.o. female with a history of PAF, GERD, hypothyroidsm   Afib initially presented in 2009  Possibly related to URI   Her last episodes of atrial fib were when she had pneumonia in 2014  CT in 2015 showed scattered plaquing of aorta     Pt does get some SOB with activity  Spring is worse for this    Cough   Allergies  SOme productive sputum  No fevers  She follows [facility] for this     Pinched nerve in back  She uses lidocaine patch   Also some itching in feet  Atrrib to neuropathy  Uses lyrica and some topical lidocaine    No palpitations  No dizzy/no syncope     Current Meds  Medication Sig  . 5-Methyltetrahydrofolate (METHYL FOLATE) POWD Take 1 capsule by mouth daily.   Marland Kitchen acetaminophen (TYLENOL) 500 MG tablet Take 500 mg by mouth every 6 (six) hours as needed for headache.  . albuterol (PROVENTIL HFA;VENTOLIN HFA) 108 (90 BASE) MCG/ACT inhaler Inhale 1 puff into the lungs every 6 (six) hours as needed for wheezing or shortness of breath.  . ALPRAZolam (XANAX) 0.25 MG tablet Take 0.25 mg by mouth 2 (two) times daily as needed for anxiety.   Marland Kitchen aspirin EC 81 MG tablet Take 81 mg by mouth daily.  Marland Kitchen buPROPion (WELLBUTRIN XL) 150 MG 24 hr tablet Take 150 mg by mouth 2 (two) times daily.   . calcium carbonate (OS-CAL) 600 MG TABS tablet Take 600 mg by mouth daily.  . Cholecalciferol (VITAMIN D3) 5000 UNITS CAPS Take 5,000 mg by mouth daily.  Marland Kitchen CINNAMON PO Take 1 tablet by mouth daily.  . cyclobenzaprine (FLEXERIL) 10 MG tablet Take 10 mg by mouth daily as needed for muscle spasms.   Marland Kitchen DHEA 25 MG CAPS Take 25 mg by mouth daily.  Marland Kitchen ESTRADIOL TD Place 1 mL onto the skin 2 (two) times daily. Estradiol 4.5mg /gm cream  . flecainide (TAMBOCOR) 100 MG tablet  TAKE 1 TABLET (100 MG TOTAL) BY MOUTH 2 (TWO) TIMES DAILY.  . furosemide (LASIX) 40 MG tablet Take 0.5 tablets (20 mg total) by mouth daily as needed for fluid. Always takes with potassium  . HYDROmorphone HCl (EXALGO) 8 MG TB24 Take 16 mg by mouth 2 (two) times daily.   Marland Kitchen KRILL OIL PO Take 1 tablet by mouth daily.  . Melatonin 1 MG TABS Take 1 mg by mouth at bedtime as needed (sleep).   . metoprolol tartrate (LOPRESSOR) 25 MG tablet Take 1 tablet (25 mg total) by mouth 2 (two) times daily. Please keep upcoming appt for future refills. Thank you  . montelukast (SINGULAIR) 10 MG tablet Take 10 mg by mouth at bedtime.  Marland Kitchen NATURE-THROID 97.5 MG TABS Take 97.5 mg by mouth daily.   . OIL OF OREGANO PO Take 1 capsule by mouth daily.  . potassium chloride SA (K-DUR,KLOR-CON) 20 MEQ tablet Take 20 mEq by mouth daily as needed (swelling). Always takes with furosemide  . Pregnenolone Micronized POWD Take 100 mg by mouth daily.   . progesterone (PROMETRIUM) 100 MG capsule Take 100 mg by mouth Daily.   . promethazine (PHENERGAN)  25 MG tablet Take 25 mg by mouth every 6 (six) hours as needed for nausea.   . pseudoephedrine (SUDAFED) 120 MG 12 hr tablet Take 120 mg by mouth every 12 (twelve) hours as needed for congestion.   . RABEprazole (ACIPHEX) 20 MG tablet Take 20 mg by mouth daily.  . Sodium Fluoride (PREVIDENT 5000 BOOSTER DT) Place 1 application onto teeth daily.   . TESTOSTERONE TD Place 1 application onto the skin 4 (four) times a week. Testosterone 2% cream.  Apply 1/4 to 1/2 grams (1-2 clicks) to back of knees every Wednesday, Thursday, Saturday and Sunday.  . umeclidinium bromide (INCRUSE ELLIPTA) 62.5 MCG/INH AEPB Inhale 1 puff into the lungs daily.  Marland Kitchen vortioxetine HBr (TRINTELLIX) 10 MG TABS tablet Take 10 mg by mouth daily.     Allergies:   Avelox [moxifloxacin hcl in nacl]; Venlafaxine; Cephalexin; Linezolid; Moxifloxacin; Nitrofurantoin; Other; and Penicillins   Past Medical History:    Diagnosis Date  . Anxiety   . Arthritis    "hands, neck, back" (07/22/2013)  . Atrial fibrillation ([place])   . CHEST DISCOMFORT   . Chronic back pain    "started in lower back; getting to be all over my back" (07/22/2013)  . Chronic bronchitis ([place])    "I've had it 2 years in a row; do have some trouble breathing at times; use an inhaler prn; not asthma" (07/22/2013)  . Chronic neck pain   . DDD (degenerative disc disease), cervical   . DDD (degenerative disc disease), lumbar   . Depression   . Dysrhythmia   . GERD (gastroesophageal reflux disease)   . Hepatitis 1971   "don't know if it was A or B" (07/22/2013)  . HYPERLIPIDEMIA-MIXED 2005   "went away after I started taking fish oil" (07/22/2013)  . Hypothyroidism   . Palpitations   . Pneumonia 2013  . Type II diabetes mellitus ([place])    "diet controlled" (07/22/2013)    Past Surgical History:  Procedure Laterality Date  . ANTERIOR CERVICAL DECOMP/DISCECTOMY FUSION  ?2003   "w/plating" (07/22/2013)  . BUNIONECTOMY Left 2013  . DILATION AND CURETTAGE OF UTERUS  1976  . PLANTAR FASCIA SURGERY Right 2011     Social History:  The patient  reports that she quit smoking about 9 years ago. Her smoking use included cigarettes. She has a 40.00 pack-year smoking history. She has never used smokeless tobacco. She reports that she drinks alcohol. She reports that she does not use drugs.   Family History:  The patient's family history includes Diabetes in her maternal grandfather; Heart attack in her paternal grandfather; Heart disease in her unknown relative.    ROS:  Please see the history of present illness. All other systems are reviewed and  Negative to the above problem except as noted.    PHYSICAL EXAM: VS:  BP 114/70   Pulse (!) 58   Ht 5\' 6"  (1.676 m)   Wt 258 lb (117 kg)   SpO2 98%   BMI 41.64 kg/m   Gen  Morbidly obese 67 yo in no acute distress  HEENT: normal  Neck: JVP normal , carotid bruits, or masses Cardiac: RRR   No murmurs  No rubs, or gallops,no edema  Respiratory:  clear to auscultation biilaterally  With forced expiration there is rhonchi and wheezes GI: soft, nontender, nondistended, + BS  No hepatomegaly  MS: no deformity Moving all extremities   Skin: warm and dry  Rash around mouth  SOme erythema at arms  Neuro:  CN II/ XII intact  Otherwise exam deferred   Psych: euthymic mood, full affect   EKG:  EKG is ordered today.   SB 56 bpm   Nonspecific IVCD       Lipid Panel    Component Value Date/Time   CHOL 166 11/17/2007 0844   TRIG 92 11/17/2007 0844   HDL 39.9 11/17/2007 0844   CHOLHDL 4.2 CALC 11/17/2007 0844   VLDL 18 11/17/2007 0844   LDLCALC 108 (H) 11/17/2007 0844      Wt Readings from Last 3 Encounters:  09/10/17 258 lb (117 kg)  08/11/16 245 lb 6.4 oz (111.3 kg)  08/17/15 249 lb 6.4 oz (113.1 kg)      ASSESSMENT AND PLAN:  1  PAF No recurrence  She is on flecanide 100 bid  EKG ok   Get BMET  2  COPD   Signif with forced expiration   Follow[facility]  Echo in 2015 showed mild RV dysfunction and moderate TR  WIll repeat to eval long term effects of COPD on heart  3  TR   Echo to reeval this and RV function   2  Lipids   Will have lipids faxed from primary MD   She had scattered plaquing of aorta on CT scan a few years ago  5.  Neuropathy   Discussed use of lidocaine.  Small areas   Do not want signif absorption for potential drug/drug interaction      F/U 1 year    Current medicines are reviewed at length with the patient today.  The patient does not have concerns regarding medicines.  Signed, Dorris Carnes, MD  09/10/2017 9:54 AM    [address], [place], [place]  [address] Phone: [phone]; Fax: [phone]

## 2017-09-14 ENCOUNTER — Other Ambulatory Visit: Payer: Self-pay

## 2017-09-14 ENCOUNTER — Ambulatory Visit (HOSPITAL_COMMUNITY): Payer: Managed Care, Other (non HMO) | Attending: Cardiology

## 2017-09-14 DIAGNOSIS — G8929 Other chronic pain: Secondary | ICD-10-CM | POA: Insufficient documentation

## 2017-09-14 DIAGNOSIS — M549 Dorsalgia, unspecified: Secondary | ICD-10-CM | POA: Insufficient documentation

## 2017-09-14 DIAGNOSIS — I4891 Unspecified atrial fibrillation: Secondary | ICD-10-CM | POA: Diagnosis not present

## 2017-09-14 DIAGNOSIS — J42 Unspecified chronic bronchitis: Secondary | ICD-10-CM | POA: Diagnosis not present

## 2017-09-14 DIAGNOSIS — E119 Type 2 diabetes mellitus without complications: Secondary | ICD-10-CM | POA: Insufficient documentation

## 2017-09-14 DIAGNOSIS — I071 Rheumatic tricuspid insufficiency: Secondary | ICD-10-CM

## 2017-09-14 DIAGNOSIS — E785 Hyperlipidemia, unspecified: Secondary | ICD-10-CM | POA: Insufficient documentation

## 2017-09-14 NOTE — Addendum Note (Signed)
Addended by: Campbell Riches on: 09/14/2017 07:43 AM   Modules accepted: Orders

## 2017-09-23 ENCOUNTER — Other Ambulatory Visit: Payer: Self-pay | Admitting: Internal Medicine

## 2017-11-01 ENCOUNTER — Other Ambulatory Visit: Payer: Self-pay | Admitting: Internal Medicine

## 2018-04-21 DIAGNOSIS — R918 Other nonspecific abnormal finding of lung field: Secondary | ICD-10-CM | POA: Diagnosis not present

## 2018-04-22 DIAGNOSIS — R110 Nausea: Secondary | ICD-10-CM | POA: Diagnosis not present

## 2018-04-22 DIAGNOSIS — J181 Lobar pneumonia, unspecified organism: Secondary | ICD-10-CM | POA: Diagnosis not present

## 2018-04-28 DIAGNOSIS — M19111 Post-traumatic osteoarthritis, right shoulder: Secondary | ICD-10-CM | POA: Diagnosis not present

## 2018-04-28 DIAGNOSIS — M25511 Pain in right shoulder: Secondary | ICD-10-CM | POA: Diagnosis not present

## 2018-05-11 DIAGNOSIS — J449 Chronic obstructive pulmonary disease, unspecified: Secondary | ICD-10-CM | POA: Diagnosis not present

## 2018-05-11 DIAGNOSIS — R7301 Impaired fasting glucose: Secondary | ICD-10-CM | POA: Diagnosis not present

## 2018-05-11 DIAGNOSIS — F329 Major depressive disorder, single episode, unspecified: Secondary | ICD-10-CM | POA: Diagnosis not present

## 2018-05-11 DIAGNOSIS — R001 Bradycardia, unspecified: Secondary | ICD-10-CM | POA: Diagnosis not present

## 2018-05-11 DIAGNOSIS — E039 Hypothyroidism, unspecified: Secondary | ICD-10-CM | POA: Diagnosis not present

## 2018-05-11 DIAGNOSIS — I4891 Unspecified atrial fibrillation: Secondary | ICD-10-CM | POA: Diagnosis not present

## 2018-05-11 DIAGNOSIS — G894 Chronic pain syndrome: Secondary | ICD-10-CM | POA: Diagnosis not present

## 2018-05-11 DIAGNOSIS — N183 Chronic kidney disease, stage 3 (moderate): Secondary | ICD-10-CM | POA: Diagnosis not present

## 2018-05-11 DIAGNOSIS — Z0000 Encounter for general adult medical examination without abnormal findings: Secondary | ICD-10-CM | POA: Diagnosis not present

## 2018-05-11 DIAGNOSIS — Z780 Asymptomatic menopausal state: Secondary | ICD-10-CM | POA: Diagnosis not present

## 2018-05-11 DIAGNOSIS — Z1239 Encounter for other screening for malignant neoplasm of breast: Secondary | ICD-10-CM | POA: Diagnosis not present

## 2018-05-11 DIAGNOSIS — R9431 Abnormal electrocardiogram [ECG] [EKG]: Secondary | ICD-10-CM | POA: Diagnosis not present

## 2018-05-11 DIAGNOSIS — Z23 Encounter for immunization: Secondary | ICD-10-CM | POA: Diagnosis not present

## 2018-05-11 DIAGNOSIS — E782 Mixed hyperlipidemia: Secondary | ICD-10-CM | POA: Diagnosis not present

## 2018-05-14 ENCOUNTER — Emergency Department (INDEPENDENT_AMBULATORY_CARE_PROVIDER_SITE_OTHER)
Admission: EM | Admit: 2018-05-14 | Discharge: 2018-05-14 | Disposition: A | Discharge status: Home / Self Care | Payer: Managed Care, Other (non HMO) | Source: Home / Self Care

## 2018-05-14 ENCOUNTER — Other Ambulatory Visit: Payer: Self-pay

## 2018-05-14 ENCOUNTER — Emergency Department (INDEPENDENT_AMBULATORY_CARE_PROVIDER_SITE_OTHER): Payer: Managed Care, Other (non HMO)

## 2018-05-14 DIAGNOSIS — R0602 Shortness of breath: Secondary | ICD-10-CM

## 2018-05-14 DIAGNOSIS — J189 Pneumonia, unspecified organism: Secondary | ICD-10-CM

## 2018-05-14 DIAGNOSIS — R05 Cough: Secondary | ICD-10-CM | POA: Diagnosis not present

## 2018-05-14 LAB — POCT CBC W AUTO DIFF (K'VILLE URGENT CARE)

## 2018-05-14 MED ORDER — CLARITHROMYCIN ER 500 MG PO TB24: 1000.0000 mg | ORAL_TABLET | Freq: Every day | ORAL | 0 refills | Status: DC

## 2018-05-14 NOTE — ED Triage Notes (Signed)
Woke up Sunday with a sore throat.  Cold sx rest of Sunday.  Increased weight gain, hard to take a deep breath, and fatigue.

## 2018-05-14 NOTE — Discharge Instructions (Signed)
If at all worse go to the emergency room.  Use your inhaler or your nebulizer every 4-6 hours if needed for wheezing or shortness of breath  Take the clarithromycin XL 500 mg 2 pills daily with a little food  Drink lots of water and avoid excessive salt since you said your weight is gone up.  Plan to get rechecked either Sunday or Monday either here or at your primary care.

## 2018-05-14 NOTE — ED Provider Notes (Signed)
[facility] [name] CARE    CSN: [id number] Arrival date & time: 05/14/18  1145     History   Chief Complaint Chief Complaint  Patient presents with  . Cough  . Shortness of Breath  . Fatigue    HPI Taylor [name] is a 67 y.o. female.   HPI 67 year old lady who came in today with the complaint of having been sick since about Sunday.  It started with a sore throat and cold-like symptoms.  Then she is gotten worse and today felt short of breath.  She is used her inhaler not long ago so she is feeling a little bit better right now.  She is been coughing, mostly nonproductive.  She does not smoke.  She had her welcome to Medicare physical 3 days ago.  On those labs her WBCs were apparently 15,000.  She had already started getting her cold symptoms at that time.  She has had a number of episodes of pneumonia.  Last was treated for it a few months ago.  She is possibly allergic to long list of antibiotics.  Apparently when she got a rash from her amoxicillin, with 1 of the initial pneumonias a few years back, she was treated with first 1 antibiotic and then the next and she thinks the rash may have come from the initial amoxicillin only.  However it is very unclear exactly what all her allergies are. Past Medical History:  Diagnosis Date  . Anxiety   . Arthritis    "hands, neck, back" (07/22/2013)  . Atrial fibrillation ([place])   . CHEST DISCOMFORT   . Chronic back pain    "started in lower back; getting to be all over my back" (07/22/2013)  . Chronic bronchitis ([place])    "I've had it 2 years in a row; do have some trouble breathing at times; use an inhaler prn; not asthma" (07/22/2013)  . Chronic neck pain   . DDD (degenerative disc disease), cervical   . DDD (degenerative disc disease), lumbar   . Depression   . Dysrhythmia   . GERD (gastroesophageal reflux disease)   . Hepatitis 1971   "don't know if it was A or B" (07/22/2013)  . HYPERLIPIDEMIA-MIXED 2005   "went away after I  started taking fish oil" (07/22/2013)  . Hypothyroidism   . Palpitations   . Pneumonia 2013  . Type II diabetes mellitus ([place])    "diet controlled" (07/22/2013)    Patient Active Problem List   Diagnosis Date Noted  . Pleuritis 07/25/2013  . CAP (community acquired pneumonia) 07/24/2013  . COPD (chronic obstructive pulmonary disease) ([place]) 07/24/2013  . Tobacco use 07/24/2013  . Pneumonia 07/24/2013  . Lung mass 07/24/2013  . Pleural effusion 07/24/2013  . UTI (urinary tract infection) 07/22/2013  . Weakness 07/22/2013  . HTN (hypertension) 07/22/2013  . Leukocytosis 07/22/2013  . Hyponatremia 07/22/2013  . Acute renal failure ([place]) 07/22/2013  . Atrial fibrillation ([place]) 08/06/2008    Past Surgical History:  Procedure Laterality Date  . ANTERIOR CERVICAL DECOMP/DISCECTOMY FUSION  ?2003   "w/plating" (07/22/2013)  . BUNIONECTOMY Left 2013  . DILATION AND CURETTAGE OF UTERUS  1976  . PLANTAR FASCIA SURGERY Right 2011    OB History   None      Home Medications    Prior to Admission medications   Medication Sig Start Date End Date Taking? Authorizing Provider  5-Methyltetrahydrofolate (METHYL FOLATE) POWD Take 1 capsule by mouth daily.     [provider]  acetaminophen (TYLENOL) 500 MG tablet Take 500 mg by mouth every 6 (six) hours as needed for headache.    [provider]  albuterol (PROVENTIL HFA;VENTOLIN HFA) 108 (90 BASE) MCG/ACT inhaler Inhale 1 puff into the lungs every 6 (six) hours as needed for wheezing or shortness of breath. 08/01/13   Kinnie Feil, MD  ALPRAZolam Duanne Moron) 0.25 MG tablet Take 0.25 mg by mouth 2 (two) times daily as needed for anxiety.  11/29/11   [provider]  aspirin EC 81 MG tablet Take 81 mg by mouth daily.    [provider]  buPROPion (WELLBUTRIN XL) 150 MG 24 hr tablet Take 150 mg by mouth 2 (two) times daily.  11/19/11   [provider]  calcium carbonate (OS-CAL) 600 MG TABS tablet  Take 600 mg by mouth daily.    [provider]  Cholecalciferol (VITAMIN D3) 5000 UNITS CAPS Take 5,000 mg by mouth daily.    [provider]  CINNAMON PO Take 1 tablet by mouth daily.    [provider]  clarithromycin (BIAXIN XL) 500 MG 24 hr tablet Take 2 tablets (1,000 mg total) by mouth daily. 05/14/18   Posey Boyer, MD  cyclobenzaprine (FLEXERIL) 10 MG tablet Take 10 mg by mouth daily as needed for muscle spasms.     [provider]  DHEA 25 MG CAPS Take 25 mg by mouth daily.    [provider]  ESTRADIOL TD Place 1 mL onto the skin 2 (two) times daily. Estradiol 4.5mg /gm cream    [provider]  flecainide (TAMBOCOR) 100 MG tablet TAKE 1 TABLET BY MOUTH TWICE A DAY 09/23/17   Fay Records, MD  furosemide (LASIX) 40 MG tablet Take 0.5 tablets (20 mg total) by mouth daily as needed for fluid. Always takes with potassium 08/01/13   Buriev, Arie Sabina, MD  HYDROmorphone HCl (EXALGO) 8 MG TB24 Take 16 mg by mouth 2 (two) times daily.     [provider]  KRILL OIL PO Take 1 tablet by mouth daily.    [provider]  Melatonin 1 MG TABS Take 1 mg by mouth at bedtime as needed (sleep).     [provider]  metoprolol tartrate (LOPRESSOR) 25 MG tablet Take 1 tablet (25 mg total) by mouth 2 (two) times daily. 11/03/17   Fay Records, MD  montelukast (SINGULAIR) 10 MG tablet Take 10 mg by mouth at bedtime.    [provider]  NATURE-THROID 97.5 MG TABS Take 97.5 mg by mouth daily.  10/13/11   [provider]  OIL OF OREGANO PO Take 1 capsule by mouth daily.    [provider]  potassium chloride SA (K-DUR,KLOR-CON) 20 MEQ tablet Take 20 mEq by mouth daily as needed (swelling). Always takes with furosemide    [provider]  Pregnenolone Micronized POWD Take 100 mg by mouth daily.     [provider]  progesterone (PROMETRIUM) 100 MG capsule Take 100 mg by mouth Daily.  11/03/11    [provider]  promethazine (PHENERGAN) 25 MG tablet Take 25 mg by mouth every 6 (six) hours as needed for nausea.  04/14/13   [provider]  pseudoephedrine (SUDAFED) 120 MG 12 hr tablet Take 120 mg by mouth every 12 (twelve) hours as needed for congestion.     [provider]  RABEprazole (ACIPHEX) 20 MG tablet Take 20 mg by mouth daily.    [provider]  Sodium Fluoride (PREVIDENT 5000 BOOSTER DT) Place 1 application onto teeth daily.     [provider]  TESTOSTERONE TD Place 1 application onto the skin 4 (four) times a week. Testosterone 2% cream.  Apply 1/4 to 1/2 grams (1-2 clicks) to back of knees every Wednesday, Thursday, Saturday and Sunday.    [provider]  umeclidinium bromide (INCRUSE ELLIPTA) 62.5 MCG/INH AEPB Inhale 1 puff into the lungs daily. 03/21/16   [provider]  vortioxetine HBr (TRINTELLIX) 10 MG TABS tablet Take 10 mg by mouth daily.    [provider]    Family History Family History  Problem Relation Age of Onset  . Heart disease Unknown   . Diabetes Maternal Grandfather   . Heart attack Paternal Grandfather     Social History Social History   Tobacco Use  . Smoking status: Former Smoker    Packs/day: 1.00    Years: 40.00    Pack years: 40.00    Types: Cigarettes    Last attempt to quit: 12/22/2007    Years since quitting: 10.4  . Smokeless tobacco: Never Used  Substance Use Topics  . Alcohol use: Yes    Comment: 07/22/2013 "last drink was in ~ 2005"  . Drug use: No     Allergies   Avelox [moxifloxacin hcl in nacl]; Venlafaxine; Cephalexin; Linezolid; Moxifloxacin; Nitrofurantoin; Other; and Penicillins   Review of Systems Review of Systems Constitutional: Felt weak and short of breath today.  She does report having gained about 10 pounds in the past week, uncertain cause. HEENT: Mild upper respiratory congestion and sore throat, improving. Respiratory: As  above Cardiovascular: No chest pain or palpitations.  She is short of breath Gastrointestinal: Unremarkable Genitourinary: Unremarkable Musculoskeletal: Unremarkable.  She has not had any swelling of her ankles.  Physical Exam Triage Vital Signs ED Triage Vitals  Enc Vitals Group     BP 05/14/18 1209 98/60     Pulse Rate 05/14/18 1209 96     Resp 05/14/18 1209 18     Temp 05/14/18 1209 98.6 F (37 C)     Temp Source 05/14/18 1209 Oral     SpO2 05/14/18 1209 98 %     Weight 05/14/18 1210 250 lb (113.4 kg)     Height 05/14/18 1210 5\' 6"  (1.676 m)     Head Circumference --      Peak Flow --      Pain Score 05/14/18 1210 0     Pain Loc --      Pain Edu? --      Excl. in [place]? --    No data found.  Updated Vital Signs BP 98/60 (BP Location: Right Arm)   Pulse 96   Temp 98.6 F (37 C) (Oral)   Resp 18   Ht 5\' 6"  (1.676 m)   Wt 113.4 kg   SpO2 98%   BMI 40.35 kg/m   Visual Acuity Right Eye Distance:   Left Eye Distance:   Bilateral Distance:    Right Eye Near:   Left Eye Near:    Bilateral Near:     Physical Exam Alert and oriented female, not visibly any respiratory distress.  TMs normal.  Throat clear.  Nose not congested right now.  Neck supple without significant nodes.  Chest clear to auscultation.  No rhonchi, rales, or wheezes could be heard.  She has a somewhat barrel chested body shape and less than normal air exchange.  O2 saturation was good.  She is not tender in the chest wall though she is had some right-sided chest wall pains when she moved around apparently.  Abdomen was soft without mass or tenderness.  When she leaned back and look like she had a ventral hernia, but when she is lying down I did not feel it.  May just have some diastases recti.  Extremities without edema.  UC Treatments / Results  Labs (all labs ordered are listed, but only abnormal results are displayed) Labs Reviewed  POCT CBC W AUTO DIFF ([place])       EKG None  Radiology Dg Chest 2 View  Result Date: 05/14/2018 CLINICAL DATA:  Cough and shortness of breast for 2 weeks. EXAM: CHEST - 2 VIEW COMPARISON:  Chest CT 08/07/2014 FINDINGS: Cardiomediastinal silhouette is normal. Mediastinal contours appear intact. There is no evidence of pleural effusion or pneumothorax. Patchy airspace consolidation in the right middle lobe and right upper lobe. More subtle peribronchial airspace disease seen in the lingula. Osseous structures are without acute abnormality. Soft tissues are grossly normal. IMPRESSION: Multifocal pneumonia. Electronically Signed   By: Fidela Salisbury M.D.   On: 05/14/2018 13:14    Patchy pneumonia  Procedures Procedures (including critical care time)  CBC had a white blood count of 19,000.  Up from the 15,000 she had elsewhere a few days ago.  Medications Ordered in UC Medications - No data to display    Initial Impression / Assessment and Plan / UC Course  I have reviewed the triage vital signs and the nursing notes.  Pertinent labs & imaging results that were available during my care of the patient were reviewed by me and considered in my medical decision making (see chart for details).     Patient with a history of frequent pneumonias in the past now with a new community-acquired pneumonia.  It is difficult to make antibiotic choices with her, and cannot order dual coverage very easily at this time so I am putting her on Biaxin (clarithromycin) 500 mg twice a day. Final Clinical Impressions(s) / UC Diagnoses   Final diagnoses:  Community acquired pneumonia, unspecified laterality  Shortness of breath     Discharge Instructions     If at all worse go to the emergency room.  Use your inhaler or your nebulizer every 4-6 hours if needed for wheezing or shortness of breath  Take the clarithromycin XL 500 mg 2 pills daily with a little food  Drink lots of water and avoid excessive salt since you said  your weight is gone up.  Plan to get rechecked either Sunday or Monday either here or at your primary care.    ED Prescriptions    Medication Sig Dispense Auth. Provider   clarithromycin (BIAXIN XL) 500 MG 24 hr tablet Take 2 tablets (1,000 mg total) by mouth daily. 20 tablet Posey Boyer, MD     Controlled Substance Prescriptions [place] Controlled Substance Registry consulted? No   Posey Boyer, MD 05/14/18 1416

## 2018-05-16 ENCOUNTER — Encounter: Payer: Self-pay | Admitting: *Deleted

## 2018-05-16 ENCOUNTER — Emergency Department (INDEPENDENT_AMBULATORY_CARE_PROVIDER_SITE_OTHER)
Admission: EM | Admit: 2018-05-16 | Discharge: 2018-05-16 | Disposition: A | Discharge status: Home / Self Care | Payer: PPO | Source: Home / Self Care | Attending: Family Medicine | Admitting: Family Medicine

## 2018-05-16 ENCOUNTER — Other Ambulatory Visit: Payer: Self-pay

## 2018-05-16 DIAGNOSIS — J441 Chronic obstructive pulmonary disease with (acute) exacerbation: Secondary | ICD-10-CM

## 2018-05-16 DIAGNOSIS — J189 Pneumonia, unspecified organism: Secondary | ICD-10-CM | POA: Diagnosis not present

## 2018-05-16 HISTORY — DX: Chronic obstructive pulmonary disease, unspecified: J44.9

## 2018-05-16 LAB — POCT CBC W AUTO DIFF (K'VILLE URGENT CARE)

## 2018-05-16 MED ORDER — IPRATROPIUM-ALBUTEROL 0.5-2.5 (3) MG/3ML IN SOLN
3.0000 mL | Freq: Once | RESPIRATORY_TRACT | Status: AC
  Administered 2018-05-16: 3 mL via RESPIRATORY_TRACT

## 2018-05-16 MED ORDER — IPRATROPIUM-ALBUTEROL 0.5-2.5 (3) MG/3ML IN SOLN: 3.0000 mL | Freq: Four times a day (QID) | RESPIRATORY_TRACT | 1 refills | Status: DC | PRN

## 2018-05-16 MED ORDER — METHYLPREDNISOLONE ACETATE 80 MG/ML IJ SUSP
80.0000 mg | Freq: Once | INTRAMUSCULAR | Status: AC
  Administered 2018-05-16: 80 mg via INTRAMUSCULAR

## 2018-05-16 NOTE — Discharge Instructions (Addendum)
Take plain guaifenesin (1200mg  extended release tabs such as Mucinex) twice daily, with plenty of water, for cough and congestion.   Get adequate rest.   Continue Biaxin.  Switch to Duoneb for your nebulizer and use as directed. Continue Advair Stop all antihistamines (Zyrtec, Nyquil, etc) for now, and other non-prescription cough/cold preparations.   If symptoms become significantly worse during the night or over the weekend, proceed to the local emergency room.

## 2018-05-16 NOTE — ED Provider Notes (Signed)
[name]    CSN: [id number] Arrival date & time: 05/16/18  1113     History   Chief Complaint Chief Complaint  Patient presents with  . Cough  . Shortness of Breath    HPI Taylor [name] is a 67 y.o. female.   Patient was diagnosed with multifocal pneumonia in our clinic two days ago, and treated with Biaxin.  She has a history of COPD and multiple past episodes of pneumonia.  She reports that she does not feel better, although not worse.  She feels better after using her nebulizer at home, but uses it intermittently.  She denies pleuritic pain.   She has diabetes, and recalls that her last Hgb A1C was 6.3 six days ago. Her last WBC was 19.9 on 05/14/18.  The history is provided by the patient and the spouse.    Past Medical History:  Diagnosis Date  . Anxiety   . Arthritis    "hands, neck, back" (07/22/2013)  . Atrial fibrillation ([place])   . CHEST DISCOMFORT   . Chronic back pain    "started in lower back; getting to be all over my back" (07/22/2013)  . Chronic bronchitis ([place])    "I've had it 2 years in a row; do have some trouble breathing at times; use an inhaler prn; not asthma" (07/22/2013)  . Chronic neck pain   . COPD (chronic obstructive pulmonary disease) ([place])   . DDD (degenerative disc disease), cervical   . DDD (degenerative disc disease), lumbar   . Depression   . Dysrhythmia   . GERD (gastroesophageal reflux disease)   . Hepatitis 1971   "don't know if it was A or B" (07/22/2013)  . HYPERLIPIDEMIA-MIXED 2005   "went away after I started taking fish oil" (07/22/2013)  . Hypothyroidism   . Palpitations   . Pneumonia 2013  . Type II diabetes mellitus ([place])    "diet controlled" (07/22/2013)    Patient Active Problem List   Diagnosis Date Noted  . Pleuritis 07/25/2013  . CAP (community acquired pneumonia) 07/24/2013  . COPD (chronic obstructive pulmonary disease) ([place]) 07/24/2013  . Tobacco use 07/24/2013  . Pneumonia 07/24/2013  . Lung  mass 07/24/2013  . Pleural effusion 07/24/2013  . UTI (urinary tract infection) 07/22/2013  . Weakness 07/22/2013  . HTN (hypertension) 07/22/2013  . Leukocytosis 07/22/2013  . Hyponatremia 07/22/2013  . Acute renal failure ([place]) 07/22/2013  . Atrial fibrillation ([place]) 08/06/2008    Past Surgical History:  Procedure Laterality Date  . ANTERIOR CERVICAL DECOMP/DISCECTOMY FUSION  ?2003   "w/plating" (07/22/2013)  . BUNIONECTOMY Left 2013  . DILATION AND CURETTAGE OF UTERUS  1976  . PLANTAR FASCIA SURGERY Right 2011    OB History   None      Home Medications    Prior to Admission medications   Medication Sig Start Date End Date Taking? Authorizing Provider  5-Methyltetrahydrofolate (METHYL FOLATE) POWD Take 1 capsule by mouth daily.     [provider]  acetaminophen (TYLENOL) 500 MG tablet Take 500 mg by mouth every 6 (six) hours as needed for headache.    [provider]  albuterol (PROVENTIL HFA;VENTOLIN HFA) 108 (90 BASE) MCG/ACT inhaler Inhale 1 puff into the lungs every 6 (six) hours as needed for wheezing or shortness of breath. 08/01/13   Kinnie Feil, MD  ALPRAZolam Duanne Moron) 0.25 MG tablet Take 0.25 mg by mouth 2 (two) times daily as needed for anxiety.  11/29/11   [provider]  aspirin EC 81 MG tablet Take 81 mg by mouth daily.    [provider]  buPROPion (WELLBUTRIN XL) 150 MG 24 hr tablet Take 150 mg by mouth 2 (two) times daily.  11/19/11   [provider]  calcium carbonate (OS-CAL) 600 MG TABS tablet Take 600 mg by mouth daily.    [provider]  Cholecalciferol (VITAMIN D3) 5000 UNITS CAPS Take 5,000 mg by mouth daily.    [provider]  CINNAMON PO Take 1 tablet by mouth daily.    [provider]  clarithromycin (BIAXIN XL) 500 MG 24 hr tablet Take 2 tablets (1,000 mg total) by mouth daily. 05/14/18   Posey Boyer, MD  cyclobenzaprine (FLEXERIL) 10 MG tablet Take 10 mg by mouth daily  as needed for muscle spasms.     [provider]  DHEA 25 MG CAPS Take 25 mg by mouth daily.    [provider]  ESTRADIOL TD Place 1 mL onto the skin 2 (two) times daily. Estradiol 4.5mg /gm cream    [provider]  flecainide (TAMBOCOR) 100 MG tablet TAKE 1 TABLET BY MOUTH TWICE A DAY 09/23/17   Fay Records, MD  furosemide (LASIX) 40 MG tablet Take 0.5 tablets (20 mg total) by mouth daily as needed for fluid. Always takes with potassium 08/01/13   Buriev, Arie Sabina, MD  HYDROmorphone HCl (EXALGO) 8 MG TB24 Take 16 mg by mouth 2 (two) times daily.     [provider]  ipratropium-albuterol (DUONEB) 0.5-2.5 (3) MG/3ML SOLN Take 3 mLs by nebulization every 6 (six) hours as needed. 05/16/18   Kandra Nicolas, MD  KRILL OIL PO Take 1 tablet by mouth daily.    [provider]  Melatonin 1 MG TABS Take 1 mg by mouth at bedtime as needed (sleep).     [provider]  metoprolol tartrate (LOPRESSOR) 25 MG tablet Take 1 tablet (25 mg total) by mouth 2 (two) times daily. 11/03/17   Fay Records, MD  montelukast (SINGULAIR) 10 MG tablet Take 10 mg by mouth at bedtime.    [provider]  NATURE-THROID 97.5 MG TABS Take 97.5 mg by mouth daily.  10/13/11   [provider]  OIL OF OREGANO PO Take 1 capsule by mouth daily.    [provider]  potassium chloride SA (K-DUR,KLOR-CON) 20 MEQ tablet Take 20 mEq by mouth daily as needed (swelling). Always takes with furosemide    [provider]  Pregnenolone Micronized POWD Take 100 mg by mouth daily.     [provider]  progesterone (PROMETRIUM) 100 MG capsule Take 100 mg by mouth Daily.  11/03/11   [provider]  promethazine (PHENERGAN) 25 MG tablet Take 25 mg by mouth every 6 (six) hours as needed for nausea.  04/14/13   [provider]  pseudoephedrine (SUDAFED) 120 MG 12 hr tablet Take 120 mg by mouth every 12 (twelve) hours as needed for  congestion.     [provider]  RABEprazole (ACIPHEX) 20 MG tablet Take 20 mg by mouth daily.    [provider]  Sodium Fluoride (PREVIDENT 5000 BOOSTER DT) Place 1 application onto teeth daily.     [provider]  TESTOSTERONE TD Place 1 application onto the skin 4 (four) times a week. Testosterone 2% cream.  Apply 1/4 to 1/2 grams (1-2 clicks) to back of knees every Wednesday, Thursday, Saturday and Sunday.    [provider]  umeclidinium bromide (INCRUSE ELLIPTA) 62.5 MCG/INH AEPB Inhale 1 puff into the lungs daily. 03/21/16   [provider]  vortioxetine HBr (TRINTELLIX) 10 MG TABS tablet Take 10 mg by mouth daily.    [provider]    Family History Family History  Problem Relation Age of Onset  . Heart disease Unknown   . Diabetes Maternal Grandfather   . Heart attack Paternal Grandfather     Social History Social History   Tobacco Use  . Smoking status: Former Smoker    Packs/day: 1.00    Years: 40.00    Pack years: 40.00    Types: Cigarettes    Last attempt to quit: 12/22/2007    Years since quitting: 10.4  . Smokeless tobacco: Never Used  Substance Use Topics  . Alcohol use: Yes    Comment: 07/22/2013 "last drink was in ~ 2005"  . Drug use: No     Allergies   Avelox [moxifloxacin hcl in nacl]; Venlafaxine; Cephalexin; Linezolid; Moxifloxacin; Nitrofurantoin; Other; and Penicillins   Review of Systems Review of Systems  Nosore throat + cough No pleuritic pain + wheezing + nasal congestion + post-nasal drainage No sinus pain/pressure No itchy/red eyes No earache No hemoptysis + SOB No fever/chills No nausea No vomiting No abdominal pain No diarrhea No urinary symptoms No skin rash + fatigue No myalgias No headache    Physical Exam Triage Vital Signs ED Triage Vitals [05/16/18 1206]  Enc Vitals Group     BP 105/63     Pulse Rate 70     Resp (!) 24     Temp 98.6 F (37 C)     Temp  Source Oral     SpO2 (!) 81 %     Weight 252 lb (114.3 kg)     Height      Head Circumference      Peak Flow      Pain Score 0     Pain Loc      Pain Edu?      Excl. in [place]?    No data found.  Updated Vital Signs BP 105/63 (BP Location: Left Arm)   Pulse 76   Temp 98.6 F (37 C) (Oral)   Resp (!) 22   Wt 114.3 kg   SpO2 92% Comment: Post duoneb  BMI 40.67 kg/m   Visual Acuity Right Eye Distance:   Left Eye Distance:   Bilateral Distance:    Right Eye Near:   Left Eye Near:    Bilateral Near:     Physical Exam Nursing notes and Vital Signs reviewed. Appearance:  Patient appears stated age, and in no acute distress Eyes:  Pupils are equal, round, and reactive to light and accomodation.  Extraocular movement is intact.  Conjunctivae are not inflamed  Ears:  Canals normal.  Tympanic membranes normal.  Nose:  Mildly congested turbinates.  No sinus tenderness.   Pharynx:  Normal Neck:  Supple.  Enlarged posterior/lateral nodes are palpated bilaterally, tender to palpation on the left. Lungs:  Distant breath sounds; no rales, scattered wheezes.  Note significant improvement SpO2 after neb treatment with DuoNeb.   Heart:  Regular rate and rhythm without murmurs, rubs, or gallops.  Abdomen:  Nontender without masses or hepatosplenomegaly.  Bowel sounds are present.  No CVA or flank tenderness.  Extremities:  Trace edema.  Skin:  No rash present.    UC Treatments / Results  Labs (all labs ordered are listed, but only  abnormal results are displayed) Labs Reviewed  POCT CBC W AUTO DIFF ([place]):  WBC 14.5; LY 15.2; MO 5.6; GR 79.2; Hgb 10.4; Platelets 246     EKG None  Radiology No results found.  Procedures Procedures (including critical care time)  Medications Ordered in UC Medications  ipratropium-albuterol (DUONEB) 0.5-2.5 (3) MG/3ML nebulizer solution 3 mL (3 mLs Nebulization Given 05/16/18 1222)  methylPREDNISolone acetate (DEPO-MEDROL) injection  80 mg (80 mg Intramuscular Given 05/16/18 1342)    Initial Impression / Assessment and Plan / UC Course  I have reviewed the triage vital signs and the nursing notes.  Pertinent labs & imaging results that were available during my care of the patient were reviewed by me and considered in my medical decision making (see chart for details).    Decrease in WBC (14.5) today reassuring. Administered DuoNeb by hand held nebulizer; patient reports better results than with albuterol. Administered Depo Medrol 80mg  IM.  Rx for DuoNeb for home nebulizer. Followup with PCP as soon as possible.  Recommend regular follow-up with pulmonologist.    Final Clinical Impressions(s) / UC Diagnoses   Final diagnoses:  Multifocal pneumonia  COPD exacerbation ([place])     Discharge Instructions     Take plain guaifenesin (1200mg  extended release tabs such as Mucinex) twice daily, with plenty of water, for cough and congestion.   Get adequate rest.   Continue Biaxin.  Switch to Duoneb for your nebulizer and use as directed. Continue Advair Stop all antihistamines (Zyrtec, Nyquil, etc) for now, and other non-prescription cough/cold preparations.   If symptoms become significantly worse during the night or over the weekend, proceed to the local emergency room.     ED Prescriptions    Medication Sig Dispense Auth. Provider   ipratropium-albuterol (DUONEB) 0.5-2.5 (3) MG/3ML SOLN Take 3 mLs by nebulization every 6 (six) hours as needed. 360 mL Kandra Nicolas, MD        Kandra Nicolas, MD 05/18/18 [phone]

## 2018-05-16 NOTE — ED Triage Notes (Signed)
Patient was seen 2 days ago. She is feeling worse. Cough is non-productive. Increasing SOB. Afebrile. Used nebulizer at home last night.

## 2018-05-25 DIAGNOSIS — J189 Pneumonia, unspecified organism: Secondary | ICD-10-CM | POA: Diagnosis not present

## 2018-05-26 DIAGNOSIS — M47816 Spondylosis without myelopathy or radiculopathy, lumbar region: Secondary | ICD-10-CM | POA: Diagnosis not present

## 2018-05-26 DIAGNOSIS — M75121 Complete rotator cuff tear or rupture of right shoulder, not specified as traumatic: Secondary | ICD-10-CM | POA: Diagnosis not present

## 2018-05-26 DIAGNOSIS — M150 Primary generalized (osteo)arthritis: Secondary | ICD-10-CM | POA: Diagnosis not present

## 2018-05-26 DIAGNOSIS — G894 Chronic pain syndrome: Secondary | ICD-10-CM | POA: Diagnosis not present

## 2018-05-28 DIAGNOSIS — J40 Bronchitis, not specified as acute or chronic: Secondary | ICD-10-CM | POA: Diagnosis not present

## 2018-05-28 DIAGNOSIS — J189 Pneumonia, unspecified organism: Secondary | ICD-10-CM | POA: Diagnosis not present

## 2018-06-15 DIAGNOSIS — Z780 Asymptomatic menopausal state: Secondary | ICD-10-CM | POA: Diagnosis not present

## 2018-06-16 DIAGNOSIS — R911 Solitary pulmonary nodule: Secondary | ICD-10-CM | POA: Diagnosis not present

## 2018-06-16 DIAGNOSIS — Z6838 Body mass index (BMI) 38.0-38.9, adult: Secondary | ICD-10-CM | POA: Diagnosis not present

## 2018-06-16 DIAGNOSIS — J449 Chronic obstructive pulmonary disease, unspecified: Secondary | ICD-10-CM | POA: Diagnosis not present

## 2018-06-16 DIAGNOSIS — J189 Pneumonia, unspecified organism: Secondary | ICD-10-CM | POA: Diagnosis not present

## 2018-06-16 DIAGNOSIS — G4733 Obstructive sleep apnea (adult) (pediatric): Secondary | ICD-10-CM | POA: Diagnosis not present

## 2018-06-16 DIAGNOSIS — J4530 Mild persistent asthma, uncomplicated: Secondary | ICD-10-CM | POA: Diagnosis not present

## 2018-06-16 DIAGNOSIS — Z87891 Personal history of nicotine dependence: Secondary | ICD-10-CM | POA: Diagnosis not present

## 2018-06-16 DIAGNOSIS — E669 Obesity, unspecified: Secondary | ICD-10-CM | POA: Diagnosis not present

## 2018-07-06 DIAGNOSIS — G4733 Obstructive sleep apnea (adult) (pediatric): Secondary | ICD-10-CM | POA: Diagnosis not present

## 2018-07-21 ENCOUNTER — Other Ambulatory Visit: Payer: Self-pay | Admitting: Nurse Practitioner

## 2018-07-21 DIAGNOSIS — M150 Primary generalized (osteo)arthritis: Secondary | ICD-10-CM | POA: Diagnosis not present

## 2018-07-21 DIAGNOSIS — M47816 Spondylosis without myelopathy or radiculopathy, lumbar region: Secondary | ICD-10-CM | POA: Diagnosis not present

## 2018-07-21 DIAGNOSIS — Z1231 Encounter for screening mammogram for malignant neoplasm of breast: Secondary | ICD-10-CM

## 2018-07-21 DIAGNOSIS — G894 Chronic pain syndrome: Secondary | ICD-10-CM | POA: Diagnosis not present

## 2018-07-21 DIAGNOSIS — M75121 Complete rotator cuff tear or rupture of right shoulder, not specified as traumatic: Secondary | ICD-10-CM | POA: Diagnosis not present

## 2018-08-06 DIAGNOSIS — G4733 Obstructive sleep apnea (adult) (pediatric): Secondary | ICD-10-CM | POA: Diagnosis not present

## 2018-08-20 ENCOUNTER — Other Ambulatory Visit: Payer: Self-pay | Admitting: *Deleted

## 2018-08-20 MED ORDER — METOPROLOL TARTRATE 25 MG PO TABS: 25.0000 mg | ORAL_TABLET | Freq: Two times a day (BID) | ORAL | 0 refills | Status: DC

## 2018-08-24 DIAGNOSIS — G4733 Obstructive sleep apnea (adult) (pediatric): Secondary | ICD-10-CM | POA: Diagnosis not present

## 2018-09-04 DIAGNOSIS — G4733 Obstructive sleep apnea (adult) (pediatric): Secondary | ICD-10-CM | POA: Diagnosis not present

## 2018-09-13 DIAGNOSIS — M5432 Sciatica, left side: Secondary | ICD-10-CM | POA: Diagnosis not present

## 2018-09-13 DIAGNOSIS — M150 Primary generalized (osteo)arthritis: Secondary | ICD-10-CM | POA: Diagnosis not present

## 2018-09-13 DIAGNOSIS — M4716 Other spondylosis with myelopathy, lumbar region: Secondary | ICD-10-CM | POA: Diagnosis not present

## 2018-09-13 DIAGNOSIS — Z7982 Long term (current) use of aspirin: Secondary | ICD-10-CM | POA: Diagnosis not present

## 2018-09-13 DIAGNOSIS — K219 Gastro-esophageal reflux disease without esophagitis: Secondary | ICD-10-CM | POA: Diagnosis not present

## 2018-09-13 DIAGNOSIS — S82852A Displaced trimalleolar fracture of left lower leg, initial encounter for closed fracture: Secondary | ICD-10-CM | POA: Diagnosis not present

## 2018-09-13 DIAGNOSIS — I4891 Unspecified atrial fibrillation: Secondary | ICD-10-CM | POA: Diagnosis not present

## 2018-09-13 DIAGNOSIS — E785 Hyperlipidemia, unspecified: Secondary | ICD-10-CM | POA: Diagnosis not present

## 2018-09-13 DIAGNOSIS — Z79899 Other long term (current) drug therapy: Secondary | ICD-10-CM | POA: Diagnosis not present

## 2018-09-13 DIAGNOSIS — I10 Essential (primary) hypertension: Secondary | ICD-10-CM | POA: Diagnosis not present

## 2018-09-13 DIAGNOSIS — M542 Cervicalgia: Secondary | ICD-10-CM | POA: Diagnosis not present

## 2018-09-13 DIAGNOSIS — R52 Pain, unspecified: Secondary | ICD-10-CM | POA: Diagnosis not present

## 2018-09-13 DIAGNOSIS — E119 Type 2 diabetes mellitus without complications: Secondary | ICD-10-CM | POA: Diagnosis not present

## 2018-09-13 DIAGNOSIS — M75121 Complete rotator cuff tear or rupture of right shoulder, not specified as traumatic: Secondary | ICD-10-CM | POA: Diagnosis not present

## 2018-09-13 DIAGNOSIS — Z952 Presence of prosthetic heart valve: Secondary | ICD-10-CM | POA: Diagnosis not present

## 2018-09-13 DIAGNOSIS — H35033 Hypertensive retinopathy, bilateral: Secondary | ICD-10-CM | POA: Diagnosis not present

## 2018-09-13 DIAGNOSIS — M47816 Spondylosis without myelopathy or radiculopathy, lumbar region: Secondary | ICD-10-CM | POA: Diagnosis not present

## 2018-09-13 DIAGNOSIS — Z23 Encounter for immunization: Secondary | ICD-10-CM | POA: Diagnosis not present

## 2018-09-13 DIAGNOSIS — G894 Chronic pain syndrome: Secondary | ICD-10-CM | POA: Diagnosis not present

## 2018-09-13 DIAGNOSIS — G8929 Other chronic pain: Secondary | ICD-10-CM | POA: Diagnosis not present

## 2018-09-13 DIAGNOSIS — H25813 Combined forms of age-related cataract, bilateral: Secondary | ICD-10-CM | POA: Diagnosis not present

## 2018-09-13 DIAGNOSIS — M5442 Lumbago with sciatica, left side: Secondary | ICD-10-CM | POA: Diagnosis not present

## 2018-09-13 DIAGNOSIS — M1612 Unilateral primary osteoarthritis, left hip: Secondary | ICD-10-CM | POA: Diagnosis not present

## 2018-09-13 DIAGNOSIS — M5136 Other intervertebral disc degeneration, lumbar region: Secondary | ICD-10-CM | POA: Diagnosis not present

## 2018-09-13 DIAGNOSIS — I352 Nonrheumatic aortic (valve) stenosis with insufficiency: Secondary | ICD-10-CM | POA: Diagnosis not present

## 2018-09-13 DIAGNOSIS — M25511 Pain in right shoulder: Secondary | ICD-10-CM | POA: Diagnosis not present

## 2018-09-13 DIAGNOSIS — F1190 Opioid use, unspecified, uncomplicated: Secondary | ICD-10-CM | POA: Diagnosis not present

## 2018-09-13 DIAGNOSIS — I712 Thoracic aortic aneurysm, without rupture: Secondary | ICD-10-CM | POA: Diagnosis not present

## 2018-09-13 DIAGNOSIS — Z8249 Family history of ischemic heart disease and other diseases of the circulatory system: Secondary | ICD-10-CM | POA: Diagnosis not present

## 2018-09-17 DIAGNOSIS — E669 Obesity, unspecified: Secondary | ICD-10-CM | POA: Diagnosis not present

## 2018-09-17 DIAGNOSIS — J449 Chronic obstructive pulmonary disease, unspecified: Secondary | ICD-10-CM | POA: Diagnosis not present

## 2018-09-17 DIAGNOSIS — J189 Pneumonia, unspecified organism: Secondary | ICD-10-CM | POA: Diagnosis not present

## 2018-09-17 DIAGNOSIS — J45909 Unspecified asthma, uncomplicated: Secondary | ICD-10-CM | POA: Diagnosis not present

## 2018-09-17 DIAGNOSIS — I5030 Unspecified diastolic (congestive) heart failure: Secondary | ICD-10-CM | POA: Diagnosis not present

## 2018-09-17 DIAGNOSIS — G4733 Obstructive sleep apnea (adult) (pediatric): Secondary | ICD-10-CM | POA: Diagnosis not present

## 2018-09-17 DIAGNOSIS — Z9989 Dependence on other enabling machines and devices: Secondary | ICD-10-CM | POA: Diagnosis not present

## 2018-09-17 DIAGNOSIS — Z7951 Long term (current) use of inhaled steroids: Secondary | ICD-10-CM | POA: Diagnosis not present

## 2018-10-04 ENCOUNTER — Telehealth: Payer: Self-pay

## 2018-10-04 NOTE — Telephone Encounter (Signed)
Patient verbal consent and also consent through [employer] Visit Pre-Appointment Phone Call  "(Name), I am calling you today to discuss your upcoming appointment. We are currently trying to limit exposure to the virus that causes COVID-19 by seeing patients at home rather than in the office."  1. "What is the BEST phone number to call the day of the visit?" - include this in appointment notes  2. "Do you have or have access to (through a family member/friend) a smartphone with video capability that we can use for your visit?" a. If yes - list this number in appt notes as "cell" (if different from BEST phone #) and list the appointment type as a VIDEO visit in appointment notes b. If no - list the appointment type as a PHONE visit in appointment notes  3. Confirm consent - "In the setting of the current Covid19 crisis, you are scheduled for a (phone or video) visit with your provider on (date) at (time).  Just as we do with many in-office visits, in order for you to participate in this visit, we must obtain consent.  If you'd like, I can send this to your mychart (if signed up) or email for you to review.  Otherwise, I can obtain your verbal consent now.  All virtual visits are billed to your insurance company just like a normal visit would be.  By agreeing to a virtual visit, we'd like you to understand that the technology does not allow for your provider to perform an examination, and thus may limit your provider's ability to fully assess your condition. If your provider identifies any concerns that need to be evaluated in person, we will make arrangements to do so.  Finally, though the technology is pretty good, we cannot assure that it will always work on either your or our end, and in the setting of a video visit, we may have to convert it to a phone-only visit.  In either situation, we cannot ensure that we have a secure connection.  Are you willing to proceed?" STAFF: Did the patient  verbally acknowledge consent to telehealth visit? Document YES/NO here: YES  4. Advise patient to be prepared - "Two hours prior to your appointment, go ahead and check your blood pressure, pulse, oxygen saturation, and your weight (if you have the equipment to check those) and write them all down. When your visit starts, your provider will ask you for this information. If you have an Apple Watch or Kardia device, please plan to have heart rate information ready on the day of your appointment. Please have a pen and paper handy nearby the day of the visit as well."  5. Give patient instructions for MyChart download to smartphone OR Doximity/Doxy.me as below if video visit (depending on what platform provider is using)  6. Inform patient they will receive a phone call 15 minutes prior to their appointment time (may be from unknown caller ID) so they should be prepared to answer    TELEPHONE CALL NOTE  Taylor [name] has been deemed a candidate for a follow-up tele-health visit to limit community exposure during the Covid-19 pandemic. I spoke with the patient via phone to ensure availability of phone/video source, confirm preferred email & phone number, and discuss instructions and expectations.  I reminded Taylor [name] to be prepared with any vital sign and/or heart rhythm information that could potentially be obtained via home monitoring, at the time of her visit. I reminded  Taylor [name] to expect a phone call prior to her visit.  Taylor [name], CMA 10/04/2018 4:45 PM   INSTRUCTIONS FOR DOWNLOADING THE MYCHART APP TO SMARTPHONE  - The patient must first make sure to have activated MyChart and know their login information - If Apple, go to [employer] and type in MyChart in the search bar and download the app. If Android, ask patient to go to [employer] and type in [place] in the search bar and download the app. The app is free but as with any other app downloads, their phone may require  them to verify saved payment information or Apple/Android password.  - The patient will need to then log into the app with their MyChart username and password, and select [facility] as their healthcare provider to link the account. When it is time for your visit, go to the MyChart app, find appointments, and click Begin Video Visit. Be sure to Select Allow for your device to access the Microphone and Camera for your visit. You will then be connected, and your provider will be with you shortly.  **If they have any issues connecting, or need assistance please contact MyChart service desk [phone])**  **If using a computer, in order to ensure the best quality for their visit they will need to use either of the following Internet Browsers: [employer], or Google Chrome**  IF USING DOXIMITY or DOXY.ME - The patient will receive a link just prior to their visit by text.     FULL LENGTH CONSENT FOR TELE-HEALTH VISIT   I hereby voluntarily request, consent and authorize [facility] and its employed or contracted physicians, physician assistants, nurse practitioners or other licensed health care professionals (the Practitioner), to provide me with telemedicine health care services (the "Services") as deemed necessary by the treating Practitioner. I acknowledge and consent to receive the Services by the Practitioner via telemedicine. I understand that the telemedicine visit will involve communicating with the Practitioner through live audiovisual communication technology and the disclosure of certain medical information by electronic transmission. I acknowledge that I have been given the opportunity to request an in-person assessment or other available alternative prior to the telemedicine visit and am voluntarily participating in the telemedicine visit.  I understand that I have the right to withhold or withdraw my consent to the use of telemedicine in the course of my care at any  time, without affecting my right to future care or treatment, and that the Practitioner or I may terminate the telemedicine visit at any time. I understand that I have the right to inspect all information obtained and/or recorded in the course of the telemedicine visit and may receive copies of available information for a reasonable fee.  I understand that some of the potential risks of receiving the Services via telemedicine include:  Marland Kitchen Delay or interruption in medical evaluation due to technological equipment failure or disruption; . Information transmitted may not be sufficient (e.g. poor resolution of images) to allow for appropriate medical decision making by the Practitioner; and/or  . In rare instances, security protocols could fail, causing a breach of personal health information.  Furthermore, I acknowledge that it is my responsibility to provide information about my medical history, conditions and care that is complete and accurate to the best of my ability. I acknowledge that Practitioner's advice, recommendations, and/or decision may be based on factors not within their control, such as incomplete or inaccurate data provided by me or distortions of diagnostic  images or specimens that may result from electronic transmissions. I understand that the practice of medicine is not an exact science and that Practitioner makes no warranties or guarantees regarding treatment outcomes. I acknowledge that I will receive a copy of this consent concurrently upon execution via email to the email address I last provided but may also request a printed copy by calling the office of [facility].    I understand that my insurance will be billed for this visit.   I have read or had this consent read to me. . I understand the contents of this consent, which adequately explains the benefits and risks of the Services being provided via telemedicine.  . I have been provided ample opportunity to ask questions  regarding this consent and the Services and have had my questions answered to my satisfaction. . I give my informed consent for the services to be provided through the use of telemedicine in my medical care  By participating in this telemedicine visit I agree to the above.

## 2018-10-05 DIAGNOSIS — G4733 Obstructive sleep apnea (adult) (pediatric): Secondary | ICD-10-CM | POA: Diagnosis not present

## 2018-10-08 ENCOUNTER — Telehealth: Payer: Self-pay | Admitting: Radiology

## 2018-10-08 ENCOUNTER — Other Ambulatory Visit: Payer: Self-pay | Admitting: Internal Medicine

## 2018-10-08 ENCOUNTER — Encounter: Payer: Self-pay | Admitting: Internal Medicine

## 2018-10-08 ENCOUNTER — Other Ambulatory Visit: Payer: Self-pay

## 2018-10-08 ENCOUNTER — Telehealth (INDEPENDENT_AMBULATORY_CARE_PROVIDER_SITE_OTHER): Payer: PPO | Admitting: Internal Medicine

## 2018-10-08 VITALS — Ht 66.0 in | Wt 236.5 lb

## 2018-10-08 DIAGNOSIS — R7309 Other abnormal glucose: Secondary | ICD-10-CM

## 2018-10-08 DIAGNOSIS — I480 Paroxysmal atrial fibrillation: Secondary | ICD-10-CM

## 2018-10-08 DIAGNOSIS — I10 Essential (primary) hypertension: Secondary | ICD-10-CM

## 2018-10-08 DIAGNOSIS — I700 Atherosclerosis of aorta: Secondary | ICD-10-CM

## 2018-10-08 MED ORDER — FLECAINIDE ACETATE 100 MG PO TABS: 100.0000 mg | ORAL_TABLET | Freq: Two times a day (BID) | ORAL | 11 refills | Status: DC

## 2018-10-08 NOTE — Telephone Encounter (Signed)
Enrolled patient for a 30 day long Term monitor to be mailed due to covid-19. I went over brief instructions and patient knows to expect the monitor to arrive in the next 3-4 days

## 2018-10-08 NOTE — Patient Instructions (Addendum)
Medication Instructions:  No changes If you need a refill on your cardiac medications before your next appointment, please call your pharmacy.   Lab work: Bmet, cbc, lipids HgA1c -end of May--scheduled.  If you have labs (blood work) drawn today and your tests are completely normal, you will receive your results only by: Marland Kitchen MyChart Message (if you have MyChart) OR . A paper copy in the mail If you have any lab test that is abnormal or we need to change your treatment, we will call you to review the results.  Testing/Procedures: Your physician has recommended that you wear an event monitor. Event monitors are medical devices that record the heart's electrical activity. Doctors most often [place] these monitors to diagnose arrhythmias. Arrhythmias are problems with the speed or rhythm of the heartbeat. The monitor is a small, portable device. You can wear one while you do your normal daily activities. This is usually used to diagnose what is causing palpitations/syncope (passing out).    Follow-Up: At [facility], you and your health needs are our priority.  As part of our continuing mission to provide you with exceptional heart care, we have created designated Provider Care Teams.  These Care Teams include your primary Cardiologist (physician) and Advanced Practice Providers (APPs -  Physician Assistants and Nurse Practitioners) who all work together to provide you with the care you need, when you need it. You will need a follow up appointment in:  4 months.  Please call our office 2 months in advance to schedule this appointment.  You may see Dr. Harrington Challenger or one of the following Advanced Practice Providers on your designated Care Team: Richardson Dopp, PA-C [place], [place] . Daune Perch, NP  Any Other Special Instructions Will Be Listed Below (If Applicable). I will send a message to billing department to let them know you would like a call in reference to a CPT code to provide to your insurance  company - to help determine your portion of the cost of the monitor.

## 2018-10-08 NOTE — Progress Notes (Signed)
Virtual Visit via Video Note   This visit type was conducted due to national recommendations for restrictions regarding the COVID-19 Pandemic (e.g. social distancing) in an effort to limit this patient's exposure and mitigate transmission in our community.  Due to her co-morbid illnesses, this patient is at least at moderate risk for complications without adequate follow up.  This format is felt to be most appropriate for this patient at this time.  All issues noted in this document were discussed and addressed.  A limited physical exam was performed with this format.  Please refer to the patient's chart for her consent to telehealth for [facility].   Date:  10/08/2018   ID:  Taylor [name], DOB 1950-11-27, MRN [id number]  Patient Location: Home Provider Location: Home  PCP:  [name], PA-C  Cardiologist:  No primary care provider on file.  Electrophysiologist:  None   Evaluation Performed:  Follow-Up Visit  Chief Complaint:  F/u of PAF    History of Present Illness:    Taylor [name] is a 68 y.o. female with hx of PAF, GERD, I saw her last in APril 2019   She also has aortic atherosclerosis on CT scan   Since seen she notes occasional palpitations   Last about 1 hour   Usually at night   Denies dizziness   Breathing is OK   No CP    The patient does not have symptoms concerning for COVID-19 infection (fever, chills, cough, or new shortness of breath).    Past Medical History:  Diagnosis Date  . Anxiety   . Arthritis    "hands, neck, back" (07/22/2013)  . Atrial fibrillation ([place])   . CHEST DISCOMFORT   . Chronic back pain    "started in lower back; getting to be all over my back" (07/22/2013)  . Chronic bronchitis ([place])    "I've had it 2 years in a row; do have some trouble breathing at times; use an inhaler prn; not asthma" (07/22/2013)  . Chronic neck pain   . COPD (chronic obstructive pulmonary disease) ([place])   . DDD (degenerative disc disease), cervical   .  DDD (degenerative disc disease), lumbar   . Depression   . Dysrhythmia   . GERD (gastroesophageal reflux disease)   . Hepatitis 1971   "don't know if it was A or B" (07/22/2013)  . HYPERLIPIDEMIA-MIXED 2005   "went away after I started taking fish oil" (07/22/2013)  . Hypothyroidism   . Palpitations   . Pneumonia 2013  . Type II diabetes mellitus ([place])    "diet controlled" (07/22/2013)   Past Surgical History:  Procedure Laterality Date  . ANTERIOR CERVICAL DECOMP/DISCECTOMY FUSION  ?2003   "w/plating" (07/22/2013)  . BUNIONECTOMY Left 2013  . DILATION AND CURETTAGE OF UTERUS  1976  . PLANTAR FASCIA SURGERY Right 2011     Current Meds  Medication Sig  . acetaminophen (TYLENOL) 500 MG tablet Take 500 mg by mouth every 6 (six) hours as needed for headache.  . albuterol (PROVENTIL HFA;VENTOLIN HFA) 108 (90 BASE) MCG/ACT inhaler Inhale 1 puff into the lungs every 6 (six) hours as needed for wheezing or shortness of breath.  . Alpha-Lipoic Acid 100 MG CAPS Take by mouth.  . ALPRAZolam (XANAX) 0.25 MG tablet Take 0.25 mg by mouth 2 (two) times daily as needed for anxiety.   . Ascorbic Acid (VITAMIN C) 1000 MG tablet Take by mouth.  Marland Kitchen aspirin EC 81 MG tablet Take 81 mg  by mouth daily.  Marland Kitchen buPROPion (WELLBUTRIN XL) 150 MG 24 hr tablet Take 150 mg by mouth daily.   . cetirizine (ZYRTEC) 10 MG tablet Take by mouth.  . chlorhexidine (PERIDEX) 0.12 % solution SWISH WITH 15 ML'S FOR 2 MINUTES, THEN SPIT TWICE A DAY  . Cholecalciferol (VITAMIN D3) 5000 UNITS CAPS Take 5,000 mg by mouth daily.  Marland Kitchen CINNAMON PO Take 1 tablet by mouth daily.  Marland Kitchen desonide (DESOWEN) 0.05 % cream Apply topically.  Marland Kitchen DHEA 25 MG CAPS Take 25 mg by mouth daily.  Marland Kitchen escitalopram (LEXAPRO) 10 MG tablet Take by mouth.  . ESTRADIOL TD Place 1 mL onto the skin 2 (two) times daily. Estradiol 4.5mg /gm cream  . flecainide (TAMBOCOR) 100 MG tablet TAKE 1 TABLET BY MOUTH TWICE A DAY  . Fluticasone-Salmeterol (ADVAIR) 500-50 MCG/DOSE  AEPB Inhale into the lungs.  . furosemide (LASIX) 40 MG tablet Take 80 mg by mouth daily.  Marland Kitchen guaiFENesin (MUCINEX) 600 MG 12 hr tablet Take by mouth.  . hydrOXYzine (ATARAX/VISTARIL) 50 MG tablet Take by mouth.  Marland Kitchen ketoconazole (NIZORAL) 2 % cream ketoconazole 2 % topical cream  . ketoconazole (NIZORAL) 2 % shampoo ketoconazole 2 % shampoo  . KRILL OIL PO Take 1 tablet by mouth daily.  Marland Kitchen lidocaine (XYLOCAINE) 2 % solution Lidocaine Viscous 2 % mucosal solution  . metoprolol tartrate (LOPRESSOR) 25 MG tablet Take 1 tablet (25 mg total) by mouth 2 (two) times daily.  . montelukast (SINGULAIR) 10 MG tablet Take 10 mg by mouth at bedtime.  Marland Kitchen NATURE-THROID 97.5 MG TABS Take 97.5 mg by mouth daily.   . OIL OF OREGANO PO Take 1 capsule by mouth daily.  . Omega-3 1000 MG CAPS Take by mouth.  Marland Kitchen omeprazole (PRILOSEC) 20 MG capsule Take by mouth.  . oxyCODONE (ROXICODONE) 15 MG immediate release tablet take 4 times daily  . potassium chloride SA (K-DUR,KLOR-CON) 20 MEQ tablet Take 20 mEq by mouth daily. Always takes with furosemide  . pregabalin (LYRICA) 75 MG capsule Take by mouth.  . Pregnenolone Micronized POWD Take 100 mg by mouth daily.   . progesterone (PROMETRIUM) 100 MG capsule Take 100 mg by mouth Daily.   . promethazine (PHENERGAN) 25 MG tablet Take 25 mg by mouth every 6 (six) hours as needed for nausea.   . pseudoephedrine (SUDAFED) 120 MG 12 hr tablet Take 120 mg by mouth every 12 (twelve) hours as needed for congestion.   . Sodium Fluoride (PREVIDENT 5000 BOOSTER DT) Place 1 application onto teeth daily.   . TESTOSTERONE TD Place 1 application onto the skin 4 (four) times a week. Testosterone 2% cream.  Apply 1/4 to 1/2 grams (1-2 clicks) to back of knees every Wednesday, Thursday, Saturday and Sunday.  Marland Kitchen tiZANidine (ZANAFLEX) 4 MG tablet Take 2 mg by mouth as needed for muscle spasms.  . Turmeric 400 MG CAPS Take by mouth.  Marland Kitchen VITAMIN A PO Take by mouth.  Marland Kitchen VITAMIN K PO Take by mouth.  .  [DISCONTINUED] 5-Methyltetrahydrofolate (METHYL FOLATE) POWD Take 1 capsule by mouth daily.      Allergies:   Avelox [moxifloxacin hcl in nacl]; Venlafaxine; Cephalexin; Linezolid; Moxifloxacin; Nitrofurantoin; Other; and Penicillins   Social History   Tobacco Use  . Smoking status: Former Smoker    Packs/day: 1.00    Years: 40.00    Pack years: 40.00    Types: Cigarettes    Last attempt to quit: 12/22/2007    Years since quitting: 10.8  .  Smokeless tobacco: Never Used  Substance Use Topics  . Alcohol use: Yes    Comment: 07/22/2013 "last drink was in ~ 2005"  . Drug use: No     Family Hx: The patient's family history includes Diabetes in her maternal grandfather; Heart attack in her paternal grandfather; Heart disease in her unknown relative.  ROS:   Please see the history of present illness.    All other systems reviewed and are negative.   Prior CV studies:   The following studies were reviewed today:  Labs/Other Tests and Data Reviewed:    EKG:  Recent Labs: No results found for requested labs within last 8760 hours.   Recent Lipid Panel Lab Results  Component Value Date/Time   CHOL 166 11/17/2007 08:44 AM   TRIG 92 11/17/2007 08:44 AM   HDL 39.9 11/17/2007 08:44 AM   CHOLHDL 4.2 CALC 11/17/2007 08:44 AM   LDLCALC 108 (H) 11/17/2007 08:44 AM    Wt Readings from Last 3 Encounters:  10/08/18 236 lb 8 oz (107.3 kg)  05/16/18 252 lb (114.3 kg)  05/14/18 250 lb (113.4 kg)     Objective:    Vital Signs:  Ht 5\' 6"  (1.676 m)   Wt 236 lb 8 oz (107.3 kg)   BMI 38.17 kg/m    Exam not done due to televisit   BP not done per patient  ASSESSMENT & PLAN:    1   PAF   Need to capture   WIll set up for event monitor Check labs   Pt's CHADS VASC now is 2   Discussed with her what this means   Will try to capture afib     2  Atherosclerosis   No symtpoms to sugg angina   CHeck labs     3  COVID-19 Education: The signs and symptoms of COVID-19 were discussed  with the patient and how to seek care for testing (follow up with PCP or arrange E-visit).  The importance of social distancing was discussed today.  Time:   Today, I have spent 25 minutes with the patient with telehealth technology discussing the above problems.     Medication Adjustments/Labs and Tests Ordered: Current medicines are reviewed at length with the patient today.  Concerns regarding medicines are outlined above.   Tests Ordered: No orders of the defined types were placed in this encounter.   Medication Changes: No orders of the defined types were placed in this encounter.   Disposition:  Follow up September Signed, Tinesha Siegrist, MD  10/08/2018 9:35 AM    [place]

## 2018-10-13 ENCOUNTER — Ambulatory Visit (INDEPENDENT_AMBULATORY_CARE_PROVIDER_SITE_OTHER): Payer: PPO

## 2018-10-13 DIAGNOSIS — I480 Paroxysmal atrial fibrillation: Secondary | ICD-10-CM | POA: Diagnosis not present

## 2018-10-13 DIAGNOSIS — I10 Essential (primary) hypertension: Secondary | ICD-10-CM | POA: Diagnosis not present

## 2018-11-04 DIAGNOSIS — G4733 Obstructive sleep apnea (adult) (pediatric): Secondary | ICD-10-CM | POA: Diagnosis not present

## 2018-11-05 ENCOUNTER — Other Ambulatory Visit: Payer: PPO

## 2018-11-08 ENCOUNTER — Other Ambulatory Visit: Payer: Self-pay

## 2018-11-08 ENCOUNTER — Other Ambulatory Visit: Payer: PPO | Admitting: *Deleted

## 2018-11-08 DIAGNOSIS — I10 Essential (primary) hypertension: Secondary | ICD-10-CM | POA: Diagnosis not present

## 2018-11-08 DIAGNOSIS — G894 Chronic pain syndrome: Secondary | ICD-10-CM | POA: Diagnosis not present

## 2018-11-08 DIAGNOSIS — I480 Paroxysmal atrial fibrillation: Secondary | ICD-10-CM | POA: Diagnosis not present

## 2018-11-08 DIAGNOSIS — I700 Atherosclerosis of aorta: Secondary | ICD-10-CM

## 2018-11-08 DIAGNOSIS — M47816 Spondylosis without myelopathy or radiculopathy, lumbar region: Secondary | ICD-10-CM | POA: Diagnosis not present

## 2018-11-08 DIAGNOSIS — R7309 Other abnormal glucose: Secondary | ICD-10-CM

## 2018-11-08 DIAGNOSIS — M75121 Complete rotator cuff tear or rupture of right shoulder, not specified as traumatic: Secondary | ICD-10-CM | POA: Diagnosis not present

## 2018-11-08 DIAGNOSIS — M150 Primary generalized (osteo)arthritis: Secondary | ICD-10-CM | POA: Diagnosis not present

## 2018-11-09 LAB — LIPID PANEL
Chol/HDL Ratio: 3.8 ratio (ref 0.0–4.4)
Cholesterol, Total: 188 mg/dL (ref 100–199)
HDL: 49 mg/dL (ref >39)
LDL Calculated: 104 mg/dL — ABNORMAL HIGH (ref 0–99)
Triglycerides: 173 mg/dL — ABNORMAL HIGH (ref 0–149)
VLDL Cholesterol Cal: 35 mg/dL (ref 5–40)

## 2018-11-09 LAB — HEMOGLOBIN A1C
Est. average glucose Bld gHb Est-mCnc: 126 mg/dL
Hgb A1c MFr Bld: 6 % — ABNORMAL HIGH (ref 4.8–5.6)

## 2018-11-09 LAB — BASIC METABOLIC PANEL
BUN/Creatinine Ratio: 13 (ref 12–28)
BUN: 15 mg/dL (ref 8–27)
CO2: 25 mmol/L (ref 20–29)
Calcium: 9.3 mg/dL (ref 8.7–10.3)
Chloride: 98 mmol/L (ref 96–106)
Creatinine, Ser: 1.2 mg/dL — ABNORMAL HIGH (ref 0.57–1.00)
GFR calc Af Amer: 55 mL/min/{1.73_m2} — ABNORMAL LOW (ref >59)
GFR calc non Af Amer: 48 mL/min/{1.73_m2} — ABNORMAL LOW (ref >59)
Glucose: 88 mg/dL (ref 65–99)
Potassium: 4.7 mmol/L (ref 3.5–5.2)
Sodium: 138 mmol/L (ref 134–144)

## 2018-11-09 LAB — CBC
Hematocrit: 41 % (ref 34.0–46.6)
Hemoglobin: 14 g/dL (ref 11.1–15.9)
MCH: 29.4 pg (ref 26.6–33.0)
MCHC: 34.1 g/dL (ref 31.5–35.7)
MCV: 86 fL (ref 79–97)
Platelets: 306 10*3/uL (ref 150–450)
RBC: 4.77 x10E6/uL (ref 3.77–5.28)
RDW: 13.1 % (ref 11.7–15.4)
WBC: 10.1 10*3/uL (ref 3.4–10.8)

## 2018-11-12 ENCOUNTER — Telehealth: Payer: Self-pay

## 2018-11-12 DIAGNOSIS — I480 Paroxysmal atrial fibrillation: Secondary | ICD-10-CM

## 2018-11-12 DIAGNOSIS — Z79899 Other long term (current) drug therapy: Secondary | ICD-10-CM

## 2018-11-12 DIAGNOSIS — E860 Dehydration: Secondary | ICD-10-CM

## 2018-11-12 NOTE — Telephone Encounter (Signed)
Pt advised and will return for BMET 12/13/18

## 2018-11-12 NOTE — Telephone Encounter (Signed)
-----   Message from Fay Records, MD sent at 11/11/2018  4:30 PM EDT ----- Hgb A1C is OK at 6

## 2018-11-19 ENCOUNTER — Other Ambulatory Visit: Payer: Self-pay | Admitting: Internal Medicine

## 2018-11-19 MED ORDER — METOPROLOL TARTRATE 25 MG PO TABS: 25.0000 mg | ORAL_TABLET | Freq: Two times a day (BID) | ORAL | 3 refills | Status: DC

## 2018-11-22 ENCOUNTER — Other Ambulatory Visit: Payer: Self-pay

## 2018-11-26 DIAGNOSIS — G4733 Obstructive sleep apnea (adult) (pediatric): Secondary | ICD-10-CM | POA: Diagnosis not present

## 2018-11-29 DIAGNOSIS — M79672 Pain in left foot: Secondary | ICD-10-CM | POA: Diagnosis not present

## 2018-11-29 DIAGNOSIS — R7303 Prediabetes: Secondary | ICD-10-CM | POA: Diagnosis not present

## 2018-11-29 DIAGNOSIS — M2011 Hallux valgus (acquired), right foot: Secondary | ICD-10-CM | POA: Diagnosis not present

## 2018-11-29 DIAGNOSIS — L84 Corns and callosities: Secondary | ICD-10-CM | POA: Diagnosis not present

## 2018-12-05 DIAGNOSIS — G4733 Obstructive sleep apnea (adult) (pediatric): Secondary | ICD-10-CM | POA: Diagnosis not present

## 2018-12-08 ENCOUNTER — Encounter: Payer: Self-pay | Admitting: Gastroenterology

## 2018-12-13 ENCOUNTER — Other Ambulatory Visit: Payer: Self-pay

## 2018-12-13 ENCOUNTER — Other Ambulatory Visit: Payer: PPO | Admitting: *Deleted

## 2018-12-13 DIAGNOSIS — M2011 Hallux valgus (acquired), right foot: Secondary | ICD-10-CM | POA: Diagnosis not present

## 2018-12-13 DIAGNOSIS — Z79899 Other long term (current) drug therapy: Secondary | ICD-10-CM | POA: Diagnosis not present

## 2018-12-13 DIAGNOSIS — L84 Corns and callosities: Secondary | ICD-10-CM | POA: Diagnosis not present

## 2018-12-13 DIAGNOSIS — I480 Paroxysmal atrial fibrillation: Secondary | ICD-10-CM

## 2018-12-13 DIAGNOSIS — E860 Dehydration: Secondary | ICD-10-CM

## 2018-12-13 DIAGNOSIS — M79672 Pain in left foot: Secondary | ICD-10-CM | POA: Diagnosis not present

## 2018-12-13 DIAGNOSIS — R7303 Prediabetes: Secondary | ICD-10-CM | POA: Diagnosis not present

## 2018-12-14 ENCOUNTER — Telehealth: Payer: Self-pay | Admitting: *Deleted

## 2018-12-14 DIAGNOSIS — R609 Edema, unspecified: Secondary | ICD-10-CM

## 2018-12-14 DIAGNOSIS — I10 Essential (primary) hypertension: Secondary | ICD-10-CM

## 2018-12-14 LAB — BASIC METABOLIC PANEL
BUN/Creatinine Ratio: 11 — ABNORMAL LOW (ref 12–28)
BUN: 13 mg/dL (ref 8–27)
CO2: 25 mmol/L (ref 20–29)
Calcium: 9.2 mg/dL (ref 8.7–10.3)
Chloride: 98 mmol/L (ref 96–106)
Creatinine, Ser: 1.2 mg/dL — ABNORMAL HIGH (ref 0.57–1.00)
GFR calc Af Amer: 55 mL/min/{1.73_m2} — ABNORMAL LOW (ref >59)
GFR calc non Af Amer: 48 mL/min/{1.73_m2} — ABNORMAL LOW (ref >59)
Glucose: 103 mg/dL — ABNORMAL HIGH (ref 65–99)
Potassium: 4.2 mmol/L (ref 3.5–5.2)
Sodium: 140 mmol/L (ref 134–144)

## 2018-12-14 MED ORDER — FUROSEMIDE 40 MG PO TABS: 40.0000 mg | ORAL_TABLET | Freq: Every day | ORAL | 3 refills | Status: DC

## 2018-12-14 MED ORDER — POTASSIUM CHLORIDE CRYS ER 10 MEQ PO TBCR: 10.0000 meq | EXTENDED_RELEASE_TABLET | Freq: Every day | ORAL | 3 refills | Status: AC

## 2018-12-14 NOTE — Telephone Encounter (Signed)
-----   Message from Dorris Carnes V, MD sent at 12/14/2018  3:42 PM EDT ----- COnfirm meds   Cut lasix in 1/2 to 40 and KCL to 10 F/U BMET in 3 wks  With BNP

## 2018-12-14 NOTE — Telephone Encounter (Signed)
Called and spoke with pt re: lab results. She will change lasix to 40 mg daily and potassium to 10 meq daily. Lab appointment for 01/03/19.

## 2018-12-27 DIAGNOSIS — M79672 Pain in left foot: Secondary | ICD-10-CM | POA: Diagnosis not present

## 2018-12-27 DIAGNOSIS — L84 Corns and callosities: Secondary | ICD-10-CM | POA: Diagnosis not present

## 2018-12-27 DIAGNOSIS — R7303 Prediabetes: Secondary | ICD-10-CM | POA: Diagnosis not present

## 2018-12-27 DIAGNOSIS — M2011 Hallux valgus (acquired), right foot: Secondary | ICD-10-CM | POA: Diagnosis not present

## 2018-12-29 DIAGNOSIS — G4733 Obstructive sleep apnea (adult) (pediatric): Secondary | ICD-10-CM | POA: Diagnosis not present

## 2018-12-30 DIAGNOSIS — M75121 Complete rotator cuff tear or rupture of right shoulder, not specified as traumatic: Secondary | ICD-10-CM | POA: Diagnosis not present

## 2018-12-30 DIAGNOSIS — M150 Primary generalized (osteo)arthritis: Secondary | ICD-10-CM | POA: Diagnosis not present

## 2018-12-30 DIAGNOSIS — G894 Chronic pain syndrome: Secondary | ICD-10-CM | POA: Diagnosis not present

## 2018-12-30 DIAGNOSIS — M47816 Spondylosis without myelopathy or radiculopathy, lumbar region: Secondary | ICD-10-CM | POA: Diagnosis not present

## 2019-01-03 ENCOUNTER — Other Ambulatory Visit: Payer: PPO

## 2019-01-04 DIAGNOSIS — G4733 Obstructive sleep apnea (adult) (pediatric): Secondary | ICD-10-CM | POA: Diagnosis not present

## 2019-01-07 DIAGNOSIS — Z20828 Contact with and (suspected) exposure to other viral communicable diseases: Secondary | ICD-10-CM | POA: Diagnosis not present

## 2019-01-10 ENCOUNTER — Other Ambulatory Visit: Payer: PPO | Admitting: *Deleted

## 2019-01-10 ENCOUNTER — Other Ambulatory Visit: Payer: Self-pay

## 2019-01-10 DIAGNOSIS — R609 Edema, unspecified: Secondary | ICD-10-CM

## 2019-01-10 DIAGNOSIS — M2011 Hallux valgus (acquired), right foot: Secondary | ICD-10-CM | POA: Diagnosis not present

## 2019-01-10 DIAGNOSIS — I10 Essential (primary) hypertension: Secondary | ICD-10-CM

## 2019-01-10 DIAGNOSIS — M79672 Pain in left foot: Secondary | ICD-10-CM | POA: Diagnosis not present

## 2019-01-10 DIAGNOSIS — R7303 Prediabetes: Secondary | ICD-10-CM | POA: Diagnosis not present

## 2019-01-10 DIAGNOSIS — L84 Corns and callosities: Secondary | ICD-10-CM | POA: Diagnosis not present

## 2019-01-11 LAB — BASIC METABOLIC PANEL
BUN/Creatinine Ratio: 15 (ref 12–28)
BUN: 17 mg/dL (ref 8–27)
CO2: 28 mmol/L (ref 20–29)
Calcium: 9.2 mg/dL (ref 8.7–10.3)
Chloride: 95 mmol/L — ABNORMAL LOW (ref 96–106)
Creatinine, Ser: 1.14 mg/dL — ABNORMAL HIGH (ref 0.57–1.00)
GFR calc Af Amer: 58 mL/min/{1.73_m2} — ABNORMAL LOW (ref >59)
GFR calc non Af Amer: 51 mL/min/{1.73_m2} — ABNORMAL LOW (ref >59)
Glucose: 91 mg/dL (ref 65–99)
Potassium: 4.5 mmol/L (ref 3.5–5.2)
Sodium: 139 mmol/L (ref 134–144)

## 2019-01-11 LAB — PRO B NATRIURETIC PEPTIDE: NT-Pro BNP: 290 pg/mL (ref 0–301)

## 2019-01-12 ENCOUNTER — Encounter: Payer: Self-pay | Admitting: Gastroenterology

## 2019-01-17 NOTE — Telephone Encounter (Signed)
The patient has been notified of the lab results and verbalized understanding.  All questions (if any) were answered. Wilma Flavin, RN 01/17/2019 4:38 PM

## 2019-01-28 DIAGNOSIS — G4733 Obstructive sleep apnea (adult) (pediatric): Secondary | ICD-10-CM | POA: Diagnosis not present

## 2019-02-04 DIAGNOSIS — G4733 Obstructive sleep apnea (adult) (pediatric): Secondary | ICD-10-CM | POA: Diagnosis not present

## 2019-02-09 ENCOUNTER — Encounter: Payer: PPO | Admitting: Gastroenterology

## 2019-02-17 ENCOUNTER — Ambulatory Visit: Payer: PPO | Admitting: Internal Medicine

## 2019-02-21 DIAGNOSIS — M2011 Hallux valgus (acquired), right foot: Secondary | ICD-10-CM | POA: Diagnosis not present

## 2019-02-21 DIAGNOSIS — L84 Corns and callosities: Secondary | ICD-10-CM | POA: Diagnosis not present

## 2019-02-21 DIAGNOSIS — R7303 Prediabetes: Secondary | ICD-10-CM | POA: Diagnosis not present

## 2019-02-21 DIAGNOSIS — M79672 Pain in left foot: Secondary | ICD-10-CM | POA: Diagnosis not present

## 2019-03-03 DIAGNOSIS — M47816 Spondylosis without myelopathy or radiculopathy, lumbar region: Secondary | ICD-10-CM | POA: Diagnosis not present

## 2019-03-03 DIAGNOSIS — G894 Chronic pain syndrome: Secondary | ICD-10-CM | POA: Diagnosis not present

## 2019-03-03 DIAGNOSIS — M150 Primary generalized (osteo)arthritis: Secondary | ICD-10-CM | POA: Diagnosis not present

## 2019-03-03 DIAGNOSIS — M75121 Complete rotator cuff tear or rupture of right shoulder, not specified as traumatic: Secondary | ICD-10-CM | POA: Diagnosis not present

## 2019-03-07 DIAGNOSIS — L84 Corns and callosities: Secondary | ICD-10-CM | POA: Diagnosis not present

## 2019-03-07 DIAGNOSIS — R7303 Prediabetes: Secondary | ICD-10-CM | POA: Diagnosis not present

## 2019-03-07 DIAGNOSIS — M2011 Hallux valgus (acquired), right foot: Secondary | ICD-10-CM | POA: Diagnosis not present

## 2019-03-07 DIAGNOSIS — M79672 Pain in left foot: Secondary | ICD-10-CM | POA: Diagnosis not present

## 2019-03-07 DIAGNOSIS — G4733 Obstructive sleep apnea (adult) (pediatric): Secondary | ICD-10-CM | POA: Diagnosis not present

## 2019-03-30 DIAGNOSIS — I10 Essential (primary) hypertension: Secondary | ICD-10-CM

## 2019-03-30 DIAGNOSIS — R609 Edema, unspecified: Secondary | ICD-10-CM

## 2019-03-31 NOTE — Telephone Encounter (Signed)
Get BMET and BNP prior to changing  Change to 80   F/U BMET and BNP in 10 to 14 days

## 2019-04-04 NOTE — Telephone Encounter (Signed)
Called patient and informed of recommendations. She will come tomorrow for lab work and is aware will need to repeat labs 10-14 days after dose increase.

## 2019-04-05 ENCOUNTER — Other Ambulatory Visit: Payer: Self-pay

## 2019-04-05 ENCOUNTER — Other Ambulatory Visit: Payer: PPO

## 2019-04-05 DIAGNOSIS — R609 Edema, unspecified: Secondary | ICD-10-CM | POA: Diagnosis not present

## 2019-04-05 DIAGNOSIS — I10 Essential (primary) hypertension: Secondary | ICD-10-CM

## 2019-04-06 DIAGNOSIS — G4733 Obstructive sleep apnea (adult) (pediatric): Secondary | ICD-10-CM | POA: Diagnosis not present

## 2019-04-06 LAB — BASIC METABOLIC PANEL
BUN/Creatinine Ratio: 14 (ref 12–28)
BUN: 14 mg/dL (ref 8–27)
CO2: 25 mmol/L (ref 20–29)
Calcium: 9.2 mg/dL (ref 8.7–10.3)
Chloride: 99 mmol/L (ref 96–106)
Creatinine, Ser: 1.01 mg/dL — ABNORMAL HIGH (ref 0.57–1.00)
GFR calc Af Amer: 68 mL/min/{1.73_m2} (ref >59)
GFR calc non Af Amer: 59 mL/min/{1.73_m2} — ABNORMAL LOW (ref >59)
Glucose: 122 mg/dL — ABNORMAL HIGH (ref 65–99)
Potassium: 4.7 mmol/L (ref 3.5–5.2)
Sodium: 138 mmol/L (ref 134–144)

## 2019-04-06 LAB — PRO B NATRIURETIC PEPTIDE: NT-Pro BNP: 458 pg/mL — ABNORMAL HIGH (ref 0–301)

## 2019-04-07 ENCOUNTER — Telehealth: Payer: Self-pay

## 2019-04-07 DIAGNOSIS — Z79899 Other long term (current) drug therapy: Secondary | ICD-10-CM

## 2019-04-07 DIAGNOSIS — R609 Edema, unspecified: Secondary | ICD-10-CM

## 2019-04-07 MED ORDER — FUROSEMIDE 80 MG PO TABS: 80.0000 mg | ORAL_TABLET | Freq: Every day | ORAL | 3 refills | Status: DC

## 2019-04-07 NOTE — Telephone Encounter (Signed)
The patient has been notified of the result and verbalized understanding. Patient will begin taking Lasix 80mg  daily. Repeat labs are scheduled for 04/22/19. All questions (if any) were answered. Antonieta Iba, RN 04/07/2019 11:32 AM

## 2019-04-07 NOTE — Telephone Encounter (Signed)
-----   Message from [place], MD sent at 04/06/2019  4:04 PM EDT ----- Electrolytesa and kidney function are OK   FLuid is up a little    Treat with 80 lasix daily    F?U BMET and BNP in 2 wks

## 2019-04-07 NOTE — Telephone Encounter (Signed)
-----   Message from [place], MD sent at 04/06/2019  4:04 PM EDT ----- Electrolytesa and kidney function are OK   FLuid is up a little    Treat with 80 lasix daily    F?U BMET and BNP in 2 wks

## 2019-04-22 ENCOUNTER — Other Ambulatory Visit: Payer: Self-pay

## 2019-04-22 ENCOUNTER — Other Ambulatory Visit: Payer: PPO | Admitting: *Deleted

## 2019-04-22 DIAGNOSIS — Z79899 Other long term (current) drug therapy: Secondary | ICD-10-CM | POA: Diagnosis not present

## 2019-04-23 LAB — BASIC METABOLIC PANEL
BUN/Creatinine Ratio: 15 (ref 12–28)
BUN: 16 mg/dL (ref 8–27)
CO2: 26 mmol/L (ref 20–29)
Calcium: 9 mg/dL (ref 8.7–10.3)
Chloride: 101 mmol/L (ref 96–106)
Creatinine, Ser: 1.07 mg/dL — ABNORMAL HIGH (ref 0.57–1.00)
GFR calc Af Amer: 63 mL/min/{1.73_m2} (ref >59)
GFR calc non Af Amer: 55 mL/min/{1.73_m2} — ABNORMAL LOW (ref >59)
Glucose: 117 mg/dL — ABNORMAL HIGH (ref 65–99)
Potassium: 4.3 mmol/L (ref 3.5–5.2)
Sodium: 140 mmol/L (ref 134–144)

## 2019-04-23 LAB — PRO B NATRIURETIC PEPTIDE: NT-Pro BNP: 397 pg/mL — ABNORMAL HIGH (ref 0–301)

## 2019-04-24 NOTE — Progress Notes (Signed)
Cardiology Office Note   Date:  04/25/2019   ID:  KATRIANNA GORKA, DOB 1950/10/03, MRN [id number]  PCP:  [name], PA-C  Cardiologist:   Dorris Carnes, MD   F/U of PAF    History of Present Illness: Taylor [name] is a 68 y.o. female with a history of PAF, GERD, hypothyroidsm   Afib initially presented in 2009  Possibly related to URI   Her last episodes of atrial fib were when she had pneumonia in 2014  CT in 2015 showed scattered plaquing of aorta     I saw the pt in 2019    SInce seen she denies palpitations    Breathing is OK     Current Meds  Medication Sig  . acetaminophen (TYLENOL) 500 MG tablet Take 500 mg by mouth every 6 (six) hours as needed for headache.  . albuterol (PROVENTIL HFA;VENTOLIN HFA) 108 (90 BASE) MCG/ACT inhaler Inhale 1 puff into the lungs every 6 (six) hours as needed for wheezing or shortness of breath.  . Alpha-Lipoic Acid 100 MG CAPS Take by mouth.  . ALPRAZolam (XANAX) 0.25 MG tablet Take 0.25 mg by mouth 2 (two) times daily as needed for anxiety.   . Ascorbic Acid (VITAMIN C) 1000 MG tablet Take by mouth.  Marland Kitchen aspirin EC 81 MG tablet Take 81 mg by mouth daily.  Marland Kitchen buPROPion (WELLBUTRIN XL) 150 MG 24 hr tablet Take 150 mg by mouth daily.   . cetirizine (ZYRTEC) 10 MG tablet Take by mouth.  . chlorhexidine (PERIDEX) 0.12 % solution SWISH WITH 15 ML'S FOR 2 MINUTES, THEN SPIT TWICE A DAY  . Cholecalciferol (VITAMIN D3) 5000 UNITS CAPS Take 5,000 mg by mouth daily.  Marland Kitchen CINNAMON PO Take 1 tablet by mouth daily.  Marland Kitchen desonide (DESOWEN) 0.05 % cream Apply topically.  Marland Kitchen DHEA 25 MG CAPS Take 25 mg by mouth daily.  Marland Kitchen ESTRADIOL TD Place 1 mL onto the skin 2 (two) times daily. Estradiol 4.5mg /gm cream  . flecainide (TAMBOCOR) 100 MG tablet Take 1 tablet (100 mg total) by mouth 2 (two) times daily.  . Fluticasone-Salmeterol (ADVAIR) 500-50 MCG/DOSE AEPB Inhale into the lungs.  . furosemide (LASIX) 80 MG tablet Take 1 tablet (80 mg total) by mouth daily.   Marland Kitchen guaiFENesin (MUCINEX) 600 MG 12 hr tablet Take by mouth.  . hydrOXYzine (ATARAX/VISTARIL) 50 MG tablet Take by mouth.  Marland Kitchen ketoconazole (NIZORAL) 2 % cream ketoconazole 2 % topical cream  . ketoconazole (NIZORAL) 2 % shampoo ketoconazole 2 % shampoo  . metoprolol tartrate (LOPRESSOR) 25 MG tablet Take 1 tablet (25 mg total) by mouth 2 (two) times daily.  . montelukast (SINGULAIR) 10 MG tablet Take 10 mg by mouth at bedtime.  Marland Kitchen NATURE-THROID 97.5 MG TABS Take 97.5 mg by mouth daily.   . OIL OF OREGANO PO Take 1 capsule by mouth daily.  Marland Kitchen omeprazole (PRILOSEC) 20 MG capsule Take by mouth.  . oxyCODONE (ROXICODONE) 15 MG immediate release tablet take 4 times daily  . potassium chloride (KLOR-CON M10) 10 MEQ tablet Take 1 tablet (10 mEq total) by mouth daily.  . pregabalin (LYRICA) 75 MG capsule Take by mouth.  . Pregnenolone Micronized POWD Take 100 mg by mouth daily.   . progesterone (PROMETRIUM) 100 MG capsule Take 100 mg by mouth Daily.   . promethazine (PHENERGAN) 25 MG tablet Take 25 mg by mouth every 6 (six) hours as needed for nausea.   . pseudoephedrine (SUDAFED) 120 MG 12 hr  tablet Take 120 mg by mouth every 12 (twelve) hours as needed for congestion.   . Sodium Fluoride (PREVIDENT 5000 BOOSTER DT) Place 1 application onto teeth daily.   . TESTOSTERONE TD Place 1 application onto the skin 4 (four) times a week. Testosterone 2% cream.  Apply 1/4 to 1/2 grams (1-2 clicks) to back of knees every Wednesday, Thursday, Saturday and Sunday.  Marland Kitchen tiZANidine (ZANAFLEX) 4 MG tablet Take 2 mg by mouth as needed for muscle spasms.  . Turmeric 400 MG CAPS Take by mouth.  Marland Kitchen VITAMIN A PO Take by mouth.  Marland Kitchen VITAMIN K PO Take by mouth.     Allergies:   Avelox [moxifloxacin hcl in nacl], Linezolid, Penicillin g, Venlafaxine, Statins, Cephalexin, Moxifloxacin, Nitrofurantoin, Other, and Penicillins   Past Medical History:  Diagnosis Date  . Anxiety   . Arthritis    "hands, neck, back" (07/22/2013)   . Atrial fibrillation ([place])   . CHEST DISCOMFORT   . Chronic back pain    "started in lower back; getting to be all over my back" (07/22/2013)  . Chronic bronchitis ([place])    "I've had it 2 years in a row; do have some trouble breathing at times; use an inhaler prn; not asthma" (07/22/2013)  . Chronic neck pain   . COPD (chronic obstructive pulmonary disease) ([place])   . DDD (degenerative disc disease), cervical   . DDD (degenerative disc disease), lumbar   . Depression   . Dysrhythmia   . GERD (gastroesophageal reflux disease)   . Hepatitis 1971   "don't know if it was A or B" (07/22/2013)  . HYPERLIPIDEMIA-MIXED 2005   "went away after I started taking fish oil" (07/22/2013)  . Hypothyroidism   . Palpitations   . Pneumonia 2013  . Type II diabetes mellitus ([place])    "diet controlled" (07/22/2013)    Past Surgical History:  Procedure Laterality Date  . ANTERIOR CERVICAL DECOMP/DISCECTOMY FUSION  ?2003   "w/plating" (07/22/2013)  . BUNIONECTOMY Left 2013  . DILATION AND CURETTAGE OF UTERUS  1976  . PLANTAR FASCIA SURGERY Right 2011     Social History:  The patient  reports that she quit smoking about 11 years ago. Her smoking use included cigarettes. She has a 40.00 pack-year smoking history. She has never used smokeless tobacco. She reports current alcohol use. She reports that she does not use drugs.   Family History:  The patient's family history includes Diabetes in her maternal grandfather; Heart attack in her paternal grandfather; Heart disease in her unknown relative.    ROS:  Please see the history of present illness. All other systems are reviewed and  Negative to the above problem except as noted.    PHYSICAL EXAM: VS:  BP (!) 152/68   Pulse 66   Ht 5\' 6"  (1.676 m)   Wt 253 lb (114.8 kg)   BMI 40.84 kg/m   Gen  Morbidly obese 68 yo in no acute distress  HEENT: normal  Neck: JVP normal , carotid bruits, or masses Cardiac: RRR  No murmurs  No rubs, or gallops,no  edema  Respiratory:  clear to auscultation biilaterally  With forced expiration there is rhonchi and wheezes GI: soft, nontender, nondistended, + BS  No hepatomegaly  MS: no deformity Moving all extremities   Skin: warm and dry  Rash around mouth  SOme erythema at arms   Neuro:  Grossly intact  Psych: euthymic mood, full affect   EKG:  EKG is ordered today.  SB 66 bpm  LAFB  Lipid Panel    Component Value Date/Time   CHOL 188 11/08/2018 1537   TRIG 173 (H) 11/08/2018 1537   HDL 49 11/08/2018 1537   CHOLHDL 3.8 11/08/2018 1537   CHOLHDL 4.2 CALC 11/17/2007 0844   VLDL 18 11/17/2007 0844   LDLCALC 104 (H) 11/08/2018 1537      Wt Readings from Last 3 Encounters:  04/25/19 253 lb (114.8 kg)  10/08/18 236 lb 8 oz (107.3 kg)  05/16/18 252 lb (114.3 kg)      ASSESSMENT AND PLAN:  1  PAF No clinical recurrence  She is on flecanide 100 bid  EKG ok   Need to review   CHADSVASc is 2-3   Rare documented spell in past with severe illness      2  Lipids   Will get lipids  Minimal atheroscleroitic plaquing on aorta on CT in 2015      F/U 1 year    Current medicines are reviewed at length with the patient today.  The patient does not have concerns regarding medicines.  Signed, Dorris Carnes, MD  04/25/2019 3:23 PM    [address], [place], [place]  [address] Phone: [phone]; Fax: [phone]

## 2019-04-25 ENCOUNTER — Encounter: Payer: Self-pay | Admitting: Internal Medicine

## 2019-04-25 ENCOUNTER — Other Ambulatory Visit: Payer: Self-pay

## 2019-04-25 ENCOUNTER — Ambulatory Visit: Payer: PPO | Admitting: Internal Medicine

## 2019-04-25 VITALS — BP 152/68 | HR 66 | Ht 66.0 in | Wt 253.0 lb

## 2019-04-25 DIAGNOSIS — E782 Mixed hyperlipidemia: Secondary | ICD-10-CM | POA: Diagnosis not present

## 2019-04-25 DIAGNOSIS — R7309 Other abnormal glucose: Secondary | ICD-10-CM | POA: Diagnosis not present

## 2019-04-25 DIAGNOSIS — Z79899 Other long term (current) drug therapy: Secondary | ICD-10-CM

## 2019-04-25 DIAGNOSIS — H524 Presbyopia: Secondary | ICD-10-CM | POA: Diagnosis not present

## 2019-04-25 DIAGNOSIS — H52223 Regular astigmatism, bilateral: Secondary | ICD-10-CM | POA: Diagnosis not present

## 2019-04-25 DIAGNOSIS — H5213 Myopia, bilateral: Secondary | ICD-10-CM | POA: Diagnosis not present

## 2019-04-25 DIAGNOSIS — E119 Type 2 diabetes mellitus without complications: Secondary | ICD-10-CM | POA: Diagnosis not present

## 2019-04-25 NOTE — Patient Instructions (Signed)
Medication Instructions:  No changes *If you need a refill on your cardiac medications before your next appointment, please call your pharmacy*  Lab Work: Today: lipid panel, hgA1c If you have labs (blood work) drawn today and your tests are completely normal, you will receive your results only by: Marland Kitchen MyChart Message (if you have MyChart) OR . A paper copy in the mail If you have any lab test that is abnormal or we need to change your treatment, we will call you to review the results.  Testing/Procedures: none  Follow-Up: At [facility], you and your health needs are our priority.  As part of our continuing mission to provide you with exceptional heart care, we have created designated Provider Care Teams.  These Care Teams include your primary Cardiologist (physician) and Advanced Practice Providers (APPs -  Physician Assistants and Nurse Practitioners) who all work together to provide you with the care you need, when you need it.  Your next appointment:   6 months  The format for your next appointment:   In Person  Provider:   Dorris Carnes, MD  Other Instructions

## 2019-04-26 LAB — LIPID PANEL
Chol/HDL Ratio: 3.9 ratio (ref 0.0–4.4)
Cholesterol, Total: 210 mg/dL — ABNORMAL HIGH (ref 100–199)
HDL: 54 mg/dL (ref >39)
LDL Chol Calc (NIH): 128 mg/dL — ABNORMAL HIGH (ref 0–99)
Triglycerides: 159 mg/dL — ABNORMAL HIGH (ref 0–149)
VLDL Cholesterol Cal: 28 mg/dL (ref 5–40)

## 2019-04-26 LAB — HEMOGLOBIN A1C
Est. average glucose Bld gHb Est-mCnc: 134 mg/dL
Hgb A1c MFr Bld: 6.3 % — ABNORMAL HIGH (ref 4.8–5.6)

## 2019-04-28 DIAGNOSIS — M75121 Complete rotator cuff tear or rupture of right shoulder, not specified as traumatic: Secondary | ICD-10-CM | POA: Diagnosis not present

## 2019-04-28 DIAGNOSIS — G894 Chronic pain syndrome: Secondary | ICD-10-CM | POA: Diagnosis not present

## 2019-04-28 DIAGNOSIS — M47816 Spondylosis without myelopathy or radiculopathy, lumbar region: Secondary | ICD-10-CM | POA: Diagnosis not present

## 2019-04-28 DIAGNOSIS — M150 Primary generalized (osteo)arthritis: Secondary | ICD-10-CM | POA: Diagnosis not present

## 2019-04-29 ENCOUNTER — Telehealth: Payer: Self-pay | Admitting: Internal Medicine

## 2019-04-29 DIAGNOSIS — M2011 Hallux valgus (acquired), right foot: Secondary | ICD-10-CM | POA: Diagnosis not present

## 2019-04-29 DIAGNOSIS — L84 Corns and callosities: Secondary | ICD-10-CM | POA: Diagnosis not present

## 2019-04-29 DIAGNOSIS — M21621 Bunionette of right foot: Secondary | ICD-10-CM | POA: Diagnosis not present

## 2019-04-29 DIAGNOSIS — R7303 Prediabetes: Secondary | ICD-10-CM | POA: Diagnosis not present

## 2019-05-02 ENCOUNTER — Telehealth: Payer: Self-pay | Admitting: *Deleted

## 2019-05-02 DIAGNOSIS — E782 Mixed hyperlipidemia: Secondary | ICD-10-CM

## 2019-05-02 DIAGNOSIS — I700 Atherosclerosis of aorta: Secondary | ICD-10-CM

## 2019-05-02 DIAGNOSIS — G4733 Obstructive sleep apnea (adult) (pediatric): Secondary | ICD-10-CM | POA: Diagnosis not present

## 2019-05-02 MED ORDER — ROSUVASTATIN CALCIUM 10 MG PO TABS: 10.0000 mg | ORAL_TABLET | Freq: Every day | ORAL | 3 refills | Status: DC

## 2019-05-02 NOTE — Telephone Encounter (Signed)
-----   Message from Dorris Carnes V, MD sent at 04/29/2019 10:00 PM EST ----- Hgb A1C is 6.3  Watch carbs

## 2019-05-02 NOTE — Telephone Encounter (Signed)
Patient informed of results/review and recommendations by Dr. Harrington Challenger. She will start rosuvastatin 10 mg daily.  Lab appointment scheduled for 06/23/19.  Pt will call or MyChart if any concerns with the medication. PCP manages A1c, will send results.

## 2019-05-03 NOTE — Telephone Encounter (Signed)
ERROR

## 2019-05-07 DIAGNOSIS — G4733 Obstructive sleep apnea (adult) (pediatric): Secondary | ICD-10-CM | POA: Diagnosis not present

## 2019-05-18 DIAGNOSIS — G4733 Obstructive sleep apnea (adult) (pediatric): Secondary | ICD-10-CM | POA: Diagnosis not present

## 2019-05-18 DIAGNOSIS — J449 Chronic obstructive pulmonary disease, unspecified: Secondary | ICD-10-CM | POA: Diagnosis not present

## 2019-05-18 DIAGNOSIS — J4530 Mild persistent asthma, uncomplicated: Secondary | ICD-10-CM | POA: Diagnosis not present

## 2019-05-18 DIAGNOSIS — I5030 Unspecified diastolic (congestive) heart failure: Secondary | ICD-10-CM | POA: Diagnosis not present

## 2019-05-18 DIAGNOSIS — R911 Solitary pulmonary nodule: Secondary | ICD-10-CM | POA: Diagnosis not present

## 2019-05-18 DIAGNOSIS — Z79899 Other long term (current) drug therapy: Secondary | ICD-10-CM | POA: Diagnosis not present

## 2019-05-18 DIAGNOSIS — Z87891 Personal history of nicotine dependence: Secondary | ICD-10-CM | POA: Diagnosis not present

## 2019-05-18 DIAGNOSIS — J309 Allergic rhinitis, unspecified: Secondary | ICD-10-CM | POA: Diagnosis not present

## 2019-05-18 DIAGNOSIS — E669 Obesity, unspecified: Secondary | ICD-10-CM | POA: Diagnosis not present

## 2019-05-30 DIAGNOSIS — E782 Mixed hyperlipidemia: Secondary | ICD-10-CM | POA: Diagnosis not present

## 2019-05-30 DIAGNOSIS — R7301 Impaired fasting glucose: Secondary | ICD-10-CM | POA: Diagnosis not present

## 2019-05-30 DIAGNOSIS — E039 Hypothyroidism, unspecified: Secondary | ICD-10-CM | POA: Diagnosis not present

## 2019-05-31 DIAGNOSIS — I4891 Unspecified atrial fibrillation: Secondary | ICD-10-CM | POA: Diagnosis not present

## 2019-05-31 DIAGNOSIS — G894 Chronic pain syndrome: Secondary | ICD-10-CM | POA: Diagnosis not present

## 2019-05-31 DIAGNOSIS — F329 Major depressive disorder, single episode, unspecified: Secondary | ICD-10-CM | POA: Diagnosis not present

## 2019-05-31 DIAGNOSIS — J449 Chronic obstructive pulmonary disease, unspecified: Secondary | ICD-10-CM | POA: Diagnosis not present

## 2019-05-31 DIAGNOSIS — N183 Chronic kidney disease, stage 3 unspecified: Secondary | ICD-10-CM | POA: Diagnosis not present

## 2019-05-31 DIAGNOSIS — E039 Hypothyroidism, unspecified: Secondary | ICD-10-CM | POA: Diagnosis not present

## 2019-05-31 DIAGNOSIS — R7301 Impaired fasting glucose: Secondary | ICD-10-CM | POA: Diagnosis not present

## 2019-05-31 DIAGNOSIS — E782 Mixed hyperlipidemia: Secondary | ICD-10-CM | POA: Diagnosis not present

## 2019-05-31 DIAGNOSIS — Z0000 Encounter for general adult medical examination without abnormal findings: Secondary | ICD-10-CM | POA: Diagnosis not present

## 2019-06-01 NOTE — Telephone Encounter (Signed)
Please get copy of labs from last couple days   Fasting lipids

## 2019-06-06 DIAGNOSIS — G4733 Obstructive sleep apnea (adult) (pediatric): Secondary | ICD-10-CM | POA: Diagnosis not present

## 2019-06-11 DIAGNOSIS — Z20822 Contact with and (suspected) exposure to covid-19: Secondary | ICD-10-CM | POA: Diagnosis not present

## 2019-06-11 DIAGNOSIS — Z20828 Contact with and (suspected) exposure to other viral communicable diseases: Secondary | ICD-10-CM | POA: Diagnosis not present

## 2019-06-16 DIAGNOSIS — R918 Other nonspecific abnormal finding of lung field: Secondary | ICD-10-CM | POA: Diagnosis not present

## 2019-06-16 NOTE — Telephone Encounter (Signed)
Called PCP to get copy of most recent labs.   Left detailed VM requesting copy of most recent lab work including lipids be faxed to Dr. Harrington Challenger at [phone].

## 2019-06-22 ENCOUNTER — Telehealth: Payer: Self-pay | Admitting: Internal Medicine

## 2019-06-22 NOTE — Telephone Encounter (Signed)
Pt has written in  that she hasnow  had afib  Strips from Apple watch The readings from apple watch did not come through   Need to document, find way to transmit   Or set up for monitor WIth recurrence now she is 14, female, BP was up I would recomm anticoagulation with Xarelto 20 mg daily   Stop aspirin Would also recomm referral to EP for evaluation ([place])

## 2019-06-23 ENCOUNTER — Other Ambulatory Visit: Payer: PPO

## 2019-06-23 DIAGNOSIS — M150 Primary generalized (osteo)arthritis: Secondary | ICD-10-CM | POA: Diagnosis not present

## 2019-06-23 DIAGNOSIS — M75121 Complete rotator cuff tear or rupture of right shoulder, not specified as traumatic: Secondary | ICD-10-CM | POA: Diagnosis not present

## 2019-06-23 DIAGNOSIS — M47816 Spondylosis without myelopathy or radiculopathy, lumbar region: Secondary | ICD-10-CM | POA: Diagnosis not present

## 2019-06-23 DIAGNOSIS — G894 Chronic pain syndrome: Secondary | ICD-10-CM | POA: Diagnosis not present

## 2019-06-23 NOTE — Telephone Encounter (Signed)
Called pt  SHe says her heart is beating regularly now. I told her I cannot find strips   She will try to transmit again   Keep on same meds  If indeed afib then she should be on anticoagulant (CHADSVASc 3)  She will get back with msg today/tomorrow.

## 2019-06-23 NOTE — Telephone Encounter (Signed)
Left detailed message that I am trying to obtain strips that show her atrial fibrillation.  Asked her to my chart or call me back.

## 2019-06-24 NOTE — Telephone Encounter (Signed)
Apple watch strips sent to Dr. Harrington Challenger via [place] for review.

## 2019-06-24 NOTE — Addendum Note (Signed)
Addended by: Rodman Key on: 06/24/2019 12:15 PM   Modules accepted: Orders

## 2019-06-26 ENCOUNTER — Telehealth: Payer: Self-pay | Admitting: Internal Medicine

## 2019-06-26 DIAGNOSIS — I480 Paroxysmal atrial fibrillation: Secondary | ICD-10-CM

## 2019-06-26 NOTE — Telephone Encounter (Signed)
Monitor strips that pt sent in are not good quality   Lot of artifact   One looked like afib but very short  I would recomm an event monitor  3 wk  Does not need to be live  In regard to anticoagulation, until settled / confirmed I would recomm Xarelto 20 mg    To be on safe side  Re lipids:   Hold repeat since not on Crestor

## 2019-06-27 MED ORDER — RIVAROXABAN 20 MG PO TABS: 20.0000 mg | ORAL_TABLET | Freq: Every day | ORAL | 6 refills | Status: DC

## 2019-06-27 NOTE — Addendum Note (Signed)
Addended by: Rodman Key on: 06/27/2019 06:01 PM   Modules accepted: Orders

## 2019-06-27 NOTE — Telephone Encounter (Addendum)
Further monitor strips that are clearer have been reviewed by Dr. Harrington Challenger. No monitor ordered at this time.   Pt to start Xarelto 20 mg daily She has stopped Sudafed.    Thinks happening more than had in past.  Stopped sudafed. Stopped drinking coffee.  No other caffeine. No alcohol.  Has dark chocolate daily.  Will try to cut out.  Causes fatigue, no SOB, no chest pain.  Pt asked if any other medication adjustments ie would they increase her flecainide. She is aware if there are further recommendations from Dr. Harrington Challenger I will call her back.

## 2019-06-28 NOTE — Telephone Encounter (Signed)
Review of EKG from November and now current strips  Strips show Afib with RVR and SR On flecanide   Should stay on Xarelto Would refer to EP for further evaluation Viona Gilmore [place])

## 2019-06-29 DIAGNOSIS — R7303 Prediabetes: Secondary | ICD-10-CM | POA: Diagnosis not present

## 2019-06-29 DIAGNOSIS — L84 Corns and callosities: Secondary | ICD-10-CM | POA: Diagnosis not present

## 2019-06-29 DIAGNOSIS — M2011 Hallux valgus (acquired), right foot: Secondary | ICD-10-CM | POA: Diagnosis not present

## 2019-06-29 DIAGNOSIS — M21621 Bunionette of right foot: Secondary | ICD-10-CM | POA: Diagnosis not present

## 2019-06-29 NOTE — Addendum Note (Signed)
Addended by: Rodman Key on: 06/29/2019 02:25 PM   Modules accepted: Orders

## 2019-06-29 NOTE — Telephone Encounter (Signed)
Since she has stopped drinking coffee and taking sudafed altogether she thinks her symptoms are better.  Has not happened in lasat 2-3 days.  Aware to continue Xarelto and flecainide and that Dr. Macky Lower scheduler will call her with an appointment time.

## 2019-06-30 ENCOUNTER — Other Ambulatory Visit: Payer: PPO

## 2019-06-30 ENCOUNTER — Encounter: Payer: Self-pay | Admitting: Cardiology

## 2019-06-30 ENCOUNTER — Ambulatory Visit: Payer: PPO | Admitting: Cardiology

## 2019-06-30 ENCOUNTER — Other Ambulatory Visit: Payer: Self-pay

## 2019-06-30 VITALS — BP 120/66 | HR 58 | Ht 66.0 in | Wt 258.0 lb

## 2019-06-30 DIAGNOSIS — I480 Paroxysmal atrial fibrillation: Secondary | ICD-10-CM | POA: Diagnosis not present

## 2019-06-30 NOTE — Patient Instructions (Signed)
Medication Instructions:  Your physician recommends that you continue on your current medications as directed. Please refer to the Current Medication list given to you today.  * If you need a refill on your cardiac medications before your next appointment, please call your pharmacy.   Labwork: None ordered If you have labs (blood work) drawn today and your tests are completely normal, you will receive your results only by:  [place] (if you have MyChart) OR  A paper copy in the mail If you have any lab test that is abnormal or we need to change your treatment, we will call you to review the results.  Testing/Procedures: None ordered  Follow-Up: At [facility], you and your health needs are our priority.  As part of our continuing mission to provide you with exceptional heart care, we have created designated Provider Care Teams.  These Care Teams include your primary Cardiologist (physician) and Advanced Practice Providers (APPs -  Physician Assistants and Nurse Practitioners) who all work together to provide you with the care you need, when you need it.  You will need a follow up appointment in 3 months.  Please call our office 2 months in advance to schedule this appointment.  You may see Dr Curt Bears or one of the following Advanced Practice Providers on your designated Care Team:    Chanetta Marshall, NP  Tommye Standard, PA-C  Oda Kilts, [place]    Thank you for choosing [facility]!!   Trinidad Curet, RN [phone]  Any Other Special Instructions Will Be Listed Below (If Applicable).   Cardiac Ablation Cardiac ablation is a procedure to disable (ablate) a small amount of heart tissue in very specific places. The heart has many electrical connections. Sometimes these connections are abnormal and can cause the heart to beat very fast or irregularly. Ablating some of the problem areas can improve the heart rhythm or return it to normal. Ablation may be done for people  who:  Have Wolff-Parkinson-White syndrome.  Have fast heart rhythms (tachycardia).  Have taken medicines for an abnormal heart rhythm (arrhythmia) that were not effective or caused side effects.  Have a high-risk heartbeat that may be life-threatening. During the procedure, a small incision is made in the neck or the groin, and a long, thin, flexible tube (catheter) is inserted into the incision and moved to the heart. Small devices (electrodes) on the tip of the catheter will send out electrical currents. A type of X-ray (fluoroscopy) will be used to help guide the catheter and to provide images of the heart. Tell a health care provider about:  Any allergies you have.  All medicines you are taking, including vitamins, herbs, eye drops, creams, and over-the-counter medicines.  Any problems you or family members have had with anesthetic medicines.  Any blood disorders you have.  Any surgeries you have had.  Any medical conditions you have, such as kidney failure.  Whether you are pregnant or may be pregnant. What are the risks? Generally, this is a safe procedure. However, problems may occur, including:  Infection.  Bruising and bleeding at the catheter insertion site.  Bleeding into the chest, especially into the sac that surrounds the heart. This is a serious complication.  Stroke or blood clots.  Damage to other structures or organs.  Allergic reaction to medicines or dyes.  Need for a permanent pacemaker if the normal electrical system is damaged. A pacemaker is a small computer that sends electrical signals to the heart and helps your  heart beat normally.  The procedure not being fully effective. This may not be recognized until months later. Repeat ablation procedures are sometimes required. What happens before the procedure?  Follow instructions from your health care provider about eating or drinking restrictions.  Ask your health care provider about: ? Changing  or stopping your regular medicines. This is especially important if you are taking diabetes medicines or blood thinners. ? Taking medicines such as aspirin and ibuprofen. These medicines can thin your blood. Do not take these medicines before your procedure if your health care provider instructs you not to.  Plan to have someone take you home from the hospital or clinic.  If you will be going home right after the procedure, plan to have someone with you for 24 hours. What happens during the procedure?  To lower your risk of infection: ? Your health care team will wash or sanitize their hands. ? Your skin will be washed with soap. ? Hair may be removed from the incision area.  An IV tube will be inserted into one of your veins.  You will be given a medicine to help you relax (sedative).  The skin on your neck or groin will be numbed.  An incision will be made in your neck or your groin.  A needle will be inserted through the incision and into a large vein in your neck or groin.  A catheter will be inserted into the needle and moved to your heart.  Dye may be injected through the catheter to help your surgeon see the area of the heart that needs treatment.  Electrical currents will be sent from the catheter to ablate heart tissue in desired areas. There are three types of energy that may be used to ablate heart tissue: ? Heat (radiofrequency energy). ? Laser energy. ? Extreme cold (cryoablation).  When the necessary tissue has been ablated, the catheter will be removed.  Pressure will be held on the catheter insertion area to prevent excessive bleeding.  A bandage (dressing) will be placed over the catheter insertion area. The procedure may vary among health care providers and hospitals. What happens after the procedure?  Your blood pressure, heart rate, breathing rate, and blood oxygen level will be monitored until the medicines you were given have worn off.  Your catheter  insertion area will be monitored for bleeding. You will need to lie still for a few hours to ensure that you do not bleed from the catheter insertion area.  Do not drive for 24 hours or as long as directed by your health care provider. Summary  Cardiac ablation is a procedure to disable (ablate) a small amount of heart tissue in very specific places. Ablating some of the problem areas can improve the heart rhythm or return it to normal.  During the procedure, electrical currents will be sent from the catheter to ablate heart tissue in desired areas. This information is not intended to replace advice given to you by your health care provider. Make sure you discuss any questions you have with your health care provider. Document Revised: 11/16/2017 Document Reviewed: 04/14/2016 Elsevier Patient Education  [place].

## 2019-06-30 NOTE — Progress Notes (Signed)
Electrophysiology Office Note   Date:  06/30/2019   ID:  Taylor, [name] 1951-03-17, MRN [id number]  PCP:  [name], PA-C  Cardiologist:  Harrington Challenger Primary Electrophysiologist:  Lillyonna Armstead Meredith Leeds, MD    Chief Complaint: AF   History of Present Illness: Taylor [name] is a 69 y.o. female who is being seen today for the evaluation of AF at the request of Fay Records, MD. Presenting today for electrophysiology evaluation.  She has a history significant for atrial fibrillation, COPD.  Atrial fibrillation initially presented in 2009, possibly due to a URI.  She started drinking coffee around the holidays and started having more episodes of atrial fibrillation.  Her symptoms are palpitations and some mild shortness of breath.  She is sent in multiple recordings from her watch showing atrial fibrillation.  She is held off on caffeine use over the last few days and has had no further episodes.  Today, she denies symptoms of palpitations, chest pain, shortness of breath, orthopnea, PND, lower extremity edema, claudication, dizziness, presyncope, syncope, bleeding, or neurologic sequela. The patient is tolerating medications without difficulties.    Past Medical History:  Diagnosis Date  . Anxiety   . Arthritis    "hands, neck, back" (07/22/2013)  . Atrial fibrillation ([place])   . CHEST DISCOMFORT   . Chronic back pain    "started in lower back; getting to be all over my back" (07/22/2013)  . Chronic bronchitis ([place])    "I've had it 2 years in a row; do have some trouble breathing at times; use an inhaler prn; not asthma" (07/22/2013)  . Chronic neck pain   . COPD (chronic obstructive pulmonary disease) ([place])   . DDD (degenerative disc disease), cervical   . DDD (degenerative disc disease), lumbar   . Depression   . Dysrhythmia   . GERD (gastroesophageal reflux disease)   . Hepatitis 1971   "don't know if it was A or B" (07/22/2013)  . HYPERLIPIDEMIA-MIXED 2005   "went away  after I started taking fish oil" (07/22/2013)  . Hypothyroidism   . Palpitations   . Pneumonia 2013  . Type II diabetes mellitus ([place])    "diet controlled" (07/22/2013)   Past Surgical History:  Procedure Laterality Date  . ANTERIOR CERVICAL DECOMP/DISCECTOMY FUSION  ?2003   "w/plating" (07/22/2013)  . BUNIONECTOMY Left 2013  . DILATION AND CURETTAGE OF UTERUS  1976  . PLANTAR FASCIA SURGERY Right 2011     Current Outpatient Medications  Medication Sig Dispense Refill  . acetaminophen (TYLENOL) 500 MG tablet Take 500 mg by mouth every 6 (six) hours as needed for headache.    . albuterol (PROVENTIL HFA;VENTOLIN HFA) 108 (90 BASE) MCG/ACT inhaler Inhale 1 puff into the lungs every 6 (six) hours as needed for wheezing or shortness of breath. 1 Inhaler 1  . Alpha-Lipoic Acid 100 MG CAPS Take by mouth.    . ALPRAZolam (XANAX) 0.25 MG tablet Take 0.25 mg by mouth 2 (two) times daily as needed for anxiety.     . Ascorbic Acid (VITAMIN C) 1000 MG tablet Take by mouth.    Marland Kitchen buPROPion (WELLBUTRIN XL) 150 MG 24 hr tablet Take 150 mg by mouth daily.     . cetirizine (ZYRTEC) 10 MG tablet Take by mouth.    . chlorhexidine (PERIDEX) 0.12 % solution SWISH WITH 15 ML'S FOR 2 MINUTES, THEN SPIT TWICE A DAY    . Cholecalciferol (VITAMIN D3) 5000 UNITS CAPS Take 5,000 mg  by mouth daily.    Marland Kitchen CINNAMON PO Take 1 tablet by mouth daily.    Marland Kitchen desonide (DESOWEN) 0.05 % cream Apply topically.    Marland Kitchen DHEA 25 MG CAPS Take 25 mg by mouth daily.    Marland Kitchen escitalopram (LEXAPRO) 10 MG tablet Take by mouth.    . ESTRADIOL TD Place 1 mL onto the skin 2 (two) times daily. Estradiol 4.5mg /gm cream    . flecainide (TAMBOCOR) 100 MG tablet Take 1 tablet (100 mg total) by mouth 2 (two) times daily. 60 tablet 11  . Fluticasone-Salmeterol (ADVAIR) 500-50 MCG/DOSE AEPB Inhale into the lungs.    . furosemide (LASIX) 80 MG tablet Take 1 tablet (80 mg total) by mouth daily. 90 tablet 3  . guaiFENesin (MUCINEX) 600 MG 12 hr tablet  Take by mouth.    . hydrOXYzine (ATARAX/VISTARIL) 50 MG tablet Take 50 mg by mouth as needed.     Marland Kitchen ketoconazole (NIZORAL) 2 % cream ketoconazole 2 % topical cream    . metoprolol tartrate (LOPRESSOR) 25 MG tablet Take 1 tablet (25 mg total) by mouth 2 (two) times daily. 180 tablet 3  . montelukast (SINGULAIR) 10 MG tablet Take 10 mg by mouth at bedtime.    Marland Kitchen NATURE-THROID 97.5 MG TABS Take 97.5 mg by mouth daily.     . OIL OF OREGANO PO Take 1 capsule by mouth daily.    Marland Kitchen omeprazole (PRILOSEC) 20 MG capsule Take by mouth.    . oxyCODONE (ROXICODONE) 15 MG immediate release tablet take 4 times daily    . potassium chloride (KLOR-CON M10) 10 MEQ tablet Take 1 tablet (10 mEq total) by mouth daily. 90 tablet 3  . pregabalin (LYRICA) 75 MG capsule Take by mouth.    . Pregnenolone Micronized POWD Take 100 mg by mouth daily.     . progesterone (PROMETRIUM) 100 MG capsule Take 100 mg by mouth Daily.     . promethazine (PHENERGAN) 25 MG tablet Take 25 mg by mouth every 6 (six) hours as needed for nausea.     . rivaroxaban (XARELTO) 20 MG TABS tablet Take 1 tablet (20 mg total) by mouth daily with supper. 30 tablet 6  . Sodium Fluoride (PREVIDENT 5000 BOOSTER DT) Place 1 application onto teeth daily.     . TESTOSTERONE TD Place 1 application onto the skin 4 (four) times a week. Testosterone 2% cream.  Apply 1/4 to 1/2 grams (1-2 clicks) to back of knees every Wednesday, Thursday, Saturday and Sunday.    Marland Kitchen tiZANidine (ZANAFLEX) 4 MG tablet Take 2 mg by mouth as needed for muscle spasms.    Marland Kitchen VITAMIN A PO Take by mouth.    Marland Kitchen VITAMIN K PO Take by mouth.     No current facility-administered medications for this visit.    Allergies:   Avelox [moxifloxacin hcl in nacl], Linezolid, Penicillin g, Venlafaxine, Rosuvastatin, Statins, Cephalexin, Moxifloxacin, Nitrofurantoin, Other, and Penicillins   Social History:  The patient  reports that she quit smoking about 11 years ago. Her smoking use included  cigarettes. She has a 40.00 pack-year smoking history. She has never used smokeless tobacco. She reports current alcohol use. She reports that she does not use drugs.   Family History:  The patient's family history includes Diabetes in her maternal grandfather; Heart attack in her paternal grandfather; Heart disease in an other family member.    ROS:  Please see the history of present illness.   Otherwise, review of systems is positive for  none.   All other systems are reviewed and negative.    PHYSICAL EXAM: VS:  BP 120/66   Pulse (!) 58   Ht 5\' 6"  (1.676 m)   Wt 258 lb (117 kg)   SpO2 96%   BMI 41.64 kg/m  , BMI Body mass index is 41.64 kg/m. GEN: Well nourished, well developed, in no acute distress  HEENT: normal  Neck: no JVD, carotid bruits, or masses Cardiac: RRR; no murmurs, rubs, or gallops,no edema  Respiratory:  clear to auscultation bilaterally, normal work of breathing GI: soft, nontender, nondistended, + BS MS: no deformity or atrophy  Skin: warm and dry Neuro:  Strength and sensation are intact Psych: euthymic mood, full affect  EKG:  EKG is not ordered today. Personal review of the ekg ordered 04/25/19 shows sinus rhythm, left anterior fascicular block  Recent Labs: 11/08/2018: Hemoglobin 14.0; Platelets 306 04/22/2019: BUN 16; Creatinine, Ser 1.07; NT-Pro BNP 397; Potassium 4.3; Sodium 140    Lipid Panel     Component Value Date/Time   CHOL 210 (H) 04/25/2019 1543   TRIG 159 (H) 04/25/2019 1543   HDL 54 04/25/2019 1543   CHOLHDL 3.9 04/25/2019 1543   CHOLHDL 4.2 CALC 11/17/2007 0844   VLDL 18 11/17/2007 0844   LDLCALC 128 (H) 04/25/2019 1543     Wt Readings from Last 3 Encounters:  06/30/19 258 lb (117 kg)  04/25/19 253 lb (114.8 kg)  10/08/18 236 lb 8 oz (107.3 kg)      Other studies Reviewed: Additional studies/ records that were reviewed today include: TTE 09/14/17  Review of the above records today demonstrates:  - Left ventricle: The cavity  size was normal. Wall thickness was   increased in a pattern of mild LVH. Systolic function was normal.   The estimated ejection fraction was in the range of 55% to 60%.   Wall motion was normal; there were no regional wall motion   abnormalities. Features are consistent with a pseudonormal left   ventricular filling pattern, with concomitant abnormal relaxation   and increased filling pressure (grade 2 diastolic dysfunction).   Doppler parameters are consistent with high ventricular filling   pressure. - Left atrium: The atrium was mildly dilated. - Pulmonary arteries: Systolic pressure was mildly increased. PA   peak pressure: 37 mm Hg (S).  Monitor 11/22/18 personally reviewed SInus rhythm   NO signifcant arrhythmias detected Patient did not appear to have any symptoms while wearing  ASSESSMENT AND PLAN:  1.  Paroxysmal atrial fibrillation: Currently on flecainide, Xarelto.  CHA2DS2-VASc of 2-3.  She has been having episodes of atrial fibrillation that have been occurring more frequently.  She feels that this is due to caffeine use.  I did discuss with her the possibility of ablation versus dofetilide loading versus continuing flecainide.  At this point she Taylor [name] cut out caffeine and see if this makes a difference.    2.  OSA: CPAP compliance encouraged  3.  Obesity: Encouraged 150 minutes of moderate level exercise a week.  Case discussed with primary cardiology  Current medicines are reviewed at length with the patient today.   The patient does not have concerns regarding her medicines.  The following changes were made today:  none  Labs/ tests ordered today include:  No orders of the defined types were placed in this encounter.    Disposition:   FU with Jaylnn Ullery 3 months  Signed, Starsha Morning Meredith Leeds, MD  06/30/2019 11:39 AM  [place] [address] [phone] (office) [phone] (fax)

## 2019-07-07 DIAGNOSIS — G4733 Obstructive sleep apnea (adult) (pediatric): Secondary | ICD-10-CM | POA: Diagnosis not present

## 2019-07-11 DIAGNOSIS — E119 Type 2 diabetes mellitus without complications: Secondary | ICD-10-CM | POA: Diagnosis not present

## 2019-07-11 DIAGNOSIS — Z1211 Encounter for screening for malignant neoplasm of colon: Secondary | ICD-10-CM | POA: Diagnosis not present

## 2019-07-11 DIAGNOSIS — D123 Benign neoplasm of transverse colon: Secondary | ICD-10-CM | POA: Diagnosis not present

## 2019-07-11 DIAGNOSIS — D124 Benign neoplasm of descending colon: Secondary | ICD-10-CM | POA: Diagnosis not present

## 2019-07-11 DIAGNOSIS — K5730 Diverticulosis of large intestine without perforation or abscess without bleeding: Secondary | ICD-10-CM | POA: Diagnosis not present

## 2019-07-11 DIAGNOSIS — K635 Polyp of colon: Secondary | ICD-10-CM | POA: Diagnosis not present

## 2019-07-18 ENCOUNTER — Ambulatory Visit: Payer: PPO | Attending: Internal Medicine

## 2019-07-18 DIAGNOSIS — Z23 Encounter for immunization: Secondary | ICD-10-CM

## 2019-07-18 NOTE — Progress Notes (Signed)
   Covid-19 Vaccination Clinic  Name:  Taylor [name]    MRN: [id number] DOB: April 16, 1951  07/18/2019  Taylor [name] was observed post Covid-19 immunization for 15 minutes without incidence. She was provided with Vaccine Information Sheet and instruction to access the V-Safe system.   Taylor [name] was instructed to call 911 with any severe reactions post vaccine: Marland Kitchen Difficulty breathing  . Swelling of your face and throat  . A fast heartbeat  . A bad rash all over your body  . Dizziness and weakness    Immunizations Administered    Name Date Dose VIS Date Route   Pfizer COVID-19 Vaccine 07/18/2019  3:05 PM 0.3 mL 05/20/2019 Intramuscular   Manufacturer: [place]   Lot: [id number]   [place]: [id number]

## 2019-07-28 DIAGNOSIS — G4733 Obstructive sleep apnea (adult) (pediatric): Secondary | ICD-10-CM | POA: Diagnosis not present

## 2019-08-05 MED ORDER — RIVAROXABAN 20 MG PO TABS: 20.0000 mg | ORAL_TABLET | Freq: Every day | ORAL | 3 refills | Status: DC

## 2019-08-12 ENCOUNTER — Ambulatory Visit: Payer: PPO | Attending: Internal Medicine

## 2019-08-12 DIAGNOSIS — Z23 Encounter for immunization: Secondary | ICD-10-CM | POA: Insufficient documentation

## 2019-08-12 NOTE — Progress Notes (Signed)
   Covid-19 Vaccination Clinic  Name:  Taylor [name]    MRN: [id number] DOB: 1950-07-03  08/12/2019  [name] was observed post Covid-19 immunization for 15 minutes without incident. She was provided with Vaccine Information Sheet and instruction to access the V-Safe system.   [name] was instructed to call 911 with any severe reactions post vaccine: Marland Kitchen Difficulty breathing  . Swelling of face and throat  . A fast heartbeat  . A bad rash all over body  . Dizziness and weakness   Immunizations Administered    Name Date Dose VIS Date Route   Pfizer COVID-19 Vaccine 08/12/2019 11:20 AM 0.3 mL 05/20/2019 Intramuscular   Manufacturer: [place]   Lot: [id number]   [place]: [id number]

## 2019-08-17 DIAGNOSIS — M150 Primary generalized (osteo)arthritis: Secondary | ICD-10-CM | POA: Diagnosis not present

## 2019-08-17 DIAGNOSIS — M47816 Spondylosis without myelopathy or radiculopathy, lumbar region: Secondary | ICD-10-CM | POA: Diagnosis not present

## 2019-08-17 DIAGNOSIS — M75121 Complete rotator cuff tear or rupture of right shoulder, not specified as traumatic: Secondary | ICD-10-CM | POA: Diagnosis not present

## 2019-08-17 DIAGNOSIS — G894 Chronic pain syndrome: Secondary | ICD-10-CM | POA: Diagnosis not present

## 2019-08-26 DIAGNOSIS — M21621 Bunionette of right foot: Secondary | ICD-10-CM | POA: Diagnosis not present

## 2019-08-26 DIAGNOSIS — M2011 Hallux valgus (acquired), right foot: Secondary | ICD-10-CM | POA: Diagnosis not present

## 2019-08-26 DIAGNOSIS — L84 Corns and callosities: Secondary | ICD-10-CM | POA: Diagnosis not present

## 2019-08-26 DIAGNOSIS — R7303 Prediabetes: Secondary | ICD-10-CM | POA: Diagnosis not present

## 2019-08-29 DIAGNOSIS — E039 Hypothyroidism, unspecified: Secondary | ICD-10-CM | POA: Diagnosis not present

## 2019-08-29 DIAGNOSIS — E782 Mixed hyperlipidemia: Secondary | ICD-10-CM | POA: Diagnosis not present

## 2019-08-29 DIAGNOSIS — R7301 Impaired fasting glucose: Secondary | ICD-10-CM | POA: Diagnosis not present

## 2019-08-29 DIAGNOSIS — G4733 Obstructive sleep apnea (adult) (pediatric): Secondary | ICD-10-CM | POA: Diagnosis not present

## 2019-09-01 ENCOUNTER — Telehealth: Payer: Self-pay | Admitting: Cardiology

## 2019-09-01 NOTE — Telephone Encounter (Signed)
Spoke with the patient and advised her that we were only letting visitors accompany patients if there is a physical/mental limitation. I advised that her husband could be put on speaker phone during the visit so that he could hear everything and be a part of the visit. Patient verbalized understanding.

## 2019-09-01 NOTE — Telephone Encounter (Signed)
   Pt would like to bring her husband to her appt on 09/06/19. She said since it's about her surgery she would like to have her husband to be there to have an extra ear for important information.  Please advise

## 2019-09-02 ENCOUNTER — Telehealth: Payer: Self-pay

## 2019-09-02 DIAGNOSIS — G8929 Other chronic pain: Secondary | ICD-10-CM | POA: Diagnosis not present

## 2019-09-02 DIAGNOSIS — M25511 Pain in right shoulder: Secondary | ICD-10-CM | POA: Diagnosis not present

## 2019-09-02 DIAGNOSIS — I4891 Unspecified atrial fibrillation: Secondary | ICD-10-CM | POA: Diagnosis not present

## 2019-09-02 DIAGNOSIS — R7301 Impaired fasting glucose: Secondary | ICD-10-CM | POA: Diagnosis not present

## 2019-09-02 DIAGNOSIS — F329 Major depressive disorder, single episode, unspecified: Secondary | ICD-10-CM | POA: Diagnosis not present

## 2019-09-02 DIAGNOSIS — M25552 Pain in left hip: Secondary | ICD-10-CM | POA: Diagnosis not present

## 2019-09-02 DIAGNOSIS — E039 Hypothyroidism, unspecified: Secondary | ICD-10-CM | POA: Diagnosis not present

## 2019-09-02 DIAGNOSIS — E782 Mixed hyperlipidemia: Secondary | ICD-10-CM | POA: Diagnosis not present

## 2019-09-02 NOTE — Telephone Encounter (Signed)
I spoke to the patient and will supply samples of Xarelto 20 mg.  He husband will pick up.

## 2019-09-02 NOTE — Telephone Encounter (Signed)
**Note De-Identified Grettel Rames Obfuscation** Thanks Legrand Como. Please see Pt Message note in the pts chart from 3/25 for more details.

## 2019-09-02 NOTE — Telephone Encounter (Signed)
Thx for tracking down   Please let pt know she can pick up samples

## 2019-09-06 ENCOUNTER — Other Ambulatory Visit: Payer: Self-pay

## 2019-09-06 ENCOUNTER — Encounter: Payer: Self-pay | Admitting: Cardiology

## 2019-09-06 ENCOUNTER — Ambulatory Visit: Payer: PPO | Admitting: Cardiology

## 2019-09-06 VITALS — BP 122/78 | HR 67 | Ht 66.0 in | Wt 254.0 lb

## 2019-09-06 DIAGNOSIS — I4891 Unspecified atrial fibrillation: Secondary | ICD-10-CM | POA: Diagnosis not present

## 2019-09-06 DIAGNOSIS — Z01812 Encounter for preprocedural laboratory examination: Secondary | ICD-10-CM | POA: Diagnosis not present

## 2019-09-06 DIAGNOSIS — I480 Paroxysmal atrial fibrillation: Secondary | ICD-10-CM | POA: Diagnosis not present

## 2019-09-06 NOTE — Progress Notes (Signed)
Electrophysiology Office Note   Date:  09/06/2019   ID:  Taylor, [name] 11/23/1950, MRN [id number]  PCP:  Taylor Beach, MD  Cardiologist:  Taylor [name] Primary Electrophysiologist:  Taylor [name] Taylor Leeds, MD    Chief Complaint: AF   History of Present Illness: Taylor [name] is a 69 y.o. female who is being seen today for the evaluation of AF at the request of Taylor Connors, PA-C. Presenting today for electrophysiology evaluation.  She has a history significant for atrial fibrillation, COPD.  Atrial fibrillation initially presented in 2009, possibly due to a URI.  She started drinking coffee around the holidays and started having more episodes of atrial fibrillation.  Her symptoms are palpitations and some mild shortness of breath.  She is sent in multiple recordings from her watch showing atrial fibrillation.  She is held off on caffeine use over the last few days and has had no further episodes.  Today, denies symptoms of chest pain, shortness of breath, orthopnea, PND, lower extremity edema, claudication, dizziness, presyncope, syncope, bleeding, or neurologic sequela. The patient is tolerating medications without difficulties.  She unfortunately has continued to have episodes of atrial fibrillation despite flecainide therapy.  She continues to have weakness and fatigue as well as palpitations.  These occur multiple times a week.  She does not wish to transition to other antiarrhythmics and would prefer ablation at this time.   Past Medical History:  Diagnosis Date  . Anxiety   . Arthritis    "hands, neck, back" (07/22/2013)  . Atrial fibrillation ([place])   . CHEST DISCOMFORT   . Chronic back pain    "started in lower back; getting to be all over my back" (07/22/2013)  . Chronic bronchitis ([place])    "I've had it 2 years in a row; do have some trouble breathing at times; use an inhaler prn; not asthma" (07/22/2013)  . Chronic neck pain   . COPD (chronic obstructive pulmonary disease)  ([place])   . DDD (degenerative disc disease), cervical   . DDD (degenerative disc disease), lumbar   . Depression   . Dysrhythmia   . GERD (gastroesophageal reflux disease)   . Hepatitis 1971   "don't know if it was A or B" (07/22/2013)  . HYPERLIPIDEMIA-MIXED 2005   "went away after I started taking fish oil" (07/22/2013)  . Hypothyroidism   . Palpitations   . Pneumonia 2013  . Type II diabetes mellitus ([place])    "diet controlled" (07/22/2013)   Past Surgical History:  Procedure Laterality Date  . ANTERIOR CERVICAL DECOMP/DISCECTOMY FUSION  ?2003   "w/plating" (07/22/2013)  . BUNIONECTOMY Left 2013  . DILATION AND CURETTAGE OF UTERUS  1976  . PLANTAR FASCIA SURGERY Right 2011     Current Outpatient Medications  Medication Sig Dispense Refill  . acetaminophen (TYLENOL) 500 MG tablet Take 500 mg by mouth every 6 (six) hours as needed for headache.    . albuterol (PROVENTIL HFA;VENTOLIN HFA) 108 (90 BASE) MCG/ACT inhaler Inhale 1 puff into the lungs every 6 (six) hours as needed for wheezing or shortness of breath. 1 Inhaler 1  . Alpha-Lipoic Acid 100 MG CAPS Take by mouth.    . ALPRAZolam (XANAX) 0.25 MG tablet Take 0.25 mg by mouth 2 (two) times daily as needed for anxiety.     . Ascorbic Acid (VITAMIN C) 1000 MG tablet Take by mouth.    Marland Kitchen buPROPion (WELLBUTRIN XL) 150 MG 24 hr tablet Take 150 mg by mouth daily.     Marland Kitchen  cetirizine (ZYRTEC) 10 MG tablet Take by mouth.    . chlorhexidine (PERIDEX) 0.12 % solution SWISH WITH 15 ML'S FOR 2 MINUTES, THEN SPIT TWICE A DAY    . Cholecalciferol (VITAMIN D3) 5000 UNITS CAPS Take 5,000 mg by mouth daily.    Marland Kitchen CINNAMON PO Take 1 tablet by mouth daily.    Marland Kitchen DHEA 25 MG CAPS Take 25 mg by mouth daily.    Marland Kitchen ESTRADIOL TD Place 1 mL onto the skin 2 (two) times daily. Estradiol 4.5mg /gm cream    . flecainide (TAMBOCOR) 100 MG tablet Take 1 tablet (100 mg total) by mouth 2 (two) times daily. 60 tablet 11  . Fluticasone-Salmeterol (ADVAIR) 500-50 MCG/DOSE  AEPB Inhale into the lungs.    . furosemide (LASIX) 80 MG tablet Take 1 tablet (80 mg total) by mouth daily. 90 tablet 3  . guaiFENesin (MUCINEX) 600 MG 12 hr tablet Take by mouth.    . hydrOXYzine (ATARAX/VISTARIL) 50 MG tablet Take 50 mg by mouth as needed.     Marland Kitchen ketoconazole (NIZORAL) 2 % cream ketoconazole 2 % topical cream    . metoprolol tartrate (LOPRESSOR) 25 MG tablet Take 1 tablet (25 mg total) by mouth 2 (two) times daily. 180 tablet 3  . montelukast (SINGULAIR) 10 MG tablet Take 10 mg by mouth at bedtime.    Marland Kitchen NATURE-THROID 97.5 MG TABS Take 97.5 mg by mouth daily.     . OIL OF OREGANO PO Take 1 capsule by mouth daily.    Marland Kitchen omeprazole (PRILOSEC) 20 MG capsule Take by mouth.    . oxyCODONE (ROXICODONE) 15 MG immediate release tablet take 4 times daily    . potassium chloride (KLOR-CON M10) 10 MEQ tablet Take 1 tablet (10 mEq total) by mouth daily. 90 tablet 3  . pregabalin (LYRICA) 75 MG capsule Take by mouth.    . Pregnenolone Micronized POWD Take 100 mg by mouth daily.     . progesterone (PROMETRIUM) 100 MG capsule Take 100 mg by mouth Daily.     . promethazine (PHENERGAN) 25 MG tablet Take 25 mg by mouth every 6 (six) hours as needed for nausea.     . rivaroxaban (XARELTO) 20 MG TABS tablet Take 1 tablet (20 mg total) by mouth daily with supper. 90 tablet 3  . Sodium Fluoride (PREVIDENT 5000 BOOSTER DT) Place 1 application onto teeth daily.     . TESTOSTERONE TD Place 1 application onto the skin 4 (four) times a week. Testosterone 2% cream.  Apply 1/4 to 1/2 grams (1-2 clicks) to back of knees every Wednesday, Thursday, Saturday and Sunday.    Marland Kitchen tiZANidine (ZANAFLEX) 4 MG tablet Take 2 mg by mouth as needed for muscle spasms.    Marland Kitchen VITAMIN A PO Take by mouth.    Marland Kitchen VITAMIN K PO Take by mouth.    . escitalopram (LEXAPRO) 10 MG tablet Take by mouth.     No current facility-administered medications for this visit.    Allergies:   Avelox [moxifloxacin hcl in nacl], Linezolid,  Penicillin g, Venlafaxine, Rosuvastatin, Statins, Cephalexin, Moxifloxacin, Nitrofurantoin, Other, and Penicillins   Social History:  The patient  reports that she quit smoking about 11 years ago. Her smoking use included cigarettes. She has a 40.00 pack-year smoking history. She has never used smokeless tobacco. She reports current alcohol use. She reports that she does not use drugs.   Family History:  The patient's family history includes Diabetes in her maternal grandfather; Heart attack in  her paternal grandfather; Heart disease in an other family member.   ROS:  Please see the history of present illness.   Otherwise, review of systems is positive for none.   All other systems are reviewed and negative.   PHYSICAL EXAM: VS:  BP 122/78   Pulse 67   Ht 5\' 6"  (1.676 m)   Wt 254 lb (115.2 kg)   SpO2 97%   BMI 41.00 kg/m  , BMI Body mass index is 41 kg/m. GEN: Well nourished, well developed, in no acute distress  HEENT: normal  Neck: no JVD, carotid bruits, or masses Cardiac: RRR; no murmurs, rubs, or gallops,no edema  Respiratory:  clear to auscultation bilaterally, normal work of breathing GI: soft, nontender, nondistended, + BS MS: no deformity or atrophy  Skin: warm and dry Neuro:  Strength and sensation are intact Psych: euthymic mood, full affect  EKG:  EKG is ordered today. Personal review of the ekg ordered  shows sinus rhythm, rate 67  Recent Labs: 11/08/2018: Hemoglobin 14.0; Platelets 306 04/22/2019: BUN 16; Creatinine, Ser 1.07; NT-Pro BNP 397; Potassium 4.3; Sodium 140    Lipid Panel     Component Value Date/Time   CHOL 210 (H) 04/25/2019 1543   TRIG 159 (H) 04/25/2019 1543   HDL 54 04/25/2019 1543   CHOLHDL 3.9 04/25/2019 1543   CHOLHDL 4.2 CALC 11/17/2007 0844   VLDL 18 11/17/2007 0844   LDLCALC 128 (H) 04/25/2019 1543     Wt Readings from Last 3 Encounters:  09/06/19 254 lb (115.2 kg)  06/30/19 258 lb (117 kg)  04/25/19 253 lb (114.8 kg)       Other studies Reviewed: Additional studies/ records that were reviewed today include: TTE 09/14/17  Review of the above records today demonstrates:  - Left ventricle: The cavity size was normal. Wall thickness was   increased in a pattern of mild LVH. Systolic function was normal.   The estimated ejection fraction was in the range of 55% to 60%.   Wall motion was normal; there were no regional wall motion   abnormalities. Features are consistent with a pseudonormal left   ventricular filling pattern, with concomitant abnormal relaxation   and increased filling pressure (grade 2 diastolic dysfunction).   Doppler parameters are consistent with high ventricular filling   pressure. - Left atrium: The atrium was mildly dilated. - Pulmonary arteries: Systolic pressure was mildly increased. PA   peak pressure: 37 mm Hg (S).  Monitor 11/22/18 personally reviewed SInus rhythm   NO signifcant arrhythmias detected Patient did not appear to have any symptoms while wearing  ASSESSMENT AND PLAN:  1.  Paroxysmal atrial fibrillation: Currently on flecainide and Xarelto.  CHA2DS2-VASc of 3.  He is continued to have episodes of atrial fibrillation despite antiarrhythmic therapy.  Due to that, she would prefer to have ablation instead of adjusting antiarrhythmic medications.  Risks and benefits of ablation were discussed.  Risks include bleeding, tamponade, heart block, stroke, damage surrounding organs.  She understands these risks and has agreed to the procedure.  I have told her that if she has further episodes of atrial fibrillation with rapid rates she can take an extra dose of metoprolol.  2.  OSA: CPAP compliance encouraged  3.  Obesity: Diet and exercise encouraged  Current medicines are reviewed at length with the patient today.   The patient does not have concerns regarding her medicines.  The following changes were made today: None  Labs/ tests ordered today include:  Orders  Placed This  Encounter  Procedures  . CT CARDIAC MORPH/PULM VEIN W/CM&W/O CA SCORE  . Basic metabolic panel  . CBC  . EKG 12-Lead     Disposition:   FU with Romaldo Saville 3 months  Signed, Karman Veney Taylor Leeds, MD  09/06/2019 4:47 PM     [place] [address] [phone] (office) [phone] (fax)

## 2019-09-06 NOTE — Patient Instructions (Signed)
Medication Instructions:  Your physician recommends that you continue on your current medications as directed. Please refer to the Current Medication list given to you today.   *If you need a refill on your cardiac medications before your next appointment, please call your pharmacy*   Lab Work: Pre procedure labs today: BMET & CBC If you have labs (blood work) drawn today and your tests are completely normal, you will receive your results only by: Marland Kitchen MyChart Message (if you have MyChart) OR . A paper copy in the mail If you have any lab test that is abnormal or we need to change your treatment, we will call you to review the results.   Testing/Procedures: Your physician has requested that you have cardiac CT within 7 days PRIOR to your ablation. Cardiac computed tomography (CT) is a painless test that uses an x-ray machine to take clear, detailed pictures of your heart. For further information please visit [email or web address]. Please follow instruction sheet as given.   Your physician has recommended that you have an ablation. Catheter ablation is a medical procedure used to treat some cardiac arrhythmias (irregular heartbeats). During catheter ablation, a long, thin, flexible tube is put into a blood vessel in your groin (upper thigh), or neck. This tube is called an ablation catheter. It is then guided to your heart through the blood vessel. Radio frequency waves destroy small areas of heart tissue where abnormal heartbeats may cause an arrhythmia to start. Please see the instruction sheet given to you today.   Follow-Up: Your physician recommends that you schedule a follow-up appointment in: 4 weeks, after your ablation, with the AFib clinic.  At [facility], you and your health needs are our priority.  As part of our continuing mission to provide you with exceptional heart care, we have created designated Provider Care Teams.  These Care Teams include your primary Cardiologist  (physician) and Advanced Practice Providers (APPs -  Physician Assistants and Nurse Practitioners) who all work together to provide you with the care you need, when you need it.  We recommend signing up for the patient portal called "MyChart".  Sign up information is provided on this After Visit Summary.  MyChart is used to connect with patients for Virtual Visits (Telemedicine).  Patients are able to view lab/test results, encounter notes, upcoming appointments, etc.  Non-urgent messages can be sent to your provider as well.   To learn more about what you can do with MyChart, go to [email or web address].    Your next appointment:   3 month(s)  The format for your next appointment:   In Person  Provider:   Allegra Lai, MD   Thank you for choosing [facility]!!   Trinidad Curet, RN [phone]    Other Instructions  CT INSTRUCTIONS Your cardiac CT will be scheduled at:  [facility] [address] [place], [place] [address] [phone]  Please arrive at the [facility] main entrance of [facility] at __________ on __________ , please arrive 30 minutes prior to test start time. Proceed to the [facility] Radiology Department (first floor) to check-in and test prep.  Please follow these instructions carefully (unless otherwise directed):  On the Night Before the Test: . Be sure to Drink plenty of water. . Do not consume any caffeinated/decaffeinated beverages or chocolate 12 hours prior to your test. . Do not take any antihistamines 12 hours prior to your test. . If you take Metformin do not take 24 hours prior to  test. . If the patient has contrast allergy: ? Patient will need a prescription for Prednisone and very clear instructions (as follows): 1. Prednisone 50 mg - take 13 hours prior to test 2. Take another Prednisone 50 mg 7 hours prior to test 3. Take another Prednisone 50 mg 1 hour prior to test 4. Take Benadryl 50 mg 1 hour prior to  test . Patient must complete all four doses of above prophylactic medications. . Patient will need a ride after test due to Benadryl.  On the Day of the Test: . Drink plenty of water. Do not drink any water within one hour of the test. . Do not eat any food 4 hours prior to the test. . You may take your regular medications prior to the test.  . Take metoprolol (Lopressor) two hours prior to test. . HOLD Furosemide/Hydrochlorothiazide morning of the test. . FEMALES- please wear underwire-free bra if available       After the Test: . Drink plenty of water. . After receiving IV contrast, you may experience a mild flushed feeling. This is normal. . On occasion, you may experience a mild rash up to 24 hours after the test. This is not dangerous. If this occurs, you can take Benadryl 25 mg and increase your fluid intake. . If you experience trouble breathing, this can be serious. If it is severe call 911 IMMEDIATELY. If it is mild, please call our office. . If you take any of these medications: Glipizide/Metformin, Avandament, Glucavance, please do not take 48 hours after completing test unless otherwise instructed.   Once we have confirmed authorization from your insurance company, we will call you to set up a date and time for your test.   For non-scheduling related questions, please contact the cardiac imaging nurse navigator should you have any questions/concerns: Marchia Bond, RN Navigator Cardiac Imaging [facility] [phone] office  For scheduling needs, including cancellations and rescheduling, please call [phone].       Electrophysiology/Ablation Procedure Instructions   You are scheduled for a(n)  ablation on 09/21/2019 with Dr. Allegra Lai.   1.   Pre procedure testing-             A.  LAB WORK --- On 09/06/2019  for your pre procedure blood work.                 B. COVID TEST-- On 09/19/2019 @ 11:00 am - You will go to [facility] hospital  ([place]) for your Covid testing.   This is a drive thru test site.  There will be multiple testing areas.  Be sure to share with the first checkpoint that you are there for pre-procedure/surgery testing. This will put you into the right (yellow) lane that leads to the PAT testing team. Stay in your car and the nurse team will come to your car to test you.  After you are tested please go home and self quarantine until the day of your procedure.     2. On the day of your procedure 09/21/2019 you will go to [facility] [phone] [address]) at 6:30 am.  Dennis Bast will go to the main entrance A [employer]) and enter where the [employer] are.  Your driver will drop you off and you will head down the hallway to ADMITTING.  You may have one support person come in to the hospital with you.  They will be asked to wait  in the waiting room.   3.   Do not eat or drink after midnight prior to your procedure.   4.   Do NOT take any medications the morning of your procedure.   5.  Plan for an overnight stay.  If you use your phone frequently bring your phone charger.   6. You will follow up with the AFIB clinic 4 weeks after your procedure.  You will follow up with Dr. Curt Bears  3 months after your procedure.  These appointments will be made for you.   * If you have ANY questions please call the office [phone] and ask for Emarie Paul RN or send me a MyChart message   * Occasionally, EP Studies and ablations can become lengthy.  Please make your family aware of this before your procedure starts.  Average time ranges from 2-8 hours for EP studies/ablations.  Your physician will call your family after the procedure with the results.                                    Cardiac Ablation Cardiac ablation is a procedure to disable (ablate) a small amount of heart tissue in very specific places. The heart has many electrical connections. Sometimes these connections are abnormal and  can cause the heart to beat very fast or irregularly. Ablating some of the problem areas can improve the heart rhythm or return it to normal. Ablation may be done for people who:  Have Wolff-Parkinson-White syndrome.  Have fast heart rhythms (tachycardia).  Have taken medicines for an abnormal heart rhythm (arrhythmia) that were not effective or caused side effects.  Have a high-risk heartbeat that may be life-threatening. During the procedure, a small incision is made in the neck or the groin, and a long, thin, flexible tube (catheter) is inserted into the incision and moved to the heart. Small devices (electrodes) on the tip of the catheter will send out electrical currents. A type of X-ray (fluoroscopy) will be used to help guide the catheter and to provide images of the heart. Tell a health care provider about:  Any allergies you have.  All medicines you are taking, including vitamins, herbs, eye drops, creams, and over-the-counter medicines.  Any problems you or family members have had with anesthetic medicines.  Any blood disorders you have.  Any surgeries you have had.  Any medical conditions you have, such as kidney failure.  Whether you are pregnant or may be pregnant. What are the risks? Generally, this is a safe procedure. However, problems may occur, including:  Infection.  Bruising and bleeding at the catheter insertion site.  Bleeding into the chest, especially into the sac that surrounds the heart. This is a serious complication.  Stroke or blood clots.  Damage to other structures or organs.  Allergic reaction to medicines or dyes.  Need for a permanent pacemaker if the normal electrical system is damaged. A pacemaker is a small computer that sends electrical signals to the heart and helps your heart beat normally.  The procedure not being fully effective. This may not be recognized until months later. Repeat ablation procedures are sometimes required. What  happens before the procedure?  Follow instructions from your health care provider about eating or drinking restrictions.  Ask your health care provider about: ? Changing or stopping your regular medicines. This is especially important if you are taking diabetes medicines or blood thinners. ? Taking  medicines such as aspirin and ibuprofen. These medicines can thin your blood. Do not take these medicines before your procedure if your health care provider instructs you not to.  Plan to have someone take you home from the hospital or clinic.  If you will be going home right after the procedure, plan to have someone with you for 24 hours. What happens during the procedure?  To lower your risk of infection: ? Your health care team will wash or sanitize their hands. ? Your skin will be washed with soap. ? Hair may be removed from the incision area.  An IV tube will be inserted into one of your veins.  You will be given a medicine to help you relax (sedative).  The skin on your neck or groin will be numbed.  An incision will be made in your neck or your groin.  A needle will be inserted through the incision and into a large vein in your neck or groin.  A catheter will be inserted into the needle and moved to your heart.  Dye may be injected through the catheter to help your surgeon see the area of the heart that needs treatment.  Electrical currents will be sent from the catheter to ablate heart tissue in desired areas. There are three types of energy that may be used to ablate heart tissue: ? Heat (radiofrequency energy). ? Laser energy. ? Extreme cold (cryoablation).  When the necessary tissue has been ablated, the catheter will be removed.  Pressure will be held on the catheter insertion area to prevent excessive bleeding.  A bandage (dressing) will be placed over the catheter insertion area. The procedure may vary among health care providers and hospitals. What happens after  the procedure?  Your blood pressure, heart rate, breathing rate, and blood oxygen level will be monitored until the medicines you were given have worn off.  Your catheter insertion area will be monitored for bleeding. You will need to lie still for a few hours to ensure that you do not bleed from the catheter insertion area.  Do not drive for 24 hours or as long as directed by your health care provider. Summary  Cardiac ablation is a procedure to disable (ablate) a small amount of heart tissue in very specific places. Ablating some of the problem areas can improve the heart rhythm or return it to normal.  During the procedure, electrical currents will be sent from the catheter to ablate heart tissue in desired areas. This information is not intended to replace advice given to you by your health care provider. Make sure you discuss any questions you have with your health care provider. Document Revised: 11/16/2017 Document Reviewed: 04/14/2016 Elsevier Patient Education  [employer].

## 2019-09-08 NOTE — Telephone Encounter (Signed)
Envelope with [employer] forms received.  Placed in Dr. Alan Ripper mailbox until next day in clinic 09/12/19.

## 2019-09-08 NOTE — Telephone Encounter (Signed)
Forwarded information from PharmD to the patient. Routing to Dr. Harrington Challenger to inform as well.

## 2019-09-09 ENCOUNTER — Telehealth (HOSPITAL_COMMUNITY): Payer: Self-pay | Admitting: Physician Assistant

## 2019-09-09 DIAGNOSIS — M25511 Pain in right shoulder: Secondary | ICD-10-CM | POA: Diagnosis not present

## 2019-09-09 DIAGNOSIS — M19111 Post-traumatic osteoarthritis, right shoulder: Secondary | ICD-10-CM | POA: Diagnosis not present

## 2019-09-09 NOTE — Telephone Encounter (Signed)
Called and left message for patient to call back.  Need to schedule 4 wk f/u after ablation on 09/21/19 per Dr. Curt Bears.

## 2019-09-12 NOTE — Telephone Encounter (Signed)
Patient returned my call.  She is aware of appt 10/19/19 with Adline Peals, PA at 11 am.

## 2019-09-12 NOTE — Telephone Encounter (Signed)
Agree--  No data for CoQ 10    It will not hurt, but no data that it will help

## 2019-09-13 ENCOUNTER — Telehealth: Payer: Self-pay

## 2019-09-13 NOTE — Telephone Encounter (Signed)
[employer] patient assistance forms faxed to [phone]

## 2019-09-13 NOTE — Telephone Encounter (Signed)
I completed the provider page of a [employer] Pt Asst Application that was emailed to me from the office (Dr. Harrington Challenger has already signed it). I have scanned and emailed all (entire application w/pts part) to Dr Alan Ripper nurse so she can fax to [employer] Pt Asst Program at the fax number written on the cover letter included.

## 2019-09-15 ENCOUNTER — Ambulatory Visit (HOSPITAL_COMMUNITY)
Admission: RE | Admit: 2019-09-15 | Discharge: 2019-09-15 | Disposition: A | Discharge status: Home / Self Care | Payer: PPO | Source: Ambulatory Visit | Attending: Cardiology | Admitting: Cardiology

## 2019-09-15 ENCOUNTER — Other Ambulatory Visit: Payer: Self-pay

## 2019-09-15 DIAGNOSIS — I4891 Unspecified atrial fibrillation: Secondary | ICD-10-CM | POA: Insufficient documentation

## 2019-09-15 MED ORDER — IOHEXOL 350 MG/ML SOLN
100.0000 mL | Freq: Once | INTRAVENOUS | Status: AC | PRN
  Administered 2019-09-15: 100 mL via INTRAVENOUS

## 2019-09-15 MED ORDER — NITROGLYCERIN 0.4 MG SL SUBL: 0.8000 mg | SUBLINGUAL_TABLET | Freq: Once | SUBLINGUAL | Status: DC

## 2019-09-19 ENCOUNTER — Ambulatory Visit: Payer: PPO | Admitting: Cardiology

## 2019-09-19 ENCOUNTER — Other Ambulatory Visit (HOSPITAL_COMMUNITY)
Admission: RE | Admit: 2019-09-19 | Discharge: 2019-09-19 | Disposition: A | Discharge status: Home / Self Care | Payer: PPO | Source: Ambulatory Visit | Attending: Cardiology | Admitting: Cardiology

## 2019-09-19 DIAGNOSIS — Z20822 Contact with and (suspected) exposure to covid-19: Secondary | ICD-10-CM | POA: Diagnosis not present

## 2019-09-19 DIAGNOSIS — Z01812 Encounter for preprocedural laboratory examination: Secondary | ICD-10-CM | POA: Diagnosis not present

## 2019-09-19 LAB — SARS CORONAVIRUS 2 (TAT 6-24 HRS): SARS Coronavirus 2: NEGATIVE

## 2019-09-21 ENCOUNTER — Ambulatory Visit (HOSPITAL_COMMUNITY): Payer: PPO | Admitting: Anesthesiology

## 2019-09-21 ENCOUNTER — Ambulatory Visit (HOSPITAL_COMMUNITY)
Admission: RE | Admit: 2019-09-21 | Discharge: 2019-09-21 | Disposition: A | Discharge status: Home / Self Care | Payer: PPO | Attending: Cardiology | Admitting: Cardiology

## 2019-09-21 ENCOUNTER — Other Ambulatory Visit: Payer: Self-pay

## 2019-09-21 ENCOUNTER — Ambulatory Visit (HOSPITAL_COMMUNITY)
Admission: RE | Disposition: A | Discharge status: Home / Self Care | Payer: Self-pay | Source: Home / Self Care | Attending: Cardiology

## 2019-09-21 DIAGNOSIS — G4733 Obstructive sleep apnea (adult) (pediatric): Secondary | ICD-10-CM | POA: Insufficient documentation

## 2019-09-21 DIAGNOSIS — E039 Hypothyroidism, unspecified: Secondary | ICD-10-CM | POA: Insufficient documentation

## 2019-09-21 DIAGNOSIS — Z7901 Long term (current) use of anticoagulants: Secondary | ICD-10-CM | POA: Insufficient documentation

## 2019-09-21 DIAGNOSIS — F419 Anxiety disorder, unspecified: Secondary | ICD-10-CM | POA: Insufficient documentation

## 2019-09-21 DIAGNOSIS — K219 Gastro-esophageal reflux disease without esophagitis: Secondary | ICD-10-CM | POA: Insufficient documentation

## 2019-09-21 DIAGNOSIS — I480 Paroxysmal atrial fibrillation: Secondary | ICD-10-CM | POA: Diagnosis not present

## 2019-09-21 DIAGNOSIS — Z6841 Body Mass Index (BMI) 40.0 and over, adult: Secondary | ICD-10-CM | POA: Insufficient documentation

## 2019-09-21 DIAGNOSIS — J449 Chronic obstructive pulmonary disease, unspecified: Secondary | ICD-10-CM | POA: Insufficient documentation

## 2019-09-21 DIAGNOSIS — E669 Obesity, unspecified: Secondary | ICD-10-CM | POA: Diagnosis not present

## 2019-09-21 DIAGNOSIS — I4891 Unspecified atrial fibrillation: Secondary | ICD-10-CM | POA: Diagnosis not present

## 2019-09-21 DIAGNOSIS — F329 Major depressive disorder, single episode, unspecified: Secondary | ICD-10-CM | POA: Insufficient documentation

## 2019-09-21 DIAGNOSIS — I10 Essential (primary) hypertension: Secondary | ICD-10-CM | POA: Diagnosis not present

## 2019-09-21 DIAGNOSIS — E871 Hypo-osmolality and hyponatremia: Secondary | ICD-10-CM | POA: Diagnosis not present

## 2019-09-21 DIAGNOSIS — Z79890 Hormone replacement therapy (postmenopausal): Secondary | ICD-10-CM | POA: Insufficient documentation

## 2019-09-21 DIAGNOSIS — E119 Type 2 diabetes mellitus without complications: Secondary | ICD-10-CM | POA: Diagnosis not present

## 2019-09-21 DIAGNOSIS — Z79899 Other long term (current) drug therapy: Secondary | ICD-10-CM | POA: Insufficient documentation

## 2019-09-21 HISTORY — PX: ATRIAL FIBRILLATION ABLATION: EP1191

## 2019-09-21 LAB — BASIC METABOLIC PANEL
Anion gap: 13 (ref 5–15)
BUN: 19 mg/dL (ref 8–23)
CO2: 26 mmol/L (ref 22–32)
Calcium: 9 mg/dL (ref 8.9–10.3)
Chloride: 99 mmol/L (ref 98–111)
Creatinine, Ser: 1.11 mg/dL — ABNORMAL HIGH (ref 0.44–1.00)
GFR calc Af Amer: 60 mL/min — ABNORMAL LOW (ref >60)
GFR calc non Af Amer: 52 mL/min — ABNORMAL LOW (ref >60)
Glucose, Bld: 108 mg/dL — ABNORMAL HIGH (ref 70–99)
Potassium: 3.5 mmol/L (ref 3.5–5.1)
Sodium: 138 mmol/L (ref 135–145)

## 2019-09-21 LAB — CBC
HCT: 45.4 % (ref 36.0–46.0)
Hemoglobin: 14.1 g/dL (ref 12.0–15.0)
MCH: 29.1 pg (ref 26.0–34.0)
MCHC: 31.1 g/dL (ref 30.0–36.0)
MCV: 93.6 fL (ref 80.0–100.0)
Platelets: 250 10*3/uL (ref 150–400)
RBC: 4.85 MIL/uL (ref 3.87–5.11)
RDW: 13.1 % (ref 11.5–15.5)
WBC: 9.6 10*3/uL (ref 4.0–10.5)
nRBC: 0 % (ref 0.0–0.2)

## 2019-09-21 LAB — POCT ACTIVATED CLOTTING TIME
Activated Clotting Time: 263 seconds
Activated Clotting Time: 285 seconds
Activated Clotting Time: 334 seconds

## 2019-09-21 SURGERY — ATRIAL FIBRILLATION ABLATION
Anesthesia: General

## 2019-09-21 MED ORDER — HEPARIN (PORCINE) IN NACL 1000-0.9 UT/500ML-% IV SOLN
INTRAVENOUS | Status: AC
  Filled 2019-09-21: qty 500

## 2019-09-21 MED ORDER — OXYCODONE HCL 5 MG PO TABS
15.0000 mg | ORAL_TABLET | Freq: Four times a day (QID) | ORAL | Status: DC
  Administered 2019-09-21: 15 mg via ORAL

## 2019-09-21 MED ORDER — PHENYLEPHRINE HCL-NACL 10-0.9 MG/250ML-% IV SOLN
INTRAVENOUS | Status: DC | PRN
  Administered 2019-09-21: 25 ug/min via INTRAVENOUS

## 2019-09-21 MED ORDER — SUCCINYLCHOLINE CHLORIDE 200 MG/10ML IV SOSY
PREFILLED_SYRINGE | INTRAVENOUS | Status: DC | PRN
  Administered 2019-09-21: 140 mg via INTRAVENOUS

## 2019-09-21 MED ORDER — SODIUM CHLORIDE 0.9 % IV SOLN: 250.0000 mL | INTRAVENOUS | Status: DC | PRN

## 2019-09-21 MED ORDER — SUGAMMADEX SODIUM 200 MG/2ML IV SOLN
INTRAVENOUS | Status: DC | PRN
  Administered 2019-09-21: 200 mg via INTRAVENOUS

## 2019-09-21 MED ORDER — HEPARIN SODIUM (PORCINE) 1000 UNIT/ML IJ SOLN
INTRAMUSCULAR | Status: AC
  Filled 2019-09-21: qty 1

## 2019-09-21 MED ORDER — DOBUTAMINE IN D5W 4-5 MG/ML-% IV SOLN
INTRAVENOUS | Status: DC | PRN
  Administered 2019-09-21: 20 ug/kg/min via INTRAVENOUS

## 2019-09-21 MED ORDER — SODIUM CHLORIDE 0.9 % IV SOLN: INTRAVENOUS | Status: DC

## 2019-09-21 MED ORDER — PROTAMINE SULFATE 10 MG/ML IV SOLN
INTRAVENOUS | Status: DC | PRN
  Administered 2019-09-21: 10 mg via INTRAVENOUS
  Administered 2019-09-21 (×2): 20 mg via INTRAVENOUS

## 2019-09-21 MED ORDER — OXYCODONE HCL 5 MG PO TABS
ORAL_TABLET | ORAL | Status: AC
  Filled 2019-09-21: qty 3

## 2019-09-21 MED ORDER — MIDAZOLAM HCL 5 MG/5ML IJ SOLN
INTRAMUSCULAR | Status: DC | PRN
  Administered 2019-09-21: 2 mg via INTRAVENOUS

## 2019-09-21 MED ORDER — FENTANYL CITRATE (PF) 100 MCG/2ML IJ SOLN
INTRAMUSCULAR | Status: AC
  Filled 2019-09-21: qty 2

## 2019-09-21 MED ORDER — ROCURONIUM BROMIDE 50 MG/5ML IV SOSY
PREFILLED_SYRINGE | INTRAVENOUS | Status: DC | PRN
  Administered 2019-09-21: 50 mg via INTRAVENOUS

## 2019-09-21 MED ORDER — ONDANSETRON HCL 4 MG/2ML IJ SOLN: 4.0000 mg | Freq: Four times a day (QID) | INTRAMUSCULAR | Status: DC | PRN

## 2019-09-21 MED ORDER — LIDOCAINE 2% (20 MG/ML) 5 ML SYRINGE
INTRAMUSCULAR | Status: DC | PRN
  Administered 2019-09-21: 60 mg via INTRAVENOUS

## 2019-09-21 MED ORDER — DEXAMETHASONE SODIUM PHOSPHATE 10 MG/ML IJ SOLN
INTRAMUSCULAR | Status: DC | PRN
  Administered 2019-09-21: 5 mg via INTRAVENOUS

## 2019-09-21 MED ORDER — FENTANYL CITRATE (PF) 250 MCG/5ML IJ SOLN
INTRAMUSCULAR | Status: DC | PRN
  Administered 2019-09-21: 100 ug via INTRAVENOUS

## 2019-09-21 MED ORDER — SODIUM CHLORIDE 0.9% FLUSH: 3.0000 mL | INTRAVENOUS | Status: DC | PRN

## 2019-09-21 MED ORDER — DOBUTAMINE IN D5W 4-5 MG/ML-% IV SOLN
INTRAVENOUS | Status: AC
  Filled 2019-09-21: qty 250

## 2019-09-21 MED ORDER — ACETAMINOPHEN 325 MG PO TABS
650.0000 mg | ORAL_TABLET | ORAL | Status: DC | PRN
  Administered 2019-09-21: 650 mg via ORAL
  Filled 2019-09-21 (×3): qty 2

## 2019-09-21 MED ORDER — ONDANSETRON HCL 4 MG/2ML IJ SOLN
INTRAMUSCULAR | Status: DC | PRN
  Administered 2019-09-21: 4 mg via INTRAVENOUS

## 2019-09-21 MED ORDER — HEPARIN (PORCINE) IN NACL 2000-0.9 UNIT/L-% IV SOLN
INTRAVENOUS | Status: DC | PRN
  Administered 2019-09-21: 1000 mL

## 2019-09-21 MED ORDER — FENTANYL CITRATE (PF) 100 MCG/2ML IJ SOLN
25.0000 ug | Freq: Once | INTRAMUSCULAR | Status: AC
  Administered 2019-09-21: 25 ug via INTRAVENOUS

## 2019-09-21 MED ORDER — HEPARIN SODIUM (PORCINE) 1000 UNIT/ML IJ SOLN
INTRAMUSCULAR | Status: DC | PRN
  Administered 2019-09-21 (×2): 5000 [IU] via INTRAVENOUS
  Administered 2019-09-21: 2000 [IU] via INTRAVENOUS
  Administered 2019-09-21: 15000 [IU] via INTRAVENOUS

## 2019-09-21 MED ORDER — PHENYLEPHRINE HCL (PRESSORS) 10 MG/ML IV SOLN
INTRAVENOUS | Status: DC | PRN
  Administered 2019-09-21 (×3): 80 ug via INTRAVENOUS

## 2019-09-21 MED ORDER — PROPOFOL 10 MG/ML IV BOLUS
INTRAVENOUS | Status: DC | PRN
  Administered 2019-09-21: 150 mg via INTRAVENOUS
  Administered 2019-09-21: 50 mg via INTRAVENOUS

## 2019-09-21 SURGICAL SUPPLY — 25 items
BAG SNAP BAND KOVER 36X36 (MISCELLANEOUS) ×1 IMPLANT
BLANKET WARM UNDERBOD FULL ACC (MISCELLANEOUS) ×2 IMPLANT
CATH 8FR REPROCESSED SOUNDSTAR (CATHETERS) ×2 IMPLANT
CATH 8FR SOUNDSTAR REPROCESSED (CATHETERS) IMPLANT
CATH MAPPNG PENTARAY F 2-6-2MM (CATHETERS) IMPLANT
CATH SMTCH THERMOCOOL SF DF (CATHETERS) ×1 IMPLANT
CATH SOUNDSTAR ECO 8FR (CATHETERS) ×1 IMPLANT
CATH WEBSTER BI DIR CS D-F CRV (CATHETERS) ×1 IMPLANT
COVER SWIFTLINK CONNECTOR (BAG) ×2 IMPLANT
DEVICE CLOSURE PERCLS PRGLD 6F (VASCULAR PRODUCTS) IMPLANT
PACK EP LATEX FREE (CUSTOM PROCEDURE TRAY) ×2
PACK EP LF (CUSTOM PROCEDURE TRAY) ×1 IMPLANT
PAD PRO RADIOLUCENT 2001M-C (PAD) ×2 IMPLANT
PATCH CARTO3 (PAD) ×1 IMPLANT
PENTARAY F 2-6-2MM (CATHETERS) ×2
PERCLOSE PROGLIDE 6F (VASCULAR PRODUCTS) ×8
SHEATH BAYLIS SUREFLEX  M 8.5 (SHEATH) ×2
SHEATH BAYLIS SUREFLEX M 8.5 (SHEATH) IMPLANT
SHEATH BAYLIS TRANSSEPTAL 98CM (NEEDLE) ×1 IMPLANT
SHEATH CARTO VIZIGO SM CVD (SHEATH) ×1 IMPLANT
SHEATH PINNACLE 7F 10CM (SHEATH) ×1 IMPLANT
SHEATH PINNACLE 8F 10CM (SHEATH) ×2 IMPLANT
SHEATH PINNACLE 9F 10CM (SHEATH) ×1 IMPLANT
SHEATH PROBE COVER 6X72 (BAG) ×1 IMPLANT
TUBING SMART ABLATE COOLFLOW (TUBING) ×1 IMPLANT

## 2019-09-21 NOTE — Anesthesia Preprocedure Evaluation (Signed)
Anesthesia Evaluation  Patient identified by MRN, date of birth, ID band Patient awake    Reviewed: Allergy & Precautions, NPO status , Patient's Chart, lab work & pertinent test results  Airway Mallampati: II  TM Distance: >3 FB Neck ROM: Full    Dental no notable dental hx.    Pulmonary COPD, former smoker,    Pulmonary exam normal breath sounds clear to auscultation       Cardiovascular hypertension, Normal cardiovascular exam+ dysrhythmias Atrial Fibrillation  Rhythm:Regular Rate:Normal     Neuro/Psych Anxiety Depression negative neurological ROS  negative psych ROS   GI/Hepatic Neg liver ROS, GERD  ,  Endo/Other  diabetesHypothyroidism Morbid obesity  Renal/GU negative Renal ROS  negative genitourinary   Musculoskeletal  (+) Arthritis , Osteoarthritis,    Abdominal (+) + obese,   Peds negative pediatric ROS (+)  Hematology negative hematology ROS (+)   Anesthesia Other Findings   Reproductive/Obstetrics negative OB ROS                             Anesthesia Physical Anesthesia Plan  ASA: III  Anesthesia Plan: General   Post-op Pain Management:    Induction: Intravenous  PONV Risk Score and Plan: 3 and Ondansetron, Dexamethasone, Midazolam and Treatment may vary due to age or medical condition  Airway Management Planned: Oral ETT  Additional Equipment:   Intra-op Plan:   Post-operative Plan: Extubation in OR  Informed Consent: I have reviewed the patients History and Physical, chart, labs and discussed the procedure including the risks, benefits and alternatives for the proposed anesthesia with the patient or authorized representative who has indicated his/her understanding and acceptance.     Dental advisory given  Plan Discussed with: CRNA  Anesthesia Plan Comments:         Anesthesia Quick Evaluation

## 2019-09-21 NOTE — Discharge Instructions (Signed)
Post procedure care instructions No driving for 4 days. No lifting over 5 lbs for 1 week. No vigorous or sexual activity for 1 week. You may return to work/your usual activities on 09/28/2019. Keep procedure site clean & dry. If you notice increased pain, swelling, bleeding or pus, call/return!  You may shower, but no soaking baths/hot tubs/pools for 1 week.     Cardiac Ablation, Care After  This sheet gives you information about how to care for yourself after your procedure. Your health care provider may also give you more specific instructions. If you have problems or questions, contact your health care provider. What can I expect after the procedure? After the procedure, it is common to have:  Bruising around your puncture site.  Tenderness around your puncture site.  Skipped heartbeats.  Tiredness (fatigue).  Follow these instructions at home: Puncture site care   Follow instructions from your health care provider about how to take care of your puncture site. Make sure you: ? If present, leave stitches (sutures), skin glue, or adhesive strips in place. These skin closures may need to stay in place for up to 2 weeks. If adhesive strip edges start to loosen and curl up, you may trim the loose edges. Do not remove adhesive strips completely unless your health care provider tells you to do that.  Check your puncture site every day for signs of infection. Check for: ? Redness, swelling, or pain. ? Fluid or blood. If your puncture site starts to bleed, lie down on your back, apply firm pressure to the area, and contact your health care provider. ? Warmth. ? Pus or a bad smell. Driving  Do not drive for at least 4 days after your procedure or however long your health care provider recommends. (Do not resume driving if you have previously been instructed not to drive for other health reasons.)  Do not drive or use heavy machinery while taking prescription pain  medicine. Activity  Avoid activities that take a lot of effort for at least 7 days after your procedure.  Do not lift anything that is heavier than 5 lb (4.5 kg) for one week.   No sexual activity for 1 week.   Return to your normal activities as told by your health care provider. Ask your health care provider what activities are safe for you. General instructions  Take over-the-counter and prescription medicines only as told by your health care provider.  Do not use any products that contain nicotine or tobacco, such as cigarettes and e-cigarettes. If you need help quitting, ask your health care provider.  You may shower after 24 hours, but Do not take baths, swim, or use a hot tub for 1 week.   Do not drink alcohol for 24 hours after your procedure.  Keep all follow-up visits as told by your health care provider. This is important. Contact a health care provider if:  You have redness, mild swelling, or pain around your puncture site.  You have fluid or blood coming from your puncture site that stops after applying firm pressure to the area.  Your puncture site feels warm to the touch.  You have pus or a bad smell coming from your puncture site.  You have a fever.  You have chest pain or discomfort that spreads to your neck, jaw, or arm.  You are sweating a lot.  You feel nauseous.  You have a fast or irregular heartbeat.  You have shortness of breath.  You are dizzy  or light-headed and feel the need to lie down.  You have pain or numbness in the arm or leg closest to your puncture site. Get help right away if:  Your puncture site suddenly swells.  Your puncture site is bleeding and the bleeding does not stop after applying firm pressure to the area. These symptoms may represent a serious problem that is an emergency. Do not wait to see if the symptoms will go away. Get medical help right away. Call your local emergency services (911 in the [place] Do not drive  yourself to the hospital. Summary  After the procedure, it is normal to have bruising and tenderness at the puncture site in your groin, neck, or forearm.  Check your puncture site every day for signs of infection.  Get help right away if your puncture site is bleeding and the bleeding does not stop after applying firm pressure to the area. This is a medical emergency. This information is not intended to replace advice given to you by your health care provider. Make sure you discuss any questions you have with your health care provider.

## 2019-09-21 NOTE — Anesthesia Postprocedure Evaluation (Signed)
Anesthesia Post Note  Patient: Taylor [name]  Procedure(s) Performed: ATRIAL FIBRILLATION ABLATION (N/A )     Patient location during evaluation: PACU Anesthesia Type: General Level of consciousness: awake and alert Pain management: pain level controlled Vital Signs Assessment: post-procedure vital signs reviewed and stable Respiratory status: spontaneous breathing, nonlabored ventilation and respiratory function stable Cardiovascular status: blood pressure returned to baseline and stable Postop Assessment: no apparent nausea or vomiting Anesthetic complications: no    Last Vitals:  Vitals:   09/21/19 1240 09/21/19 1245  BP: 140/67 136/60  Pulse: 68 67  Resp: 17 (!) 26  Temp:    SpO2: 97% 97%    Last Pain:  Vitals:   09/21/19 1253  TempSrc:   PainSc: 10-Worst pain ever                 Lynda Rainwater

## 2019-09-21 NOTE — H&P (Signed)
Taylor [name] has presented today for surgery, with the diagnosis of atrial fibrillation.  The various methods of treatment have been discussed with the patient and family. After consideration of risks, benefits and other options for treatment, the patient has consented to  Procedure(s): Catheter ablation as a surgical intervention .  Risks include but not limited to bleeding, tamponade, heart block, stroke, damage to surrounding organs, among others. The patient's history has been reviewed, patient examined, no change in status, stable for surgery.  I have reviewed the patient's chart and labs.  Questions were answered to the patient's satisfaction.    Deshon Koslowski Curt Bears, MD 09/21/2019 7:59 AM

## 2019-09-21 NOTE — Transfer of Care (Signed)
Immediate Anesthesia Transfer of Care Note  Patient: Taylor [name]  Procedure(s) Performed: ATRIAL FIBRILLATION ABLATION (N/A )  Patient Location: Cath Lab  Anesthesia Type:General  Level of Consciousness: awake, alert , oriented, patient cooperative and responds to stimulation  Airway & Oxygen Therapy: Patient Spontanous Breathing and Patient connected to face mask oxygen  Post-op Assessment: Report given to RN and Post -op Vital signs reviewed and stable  Post vital signs: Reviewed and stable  Last Vitals:  Vitals Value Taken Time  BP    Temp    Pulse    Resp    SpO2      Last Pain:  Vitals:   09/21/19 0657  TempSrc:   PainSc: 6       Patients Stated Pain Goal: 3 ([id number])  Complications: No apparent anesthesia complications

## 2019-09-21 NOTE — Anesthesia Procedure Notes (Signed)
Procedure Name: Intubation Date/Time: 09/21/2019 8:44 AM Performed by: Glynda Jaeger, CRNA Pre-anesthesia Checklist: Patient identified, Patient being monitored, Timeout performed, Emergency Drugs available and Suction available Patient Re-evaluated:Patient Re-evaluated prior to induction Oxygen Delivery Method: Circle System Utilized Preoxygenation: Pre-oxygenation with 100% oxygen Induction Type: IV induction Ventilation: Mask ventilation without difficulty and Oral airway inserted - appropriate to patient size Laryngoscope Size: Mac and 4 Grade View: Grade III Tube type: Oral Tube size: 7.5 mm Number of attempts: 1 Airway Equipment and Method: Bougie stylet Placement Confirmation: ETT inserted through vocal cords under direct vision,  positive ETCO2 and breath sounds checked- equal and bilateral Secured at: 21 cm Tube secured with: Tape Dental Injury: Teeth and Oropharynx as per pre-operative assessment

## 2019-09-21 NOTE — Progress Notes (Signed)
Denies diabetes. Refused CBG

## 2019-09-22 MED FILL — Heparin Sod (Porcine)-NaCl IV Soln 1000 Unit/500ML-0.9%: INTRAVENOUS | Qty: 1500 | Status: AC

## 2019-09-26 ENCOUNTER — Other Ambulatory Visit: Payer: Self-pay

## 2019-09-26 MED ORDER — FLECAINIDE ACETATE 100 MG PO TABS: 100.0000 mg | ORAL_TABLET | Freq: Two times a day (BID) | ORAL | 7 refills | Status: DC

## 2019-09-27 DIAGNOSIS — G4733 Obstructive sleep apnea (adult) (pediatric): Secondary | ICD-10-CM | POA: Diagnosis not present

## 2019-09-30 ENCOUNTER — Telehealth: Payer: Self-pay

## 2019-09-30 NOTE — Telephone Encounter (Signed)
**Note De-Identified Taylor [name] Obfuscation** Please see [facility] Message. The pt states that [employer] is telling her they did not receive her application.

## 2019-09-30 NOTE — Telephone Encounter (Signed)
Refaxed Taylor [name] patient assistance forms to [phone] at this time.  Notified patient they have been resent

## 2019-09-30 NOTE — Telephone Encounter (Signed)
Per [facility] message that the pt sent [place] today I called her. She states that [employer] pt asst program advised her that they never received her application when she called to check the progress of her application today.  I have sent a message to Dr Alan Ripper nurse asking her to re-fax the pts application to [employer] Pt Asst  today (if posible).  The pt is advised that we are leaving her 2 bottles of Xarelto 20 mg samples at our screening table in the downstairs lobby at Dr Alan Ripper office in [place] as she states that she only has 3 Xarelto tablets left at this time.Marland Kitchen

## 2019-09-30 NOTE — Telephone Encounter (Signed)
Refaxed Taylor [name] patient assistance forms to [phone] at this time.  Notified patient they have been resent.

## 2019-10-03 DIAGNOSIS — F329 Major depressive disorder, single episode, unspecified: Secondary | ICD-10-CM | POA: Diagnosis not present

## 2019-10-05 ENCOUNTER — Other Ambulatory Visit: Payer: Self-pay

## 2019-10-05 MED ORDER — RIVAROXABAN 20 MG PO TABS: 20.0000 mg | ORAL_TABLET | Freq: Every day | ORAL | 3 refills | Status: DC

## 2019-10-06 NOTE — Telephone Encounter (Signed)
New prescription faxed to [facility]. [phone]

## 2019-10-10 NOTE — Telephone Encounter (Signed)
**Note De-Identified Taylor [name] Obfuscation** Letter received from [place] pt asst program stating that they denied the pt asst with her Xarelto. Reason: The pt has not met the programs income requirements.  The letter states that they have notified the pt of this denial as well.

## 2019-10-12 DIAGNOSIS — M47816 Spondylosis without myelopathy or radiculopathy, lumbar region: Secondary | ICD-10-CM | POA: Diagnosis not present

## 2019-10-12 DIAGNOSIS — M75121 Complete rotator cuff tear or rupture of right shoulder, not specified as traumatic: Secondary | ICD-10-CM | POA: Diagnosis not present

## 2019-10-12 DIAGNOSIS — G894 Chronic pain syndrome: Secondary | ICD-10-CM | POA: Diagnosis not present

## 2019-10-12 DIAGNOSIS — M150 Primary generalized (osteo)arthritis: Secondary | ICD-10-CM | POA: Diagnosis not present

## 2019-10-12 NOTE — Telephone Encounter (Addendum)
[facility] message received from the pt today:  Hi. I talked to [facility] and [place] last Friday. They told me that they have all the information they need for my application for help paying for Xarelto. You should hear their decision in 5 to 10 business days. He told me they will fax you with their decision first and mail me the information on the same day which could add another 7 to 10 days. I checked with my pharmacy and my cost would still be $120.00 per month.   I have 6 pills which should get me through Monday.  Thanks for your help. Hopefully we'll hear something soon, Taylor [name]  I did reply as follows:  Ms, [name],  We received a denial letter from Wynetta Emery and [place] Patient Asst program on Monday of this week stating that they denied you assistance with your Xarelto because you have not met program requirements and indicated that you have not met the income requirements.  If you are paying $120 for a 90 day supply of Xarelto this is the normal cost with insurance coverage.  We can no longer give you samples of Xarelto as our samples are meant for patients who are newly starting on Xarelto to try prior to purchasing.  If you cannot afford Xarelto please contact our office to let us know and we can discuss you switching to Warfarin which is the only generic anticoagulant on the market at this time. Thank you,  Mardene Celeste "[name]" Via, LPN (Patient Care Advocate)

## 2019-10-14 DIAGNOSIS — M21621 Bunionette of right foot: Secondary | ICD-10-CM | POA: Diagnosis not present

## 2019-10-14 DIAGNOSIS — R7303 Prediabetes: Secondary | ICD-10-CM | POA: Diagnosis not present

## 2019-10-14 DIAGNOSIS — L97512 Non-pressure chronic ulcer of other part of right foot with fat layer exposed: Secondary | ICD-10-CM | POA: Diagnosis not present

## 2019-10-14 DIAGNOSIS — M2011 Hallux valgus (acquired), right foot: Secondary | ICD-10-CM | POA: Diagnosis not present

## 2019-10-18 NOTE — Telephone Encounter (Signed)
Pt took last Xarelto DIfficulty paying for "Donut hole" Has appt tomorrow in afib clinic   Keep appt    WIll forward this   Decision needs to be made 1.  Switch to coumadin or 2  Samples or 3  ASA Had ablation in APril 2021

## 2019-10-18 NOTE — Progress Notes (Signed)
Primary Care Physician: Larene Beach, MD Primary Cardiologist: Dr Harrington Challenger Primary Electrophysiologist: Dr Curt Bears Referring Physician: Dr Maryjo Rochester is a 69 y.o. female with a history of paroxysmal atrial fibrillation, OSA, COPD, DM, and hypothyroidism who presents for follow up in the [facility].  The patient was initially diagnosed with atrial fibrillation in 2009 in the setting of an URI. Patient is on Xarelto for a CHADS2VASC score of 3. Patient continued to have episodes of symptomatic afib despite flecainide and underwent afib ablation with Dr Curt Bears on 09/21/19. Patient reports she has done well since then with no heart racing or palpitations. She denies CP, swallowing, or groin issues. She does admit that Xarelto is cost prohibitive for her and she is inquiring about warfarin.  Today, she denies symptoms of palpitations, chest pain, shortness of breath, orthopnea, PND, lower extremity edema, dizziness, presyncope, syncope, snoring, daytime somnolence, bleeding, or neurologic sequela. The patient is tolerating medications without difficulties and is otherwise without complaint today.    Atrial Fibrillation Risk Factors:  she does have symptoms or diagnosis of sleep apnea. she is compliant with CPAP therapy. she does not have a history of rheumatic fever.   she has a BMI of Body mass index is 41.38 kg/m.Marland Kitchen Filed Weights   10/19/19 1105  Weight: 116.3 kg    Family History  Problem Relation Age of Onset  . Heart disease Other   . Diabetes Maternal Grandfather   . Heart attack Paternal Grandfather      Atrial Fibrillation Management history:  Previous antiarrhythmic drugs: flecainide Previous cardioversions: none Previous ablations: 09/21/19 CHADS2VASC score: 3 Anticoagulation history: Xarelto   Past Medical History:  Diagnosis Date  . Anxiety   . Arthritis    "hands, neck, back" (07/22/2013)  . Atrial fibrillation ([place])     . CHEST DISCOMFORT   . Chronic back pain    "started in lower back; getting to be all over my back" (07/22/2013)  . Chronic bronchitis ([place])    "I've had it 2 years in a row; do have some trouble breathing at times; use an inhaler prn; not asthma" (07/22/2013)  . Chronic neck pain   . COPD (chronic obstructive pulmonary disease) ([place])   . DDD (degenerative disc disease), cervical   . DDD (degenerative disc disease), lumbar   . Depression   . Dysrhythmia   . GERD (gastroesophageal reflux disease)   . Hepatitis 1971   "don't know if it was A or B" (07/22/2013)  . HYPERLIPIDEMIA-MIXED 2005   "went away after I started taking fish oil" (07/22/2013)  . Hypothyroidism   . Palpitations   . Pneumonia 2013  . Type II diabetes mellitus ([place])    "diet controlled" (07/22/2013)   Past Surgical History:  Procedure Laterality Date  . ANTERIOR CERVICAL DECOMP/DISCECTOMY FUSION  ?2003   "w/plating" (07/22/2013)  . ATRIAL FIBRILLATION ABLATION N/A 09/21/2019   Procedure: ATRIAL FIBRILLATION ABLATION;  Surgeon: Constance Haw, MD;  Location: [facility];  Service: Cardiovascular;  Laterality: N/A;  . BUNIONECTOMY Left 2013  . DILATION AND CURETTAGE OF UTERUS  1976  . PLANTAR FASCIA SURGERY Right 2011    Current Outpatient Medications  Medication Sig Dispense Refill  . acetaminophen (TYLENOL) 500 MG tablet Take 1,000 mg by mouth every 6 (six) hours as needed for moderate pain or headache.     . albuterol (PROVENTIL HFA;VENTOLIN HFA) 108 (90 BASE) MCG/ACT inhaler Inhale 1 puff into the lungs  every 6 (six) hours as needed for wheezing or shortness of breath. (Patient taking differently: Inhale 2 puffs into the lungs every 6 (six) hours as needed for wheezing or shortness of breath. ) 1 Inhaler 1  . ALPRAZolam (XANAX) 0.25 MG tablet Take 0.25 mg by mouth 2 (two) times daily as needed for anxiety.     . Ascorbic Acid (VITAMIN C) 1000 MG tablet Take 1,000 mg by mouth once a week.     Marland Kitchen  buPROPion (WELLBUTRIN XL) 150 MG 24 hr tablet Take 300 mg by mouth daily.     . cetirizine (ZYRTEC) 10 MG tablet Take 10 mg by mouth daily.     . chlorhexidine (PERIDEX) 0.12 % solution Use as directed 15 mLs in the mouth or throat See admin instructions. Swish with 15 mls for 2 minutes then spit when needed for mouth sores    . Cholecalciferol (VITAMIN D3) 5000 UNITS CAPS Take 5,000 mg by mouth daily.    . Cinnamon 500 MG TABS Take 1,000 mg by mouth daily.    Marland Kitchen escitalopram (LEXAPRO) 10 MG tablet Take 10 mg by mouth daily.     Marland Kitchen ESTRADIOL TD Place 1 mL onto the skin daily. Estradiol 7.5mg /gm cream    . flecainide (TAMBOCOR) 100 MG tablet Take 1 tablet (100 mg total) by mouth 2 (two) times daily. 60 tablet 7  . Fluticasone-Salmeterol (ADVAIR) 500-50 MCG/DOSE AEPB Inhale 1 puff into the lungs in the morning and at bedtime.     . furosemide (LASIX) 80 MG tablet Take 1 tablet (80 mg total) by mouth daily. 90 tablet 3  . Guaifenesin 1200 MG TB12 Take 1,200 mg by mouth daily.    . hydrOXYzine (ATARAX/VISTARIL) 50 MG tablet Take 50 mg by mouth daily as needed for itching.     Marland Kitchen ketoconazole (NIZORAL) 2 % cream Apply 1 application topically daily.     . metoprolol tartrate (LOPRESSOR) 25 MG tablet Take 1 tablet (25 mg total) by mouth 2 (two) times daily. 180 tablet 3  . montelukast (SINGULAIR) 10 MG tablet Take 10 mg by mouth at bedtime.    . OIL OF OREGANO PO Take 1-2 capsules by mouth daily as needed (cold symptoms).     . Omega-3 Fatty Acids (FISH OIL) 1000 MG CAPS Take 2,000 mg by mouth daily.    Marland Kitchen omeprazole (PRILOSEC) 20 MG capsule Take 20 mg by mouth daily.     . ondansetron (ZOFRAN) 8 MG tablet Take 8 mg by mouth every 8 (eight) hours as needed for nausea or vomiting.    Marland Kitchen oxyCODONE (ROXICODONE) 15 MG immediate release tablet Take 15 mg by mouth 4 (four) times daily.     . potassium chloride (KLOR-CON M10) 10 MEQ tablet Take 1 tablet (10 mEq total) by mouth daily. 90 tablet 3  . pregabalin  (LYRICA) 75 MG capsule Take 75 mg by mouth 3 (three) times daily.     . progesterone (PROMETRIUM) 100 MG capsule Take 300 mg by mouth every evening.     . rivaroxaban (XARELTO) 20 MG TABS tablet Take 1 tablet (20 mg total) by mouth daily with supper. 90 tablet 3  . sulfamethoxazole-trimethoprim (BACTRIM DS) 800-160 MG tablet Take 1 tablet by mouth 2 (two) times daily.    . TESTOSTERONE TD Place 1-2 application onto the skin See admin instructions. Testosterone 2% cream. Apply 1 click's worth of testosterone cream on Sun, Tues, Thurs, and Sat. Apply 2 click's worth on Mon, Wed, and Fri    .  thyroid (ARMOUR) 60 MG tablet Take 30-120 mg by mouth See admin instructions. Take 120 mg in the morning and 30 mg in the afternoon    . tiZANidine (ZANAFLEX) 4 MG tablet Take 2 mg by mouth 4 (four) times daily as needed for muscle spasms.     . TURMERIC PO Take 1,440 mg by mouth every evening.    . Vitamin A 2400 MCG (8000 UT) CAPS Take 2,400 mcg by mouth daily.    . Vitamin D-Vitamin K (VITAMIN K2-VITAMIN D3 PO) Take 1 capsule by mouth every evening.     No current facility-administered medications for this encounter.    Allergies  Allergen Reactions  . Avelox [Moxifloxacin Hcl In Nacl] Hives  . Linezolid Rash  . Venlafaxine Nausea Only and Rash  . Rosuvastatin Other (See Comments)    myalgias  . Statins Other (See Comments)    Muscle pain  . Cephalexin Itching and Rash  . Nitrofurantoin Itching and Rash  . Other Rash    Brewer Advertising copywriter causes cracks in mouth stomach bloating   . Penicillins Itching and Rash    Has patient had a PCN reaction causing anaphylaxis, immediate rash, facial/tongue/throat swelling, SOB or lightheadedness with hypotension? no Has patient had a PCN reaction causing severe rash involving mucus membranes or skin necrosis? no Has patient had a PCN reaction that required hospitilization?no  Has patient had a PCN reaction occurring within the last 10 years?  no If all of the above answers are "no" then may proceed with cephalosporin use.    Social History   Socioeconomic History  . Marital status: Married    Spouse name: Not on file  . Number of children: Not on file  . Years of education: Not on file  . Highest education level: Not on file  Occupational History  . Not on file  Tobacco Use  . Smoking status: Former Smoker    Packs/day: 1.00    Years: 40.00    Pack years: 40.00    Types: Cigarettes    Quit date: 12/22/2007    Years since quitting: 11.8  . Smokeless tobacco: Never Used  Substance and Sexual Activity  . Alcohol use: Not Currently    Comment: 07/22/2013 "last drink was in ~ 2005"  . Drug use: No  . Sexual activity: Not Currently  Other Topics Concern  . Not on file  Social History Narrative  . Not on file   Social Determinants of Health   Financial Resource Strain:   . Difficulty of Paying Living Expenses:   Food Insecurity:   . Worried About Charity fundraiser in the Last Year:   . Arboriculturist in the Last Year:   Transportation Needs:   . Film/video editor (Medical):   Marland Kitchen Lack of Transportation (Non-Medical):   Physical Activity:   . Days of Exercise per Week:   . Minutes of Exercise per Session:   Stress:   . Feeling of Stress :   Social Connections:   . Frequency of Communication with Friends and Family:   . Frequency of Social Gatherings with Friends and Family:   . Attends Religious Services:   . Active Member of Clubs or Organizations:   . Attends Archivist Meetings:   Marland Kitchen Marital Status:   Intimate Partner Violence:   . Fear of Current or Ex-Partner:   . Emotionally Abused:   Marland Kitchen Physically Abused:   . Sexually Abused:  ROS- All systems are reviewed and negative except as per the HPI above.  Physical Exam: Vitals:   10/19/19 1105  BP: 132/68  Pulse: (!) 57  Weight: 116.3 kg  Height: 5\' 6"  (1.676 m)    GEN- The patient is well appearing obese female, alert and  oriented x 3 today.   Head- normocephalic, atraumatic Eyes-  Sclera clear, conjunctiva pink Ears- hearing intact Oropharynx- clear Neck- supple  Lungs- Clear to ausculation bilaterally, normal work of breathing Heart- Regular rate and rhythm, no murmurs, rubs or gallops  GI- soft, NT, ND, + BS Extremities- no clubbing, cyanosis, or edema MS- no significant deformity or atrophy Skin- no rash or lesion Psych- euthymic mood, full affect Neuro- strength and sensation are intact  Wt Readings from Last 3 Encounters:  10/19/19 116.3 kg  09/21/19 113.4 kg  09/06/19 115.2 kg    EKG today demonstrates SB HR 57, LAFB, slow R wave prog, PR 194, QRS 118, QTc 469  Echo 09/14/17 demonstrated  - Left ventricle: The cavity size was normal. Wall thickness was  increased in a pattern of mild LVH. Systolic function was normal.  The estimated ejection fraction was in the range of 55% to 60%.  Wall motion was normal; there were no regional wall motion  abnormalities. Features are consistent with a pseudonormal left  ventricular filling pattern, with concomitant abnormal relaxation  and increased filling pressure (grade 2 diastolic dysfunction).  Doppler parameters are consistent with high ventricular filling  pressure.  - Left atrium: The atrium was mildly dilated.  - Pulmonary arteries: Systolic pressure was mildly increased. PA  peak pressure: 37 mm Hg (S).   Impressions:   - Normal LV systolic function; moderate diastolic dysfunction; mild  LVH; mild LAE; mild TR with mild pulmonary hypertension.  Epic records are reviewed at length today  CHA2DS2-VASc Score = 3  The patient's score is based upon: CHF History: 0 HTN History: 0 Age : 1 Diabetes History: 1 Stroke History: 0 Vascular Disease History: 0 Gender: 1  { This patient does not have a recorded CHADS2-VASc score.   Click here to calculate score.  Then [facility] your note.       [id number]    ASSESSMENT AND  PLAN: 1. Paroxysmal Atrial Fibrillation (ICD10:  I48.0) The patient's CHA2DS2-VASc score is 3, indicating a 3.2% annual risk of stroke.   S/p afib ablation with Dr Curt Bears on 09/21/19. She appears to be maintaining SR. Continue Xarelto 20 mg daily. Samples given to get her through the post ablation period. Would be hesitant to transition to warfarin during the first 3 months post ablation given the elevated stroke risk. Would recommend establishing with CC 3 months post ablation. Guidelines do not recommend ASA for stroke prevention for afib. Continue Lopressor 25 mg BID Continue flecainide 100 mg BID  2. Secondary Hypercoagulable State ([id number]:  D68.69){Click to add to Prob List or Visit Dx  :[id number] The patient is at significant risk for stroke/thromboembolism based upon her CHA2DS2-VASc Score of 3.  Continue Rivaroxaban (Xarelto).   3. Obesity Body mass index is 41.38 kg/m. Lifestyle modification was discussed at length including regular exercise and weight reduction.  4. Obstructive sleep apnea The importance of adequate treatment of sleep apnea was discussed today in order to improve our ability to maintain sinus rhythm long term. Patient reports compliance with CPAP therapy.   Follow up with Dr Curt Bears per recall.    [facility] [address]  [place], [place] [address] [phone] 10/19/2019 12:07 PM

## 2019-10-19 ENCOUNTER — Encounter (HOSPITAL_COMMUNITY): Payer: Self-pay | Admitting: Physician Assistant

## 2019-10-19 ENCOUNTER — Ambulatory Visit (HOSPITAL_COMMUNITY)
Admission: RE | Admit: 2019-10-19 | Discharge: 2019-10-19 | Disposition: A | Discharge status: Home / Self Care | Payer: PPO | Source: Ambulatory Visit | Attending: Physician Assistant | Admitting: Physician Assistant

## 2019-10-19 ENCOUNTER — Other Ambulatory Visit: Payer: Self-pay

## 2019-10-19 VITALS — BP 132/68 | HR 57 | Ht 66.0 in | Wt 256.4 lb

## 2019-10-19 DIAGNOSIS — K219 Gastro-esophageal reflux disease without esophagitis: Secondary | ICD-10-CM | POA: Diagnosis not present

## 2019-10-19 DIAGNOSIS — E119 Type 2 diabetes mellitus without complications: Secondary | ICD-10-CM | POA: Insufficient documentation

## 2019-10-19 DIAGNOSIS — E669 Obesity, unspecified: Secondary | ICD-10-CM | POA: Insufficient documentation

## 2019-10-19 DIAGNOSIS — Z9989 Dependence on other enabling machines and devices: Secondary | ICD-10-CM | POA: Diagnosis not present

## 2019-10-19 DIAGNOSIS — Z79899 Other long term (current) drug therapy: Secondary | ICD-10-CM | POA: Diagnosis not present

## 2019-10-19 DIAGNOSIS — E039 Hypothyroidism, unspecified: Secondary | ICD-10-CM | POA: Insufficient documentation

## 2019-10-19 DIAGNOSIS — Z79891 Long term (current) use of opiate analgesic: Secondary | ICD-10-CM | POA: Insufficient documentation

## 2019-10-19 DIAGNOSIS — Z7901 Long term (current) use of anticoagulants: Secondary | ICD-10-CM | POA: Insufficient documentation

## 2019-10-19 DIAGNOSIS — J449 Chronic obstructive pulmonary disease, unspecified: Secondary | ICD-10-CM | POA: Diagnosis not present

## 2019-10-19 DIAGNOSIS — Z87891 Personal history of nicotine dependence: Secondary | ICD-10-CM | POA: Insufficient documentation

## 2019-10-19 DIAGNOSIS — D6869 Other thrombophilia: Secondary | ICD-10-CM | POA: Insufficient documentation

## 2019-10-19 DIAGNOSIS — Z8249 Family history of ischemic heart disease and other diseases of the circulatory system: Secondary | ICD-10-CM | POA: Insufficient documentation

## 2019-10-19 DIAGNOSIS — Z79890 Hormone replacement therapy (postmenopausal): Secondary | ICD-10-CM | POA: Diagnosis not present

## 2019-10-19 DIAGNOSIS — E782 Mixed hyperlipidemia: Secondary | ICD-10-CM | POA: Diagnosis not present

## 2019-10-19 DIAGNOSIS — Z888 Allergy status to other drugs, medicaments and biological substances status: Secondary | ICD-10-CM | POA: Diagnosis not present

## 2019-10-19 DIAGNOSIS — G4733 Obstructive sleep apnea (adult) (pediatric): Secondary | ICD-10-CM | POA: Insufficient documentation

## 2019-10-19 DIAGNOSIS — Z833 Family history of diabetes mellitus: Secondary | ICD-10-CM | POA: Insufficient documentation

## 2019-10-19 DIAGNOSIS — Z6841 Body Mass Index (BMI) 40.0 and over, adult: Secondary | ICD-10-CM | POA: Diagnosis not present

## 2019-10-19 DIAGNOSIS — Z881 Allergy status to other antibiotic agents status: Secondary | ICD-10-CM | POA: Diagnosis not present

## 2019-10-19 DIAGNOSIS — Z880 Allergy status to penicillin: Secondary | ICD-10-CM | POA: Diagnosis not present

## 2019-10-19 DIAGNOSIS — I480 Paroxysmal atrial fibrillation: Secondary | ICD-10-CM | POA: Diagnosis not present

## 2019-10-26 DIAGNOSIS — G4733 Obstructive sleep apnea (adult) (pediatric): Secondary | ICD-10-CM | POA: Diagnosis not present

## 2019-10-27 ENCOUNTER — Telehealth: Payer: Self-pay

## 2019-10-27 NOTE — Telephone Encounter (Signed)
**Note De-Identified Taylor [name] Obfuscation** The pt was denied pt asst through [place] because she has ins coverage for Xarelto but the cost with ins coverage is still expensive and she cannot afford. She wants to stop taking Xarelto and take Aspirin instead.  She was given 9 weeks worth of Xarelto samples at her afib OV on 5/12 and advised that Dr Curt Bears would advise at her next OV with him on 7/19 if she can stop taking Xarelto.  The pt states that she thinks she has enough Xarelto samples on hand to last until her f/u with Dr Curt Bears on 7/19 but will call office if she runs short.  She thanked me for calling and checking on her.

## 2019-10-31 DIAGNOSIS — M21621 Bunionette of right foot: Secondary | ICD-10-CM | POA: Diagnosis not present

## 2019-10-31 DIAGNOSIS — L97512 Non-pressure chronic ulcer of other part of right foot with fat layer exposed: Secondary | ICD-10-CM | POA: Diagnosis not present

## 2019-10-31 DIAGNOSIS — R7303 Prediabetes: Secondary | ICD-10-CM | POA: Diagnosis not present

## 2019-10-31 DIAGNOSIS — M2011 Hallux valgus (acquired), right foot: Secondary | ICD-10-CM | POA: Diagnosis not present

## 2019-11-08 ENCOUNTER — Other Ambulatory Visit: Payer: Self-pay

## 2019-11-08 MED ORDER — METOPROLOL TARTRATE 25 MG PO TABS: 25.0000 mg | ORAL_TABLET | Freq: Two times a day (BID) | ORAL | 1 refills | Status: DC

## 2019-11-11 DIAGNOSIS — M21621 Bunionette of right foot: Secondary | ICD-10-CM | POA: Diagnosis not present

## 2019-11-11 DIAGNOSIS — L97512 Non-pressure chronic ulcer of other part of right foot with fat layer exposed: Secondary | ICD-10-CM | POA: Diagnosis not present

## 2019-11-11 DIAGNOSIS — M2011 Hallux valgus (acquired), right foot: Secondary | ICD-10-CM | POA: Diagnosis not present

## 2019-11-11 DIAGNOSIS — R7303 Prediabetes: Secondary | ICD-10-CM | POA: Diagnosis not present

## 2019-11-18 DIAGNOSIS — M2011 Hallux valgus (acquired), right foot: Secondary | ICD-10-CM | POA: Diagnosis not present

## 2019-11-18 DIAGNOSIS — L97512 Non-pressure chronic ulcer of other part of right foot with fat layer exposed: Secondary | ICD-10-CM | POA: Diagnosis not present

## 2019-11-18 DIAGNOSIS — M21621 Bunionette of right foot: Secondary | ICD-10-CM | POA: Diagnosis not present

## 2019-11-18 DIAGNOSIS — R7303 Prediabetes: Secondary | ICD-10-CM | POA: Diagnosis not present

## 2019-11-21 DIAGNOSIS — G4733 Obstructive sleep apnea (adult) (pediatric): Secondary | ICD-10-CM | POA: Diagnosis not present

## 2019-11-24 ENCOUNTER — Telehealth: Payer: Self-pay | Admitting: *Deleted

## 2019-11-24 NOTE — Telephone Encounter (Signed)
° °  [facility] Pre-operative Risk Assessment    HEARTCARE STAFF: - Please ensure there is not already an duplicate clearance open for this procedure. - Under Visit Info/Reason for Call, type in Other and utilize the format Clearance MM/DD/YY or Clearance TBD. Do not use dashes or single digits. - If request is for dental extraction, please clarify the # of teeth to be extracted.  Request for surgical clearance:  1. What type of surgery is being performed? 1 TOOTH TO BE EXTRACTED   2. When is this surgery scheduled? TBD   3. What type of clearance is required (medical clearance vs. Pharmacy clearance to hold med vs. Both)? BOTH  4. Are there any medications that need to be held prior to surgery and how long? [place]   5. Practice name and name of physician performing surgery? [facility]; DR. Romie Minus   6. What is the office phone number? [phone]   7.   What is the office fax number? [phone]  8.   Anesthesia type (None, local, MAC, general) ? IV SEDATION (INCLUDING: VERSED, ATROPINE, FENTANYL, DECADRON, SODIUM BREVITAL)   Julaine Hua 11/24/2019, 1:32 PM  _________________________________________________________________   (provider comments below)

## 2019-11-24 NOTE — Telephone Encounter (Signed)
   Primary Cardiologist: Dorris Carnes, MD  Chart reviewed as part of pre-operative protocol coverage. Simple dental extractions are considered low risk procedures per guidelines and generally do not require any specific cardiac clearance. It is also generally accepted that for simple extractions and dental cleanings, there is no need to interrupt blood thinner therapy.   SBE prophylaxis is not required for the patient.  I will route this recommendation to the requesting party via Epic fax function and remove from pre-op pool.  Please call with questions.  Deberah Pelton, NP 11/24/2019, 1:45 PM

## 2019-12-07 DIAGNOSIS — G894 Chronic pain syndrome: Secondary | ICD-10-CM | POA: Diagnosis not present

## 2019-12-07 DIAGNOSIS — M47816 Spondylosis without myelopathy or radiculopathy, lumbar region: Secondary | ICD-10-CM | POA: Diagnosis not present

## 2019-12-07 DIAGNOSIS — M150 Primary generalized (osteo)arthritis: Secondary | ICD-10-CM | POA: Diagnosis not present

## 2019-12-07 DIAGNOSIS — M75121 Complete rotator cuff tear or rupture of right shoulder, not specified as traumatic: Secondary | ICD-10-CM | POA: Diagnosis not present

## 2019-12-13 DIAGNOSIS — L97512 Non-pressure chronic ulcer of other part of right foot with fat layer exposed: Secondary | ICD-10-CM | POA: Diagnosis not present

## 2019-12-23 NOTE — Telephone Encounter (Signed)
**Note De-Identified Taylor [name] Obfuscation** [employer] Pt Asst denied per phone note from 5/20/21as follows:  The pt was denied pt asst through [place] because she has ins coverage for Xarelto but the cost with ins coverage is still expensive and she cannot afford. She wants to stop taking Xarelto and take Aspirin instead.  She was given 9 weeks worth of Xarelto samples at her afib OV on 5/12 and advised that Dr Curt Bears would advise at her next OV with him on 7/19 if she can stop taking Xarelto.  The pt states that she thinks she has enough Xarelto samples on hand to last until her f/u with Dr Curt Bears on 7/19 but will call office if she runs short.  She thanked me for calling and checking on her.

## 2019-12-26 ENCOUNTER — Encounter: Payer: Self-pay | Admitting: Cardiology

## 2019-12-26 ENCOUNTER — Other Ambulatory Visit: Payer: Self-pay

## 2019-12-26 ENCOUNTER — Ambulatory Visit: Payer: PPO | Admitting: Cardiology

## 2019-12-26 VITALS — BP 124/68 | HR 64 | Ht 66.0 in | Wt 260.0 lb

## 2019-12-26 DIAGNOSIS — I480 Paroxysmal atrial fibrillation: Secondary | ICD-10-CM

## 2019-12-26 NOTE — Patient Instructions (Addendum)
Medication Instructions:  Your physician has recommended you make the following change in your medication:  1. STOP Flecainide  *If you need a refill on your cardiac medications before your next appointment, please call your pharmacy*   Lab Work: None ordered   Testing/Procedures: None ordered   Follow-Up: At [facility], you and your health needs are our priority.  As part of our continuing mission to provide you with exceptional heart care, we have created designated Provider Care Teams.  These Care Teams include your primary Cardiologist (physician) and Advanced Practice Providers (APPs -  Physician Assistants and Nurse Practitioners) who all work together to provide you with the care you need, when you need it.  Your next appointment:   3 month(s)  The format for your next appointment:   In Person  Provider:   Will Camnitz, MD    Thank you for choosing [facility]!!   Eliab Closson, RN [phone]   Other Instructions     

## 2019-12-26 NOTE — Progress Notes (Signed)
Electrophysiology Office Note   Date:  12/26/2019   ID:  Taylor [name], DOB 08-01-1950, MRN [id number]  PCP:  Larene Beach, MD  Cardiologist:  Harrington Challenger Primary Electrophysiologist:  Lashonta Pilling Meredith Leeds, MD    Chief Complaint: AF   History of Present Illness: Taylor [name] is a 69 y.o. female who is being seen today for the evaluation of AF at the request of Larene Beach, MD. Presenting today for electrophysiology evaluation.  She has a history significant for atrial fibrillation, COPD.  Atrial fibrillation initially presented in 2009, possibly due to a URI.  She started drinking coffee around the holidays and started having more episodes of atrial fibrillation.  Her symptoms are palpitations and some mild shortness of breath.  She is sent in multiple recordings from her watch showing atrial fibrillation.  She is held off on caffeine use over the last few days and has had no further episodes.  She is now status post AF ablation 09/21/2019.  Today, denies symptoms of palpitations, chest pain, shortness of breath, orthopnea, PND, lower extremity edema, claudication, dizziness, presyncope, syncope, bleeding, or neurologic sequela. The patient is tolerating medications without difficulties.  Overall she is doing well.  She has had no further episodes of atrial fibrillation since her ablation.  She is able to do all of her daily activities without restriction.  Her only issue is that she is not losing weight.   Past Medical History:  Diagnosis Date  . Anxiety   . Arthritis    "hands, neck, back" (07/22/2013)  . Atrial fibrillation ([place])   . CHEST DISCOMFORT   . Chronic back pain    "started in lower back; getting to be all over my back" (07/22/2013)  . Chronic bronchitis ([place])    "I've had it 2 years in a row; do have some trouble breathing at times; use an inhaler prn; not asthma" (07/22/2013)  . Chronic neck pain   . COPD (chronic obstructive pulmonary disease) ([place])   . DDD  (degenerative disc disease), cervical   . DDD (degenerative disc disease), lumbar   . Depression   . Dysrhythmia   . GERD (gastroesophageal reflux disease)   . Hepatitis 1971   "don't know if it was A or B" (07/22/2013)  . HYPERLIPIDEMIA-MIXED 2005   "went away after I started taking fish oil" (07/22/2013)  . Hypothyroidism   . Palpitations   . Pneumonia 2013  . Type II diabetes mellitus ([place])    "diet controlled" (07/22/2013)   Past Surgical History:  Procedure Laterality Date  . ANTERIOR CERVICAL DECOMP/DISCECTOMY FUSION  ?2003   "w/plating" (07/22/2013)  . ATRIAL FIBRILLATION ABLATION N/A 09/21/2019   Procedure: ATRIAL FIBRILLATION ABLATION;  Surgeon: Constance Haw, MD;  Location: [facility];  Service: Cardiovascular;  Laterality: N/A;  . BUNIONECTOMY Left 2013  . DILATION AND CURETTAGE OF UTERUS  1976  . PLANTAR FASCIA SURGERY Right 2011     Current Outpatient Medications  Medication Sig Dispense Refill  . acetaminophen (TYLENOL) 500 MG tablet Take 1,000 mg by mouth every 6 (six) hours as needed for moderate pain or headache.     . albuterol (PROVENTIL HFA;VENTOLIN HFA) 108 (90 BASE) MCG/ACT inhaler Inhale 1 puff into the lungs every 6 (six) hours as needed for wheezing or shortness of breath. 1 Inhaler 1  . ALPRAZolam (XANAX) 0.25 MG tablet Take 0.25 mg by mouth 2 (two) times daily as needed for anxiety.     . Ascorbic Acid (VITAMIN C)  1000 MG tablet Take 1,000 mg by mouth once a week.     Marland Kitchen buPROPion (WELLBUTRIN XL) 150 MG 24 hr tablet Take 300 mg by mouth daily.     . cetirizine (ZYRTEC) 10 MG tablet Take 10 mg by mouth daily.     . chlorhexidine (PERIDEX) 0.12 % solution Use as directed 15 mLs in the mouth or throat See admin instructions. Swish with 15 mls for 2 minutes then spit when needed for mouth sores    . Cholecalciferol (VITAMIN D3) 5000 UNITS CAPS Take 5,000 mg by mouth daily.    . Cinnamon 500 MG TABS Take 1,000 mg by mouth daily.    Marland Kitchen escitalopram  (LEXAPRO) 10 MG tablet Take 10 mg by mouth daily.     Marland Kitchen ESTRADIOL TD Place 1 mL onto the skin daily. Estradiol 7.5mg /gm cream    . Fluticasone-Salmeterol (ADVAIR) 500-50 MCG/DOSE AEPB Inhale 1 puff into the lungs in the morning and at bedtime.     . furosemide (LASIX) 80 MG tablet Take 1 tablet (80 mg total) by mouth daily. 90 tablet 3  . Guaifenesin 1200 MG TB12 Take 1,200 mg by mouth daily.    . hydrOXYzine (ATARAX/VISTARIL) 50 MG tablet Take 50 mg by mouth daily as needed for itching.     Marland Kitchen ketoconazole (NIZORAL) 2 % cream Apply 1 application topically daily.     . metoprolol tartrate (LOPRESSOR) 25 MG tablet Take 1 tablet (25 mg total) by mouth 2 (two) times daily. 180 tablet 1  . montelukast (SINGULAIR) 10 MG tablet Take 10 mg by mouth at bedtime.    . OIL OF OREGANO PO Take 1-2 capsules by mouth daily as needed (cold symptoms).     . Omega-3 Fatty Acids (FISH OIL) 1000 MG CAPS Take 2,000 mg by mouth daily.    Marland Kitchen omeprazole (PRILOSEC) 20 MG capsule Take 20 mg by mouth daily.     . ondansetron (ZOFRAN) 8 MG tablet Take 8 mg by mouth every 8 (eight) hours as needed for nausea or vomiting.    Marland Kitchen oxyCODONE (ROXICODONE) 15 MG immediate release tablet Take 15 mg by mouth 4 (four) times daily.     . potassium chloride (KLOR-CON M10) 10 MEQ tablet Take 1 tablet (10 mEq total) by mouth daily. 90 tablet 3  . pregabalin (LYRICA) 75 MG capsule Take 75 mg by mouth 3 (three) times daily.     . progesterone (PROMETRIUM) 100 MG capsule Take 300 mg by mouth every evening.     . rivaroxaban (XARELTO) 20 MG TABS tablet Take 1 tablet (20 mg total) by mouth daily with supper. 90 tablet 3  . TESTOSTERONE TD Place 1-2 application onto the skin See admin instructions. Testosterone 2% cream. Apply 1 click's worth of testosterone cream on Sun, Tues, Thurs, and Sat. Apply 2 click's worth on Mon, Wed, and Fri    . thyroid (ARMOUR) 60 MG tablet Take 30-120 mg by mouth See admin instructions. Take 120 mg in the morning and  30 mg in the afternoon    . tiZANidine (ZANAFLEX) 4 MG tablet Take 2 mg by mouth 4 (four) times daily as needed for muscle spasms.     . TURMERIC PO Take 1,440 mg by mouth every evening.    . Vitamin A 2400 MCG (8000 UT) CAPS Take 2,400 mcg by mouth daily.    . Vitamin D-Vitamin K (VITAMIN K2-VITAMIN D3 PO) Take 1 capsule by mouth every evening.     No current  facility-administered medications for this visit.    Allergies:   Avelox [moxifloxacin hcl in nacl], Linezolid, Venlafaxine, Rosuvastatin, Clindamycin, Statins, Cephalexin, Nitrofurantoin, Other, and Penicillins   Social History:  The patient  reports that she quit smoking about 12 years ago. Her smoking use included cigarettes. She has a 40.00 pack-year smoking history. She has never used smokeless tobacco. She reports previous alcohol use. She reports that she does not use drugs.   Family History:  The patient's family history includes Diabetes in her maternal grandfather; Heart attack in her paternal grandfather; Heart disease in an other family member.   ROS:  Please see the history of present illness.   Otherwise, review of systems is positive for none.   All other systems are reviewed and negative.   PHYSICAL EXAM: VS:  BP 124/68   Pulse 64   Ht 5\' 6"  (1.676 m)   Wt 260 lb (117.9 kg)   SpO2 95%   BMI 41.97 kg/m  , BMI Body mass index is 41.97 kg/m. GEN: Well nourished, well developed, in no acute distress  HEENT: normal  Neck: no JVD, carotid bruits, or masses Cardiac: RRR; no murmurs, rubs, or gallops,no edema  Respiratory:  clear to auscultation bilaterally, normal work of breathing GI: soft, nontender, nondistended, + BS MS: no deformity or atrophy  Skin: warm and dry Neuro:  Strength and sensation are intact Psych: euthymic mood, full affect  EKG:  EKG is ordered today. Personal review of the ekg ordered shows sinus rhythm, rate 65 and I Alaina Donati see her back in 6 months  Recent Labs: 04/22/2019: NT-Pro BNP  397 09/21/2019: BUN 19; Creatinine, Ser 1.11; Hemoglobin 14.1; Platelets 250; Potassium 3.5; Sodium 138    Lipid Panel     Component Value Date/Time   CHOL 210 (H) 04/25/2019 1543   TRIG 159 (H) 04/25/2019 1543   HDL 54 04/25/2019 1543   CHOLHDL 3.9 04/25/2019 1543   CHOLHDL 4.2 CALC 11/17/2007 0844   VLDL 18 11/17/2007 0844   LDLCALC 128 (H) 04/25/2019 1543     Wt Readings from Last 3 Encounters:  12/26/19 260 lb (117.9 kg)  10/19/19 256 lb 6.4 oz (116.3 kg)  09/21/19 250 lb (113.4 kg)      Other studies Reviewed: Additional studies/ records that were reviewed today include: TTE 09/14/17  Review of the above records today demonstrates:  - Left ventricle: The cavity size was normal. Wall thickness was   increased in a pattern of mild LVH. Systolic function was normal.   The estimated ejection fraction was in the range of 55% to 60%.   Wall motion was normal; there were no regional wall motion   abnormalities. Features are consistent with a pseudonormal left   ventricular filling pattern, with concomitant abnormal relaxation   and increased filling pressure (grade 2 diastolic dysfunction).   Doppler parameters are consistent with high ventricular filling   pressure. - Left atrium: The atrium was mildly dilated. - Pulmonary arteries: Systolic pressure was mildly increased. PA   peak pressure: 37 mm Hg (S).  Monitor 11/22/18 personally reviewed SInus rhythm   NO signifcant arrhythmias detected Patient did not appear to have any symptoms while wearing  ASSESSMENT AND PLAN:  1.  Paroxysmal atrial fibrillation: Currently on flecainide and Xarelto.  CHA2DS2-VASc of 3.  Status post ablation 09/21/2019.  She is feeling well without further episodes of atrial fibrillation.  Due to that we Javen Hinderliter stop her flecainide.  ECG performed for monitoring of flecainide, a high  risk medication.  2.  OSA: CPAP compliance encouraged  3.  Obesity: Diet and exercise encouraged  Current medicines  are reviewed at length with the patient today.   The patient does not have concerns regarding her medicines.  The following changes were made today: Stop flecainide  Labs/ tests ordered today include:  Orders Placed This Encounter  Procedures  . EKG 12-Lead     Disposition:   FU with Elzie Sheets 3 months  Signed, Gera Inboden Meredith Leeds, MD  12/26/2019 11:37 AM     [facility] [address] [place] [address] [phone] (office) [phone] (fax)

## 2019-12-30 DIAGNOSIS — L97512 Non-pressure chronic ulcer of other part of right foot with fat layer exposed: Secondary | ICD-10-CM | POA: Diagnosis not present

## 2020-01-02 DIAGNOSIS — L89892 Pressure ulcer of other site, stage 2: Secondary | ICD-10-CM | POA: Diagnosis not present

## 2020-01-23 DIAGNOSIS — L97512 Non-pressure chronic ulcer of other part of right foot with fat layer exposed: Secondary | ICD-10-CM | POA: Diagnosis not present

## 2020-02-02 DIAGNOSIS — G894 Chronic pain syndrome: Secondary | ICD-10-CM | POA: Diagnosis not present

## 2020-02-02 DIAGNOSIS — R7301 Impaired fasting glucose: Secondary | ICD-10-CM | POA: Diagnosis not present

## 2020-02-02 DIAGNOSIS — M75121 Complete rotator cuff tear or rupture of right shoulder, not specified as traumatic: Secondary | ICD-10-CM | POA: Diagnosis not present

## 2020-02-02 DIAGNOSIS — E782 Mixed hyperlipidemia: Secondary | ICD-10-CM | POA: Diagnosis not present

## 2020-02-02 DIAGNOSIS — M150 Primary generalized (osteo)arthritis: Secondary | ICD-10-CM | POA: Diagnosis not present

## 2020-02-02 DIAGNOSIS — M47816 Spondylosis without myelopathy or radiculopathy, lumbar region: Secondary | ICD-10-CM | POA: Diagnosis not present

## 2020-02-02 DIAGNOSIS — E039 Hypothyroidism, unspecified: Secondary | ICD-10-CM | POA: Diagnosis not present

## 2020-02-06 DIAGNOSIS — J449 Chronic obstructive pulmonary disease, unspecified: Secondary | ICD-10-CM | POA: Diagnosis not present

## 2020-02-06 DIAGNOSIS — E039 Hypothyroidism, unspecified: Secondary | ICD-10-CM | POA: Diagnosis not present

## 2020-02-06 DIAGNOSIS — T63441A Toxic effect of venom of bees, accidental (unintentional), initial encounter: Secondary | ICD-10-CM | POA: Diagnosis not present

## 2020-02-06 DIAGNOSIS — E782 Mixed hyperlipidemia: Secondary | ICD-10-CM | POA: Diagnosis not present

## 2020-02-06 DIAGNOSIS — F329 Major depressive disorder, single episode, unspecified: Secondary | ICD-10-CM | POA: Diagnosis not present

## 2020-02-06 DIAGNOSIS — I4891 Unspecified atrial fibrillation: Secondary | ICD-10-CM | POA: Diagnosis not present

## 2020-02-06 DIAGNOSIS — F39 Unspecified mood [affective] disorder: Secondary | ICD-10-CM | POA: Diagnosis not present

## 2020-02-06 DIAGNOSIS — Z6841 Body Mass Index (BMI) 40.0 and over, adult: Secondary | ICD-10-CM | POA: Diagnosis not present

## 2020-02-06 DIAGNOSIS — M545 Low back pain: Secondary | ICD-10-CM | POA: Diagnosis not present

## 2020-02-06 DIAGNOSIS — R7301 Impaired fasting glucose: Secondary | ICD-10-CM | POA: Diagnosis not present

## 2020-02-16 DIAGNOSIS — G4733 Obstructive sleep apnea (adult) (pediatric): Secondary | ICD-10-CM | POA: Diagnosis not present

## 2020-02-29 DIAGNOSIS — L89892 Pressure ulcer of other site, stage 2: Secondary | ICD-10-CM | POA: Diagnosis not present

## 2020-02-29 DIAGNOSIS — L97512 Non-pressure chronic ulcer of other part of right foot with fat layer exposed: Secondary | ICD-10-CM | POA: Diagnosis not present

## 2020-03-13 DIAGNOSIS — G4733 Obstructive sleep apnea (adult) (pediatric): Secondary | ICD-10-CM | POA: Diagnosis not present

## 2020-03-20 DIAGNOSIS — M1612 Unilateral primary osteoarthritis, left hip: Secondary | ICD-10-CM | POA: Diagnosis not present

## 2020-03-21 DIAGNOSIS — L97512 Non-pressure chronic ulcer of other part of right foot with fat layer exposed: Secondary | ICD-10-CM | POA: Diagnosis not present

## 2020-03-29 DIAGNOSIS — Z79891 Long term (current) use of opiate analgesic: Secondary | ICD-10-CM | POA: Diagnosis not present

## 2020-03-29 DIAGNOSIS — M75121 Complete rotator cuff tear or rupture of right shoulder, not specified as traumatic: Secondary | ICD-10-CM | POA: Diagnosis not present

## 2020-03-29 DIAGNOSIS — M150 Primary generalized (osteo)arthritis: Secondary | ICD-10-CM | POA: Diagnosis not present

## 2020-03-29 DIAGNOSIS — G894 Chronic pain syndrome: Secondary | ICD-10-CM | POA: Diagnosis not present

## 2020-03-29 DIAGNOSIS — M47816 Spondylosis without myelopathy or radiculopathy, lumbar region: Secondary | ICD-10-CM | POA: Diagnosis not present

## 2020-04-02 ENCOUNTER — Other Ambulatory Visit: Payer: Self-pay | Admitting: Internal Medicine

## 2020-04-02 DIAGNOSIS — R609 Edema, unspecified: Secondary | ICD-10-CM

## 2020-04-03 ENCOUNTER — Ambulatory Visit: Payer: PPO | Admitting: Cardiology

## 2020-04-03 ENCOUNTER — Encounter: Payer: Self-pay | Admitting: Cardiology

## 2020-04-03 ENCOUNTER — Other Ambulatory Visit: Payer: Self-pay

## 2020-04-03 VITALS — BP 142/64 | HR 69 | Ht 66.0 in | Wt 260.0 lb

## 2020-04-03 DIAGNOSIS — I480 Paroxysmal atrial fibrillation: Secondary | ICD-10-CM | POA: Diagnosis not present

## 2020-04-03 NOTE — Patient Instructions (Signed)
Medication Instructions:  Your physician recommends that you continue on your current medications as directed. Please refer to the Current Medication list given to you today.  *If you need a refill on your cardiac medications before your next appointment, please call your pharmacy*   Lab Work: None ordered If you have labs (blood work) drawn today and your tests are completely normal, you will receive your results only by: . MyChart Message (if you have MyChart) OR . A paper copy in the mail If you have any lab test that is abnormal or we need to change your treatment, we will call you to review the results.   Testing/Procedures: None ordered   Follow-Up: At [facility], you and your health needs are our priority.  As part of our continuing mission to provide you with exceptional heart care, we have created designated Provider Care Teams.  These Care Teams include your primary Cardiologist (physician) and Advanced Practice Providers (APPs -  Physician Assistants and Nurse Practitioners) who all work together to provide you with the care you need, when you need it.  We recommend signing up for the patient portal called "MyChart".  Sign up information is provided on this After Visit Summary.  MyChart is used to connect with patients for Virtual Visits (Telemedicine).  Patients are able to view lab/test results, encounter notes, upcoming appointments, etc.  Non-urgent messages can be sent to your provider as well.   To learn more about what you can do with MyChart, go to [email or web address].    Your next appointment:   6 month(s)  The format for your next appointment:   In Person  Provider:   Will Camnitz, MD   Thank you for choosing [facility]!!   Saralynn Langhorst, RN [phone]    Other Instructions    

## 2020-04-03 NOTE — Progress Notes (Signed)
Electrophysiology Office Note   Date:  04/03/2020   ID:  [name], [name] 06/15/1950, MRN [id number]  PCP:  Larene Beach, MD  Cardiologist:  Harrington Challenger Primary Electrophysiologist:  Loyed Wilmes Meredith Leeds, MD    Chief Complaint: AF   History of Present Illness: Taylor [name] is a 69 y.o. female who is being seen today for the evaluation of AF at the request of Larene Beach, MD. Presenting today for electrophysiology evaluation.  She has a history significant for atrial fibrillation and COPD.  Her atrial fibrillation was diagnosed in 2009, possibly due to a URI.  She started drinking coffee around holidays and started having more episodes of atrial fibrillation.  She was having symptoms of palpitations and shortness of breath.  Status post AF ablation 09/21/2019.  Today, denies symptoms of palpitations, chest pain, shortness of breath, orthopnea, PND, lower extremity edema, claudication, dizziness, presyncope, syncope, bleeding, or neurologic sequela. The patient is tolerating medications without difficulties.  Since last being seen she has done well.  She has had no further episodes of atrial fibrillation.  Unfortunately, she stepped on a yellowjacket nest and got stung multiple times.  In her panic to try and get back in the house, she strained her back and she has been dealing with that for the last few months.  Fortunately she has not had any more atrial fibrillation despite her issues.  Past Medical History:  Diagnosis Date  . Anxiety   . Arthritis    "hands, neck, back" (07/22/2013)  . Atrial fibrillation ([place])   . CHEST DISCOMFORT   . Chronic back pain    "started in lower back; getting to be all over my back" (07/22/2013)  . Chronic bronchitis ([place])    "I've had it 2 years in a row; do have some trouble breathing at times; use an inhaler prn; not asthma" (07/22/2013)  . Chronic neck pain   . COPD (chronic obstructive pulmonary disease) ([place])   . DDD (degenerative disc  disease), cervical   . DDD (degenerative disc disease), lumbar   . Depression   . Dysrhythmia   . GERD (gastroesophageal reflux disease)   . Hepatitis 1971   "don't know if it was A or B" (07/22/2013)  . HYPERLIPIDEMIA-MIXED 2005   "went away after I started taking fish oil" (07/22/2013)  . Hypothyroidism   . Palpitations   . Pneumonia 2013  . Type II diabetes mellitus ([place])    "diet controlled" (07/22/2013)   Past Surgical History:  Procedure Laterality Date  . ANTERIOR CERVICAL DECOMP/DISCECTOMY FUSION  ?2003   "w/plating" (07/22/2013)  . ATRIAL FIBRILLATION ABLATION N/A 09/21/2019   Procedure: ATRIAL FIBRILLATION ABLATION;  Surgeon: Constance Haw, MD;  Location: [facility];  Service: Cardiovascular;  Laterality: N/A;  . BUNIONECTOMY Left 2013  . DILATION AND CURETTAGE OF UTERUS  1976  . PLANTAR FASCIA SURGERY Right 2011     Current Outpatient Medications  Medication Sig Dispense Refill  . acetaminophen (TYLENOL) 500 MG tablet Take 1,000 mg by mouth every 6 (six) hours as needed for moderate pain or headache.     . albuterol (PROVENTIL HFA;VENTOLIN HFA) 108 (90 BASE) MCG/ACT inhaler Inhale 1 puff into the lungs every 6 (six) hours as needed for wheezing or shortness of breath. 1 Inhaler 1  . ALPRAZolam (XANAX) 0.25 MG tablet Take 0.25 mg by mouth 2 (two) times daily as needed for anxiety.     . Ascorbic Acid (VITAMIN C) 1000 MG tablet Take 1,000  mg by mouth once a week.     Marland Kitchen buPROPion (WELLBUTRIN XL) 150 MG 24 hr tablet Take 300 mg by mouth daily.     . cetirizine (ZYRTEC) 10 MG tablet Take 10 mg by mouth daily.     . chlorhexidine (PERIDEX) 0.12 % solution Use as directed 15 mLs in the mouth or throat See admin instructions. Swish with 15 mls for 2 minutes then spit when needed for mouth sores    . Cholecalciferol (VITAMIN D3) 5000 UNITS CAPS Take 5,000 mg by mouth daily.    . Cinnamon 500 MG TABS Take 1,000 mg by mouth daily.    Marland Kitchen ESTRADIOL TD Place 1 mL onto the  skin daily. Estradiol 7.5mg /gm cream    . Fluticasone-Salmeterol (ADVAIR) 500-50 MCG/DOSE AEPB Inhale 1 puff into the lungs in the morning and at bedtime.     . furosemide (LASIX) 80 MG tablet Take 1 tablet (80 mg total) by mouth daily. 90 tablet 3  . Guaifenesin 1200 MG TB12 Take 1,200 mg by mouth daily.    . hydrOXYzine (ATARAX/VISTARIL) 50 MG tablet Take 50 mg by mouth daily as needed for itching.     Marland Kitchen ketoconazole (NIZORAL) 2 % cream Apply 1 application topically daily.     . metoprolol tartrate (LOPRESSOR) 25 MG tablet Take 1 tablet (25 mg total) by mouth 2 (two) times daily. 180 tablet 1  . montelukast (SINGULAIR) 10 MG tablet Take 10 mg by mouth at bedtime.    . OIL OF OREGANO PO Take 1-2 capsules by mouth daily as needed (cold symptoms).     . Omega-3 Fatty Acids (FISH OIL) 1000 MG CAPS Take 2,000 mg by mouth daily.    Marland Kitchen omeprazole (PRILOSEC) 20 MG capsule Take 20 mg by mouth daily.     . ondansetron (ZOFRAN) 8 MG tablet Take 8 mg by mouth every 8 (eight) hours as needed for nausea or vomiting.    Marland Kitchen oxyCODONE (ROXICODONE) 15 MG immediate release tablet Take 15 mg by mouth 4 (four) times daily.     . potassium chloride (KLOR-CON M10) 10 MEQ tablet Take 1 tablet (10 mEq total) by mouth daily. 90 tablet 3  . pregabalin (LYRICA) 75 MG capsule Take 75 mg by mouth 3 (three) times daily.     . progesterone (PROMETRIUM) 100 MG capsule Take 300 mg by mouth every evening.     . rivaroxaban (XARELTO) 20 MG TABS tablet Take 1 tablet (20 mg total) by mouth daily with supper. 90 tablet 3  . TESTOSTERONE TD Place 1-2 application onto the skin See admin instructions. Testosterone 2% cream. Apply 1 click's worth of testosterone cream on Sun, Tues, Thurs, and Sat. Apply 2 click's worth on Mon, Wed, and Fri    . thyroid (ARMOUR) 60 MG tablet Take 30-120 mg by mouth See admin instructions. Take 120 mg in the morning and 30 mg in the afternoon    . tiZANidine (ZANAFLEX) 4 MG tablet Take 2 mg by mouth 4 (four)  times daily as needed for muscle spasms.     . TURMERIC PO Take 1,440 mg by mouth every evening.    . Vitamin A 2400 MCG (8000 UT) CAPS Take 2,400 mcg by mouth daily.    . Vitamin D-Vitamin K (VITAMIN K2-VITAMIN D3 PO) Take 1 capsule by mouth every evening.    . escitalopram (LEXAPRO) 10 MG tablet Take 10 mg by mouth daily.      No current facility-administered medications for this visit.  Allergies:   Avelox [moxifloxacin hcl in nacl], Linezolid, Venlafaxine, Rosuvastatin, Clindamycin, Statins, Cephalexin, Nitrofurantoin, Other, and Penicillins   Social History:  The patient  reports that she quit smoking about 12 years ago. Her smoking use included cigarettes. She has a 40.00 pack-year smoking history. She has never used smokeless tobacco. She reports previous alcohol use. She reports that she does not use drugs.   Family History:  The patient's family history includes Diabetes in her maternal grandfather; Heart attack in her paternal grandfather; Heart disease in an other family member.   ROS:  Please see the history of present illness.   Otherwise, review of systems is positive for none.   All other systems are reviewed and negative.   PHYSICAL EXAM: VS:  BP (!) 142/64   Pulse 69   Ht 5\' 6"  (1.676 m)   Wt 260 lb (117.9 kg)   SpO2 97%   BMI 41.97 kg/m  , BMI Body mass index is 41.97 kg/m. GEN: Well nourished, well developed, in no acute distress  HEENT: normal  Neck: no JVD, carotid bruits, or masses Cardiac: RRR; no murmurs, rubs, or gallops,no edema  Respiratory:  clear to auscultation bilaterally, normal work of breathing GI: soft, nontender, nondistended, + BS MS: no deformity or atrophy  Skin: warm and dry Neuro:  Strength and sensation are intact Psych: euthymic mood, full affect  EKG:  EKG is ordered today. Personal review of the ekg ordered shows sinus rhythm, rate 60  Recent Labs: 04/22/2019: NT-Pro BNP 397 09/21/2019: BUN 19; Creatinine, Ser 1.11; Hemoglobin  14.1; Platelets 250; Potassium 3.5; Sodium 138    Lipid Panel     Component Value Date/Time   CHOL 210 (H) 04/25/2019 1543   TRIG 159 (H) 04/25/2019 1543   HDL 54 04/25/2019 1543   CHOLHDL 3.9 04/25/2019 1543   CHOLHDL 4.2 CALC 11/17/2007 0844   VLDL 18 11/17/2007 0844   LDLCALC 128 (H) 04/25/2019 1543     Wt Readings from Last 3 Encounters:  04/03/20 260 lb (117.9 kg)  12/26/19 260 lb (117.9 kg)  10/19/19 256 lb 6.4 oz (116.3 kg)      Other studies Reviewed: Additional studies/ records that were reviewed today include: TTE 09/14/17  Review of the above records today demonstrates:  - Left ventricle: The cavity size was normal. Wall thickness was   increased in a pattern of mild LVH. Systolic function was normal.   The estimated ejection fraction was in the range of 55% to 60%.   Wall motion was normal; there were no regional wall motion   abnormalities. Features are consistent with a pseudonormal left   ventricular filling pattern, with concomitant abnormal relaxation   and increased filling pressure (grade 2 diastolic dysfunction).   Doppler parameters are consistent with high ventricular filling   pressure. - Left atrium: The atrium was mildly dilated. - Pulmonary arteries: Systolic pressure was mildly increased. PA   peak pressure: 37 mm Hg (S).  Monitor 11/22/18 personally reviewed SInus rhythm   NO signifcant arrhythmias detected Patient did not appear to have any symptoms while wearing  ASSESSMENT AND PLAN:  1.  Paroxysmal atrial fibrillation: Currently on Xarelto with a CHA2DS2-VASc 3.  Status post ablation 09/21/2019.  Flecainide was stopped at her last visit.  She is remained in sinus rhythm.  She has no symptoms of atrial fibrillation.  No changes.  2.  Obstructive sleep apnea: CPAP compliance encouraged  3.  Obesity: Diet and exercise encouraged  Current medicines are  reviewed at length with the patient today.   The patient does not have concerns regarding  her medicines.  The following changes were made today: None  Labs/ tests ordered today include:  Orders Placed This Encounter  Procedures  . EKG 12-Lead     Disposition:   FU with Gerard Cantara 6 months  Signed, Alston Berrie Meredith Leeds, MD  04/03/2020 10:01 AM     [facility] [address] [place] [address] [phone] (office) [phone] (fax)

## 2020-04-10 DIAGNOSIS — R202 Paresthesia of skin: Secondary | ICD-10-CM | POA: Diagnosis not present

## 2020-04-10 DIAGNOSIS — L97512 Non-pressure chronic ulcer of other part of right foot with fat layer exposed: Secondary | ICD-10-CM | POA: Diagnosis not present

## 2020-04-10 DIAGNOSIS — R200 Anesthesia of skin: Secondary | ICD-10-CM | POA: Diagnosis not present

## 2020-04-11 DIAGNOSIS — R928 Other abnormal and inconclusive findings on diagnostic imaging of breast: Secondary | ICD-10-CM | POA: Diagnosis not present

## 2020-04-30 DIAGNOSIS — E782 Mixed hyperlipidemia: Secondary | ICD-10-CM | POA: Diagnosis not present

## 2020-04-30 DIAGNOSIS — R7301 Impaired fasting glucose: Secondary | ICD-10-CM | POA: Diagnosis not present

## 2020-05-04 DIAGNOSIS — G4733 Obstructive sleep apnea (adult) (pediatric): Secondary | ICD-10-CM | POA: Diagnosis not present

## 2020-05-08 DIAGNOSIS — E782 Mixed hyperlipidemia: Secondary | ICD-10-CM | POA: Diagnosis not present

## 2020-05-08 DIAGNOSIS — I4891 Unspecified atrial fibrillation: Secondary | ICD-10-CM | POA: Diagnosis not present

## 2020-05-08 DIAGNOSIS — E039 Hypothyroidism, unspecified: Secondary | ICD-10-CM | POA: Diagnosis not present

## 2020-05-08 DIAGNOSIS — N1831 Chronic kidney disease, stage 3a: Secondary | ICD-10-CM | POA: Diagnosis not present

## 2020-05-08 DIAGNOSIS — R7301 Impaired fasting glucose: Secondary | ICD-10-CM | POA: Diagnosis not present

## 2020-05-08 DIAGNOSIS — F329 Major depressive disorder, single episode, unspecified: Secondary | ICD-10-CM | POA: Diagnosis not present

## 2020-05-08 DIAGNOSIS — J449 Chronic obstructive pulmonary disease, unspecified: Secondary | ICD-10-CM | POA: Diagnosis not present

## 2020-05-09 DIAGNOSIS — L97512 Non-pressure chronic ulcer of other part of right foot with fat layer exposed: Secondary | ICD-10-CM | POA: Diagnosis not present

## 2020-05-13 ENCOUNTER — Other Ambulatory Visit: Payer: Self-pay | Admitting: Internal Medicine

## 2020-05-24 DIAGNOSIS — M47816 Spondylosis without myelopathy or radiculopathy, lumbar region: Secondary | ICD-10-CM | POA: Diagnosis not present

## 2020-05-24 DIAGNOSIS — M150 Primary generalized (osteo)arthritis: Secondary | ICD-10-CM | POA: Diagnosis not present

## 2020-05-24 DIAGNOSIS — M75121 Complete rotator cuff tear or rupture of right shoulder, not specified as traumatic: Secondary | ICD-10-CM | POA: Diagnosis not present

## 2020-05-24 DIAGNOSIS — G894 Chronic pain syndrome: Secondary | ICD-10-CM | POA: Diagnosis not present

## 2020-05-30 DIAGNOSIS — L97512 Non-pressure chronic ulcer of other part of right foot with fat layer exposed: Secondary | ICD-10-CM | POA: Diagnosis not present

## 2020-06-06 DIAGNOSIS — F329 Major depressive disorder, single episode, unspecified: Secondary | ICD-10-CM | POA: Diagnosis not present

## 2020-06-06 DIAGNOSIS — E782 Mixed hyperlipidemia: Secondary | ICD-10-CM | POA: Diagnosis not present

## 2020-06-06 DIAGNOSIS — E039 Hypothyroidism, unspecified: Secondary | ICD-10-CM | POA: Diagnosis not present

## 2020-06-06 DIAGNOSIS — R7301 Impaired fasting glucose: Secondary | ICD-10-CM | POA: Diagnosis not present

## 2020-06-18 DIAGNOSIS — R918 Other nonspecific abnormal finding of lung field: Secondary | ICD-10-CM | POA: Diagnosis not present

## 2020-06-20 DIAGNOSIS — M205X1 Other deformities of toe(s) (acquired), right foot: Secondary | ICD-10-CM | POA: Diagnosis not present

## 2020-06-20 DIAGNOSIS — L97512 Non-pressure chronic ulcer of other part of right foot with fat layer exposed: Secondary | ICD-10-CM | POA: Diagnosis not present

## 2020-07-12 DIAGNOSIS — L97512 Non-pressure chronic ulcer of other part of right foot with fat layer exposed: Secondary | ICD-10-CM | POA: Diagnosis not present

## 2020-07-12 DIAGNOSIS — M205X1 Other deformities of toe(s) (acquired), right foot: Secondary | ICD-10-CM | POA: Diagnosis not present

## 2020-07-19 DIAGNOSIS — M47816 Spondylosis without myelopathy or radiculopathy, lumbar region: Secondary | ICD-10-CM | POA: Diagnosis not present

## 2020-07-19 DIAGNOSIS — M150 Primary generalized (osteo)arthritis: Secondary | ICD-10-CM | POA: Diagnosis not present

## 2020-07-19 DIAGNOSIS — G894 Chronic pain syndrome: Secondary | ICD-10-CM | POA: Diagnosis not present

## 2020-07-19 DIAGNOSIS — M75121 Complete rotator cuff tear or rupture of right shoulder, not specified as traumatic: Secondary | ICD-10-CM | POA: Diagnosis not present

## 2020-07-26 ENCOUNTER — Other Ambulatory Visit: Payer: Self-pay | Admitting: Internal Medicine

## 2020-07-26 DIAGNOSIS — R609 Edema, unspecified: Secondary | ICD-10-CM

## 2020-08-01 DIAGNOSIS — J4541 Moderate persistent asthma with (acute) exacerbation: Secondary | ICD-10-CM | POA: Diagnosis not present

## 2020-08-01 DIAGNOSIS — Z79899 Other long term (current) drug therapy: Secondary | ICD-10-CM | POA: Diagnosis not present

## 2020-08-01 DIAGNOSIS — I5030 Unspecified diastolic (congestive) heart failure: Secondary | ICD-10-CM | POA: Diagnosis not present

## 2020-08-01 DIAGNOSIS — E669 Obesity, unspecified: Secondary | ICD-10-CM | POA: Diagnosis not present

## 2020-08-01 DIAGNOSIS — I110 Hypertensive heart disease with heart failure: Secondary | ICD-10-CM | POA: Diagnosis not present

## 2020-08-01 DIAGNOSIS — K219 Gastro-esophageal reflux disease without esophagitis: Secondary | ICD-10-CM | POA: Diagnosis not present

## 2020-08-01 DIAGNOSIS — J449 Chronic obstructive pulmonary disease, unspecified: Secondary | ICD-10-CM | POA: Diagnosis not present

## 2020-08-01 DIAGNOSIS — G4733 Obstructive sleep apnea (adult) (pediatric): Secondary | ICD-10-CM | POA: Diagnosis not present

## 2020-08-01 DIAGNOSIS — R918 Other nonspecific abnormal finding of lung field: Secondary | ICD-10-CM | POA: Diagnosis not present

## 2020-08-02 DIAGNOSIS — G4733 Obstructive sleep apnea (adult) (pediatric): Secondary | ICD-10-CM | POA: Diagnosis not present

## 2020-08-06 DIAGNOSIS — L97512 Non-pressure chronic ulcer of other part of right foot with fat layer exposed: Secondary | ICD-10-CM | POA: Diagnosis not present

## 2020-08-06 DIAGNOSIS — E11621 Type 2 diabetes mellitus with foot ulcer: Secondary | ICD-10-CM | POA: Diagnosis not present

## 2020-08-09 NOTE — Telephone Encounter (Signed)
Will If OK with you, I would favor she see you to review anticoagulation use  Can cancel with me   I am happy to follow after

## 2020-08-13 DIAGNOSIS — L97512 Non-pressure chronic ulcer of other part of right foot with fat layer exposed: Secondary | ICD-10-CM | POA: Diagnosis not present

## 2020-08-27 DIAGNOSIS — M205X1 Other deformities of toe(s) (acquired), right foot: Secondary | ICD-10-CM | POA: Diagnosis not present

## 2020-08-27 DIAGNOSIS — L97512 Non-pressure chronic ulcer of other part of right foot with fat layer exposed: Secondary | ICD-10-CM | POA: Diagnosis not present

## 2020-08-30 DIAGNOSIS — E782 Mixed hyperlipidemia: Secondary | ICD-10-CM | POA: Diagnosis not present

## 2020-08-30 DIAGNOSIS — E099 Drug or chemical induced diabetes mellitus without complications: Secondary | ICD-10-CM | POA: Diagnosis not present

## 2020-08-30 DIAGNOSIS — R7301 Impaired fasting glucose: Secondary | ICD-10-CM | POA: Diagnosis not present

## 2020-09-04 DIAGNOSIS — E039 Hypothyroidism, unspecified: Secondary | ICD-10-CM | POA: Diagnosis not present

## 2020-09-04 DIAGNOSIS — J449 Chronic obstructive pulmonary disease, unspecified: Secondary | ICD-10-CM | POA: Diagnosis not present

## 2020-09-04 DIAGNOSIS — K219 Gastro-esophageal reflux disease without esophagitis: Secondary | ICD-10-CM | POA: Diagnosis not present

## 2020-09-04 DIAGNOSIS — I4891 Unspecified atrial fibrillation: Secondary | ICD-10-CM | POA: Diagnosis not present

## 2020-09-04 DIAGNOSIS — F329 Major depressive disorder, single episode, unspecified: Secondary | ICD-10-CM | POA: Diagnosis not present

## 2020-09-04 DIAGNOSIS — E782 Mixed hyperlipidemia: Secondary | ICD-10-CM | POA: Diagnosis not present

## 2020-09-04 DIAGNOSIS — R7301 Impaired fasting glucose: Secondary | ICD-10-CM | POA: Diagnosis not present

## 2020-09-10 ENCOUNTER — Other Ambulatory Visit: Payer: Self-pay | Admitting: Internal Medicine

## 2020-09-10 NOTE — Telephone Encounter (Signed)
Pt's age 70, wt 117.9 kg, SCr 1.11, CrCl 91.54, last ov w/ WC 04/03/20.

## 2020-09-13 DIAGNOSIS — M150 Primary generalized (osteo)arthritis: Secondary | ICD-10-CM | POA: Diagnosis not present

## 2020-09-13 DIAGNOSIS — M75121 Complete rotator cuff tear or rupture of right shoulder, not specified as traumatic: Secondary | ICD-10-CM | POA: Diagnosis not present

## 2020-09-13 DIAGNOSIS — M47816 Spondylosis without myelopathy or radiculopathy, lumbar region: Secondary | ICD-10-CM | POA: Diagnosis not present

## 2020-09-13 DIAGNOSIS — G894 Chronic pain syndrome: Secondary | ICD-10-CM | POA: Diagnosis not present

## 2020-09-17 DIAGNOSIS — L97512 Non-pressure chronic ulcer of other part of right foot with fat layer exposed: Secondary | ICD-10-CM | POA: Diagnosis not present

## 2020-10-01 DIAGNOSIS — G4733 Obstructive sleep apnea (adult) (pediatric): Secondary | ICD-10-CM | POA: Diagnosis not present

## 2020-10-04 DIAGNOSIS — R0609 Other forms of dyspnea: Secondary | ICD-10-CM | POA: Diagnosis not present

## 2020-10-08 DIAGNOSIS — E11621 Type 2 diabetes mellitus with foot ulcer: Secondary | ICD-10-CM | POA: Diagnosis not present

## 2020-10-08 DIAGNOSIS — L97512 Non-pressure chronic ulcer of other part of right foot with fat layer exposed: Secondary | ICD-10-CM | POA: Diagnosis not present

## 2020-10-10 DIAGNOSIS — I5030 Unspecified diastolic (congestive) heart failure: Secondary | ICD-10-CM | POA: Diagnosis not present

## 2020-10-10 DIAGNOSIS — J4541 Moderate persistent asthma with (acute) exacerbation: Secondary | ICD-10-CM | POA: Diagnosis not present

## 2020-10-10 DIAGNOSIS — E669 Obesity, unspecified: Secondary | ICD-10-CM | POA: Diagnosis not present

## 2020-10-10 DIAGNOSIS — Z79899 Other long term (current) drug therapy: Secondary | ICD-10-CM | POA: Diagnosis not present

## 2020-10-10 DIAGNOSIS — J449 Chronic obstructive pulmonary disease, unspecified: Secondary | ICD-10-CM | POA: Diagnosis not present

## 2020-10-10 DIAGNOSIS — Z87891 Personal history of nicotine dependence: Secondary | ICD-10-CM | POA: Diagnosis not present

## 2020-10-10 DIAGNOSIS — R911 Solitary pulmonary nodule: Secondary | ICD-10-CM | POA: Diagnosis not present

## 2020-10-10 DIAGNOSIS — I110 Hypertensive heart disease with heart failure: Secondary | ICD-10-CM | POA: Diagnosis not present

## 2020-10-10 DIAGNOSIS — Z9989 Dependence on other enabling machines and devices: Secondary | ICD-10-CM | POA: Diagnosis not present

## 2020-10-10 DIAGNOSIS — R0600 Dyspnea, unspecified: Secondary | ICD-10-CM | POA: Diagnosis not present

## 2020-10-10 DIAGNOSIS — G4733 Obstructive sleep apnea (adult) (pediatric): Secondary | ICD-10-CM | POA: Diagnosis not present

## 2020-10-10 DIAGNOSIS — Z6841 Body Mass Index (BMI) 40.0 and over, adult: Secondary | ICD-10-CM | POA: Diagnosis not present

## 2020-10-10 DIAGNOSIS — K219 Gastro-esophageal reflux disease without esophagitis: Secondary | ICD-10-CM | POA: Diagnosis not present

## 2020-10-16 ENCOUNTER — Ambulatory Visit: Payer: PPO | Admitting: Internal Medicine

## 2020-10-18 ENCOUNTER — Ambulatory Visit: Payer: PPO | Admitting: Cardiology

## 2020-10-18 ENCOUNTER — Other Ambulatory Visit: Payer: Self-pay

## 2020-10-18 ENCOUNTER — Encounter: Payer: Self-pay | Admitting: Cardiology

## 2020-10-18 VITALS — BP 126/62 | HR 64 | Ht 66.0 in | Wt 251.0 lb

## 2020-10-18 DIAGNOSIS — I480 Paroxysmal atrial fibrillation: Secondary | ICD-10-CM

## 2020-10-18 NOTE — Progress Notes (Signed)
Electrophysiology Office Note   Date:  10/18/2020   ID:  Taylor [name], DOB 01-16-1951, MRN [id number]  PCP:  Larene Beach, MD  Cardiologist:  Harrington Challenger Primary Electrophysiologist:  Avary Eichenberger Meredith Leeds, MD    Chief Complaint: AF   History of Present Illness: Taylor [name] is a 70 y.o. female who is being seen today for the evaluation of AF at the request of Larene Beach, MD. Presenting today for electrophysiology evaluation.  She has a history significant for atrial fibrillation and COPD.  Her atrial fibrillation was diagnosed in 2009 possibly due to a URI.  She was drinking coffee around the holidays and had more frequent episodes of atrial fibrillation.  She is now status post AF ablation 09/21/2019.  Today, denies symptoms of palpitations, chest pain, shortness of breath, orthopnea, PND, lower extremity edema, claudication, dizziness, presyncope, syncope, bleeding, or neurologic sequela. The patient is tolerating medications without difficulties.  Since being seen she has done well.  She has no chest pain or shortness of breath.  She is able do all of her daily activities and is without restriction.  Unfortunately she has a wound on her right foot that is slow to heal and she is in a boot to keep pressure off of it.  Otherwise she would like to stop her Xarelto as she is reaching the donut hole and Taylor [name] have trouble affording it.  Past Medical History:  Diagnosis Date  . Anxiety   . Arthritis    "hands, neck, back" (07/22/2013)  . Atrial fibrillation ([place])   . CHEST DISCOMFORT   . Chronic back pain    "started in lower back; getting to be all over my back" (07/22/2013)  . Chronic bronchitis ([place])    "I've had it 2 years in a row; do have some trouble breathing at times; use an inhaler prn; not asthma" (07/22/2013)  . Chronic neck pain   . COPD (chronic obstructive pulmonary disease) ([place])   . DDD (degenerative disc disease), cervical   . DDD (degenerative disc disease), lumbar    . Depression   . Dysrhythmia   . GERD (gastroesophageal reflux disease)   . Hepatitis 1971   "don't know if it was A or B" (07/22/2013)  . HYPERLIPIDEMIA-MIXED 2005   "went away after I started taking fish oil" (07/22/2013)  . Hypothyroidism   . Palpitations   . Pneumonia 2013  . Type II diabetes mellitus ([place])    "diet controlled" (07/22/2013)   Past Surgical History:  Procedure Laterality Date  . ANTERIOR CERVICAL DECOMP/DISCECTOMY FUSION  ?2003   "w/plating" (07/22/2013)  . ATRIAL FIBRILLATION ABLATION N/A 09/21/2019   Procedure: ATRIAL FIBRILLATION ABLATION;  Surgeon: Constance Haw, MD;  Location: [facility];  Service: Cardiovascular;  Laterality: N/A;  . BUNIONECTOMY Left 2013  . DILATION AND CURETTAGE OF UTERUS  1976  . PLANTAR FASCIA SURGERY Right 2011     Current Outpatient Medications  Medication Sig Dispense Refill  . acetaminophen (TYLENOL) 500 MG tablet Take 1,000 mg by mouth every 6 (six) hours as needed for moderate pain or headache.     . albuterol (PROVENTIL HFA;VENTOLIN HFA) 108 (90 BASE) MCG/ACT inhaler Inhale 1 puff into the lungs every 6 (six) hours as needed for wheezing or shortness of breath. 1 Inhaler 1  . ALPRAZolam (XANAX) 0.25 MG tablet Take 0.25 mg by mouth 2 (two) times daily as needed for anxiety.    . Ascorbic Acid (VITAMIN C) 1000 MG tablet Take 1,000  mg by mouth once a week.     Marland Kitchen buPROPion (WELLBUTRIN XL) 150 MG 24 hr tablet Take 300 mg by mouth daily.     . cetirizine (ZYRTEC) 10 MG tablet Take 10 mg by mouth daily.     . chlorhexidine (PERIDEX) 0.12 % solution Use as directed 15 mLs in the mouth or throat See admin instructions. Swish with 15 mls for 2 minutes then spit when needed for mouth sores    . Cholecalciferol (VITAMIN D3) 5000 UNITS CAPS Take 5,000 mg by mouth daily.    . Cinnamon 500 MG TABS Take 1,000 mg by mouth daily.    Marland Kitchen ESTRADIOL TD Place 1 mL onto the skin daily. Estradiol 7.5mg /gm cream    . Fluticasone-Salmeterol  (ADVAIR) 500-50 MCG/DOSE AEPB Inhale 1 puff into the lungs in the morning and at bedtime.     . furosemide (LASIX) 80 MG tablet Take 1 tablet (80 mg total) by mouth daily. Please keep upcoming appt in May 2082m with Dr. Harrington Challenger before anymore refills. Thank you 90 tablet 0  . Guaifenesin 1200 MG TB12 Take 1,200 mg by mouth daily.    . hydrOXYzine (ATARAX/VISTARIL) 50 MG tablet Take 50 mg by mouth daily as needed for itching.     Marland Kitchen ketoconazole (NIZORAL) 2 % cream Apply 1 application topically daily.     . metoprolol tartrate (LOPRESSOR) 25 MG tablet Take 1 tablet (25 mg total) by mouth 2 (two) times daily. 180 tablet 3  . montelukast (SINGULAIR) 10 MG tablet Take 10 mg by mouth at bedtime.    . OIL OF OREGANO PO Take 1-2 capsules by mouth daily as needed (cold symptoms).     . Omega-3 Fatty Acids (FISH OIL) 1000 MG CAPS Take 2,000 mg by mouth daily.    Marland Kitchen omeprazole (PRILOSEC) 20 MG capsule Take 20 mg by mouth daily.     . ondansetron (ZOFRAN) 8 MG tablet Take 8 mg by mouth every 8 (eight) hours as needed for nausea or vomiting.    Marland Kitchen oxyCODONE (ROXICODONE) 15 MG immediate release tablet Take 15 mg by mouth 4 (four) times daily.     . potassium chloride (KLOR-CON M10) 10 MEQ tablet Take 1 tablet (10 mEq total) by mouth daily. 90 tablet 3  . pregabalin (LYRICA) 75 MG capsule Take 75 mg by mouth 3 (three) times daily.     . progesterone (PROMETRIUM) 100 MG capsule Take 300 mg by mouth every evening.     . TESTOSTERONE TD Place 1-2 application onto the skin See admin instructions. Testosterone 2% cream. Apply 1 click's worth of testosterone cream on Sun, Tues, Thurs, and Sat. Apply 2 click's worth on Mon, Wed, and Fri    . thyroid (ARMOUR) 60 MG tablet Take 30-120 mg by mouth See admin instructions. Take 120 mg in the morning and 30 mg in the afternoon    . tiZANidine (ZANAFLEX) 4 MG tablet Take 2 mg by mouth 4 (four) times daily as needed for muscle spasms.     . TURMERIC PO Take 1,440 mg by mouth every  evening.    . Vitamin A 2400 MCG (8000 UT) CAPS Take 2,400 mcg by mouth daily.    . Vitamin D-Vitamin K (VITAMIN K2-VITAMIN D3 PO) Take 1 capsule by mouth every evening.    . escitalopram (LEXAPRO) 10 MG tablet Take 10 mg by mouth daily.      No current facility-administered medications for this visit.    Allergies:   Avelox [  moxifloxacin hcl in nacl], Linezolid, Venlafaxine, Rosuvastatin, Clindamycin, Statins, Cephalexin, Nitrofurantoin, Other, and Penicillins   Social History:  The patient  reports that she quit smoking about 12 years ago. Her smoking use included cigarettes. She has a 40.00 pack-year smoking history. She has never used smokeless tobacco. She reports previous alcohol use. She reports that she does not use drugs.   Family History:  The patient's family history includes Diabetes in her maternal grandfather; Heart attack in her paternal grandfather; Heart disease in an other family member.   ROS:  Please see the history of present illness.   Otherwise, review of systems is positive for none.   All other systems are reviewed and negative.   PHYSICAL EXAM: VS:  BP 126/62   Pulse 64   Ht 5\' 6"  (1.676 m)   Wt 251 lb (113.9 kg)   BMI 40.51 kg/m  , BMI Body mass index is 40.51 kg/m. GEN: Well nourished, well developed, in no acute distress  HEENT: normal  Neck: no JVD, carotid bruits, or masses Cardiac: RRR; no murmurs, rubs, or gallops,no edema  Respiratory:  clear to auscultation bilaterally, normal work of breathing GI: soft, nontender, nondistended, + BS MS: no deformity or atrophy  Skin: warm and dry Neuro:  Strength and sensation are intact Psych: euthymic mood, full affect  EKG:  EKG is ordered today. Personal review of the ekg ordered shows sinus rhythm, rate 64  Recent Labs: No results found for requested labs within last 8760 hours.    Lipid Panel     Component Value Date/Time   CHOL 210 (H) 04/25/2019 1543   TRIG 159 (H) 04/25/2019 1543   HDL 54  04/25/2019 1543   CHOLHDL 3.9 04/25/2019 1543   CHOLHDL 4.2 CALC 11/17/2007 0844   VLDL 18 11/17/2007 0844   LDLCALC 128 (H) 04/25/2019 1543     Wt Readings from Last 3 Encounters:  10/18/20 251 lb (113.9 kg)  04/03/20 260 lb (117.9 kg)  12/26/19 260 lb (117.9 kg)      Other studies Reviewed: Additional studies/ records that were reviewed today include: TTE 09/14/17  Review of the above records today demonstrates:  - Left ventricle: The cavity size was normal. Wall thickness was   increased in a pattern of mild LVH. Systolic function was normal.   The estimated ejection fraction was in the range of 55% to 60%.   Wall motion was normal; there were no regional wall motion   abnormalities. Features are consistent with a pseudonormal left   ventricular filling pattern, with concomitant abnormal relaxation   and increased filling pressure (grade 2 diastolic dysfunction).   Doppler parameters are consistent with high ventricular filling   pressure. - Left atrium: The atrium was mildly dilated. - Pulmonary arteries: Systolic pressure was mildly increased. PA   peak pressure: 37 mm Hg (S).  Monitor 11/22/18 personally reviewed SInus rhythm   NO signifcant arrhythmias detected Patient did not appear to have any symptoms while wearing  ASSESSMENT AND PLAN:  1.  Paroxysmal atrial fibrillation: Currently on Xarelto with a CHA2DS2-VASc of 3.  Status post ablation 09/21/2019.  Flecainide has since been stopped.  She is currently feeling well.  She is noted no further episodes of atrial fibrillation.  She would like to stop her Xarelto as she has not had any further episodes of atrial fibrillation.  2.  Obstructive sleep apnea: CPAP compliance encouraged  3.  Obesity: Diet and exercise encouraged  Current medicines are reviewed at length  with the patient today.   The patient does not have concerns regarding her medicines.  The following changes were made today: Stop Xarelto  Labs/ tests  ordered today include:  Orders Placed This Encounter  Procedures  . EKG 12-Lead     Disposition:   FU with Navi [place] 6 months  Signed, Krystan Northrop Meredith Leeds, MD  10/18/2020 10:44 AM     [facility] [address] [place] [address] [phone] (office) [phone] (fax)

## 2020-10-18 NOTE — Patient Instructions (Signed)
Medication Instructions:  Your physician has recommended you make the following change in your medication:  1. STOP Xarelto  *If you need a refill on your cardiac medications before your next appointment, please call your pharmacy*   Lab Work: None ordered   Testing/Procedures: None ordered   Follow-Up: At [facility], you and your health needs are our priority.  As part of our continuing mission to provide you with exceptional heart care, we have created designated Provider Care Teams.  These Care Teams include your primary Cardiologist (physician) and Advanced Practice Providers (APPs -  Physician Assistants and Nurse Practitioners) who all work together to provide you with the care you need, when you need it.   Your next appointment:   6 month(s)  The format for your next appointment:   In Person  Provider:   You will see one of the following Advanced Practice Providers on your designated Care Team:    Chanetta Marshall, NP  Tommye Standard, PA-C  Legrand Como "[place]" [place], [place]   Thank you for choosing [facility]!!   Trinidad Curet, RN [phone]

## 2020-10-22 DIAGNOSIS — L97512 Non-pressure chronic ulcer of other part of right foot with fat layer exposed: Secondary | ICD-10-CM | POA: Diagnosis not present

## 2020-11-06 DIAGNOSIS — M205X1 Other deformities of toe(s) (acquired), right foot: Secondary | ICD-10-CM | POA: Diagnosis not present

## 2020-11-06 DIAGNOSIS — L97512 Non-pressure chronic ulcer of other part of right foot with fat layer exposed: Secondary | ICD-10-CM | POA: Diagnosis not present

## 2020-11-06 DIAGNOSIS — M2011 Hallux valgus (acquired), right foot: Secondary | ICD-10-CM | POA: Diagnosis not present

## 2020-11-08 DIAGNOSIS — G894 Chronic pain syndrome: Secondary | ICD-10-CM | POA: Diagnosis not present

## 2020-11-08 DIAGNOSIS — M75121 Complete rotator cuff tear or rupture of right shoulder, not specified as traumatic: Secondary | ICD-10-CM | POA: Diagnosis not present

## 2020-11-08 DIAGNOSIS — M150 Primary generalized (osteo)arthritis: Secondary | ICD-10-CM | POA: Diagnosis not present

## 2020-11-08 DIAGNOSIS — M47816 Spondylosis without myelopathy or radiculopathy, lumbar region: Secondary | ICD-10-CM | POA: Diagnosis not present

## 2020-11-19 ENCOUNTER — Other Ambulatory Visit: Payer: Self-pay | Admitting: Internal Medicine

## 2020-11-19 DIAGNOSIS — R609 Edema, unspecified: Secondary | ICD-10-CM

## 2020-11-20 ENCOUNTER — Other Ambulatory Visit: Payer: Self-pay

## 2020-11-20 DIAGNOSIS — M205X1 Other deformities of toe(s) (acquired), right foot: Secondary | ICD-10-CM | POA: Diagnosis not present

## 2020-11-20 DIAGNOSIS — R609 Edema, unspecified: Secondary | ICD-10-CM

## 2020-11-20 DIAGNOSIS — L97512 Non-pressure chronic ulcer of other part of right foot with fat layer exposed: Secondary | ICD-10-CM | POA: Diagnosis not present

## 2020-11-21 MED ORDER — FUROSEMIDE 80 MG PO TABS: 80.0000 mg | ORAL_TABLET | Freq: Every day | ORAL | 0 refills | Status: DC

## 2020-12-04 DIAGNOSIS — M205X1 Other deformities of toe(s) (acquired), right foot: Secondary | ICD-10-CM | POA: Diagnosis not present

## 2020-12-04 DIAGNOSIS — L97512 Non-pressure chronic ulcer of other part of right foot with fat layer exposed: Secondary | ICD-10-CM | POA: Diagnosis not present

## 2020-12-16 ENCOUNTER — Emergency Department (INDEPENDENT_AMBULATORY_CARE_PROVIDER_SITE_OTHER): Payer: PPO

## 2020-12-16 ENCOUNTER — Emergency Department
Admission: EM | Admit: 2020-12-16 | Discharge: 2020-12-16 | Disposition: A | Discharge status: Home / Self Care | Payer: PPO | Source: Home / Self Care | Attending: Family Medicine | Admitting: Family Medicine

## 2020-12-16 ENCOUNTER — Other Ambulatory Visit: Payer: Self-pay

## 2020-12-16 ENCOUNTER — Encounter: Payer: Self-pay | Admitting: Emergency Medicine

## 2020-12-16 DIAGNOSIS — J189 Pneumonia, unspecified organism: Secondary | ICD-10-CM | POA: Diagnosis not present

## 2020-12-16 DIAGNOSIS — R0602 Shortness of breath: Secondary | ICD-10-CM | POA: Diagnosis not present

## 2020-12-16 DIAGNOSIS — R5383 Other fatigue: Secondary | ICD-10-CM | POA: Diagnosis not present

## 2020-12-16 DIAGNOSIS — R635 Abnormal weight gain: Secondary | ICD-10-CM

## 2020-12-16 DIAGNOSIS — R509 Fever, unspecified: Secondary | ICD-10-CM

## 2020-12-16 DIAGNOSIS — I517 Cardiomegaly: Secondary | ICD-10-CM | POA: Diagnosis not present

## 2020-12-16 DIAGNOSIS — J811 Chronic pulmonary edema: Secondary | ICD-10-CM | POA: Diagnosis not present

## 2020-12-16 MED ORDER — LEVOFLOXACIN 750 MG PO TABS: 750.0000 mg | ORAL_TABLET | Freq: Every day | ORAL | 0 refills | Status: DC

## 2020-12-16 NOTE — Discharge Instructions (Addendum)
Rest and drink plenty of fluids Take antibiotic as prescribed.  I checked your medical records and you have been given Levaquin and Cipro many times.  I see no allergy to these drugs For minor itching or symptoms take Benadryl If you have an allergic reaction call here or to your primary care doctor Consider taking a probiotic with your antibiotic to protect your stomach See your primary care provider in follow-up

## 2020-12-16 NOTE — ED Provider Notes (Signed)
[name]    CSN: [id number] Arrival date & time: 12/16/20  1347      History   Chief Complaint Chief Complaint  Patient presents with   Fever    HPI Taylor [name] is a 70 y.o. female.   HPI  Patient states that she has COPD.  She states she has had recurring pneumonia.  She states that she is feeling like she may have an pneumonia at this time.  She has documented pneumonia when she has been at this office on previous occasions.  She states that she has shortness of breath.  Fatigue.  Feels clammy.  She states her head feels "fuzzy".  She states these are signs of pneumonia.  She also worries that she is think she has gained 10 pounds without eating more in the last couple of weeks.  Denies pedal edema.  Denies history of heart failure or fluid overload.  She does states that she has stopped taking her Lasix recently.  I encouraged her to go back on this. Patient states that she is COVID vaccinated.  She does not think she has COVID.   Past Medical History:  Diagnosis Date   Anxiety    Arthritis    "hands, neck, back" (07/22/2013)   Atrial fibrillation ([place])    CHEST DISCOMFORT    Chronic back pain    "started in lower back; getting to be all over my back" (07/22/2013)   Chronic bronchitis ([place])    "I've had it 2 years in a row; do have some trouble breathing at times; use an inhaler prn; not asthma" (07/22/2013)   Chronic neck pain    COPD (chronic obstructive pulmonary disease) ([facility])    DDD (degenerative disc disease), cervical    DDD (degenerative disc disease), lumbar    Depression    Dysrhythmia    GERD (gastroesophageal reflux disease)    Hepatitis 1971   "don't know if it was A or B" (07/22/2013)   HYPERLIPIDEMIA-MIXED 2005   "went away after I started taking fish oil" (07/22/2013)   Hypothyroidism    Palpitations    Pneumonia 2013   Type II diabetes mellitus ([place])    "diet controlled" (07/22/2013)    Patient Active Problem List   Diagnosis Date  Noted   Secondary hypercoagulable state ([place]) 10/19/2019   Pleuritis 07/25/2013   CAP (community acquired pneumonia) 07/24/2013   COPD (chronic obstructive pulmonary disease) ([place]) 07/24/2013   Tobacco use 07/24/2013   Pneumonia 07/24/2013   Lung mass 07/24/2013   Pleural effusion 07/24/2013   UTI (urinary tract infection) 07/22/2013   Weakness 07/22/2013   HTN (hypertension) 07/22/2013   Leukocytosis 07/22/2013   Hyponatremia 07/22/2013   Acute renal failure ([place]) 07/22/2013   Atrial fibrillation ([place]) 08/06/2008    Past Surgical History:  Procedure Laterality Date   ANTERIOR CERVICAL DECOMP/DISCECTOMY FUSION  ?2003   "w/plating" (07/22/2013)   ATRIAL FIBRILLATION ABLATION N/A 09/21/2019   Procedure: ATRIAL FIBRILLATION ABLATION;  Surgeon: Constance Haw, MD;  Location: [place] [facility];  Service: Cardiovascular;  Laterality: N/A;   BUNIONECTOMY Left 2013   DILATION AND CURETTAGE OF UTERUS  1976   PLANTAR FASCIA SURGERY Right 2011    OB History   No obstetric history on file.      Home Medications    Prior to Admission medications   Medication Sig Start Date End Date Taking? Authorizing Provider  acetaminophen (TYLENOL) 500 MG tablet Take 1,000 mg by mouth every 6 (six)  hours as needed for moderate pain or headache.    Yes [provider]  albuterol (PROVENTIL HFA;VENTOLIN HFA) 108 (90 BASE) MCG/ACT inhaler Inhale 1 puff into the lungs every 6 (six) hours as needed for wheezing or shortness of breath. 08/01/13  Yes Buriev, Arie Sabina, MD  ALPRAZolam Duanne Moron) 0.25 MG tablet Take 0.25 mg by mouth 2 (two) times daily as needed for anxiety. 11/29/11  Yes [provider]  Ascorbic Acid (VITAMIN C) 1000 MG tablet Take 1,000 mg by mouth once a week.  11/15/16  Yes [provider]  buPROPion (WELLBUTRIN XL) 150 MG 24 hr tablet Take 300 mg by mouth daily.  11/19/11  Yes [provider]  cetirizine (ZYRTEC) 10 MG tablet Take 10 mg by mouth daily.     Yes [provider]  chlorhexidine (PERIDEX) 0.12 % solution Use as directed 15 mLs in the mouth or throat See admin instructions. Swish with 15 mls for 2 minutes then spit when needed for mouth sores 02/26/18  Yes [provider]  Cholecalciferol (VITAMIN D3) 5000 UNITS CAPS Take 5,000 mg by mouth daily.   Yes [provider]  Cinnamon 500 MG TABS Take 1,000 mg by mouth daily.   Yes [provider]  ESTRADIOL TD Place 1 mL onto the skin daily. Estradiol 7.5mg /gm cream   Yes [provider]  Fluticasone-Salmeterol (ADVAIR) 500-50 MCG/DOSE AEPB Inhale 1 puff into the lungs in the morning and at bedtime.  06/30/18  Yes [provider]  furosemide (LASIX) 80 MG tablet Take 1 tablet (80 mg total) by mouth daily. Please keep upcoming appt in May 2043m with Dr. Harrington Challenger before anymore refills. Thank you 11/21/20  Yes Fay Records, MD  Guaifenesin 1200 MG TB12 Take 1,200 mg by mouth daily.   Yes [provider]  hydrOXYzine (ATARAX/VISTARIL) 50 MG tablet Take 50 mg by mouth daily as needed for itching.    Yes [provider]  ketoconazole (NIZORAL) 2 % cream Apply 1 application topically daily.  03/26/18  Yes [provider]  levofloxacin (LEVAQUIN) 750 MG tablet Take 1 tablet (750 mg total) by mouth daily. 12/16/20  Yes Raylene Everts, MD  metoprolol tartrate (LOPRESSOR) 25 MG tablet Take 1 tablet (25 mg total) by mouth 2 (two) times daily. 05/15/20  Yes Fay Records, MD  montelukast (SINGULAIR) 10 MG tablet Take 10 mg by mouth at bedtime.   Yes [provider]  OIL OF OREGANO PO Take 1-2 capsules by mouth daily as needed (cold symptoms).    Yes [provider]  Omega-3 Fatty Acids (FISH OIL) 1000 MG CAPS Take 2,000 mg by mouth daily.   Yes [provider]  omeprazole (PRILOSEC) 20 MG capsule Take 20 mg by mouth daily.    Yes [provider]  ondansetron (ZOFRAN) 8 MG tablet Take 8 mg by  mouth every 8 (eight) hours as needed for nausea or vomiting.   Yes [provider]  oxyCODONE (ROXICODONE) 15 MG immediate release tablet Take 15 mg by mouth 4 (four) times daily.    Yes [provider]  potassium chloride (KLOR-CON M10) 10 MEQ tablet Take 1 tablet (10 mEq total) by mouth daily. 12/14/18  Yes Fay Records, MD  pregabalin (LYRICA) 75 MG capsule Take 75 mg by mouth 3 (three) times daily.    Yes [provider]  progesterone (PROMETRIUM) 100 MG capsule Take 300 mg by mouth every evening.  11/03/11  Yes [provider]  TESTOSTERONE TD Place 1-2 application onto the skin See admin instructions. Testosterone 2% cream. Apply 1 click's worth of testosterone cream on Sun, Tues, Thurs, and Sat. Apply 2 click's worth on Mon, Wed, and Fri   Yes [provider]  thyroid (ARMOUR) 60 MG tablet Take 30-120 mg by mouth See admin instructions. Take 120 mg in the morning and 30 mg in the afternoon   Yes [provider]  tiZANidine (ZANAFLEX) 4 MG tablet Take 2 mg by mouth 4 (four) times daily as needed for muscle spasms.    Yes [provider]  TURMERIC PO Take 1,440 mg by mouth every evening.   Yes [provider]  Vitamin A 2400 MCG (8000 UT) CAPS Take 2,400 mcg by mouth daily.   Yes [provider]  Vitamin D-Vitamin K (VITAMIN K2-VITAMIN D3 PO) Take 1 capsule by mouth every evening.   Yes [provider]  escitalopram (LEXAPRO) 10 MG tablet Take 10 mg by mouth daily.  07/30/18 12/26/19  [provider]    Family History Family History  Problem Relation Age of Onset   Heart disease Other    Diabetes Maternal Grandfather    Heart attack Paternal Grandfather     Social History Social History   Tobacco Use   Smoking status: Former    Packs/day: 1.00    Years: 40.00    Pack years: 40.00    Types: Cigarettes    Quit date: 12/22/2007    Years since quitting: 12.9   Smokeless tobacco: Never   Substance Use Topics   Alcohol use: Not Currently    Comment: 07/22/2013 "last drink was in ~ 2005"   Drug use: No     Allergies   Avelox [moxifloxacin hcl in nacl], Linezolid, Venlafaxine, Rosuvastatin, Clindamycin, Statins, Cephalexin, Nitrofurantoin, Other, and Penicillins   Review of Systems Review of Systems See HPI  Physical Exam Triage Vital Signs ED Triage Vitals  Enc Vitals Group     BP 12/16/20 1403 (!) 150/51     Pulse Rate 12/16/20 1403 82     Resp 12/16/20 1403 (!) 26     Temp 12/16/20 1403 (!) 97.5 F (36.4 C)     Temp Source 12/16/20 1403 Oral     SpO2 12/16/20 1403 96 %     Weight 12/16/20 1404 256 lb (116.1 kg)     Height --      Head Circumference --      Peak Flow --      Pain Score 12/16/20 1404 0     Pain Loc --      Pain Edu? --      Excl. in [place]? --    No data found.  Updated Vital Signs BP (!) 150/51 (BP Location: Left Arm)   Pulse 82   Temp (!) 97.5 F (36.4 C) (Oral)   Resp (!) 26   Wt 116.1 kg   SpO2 96%   BMI 41.32 kg/m     Physical Exam Constitutional:      General: She is not in acute distress.    Appearance: She is well-developed. She is obese. She is ill-appearing.  HENT:     Head: Normocephalic and atraumatic.     Right Ear: Tympanic membrane and ear canal normal.     Left Ear: Tympanic membrane and ear canal normal.     Nose: Nose normal. No congestion.     Mouth/Throat:     Mouth: Mucous membranes are moist.  Pharynx: No posterior oropharyngeal erythema.  Eyes:     Conjunctiva/sclera: Conjunctivae normal.     Pupils: Pupils are equal, round, and reactive to light.  Cardiovascular:     Rate and Rhythm: Normal rate and regular rhythm.     Heart sounds: Normal heart sounds.  Pulmonary:     Effort: Pulmonary effort is normal. No respiratory distress.     Breath sounds: Rhonchi and rales present.     Comments: Few anterior rhonchi.  Rales in both upper lung bases Abdominal:     General: There is no distension.      Palpations: Abdomen is soft.  Musculoskeletal:        General: Normal range of motion.     Cervical back: Normal range of motion and neck supple.     Right lower leg: Edema present.     Left lower leg: Edema present.  Lymphadenopathy:     Cervical: No cervical adenopathy.  Skin:    General: Skin is warm and dry.  Neurological:     Mental Status: She is alert.  Psychiatric:        Mood and Affect: Mood normal.        Behavior: Behavior normal.     UC Treatments / Results  Labs (all labs ordered are listed, but only abnormal results are displayed) Labs Reviewed - No data to display  EKG   Radiology DG Chest 2 View  Result Date: 12/16/2020 CLINICAL DATA:  Shortness of breath, fever, fatigue, 10 lb weight gain this week. EXAM: CHEST - 2 VIEW COMPARISON:  05/14/2018 FINDINGS: Stable enlarged cardiac silhouette and prominent pulmonary vasculature and interstitial markings. Patchy opacity in the medial aspect of the right upper lobe. No pleural fluid. Lower thoracic spine degenerative changes. Cervical spine fixation hardware. IMPRESSION: 1. Right upper lobe pneumonia. 2. Stable cardiomegaly, pulmonary vascular congestion and chronic interstitial lung disease. Electronically Signed   By: Claudie Revering M.D.   On: 12/16/2020 14:45    Procedures Procedures (including critical care time)  Medications Ordered in UC Medications - No data to display  Initial Impression / Assessment and Plan / UC Course  I have reviewed the triage vital signs and the nursing notes.  Pertinent labs & imaging results that were available during my care of the patient were reviewed by me and considered in my medical decision making (see chart for details).     Patient has multiple drug allergies.  I reviewed her old chart and it appears she has been taking again Levaquin or Cipro many times without problems.  She declines penicillins, cephalosporins, azithromycin, Septra, Avelox, clindamycin and  nitrofurantoin.  Unclear how many true allergies she actually has Final Clinical Impressions(s) / UC Diagnoses   Final diagnoses:  Community acquired pneumonia of right upper lobe of lung     Discharge Instructions      Rest and drink plenty of fluids Take antibiotic as prescribed.  I checked your medical records and you have been given Levaquin and Cipro many times.  I see no allergy to these drugs For minor itching or symptoms take Benadryl If you have an allergic reaction call here or to your primary care doctor Consider taking a probiotic with your antibiotic to protect your stomach See your primary care provider in follow-up   ED Prescriptions     Medication Sig Dispense Auth. Provider   levofloxacin (LEVAQUIN) 750 MG tablet Take 1 tablet (750 mg total) by mouth daily. 7 tablet Raylene Everts, MD  PDMP not reviewed this encounter.   Raylene Everts, MD 12/16/20 [phone]

## 2020-12-16 NOTE — ED Triage Notes (Signed)
Patient states that she ran a fever on Friday of 100.4, patient is feeling week, head is "fuzzy", SOB.  Patient feels that she's gained 10lbs in a week.  Home COVID test was negative.  Patient is vaccinated for COVID.  Patient's weight last week was 246lbs and today it's 256#.

## 2020-12-18 ENCOUNTER — Telehealth: Payer: Self-pay

## 2020-12-18 NOTE — Telephone Encounter (Signed)
Pt called re medication. Says she is getting stomach upset and having trouble sleeping. Advised to take after eating. Also suggested maybe splitting into two doses, half in am and half in pm. Will call back if still having problems in next 24-28 hours.

## 2020-12-20 DIAGNOSIS — L97512 Non-pressure chronic ulcer of other part of right foot with fat layer exposed: Secondary | ICD-10-CM | POA: Diagnosis not present

## 2020-12-20 DIAGNOSIS — M205X1 Other deformities of toe(s) (acquired), right foot: Secondary | ICD-10-CM | POA: Diagnosis not present

## 2020-12-21 DIAGNOSIS — M25552 Pain in left hip: Secondary | ICD-10-CM | POA: Diagnosis not present

## 2020-12-21 DIAGNOSIS — M7062 Trochanteric bursitis, left hip: Secondary | ICD-10-CM | POA: Diagnosis not present

## 2020-12-25 DIAGNOSIS — M25552 Pain in left hip: Secondary | ICD-10-CM | POA: Diagnosis not present

## 2020-12-26 DIAGNOSIS — L97512 Non-pressure chronic ulcer of other part of right foot with fat layer exposed: Secondary | ICD-10-CM | POA: Diagnosis not present

## 2020-12-26 DIAGNOSIS — L219 Seborrheic dermatitis, unspecified: Secondary | ICD-10-CM | POA: Diagnosis not present

## 2020-12-26 DIAGNOSIS — Z79899 Other long term (current) drug therapy: Secondary | ICD-10-CM | POA: Diagnosis not present

## 2021-01-03 DIAGNOSIS — Z79891 Long term (current) use of opiate analgesic: Secondary | ICD-10-CM | POA: Diagnosis not present

## 2021-01-03 DIAGNOSIS — M47816 Spondylosis without myelopathy or radiculopathy, lumbar region: Secondary | ICD-10-CM | POA: Diagnosis not present

## 2021-01-03 DIAGNOSIS — G894 Chronic pain syndrome: Secondary | ICD-10-CM | POA: Diagnosis not present

## 2021-01-03 DIAGNOSIS — M25552 Pain in left hip: Secondary | ICD-10-CM | POA: Diagnosis not present

## 2021-01-03 DIAGNOSIS — M150 Primary generalized (osteo)arthritis: Secondary | ICD-10-CM | POA: Diagnosis not present

## 2021-01-03 DIAGNOSIS — M75121 Complete rotator cuff tear or rupture of right shoulder, not specified as traumatic: Secondary | ICD-10-CM | POA: Diagnosis not present

## 2021-01-09 DIAGNOSIS — M25552 Pain in left hip: Secondary | ICD-10-CM | POA: Diagnosis not present

## 2021-01-10 DIAGNOSIS — L97512 Non-pressure chronic ulcer of other part of right foot with fat layer exposed: Secondary | ICD-10-CM | POA: Diagnosis not present

## 2021-01-10 DIAGNOSIS — E11621 Type 2 diabetes mellitus with foot ulcer: Secondary | ICD-10-CM | POA: Diagnosis not present

## 2021-01-10 DIAGNOSIS — M205X1 Other deformities of toe(s) (acquired), right foot: Secondary | ICD-10-CM | POA: Diagnosis not present

## 2021-01-16 DIAGNOSIS — M25552 Pain in left hip: Secondary | ICD-10-CM | POA: Diagnosis not present

## 2021-01-22 DIAGNOSIS — L03115 Cellulitis of right lower limb: Secondary | ICD-10-CM | POA: Diagnosis not present

## 2021-01-22 DIAGNOSIS — L97512 Non-pressure chronic ulcer of other part of right foot with fat layer exposed: Secondary | ICD-10-CM | POA: Diagnosis not present

## 2021-01-22 DIAGNOSIS — J189 Pneumonia, unspecified organism: Secondary | ICD-10-CM | POA: Diagnosis not present

## 2021-01-22 DIAGNOSIS — M869 Osteomyelitis, unspecified: Secondary | ICD-10-CM | POA: Diagnosis not present

## 2021-01-23 DIAGNOSIS — M25552 Pain in left hip: Secondary | ICD-10-CM | POA: Diagnosis not present

## 2021-01-24 DIAGNOSIS — L03115 Cellulitis of right lower limb: Secondary | ICD-10-CM | POA: Diagnosis not present

## 2021-01-24 DIAGNOSIS — M869 Osteomyelitis, unspecified: Secondary | ICD-10-CM | POA: Diagnosis not present

## 2021-01-25 DIAGNOSIS — E669 Obesity, unspecified: Secondary | ICD-10-CM | POA: Diagnosis not present

## 2021-01-25 DIAGNOSIS — G4733 Obstructive sleep apnea (adult) (pediatric): Secondary | ICD-10-CM | POA: Diagnosis not present

## 2021-01-25 DIAGNOSIS — Z6838 Body mass index (BMI) 38.0-38.9, adult: Secondary | ICD-10-CM | POA: Diagnosis not present

## 2021-01-29 DIAGNOSIS — M205X1 Other deformities of toe(s) (acquired), right foot: Secondary | ICD-10-CM | POA: Diagnosis not present

## 2021-01-29 DIAGNOSIS — M869 Osteomyelitis, unspecified: Secondary | ICD-10-CM | POA: Diagnosis not present

## 2021-01-29 DIAGNOSIS — L97512 Non-pressure chronic ulcer of other part of right foot with fat layer exposed: Secondary | ICD-10-CM | POA: Diagnosis not present

## 2021-01-31 DIAGNOSIS — F39 Unspecified mood [affective] disorder: Secondary | ICD-10-CM | POA: Diagnosis not present

## 2021-01-31 DIAGNOSIS — N1831 Chronic kidney disease, stage 3a: Secondary | ICD-10-CM | POA: Diagnosis not present

## 2021-01-31 DIAGNOSIS — Z01818 Encounter for other preprocedural examination: Secondary | ICD-10-CM | POA: Diagnosis not present

## 2021-01-31 DIAGNOSIS — M25552 Pain in left hip: Secondary | ICD-10-CM | POA: Diagnosis not present

## 2021-01-31 DIAGNOSIS — I728 Aneurysm of other specified arteries: Secondary | ICD-10-CM | POA: Diagnosis not present

## 2021-02-01 DIAGNOSIS — Z01810 Encounter for preprocedural cardiovascular examination: Secondary | ICD-10-CM | POA: Diagnosis not present

## 2021-02-04 DIAGNOSIS — Z01818 Encounter for other preprocedural examination: Secondary | ICD-10-CM | POA: Diagnosis not present

## 2021-02-06 DIAGNOSIS — M25552 Pain in left hip: Secondary | ICD-10-CM | POA: Diagnosis not present

## 2021-02-15 DIAGNOSIS — M898X7 Other specified disorders of bone, ankle and foot: Secondary | ICD-10-CM | POA: Diagnosis not present

## 2021-02-15 DIAGNOSIS — L97519 Non-pressure chronic ulcer of other part of right foot with unspecified severity: Secondary | ICD-10-CM | POA: Diagnosis not present

## 2021-02-15 DIAGNOSIS — M205X1 Other deformities of toe(s) (acquired), right foot: Secondary | ICD-10-CM | POA: Diagnosis not present

## 2021-02-15 DIAGNOSIS — M7751 Other enthesopathy of right foot: Secondary | ICD-10-CM | POA: Diagnosis not present

## 2021-02-25 DIAGNOSIS — G894 Chronic pain syndrome: Secondary | ICD-10-CM | POA: Diagnosis not present

## 2021-02-25 DIAGNOSIS — M47816 Spondylosis without myelopathy or radiculopathy, lumbar region: Secondary | ICD-10-CM | POA: Diagnosis not present

## 2021-02-25 DIAGNOSIS — M150 Primary generalized (osteo)arthritis: Secondary | ICD-10-CM | POA: Diagnosis not present

## 2021-02-25 DIAGNOSIS — M75121 Complete rotator cuff tear or rupture of right shoulder, not specified as traumatic: Secondary | ICD-10-CM | POA: Diagnosis not present

## 2021-03-07 DIAGNOSIS — Z0000 Encounter for general adult medical examination without abnormal findings: Secondary | ICD-10-CM | POA: Diagnosis not present

## 2021-03-07 DIAGNOSIS — Z780 Asymptomatic menopausal state: Secondary | ICD-10-CM | POA: Diagnosis not present

## 2021-03-07 DIAGNOSIS — R7301 Impaired fasting glucose: Secondary | ICD-10-CM | POA: Diagnosis not present

## 2021-03-07 DIAGNOSIS — E039 Hypothyroidism, unspecified: Secondary | ICD-10-CM | POA: Diagnosis not present

## 2021-03-07 DIAGNOSIS — Z79899 Other long term (current) drug therapy: Secondary | ICD-10-CM | POA: Diagnosis not present

## 2021-03-07 DIAGNOSIS — E782 Mixed hyperlipidemia: Secondary | ICD-10-CM | POA: Diagnosis not present

## 2021-03-07 DIAGNOSIS — G894 Chronic pain syndrome: Secondary | ICD-10-CM | POA: Diagnosis not present

## 2021-03-07 DIAGNOSIS — K219 Gastro-esophageal reflux disease without esophagitis: Secondary | ICD-10-CM | POA: Diagnosis not present

## 2021-03-07 DIAGNOSIS — F32A Depression, unspecified: Secondary | ICD-10-CM | POA: Diagnosis not present

## 2021-03-07 DIAGNOSIS — J449 Chronic obstructive pulmonary disease, unspecified: Secondary | ICD-10-CM | POA: Diagnosis not present

## 2021-03-07 DIAGNOSIS — G4733 Obstructive sleep apnea (adult) (pediatric): Secondary | ICD-10-CM | POA: Diagnosis not present

## 2021-03-07 DIAGNOSIS — Z7951 Long term (current) use of inhaled steroids: Secondary | ICD-10-CM | POA: Diagnosis not present

## 2021-03-07 DIAGNOSIS — Z79891 Long term (current) use of opiate analgesic: Secondary | ICD-10-CM | POA: Diagnosis not present

## 2021-03-28 ENCOUNTER — Other Ambulatory Visit: Payer: Self-pay | Admitting: Internal Medicine

## 2021-03-28 DIAGNOSIS — R609 Edema, unspecified: Secondary | ICD-10-CM

## 2021-03-29 DIAGNOSIS — G4733 Obstructive sleep apnea (adult) (pediatric): Secondary | ICD-10-CM | POA: Diagnosis not present

## 2021-04-04 ENCOUNTER — Other Ambulatory Visit: Payer: Self-pay

## 2021-04-04 ENCOUNTER — Emergency Department
Admission: EM | Admit: 2021-04-04 | Discharge: 2021-04-04 | Disposition: A | Discharge status: Home / Self Care | Payer: PPO | Source: Home / Self Care

## 2021-04-04 ENCOUNTER — Emergency Department (INDEPENDENT_AMBULATORY_CARE_PROVIDER_SITE_OTHER): Payer: PPO

## 2021-04-04 DIAGNOSIS — R059 Cough, unspecified: Secondary | ICD-10-CM

## 2021-04-04 DIAGNOSIS — R509 Fever, unspecified: Secondary | ICD-10-CM

## 2021-04-04 DIAGNOSIS — R0602 Shortness of breath: Secondary | ICD-10-CM | POA: Diagnosis not present

## 2021-04-04 DIAGNOSIS — J189 Pneumonia, unspecified organism: Secondary | ICD-10-CM

## 2021-04-04 MED ORDER — LEVOFLOXACIN 750 MG PO TABS: 750.0000 mg | ORAL_TABLET | Freq: Every day | ORAL | 0 refills | Status: AC

## 2021-04-04 MED ORDER — BENZONATATE 200 MG PO CAPS: 200.0000 mg | ORAL_CAPSULE | Freq: Three times a day (TID) | ORAL | 0 refills | Status: AC | PRN

## 2021-04-04 MED ORDER — ACETAMINOPHEN 500 MG PO TABS
1000.0000 mg | ORAL_TABLET | Freq: Once | ORAL | Status: AC
  Administered 2021-04-04: 1000 mg via ORAL

## 2021-04-04 NOTE — ED Triage Notes (Signed)
Pt states that she has some sob, coughing, chest congestion and right side pain. X2 days  Pt states that she is vaccinated.

## 2021-04-04 NOTE — ED Provider Notes (Signed)
[name]    CSN: [id number] Arrival date & time: 04/04/21  1624      History   Chief Complaint Chief Complaint  Patient presents with   Shortness of Breath    Pt states that she has some sob, chest congestion, cough, right side pain with breathing.x2 days    HPI Taylor [name] is a 70 y.o. female.   HPI 70 year old female presents with shortness of breath, coughing, chest congestion and right-sided rib pain x2 days.  Patient reports is vaccinated for COVID-19.  PMH significant for COPD, chronic bronchitis, pleural effusion, pneumonia, tobacco use, and A. fib.  Past Medical History:  Diagnosis Date   Anxiety    Arthritis    "hands, neck, back" (07/22/2013)   Atrial fibrillation ([place])    CHEST DISCOMFORT    Chronic back pain    "started in lower back; getting to be all over my back" (07/22/2013)   Chronic bronchitis ([place])    "I've had it 2 years in a row; do have some trouble breathing at times; use an inhaler prn; not asthma" (07/22/2013)   Chronic neck pain    COPD (chronic obstructive pulmonary disease) (HCC)    DDD (degenerative disc disease), cervical    DDD (degenerative disc disease), lumbar    Depression    Dysrhythmia    GERD (gastroesophageal reflux disease)    Hepatitis 1971   "don't know if it was A or B" (07/22/2013)   HYPERLIPIDEMIA-MIXED 2005   "went away after I started taking fish oil" (07/22/2013)   Hypothyroidism    Palpitations    Pneumonia 2013   Type II diabetes mellitus ([place])    "diet controlled" (07/22/2013)    Patient Active Problem List   Diagnosis Date Noted   Secondary hypercoagulable state ([place]) 10/19/2019   Pleuritis 07/25/2013   CAP (community acquired pneumonia) 07/24/2013   COPD (chronic obstructive pulmonary disease) ([place]) 07/24/2013   Tobacco use 07/24/2013   Pneumonia 07/24/2013   Lung mass 07/24/2013   Pleural effusion 07/24/2013   UTI (urinary tract infection) 07/22/2013   Weakness 07/22/2013   HTN  (hypertension) 07/22/2013   Leukocytosis 07/22/2013   Hyponatremia 07/22/2013   Acute renal failure ([place]) 07/22/2013   Atrial fibrillation ([place]) 08/06/2008    Past Surgical History:  Procedure Laterality Date   ANTERIOR CERVICAL DECOMP/DISCECTOMY FUSION  ?2003   "w/plating" (07/22/2013)   ATRIAL FIBRILLATION ABLATION N/A 09/21/2019   Procedure: ATRIAL FIBRILLATION ABLATION;  Surgeon: Constance Haw, MD;  Location: [facility];  Service: Cardiovascular;  Laterality: N/A;   BUNIONECTOMY Left 2013   DILATION AND CURETTAGE OF UTERUS  1976   PLANTAR FASCIA SURGERY Right 2011    OB History   No obstetric history on file.      Home Medications    Prior to Admission medications   Medication Sig Start Date End Date Taking? Authorizing Provider  acetaminophen (TYLENOL) 500 MG tablet Take 1,000 mg by mouth every 6 (six) hours as needed for moderate pain or headache.    Yes [provider]  albuterol (PROVENTIL HFA;VENTOLIN HFA) 108 (90 BASE) MCG/ACT inhaler Inhale 1 puff into the lungs every 6 (six) hours as needed for wheezing or shortness of breath. 08/01/13  Yes Buriev, Arie Sabina, MD  ALPRAZolam Duanne Moron) 0.25 MG tablet Take 0.25 mg by mouth 2 (two) times daily as needed for anxiety. 11/29/11  Yes [provider]  Ascorbic Acid (VITAMIN C) 1000 MG tablet Take 1,000 mg by mouth  once a week.  11/15/16  Yes [provider]  benzonatate (TESSALON) 200 MG capsule Take 1 capsule (200 mg total) by mouth 3 (three) times daily as needed for up to 7 days for cough. 04/04/21 04/11/21 Yes Eliezer Lofts, FNP  buPROPion (WELLBUTRIN XL) 150 MG 24 hr tablet Take 300 mg by mouth daily.  11/19/11  Yes [provider]  cetirizine (ZYRTEC) 10 MG tablet Take 10 mg by mouth daily.    Yes [provider]  chlorhexidine (PERIDEX) 0.12 % solution Use as directed 15 mLs in the mouth or throat See admin instructions. Swish with 15 mls for 2 minutes then spit when  needed for mouth sores 02/26/18  Yes [provider]  Cholecalciferol (VITAMIN D3) 5000 UNITS CAPS Take 5,000 mg by mouth daily.   Yes [provider]  Cinnamon 500 MG TABS Take 1,000 mg by mouth daily.   Yes [provider]  ESTRADIOL TD Place 1 mL onto the skin daily. Estradiol 7.5mg /gm cream   Yes [provider]  Fluticasone-Salmeterol (ADVAIR) 500-50 MCG/DOSE AEPB Inhale 1 puff into the lungs in the morning and at bedtime.  06/30/18  Yes [provider]  furosemide (LASIX) 80 MG tablet Take 1 tablet (80 mg total) by mouth daily. 03/28/21  Yes Fay Records, MD  Guaifenesin 1200 MG TB12 Take 1,200 mg by mouth daily.   Yes [provider]  hydrOXYzine (ATARAX/VISTARIL) 50 MG tablet Take 50 mg by mouth daily as needed for itching.    Yes [provider]  ketoconazole (NIZORAL) 2 % cream Apply 1 application topically daily.  03/26/18  Yes [provider]  levofloxacin (LEVAQUIN) 750 MG tablet Take 1 tablet (750 mg total) by mouth daily for 7 days. 04/04/21 04/11/21 Yes Eliezer Lofts, FNP  metoprolol tartrate (LOPRESSOR) 25 MG tablet Take 1 tablet (25 mg total) by mouth 2 (two) times daily. 05/15/20  Yes Fay Records, MD  montelukast (SINGULAIR) 10 MG tablet Take 10 mg by mouth at bedtime.   Yes [provider]  OIL OF OREGANO PO Take 1-2 capsules by mouth daily as needed (cold symptoms).    Yes [provider]  Omega-3 Fatty Acids (FISH OIL) 1000 MG CAPS Take 2,000 mg by mouth daily.   Yes [provider]  omeprazole (PRILOSEC) 20 MG capsule Take 20 mg by mouth daily.    Yes [provider]  ondansetron (ZOFRAN) 8 MG tablet Take 8 mg by mouth every 8 (eight) hours as needed for nausea or vomiting.   Yes [provider]  oxyCODONE (ROXICODONE) 15 MG immediate release tablet Take 15 mg by mouth 4 (four) times daily.    Yes [provider]  potassium chloride (KLOR-CON M10) 10  MEQ tablet Take 1 tablet (10 mEq total) by mouth daily. 12/14/18  Yes Fay Records, MD  pregabalin (LYRICA) 75 MG capsule Take 75 mg by mouth 3 (three) times daily.    Yes [provider]  progesterone (PROMETRIUM) 100 MG capsule Take 300 mg by mouth every evening.  11/03/11  Yes [provider]  TESTOSTERONE TD Place 1-2 application onto the skin See admin instructions. Testosterone 2% cream. Apply 1 click's worth of testosterone cream on Sun, Tues, Thurs, and Sat. Apply 2 click's worth on Mon, Wed, and Fri   Yes [provider]  thyroid (ARMOUR) 60 MG tablet Take 30-120 mg by mouth See admin instructions. Take 120 mg in the morning and 30 mg in  the afternoon   Yes [provider]  tiZANidine (ZANAFLEX) 4 MG tablet Take 2 mg by mouth 4 (four) times daily as needed for muscle spasms.    Yes [provider]  TURMERIC PO Take 1,440 mg by mouth every evening.   Yes [provider]  Vitamin A 2400 MCG (8000 UT) CAPS Take 2,400 mcg by mouth daily.   Yes [provider]  Vitamin D-Vitamin K (VITAMIN K2-VITAMIN D3 PO) Take 1 capsule by mouth every evening.   Yes [provider]  escitalopram (LEXAPRO) 10 MG tablet Take 10 mg by mouth daily.  07/30/18 12/26/19  [provider]    Family History Family History  Problem Relation Age of Onset   Heart disease Other    Diabetes Maternal Grandfather    Heart attack Paternal Grandfather     Social History Social History   Tobacco Use   Smoking status: Former    Packs/day: 1.00    Years: 40.00    Pack years: 40.00    Types: Cigarettes    Quit date: 12/22/2007    Years since quitting: 13.2   Smokeless tobacco: Never  Substance Use Topics   Alcohol use: Not Currently    Comment: 07/22/2013 "last drink was in ~ 2005"   Drug use: No     Allergies   Avelox [moxifloxacin hcl in nacl], Linezolid, Venlafaxine, Rosuvastatin, Clindamycin, Statins, Cephalexin, Nitrofurantoin,  Other, and Penicillins   Review of Systems Review of Systems   Physical Exam Triage Vital Signs ED Triage Vitals  Enc Vitals Group     BP 04/04/21 1724 128/68     Pulse Rate 04/04/21 1724 74     Resp --      Temp 04/04/21 1724 99.9 F (37.7 C)     Temp Source 04/04/21 1724 Oral     SpO2 04/04/21 1724 94 %     Weight 04/04/21 1721 246 lb (111.6 kg)     Height 04/04/21 1721 5\' 6"  (1.676 m)     Head Circumference --      Peak Flow --      Pain Score 04/04/21 1721 4     Pain Loc --      Pain Edu? --      Excl. in [employer]? --    No data found.  Updated Vital Signs BP 128/68 (BP Location: Left Arm)   Pulse 74   Temp 99.9 F (37.7 C) (Oral)   Ht 5\' 6"  (1.676 m)   Wt 246 lb (111.6 kg)   SpO2 94%   BMI 39.71 kg/m    Physical Exam Vitals and nursing note reviewed.  Constitutional:      General: She is not in acute distress.    Appearance: She is obese. She is ill-appearing.  HENT:     Head: Normocephalic and atraumatic.     Mouth/Throat:     Mouth: Mucous membranes are moist.     Pharynx: Oropharynx is clear.  Eyes:     Extraocular Movements: Extraocular movements intact.     Pupils: Pupils are equal, round, and reactive to light.  Cardiovascular:     Rate and Rhythm: Normal rate and regular rhythm.     Heart sounds: No murmur heard. Pulmonary:     Effort: Pulmonary effort is normal.     Breath sounds: Examination of the right-upper field reveals decreased breath sounds. Examination of the left-upper field reveals decreased breath sounds. Examination of the right-middle field reveals decreased breath sounds.  Examination of the right-lower field reveals decreased breath sounds. Examination of the left-lower field reveals decreased breath sounds. Decreased breath sounds present.  Musculoskeletal:        General: Normal range of motion.     Cervical back: Normal range of motion and neck supple.  Skin:    General: Skin is warm and dry.  Neurological:     General: No  focal deficit present.     Mental Status: She is alert and oriented to person, place, and time.     UC Treatments / Results  Labs (all labs ordered are listed, but only abnormal results are displayed) Labs Reviewed  COVID-19, FLU A+B NAA    EKG   Radiology DG Chest 2 View  Result Date: 04/04/2021 CLINICAL DATA:  Cough EXAM: CHEST - 2 VIEW COMPARISON:  12/16/2020 FINDINGS: Heart is normal size. Diffuse interstitial prominence. Airspace opacities in the lower lobes, right greater than left. No effusions or acute bony abnormality. IMPRESSION: Stable chronic interstitial prominence of the lungs, favor chronic lung disease. Focal airspace opacities in both lower lobes, right greater than left concerning for pneumonia. Electronically Signed   By: Rolm Baptise M.D.   On: 04/04/2021 18:12    Procedures Procedures (including critical care time)  Medications Ordered in UC Medications  acetaminophen (TYLENOL) tablet 1,000 mg (1,000 mg Oral Given 04/04/21 1740)    Initial Impression / Assessment and Plan / UC Course  I have reviewed the triage vital signs and the nursing notes.  Pertinent labs & imaging results that were available during my care of the patient were reviewed by me and considered in my medical decision making (see chart for details).     MDM: 1.  Shortness of breath-CXR revealed (above); 2. Fever-Tylenol 1000 mg given once in clinic, COVID-19 flu A/B ordered; 3.  Pneumonia of right lower lobe due to infectious organism-Rx'd Levaquin and Tessalon Perles. Advised patient to take medication as directed with food to completion.  Discharged home, hemodynamically stable. Final Clinical Impressions(s) / UC Diagnoses   Final diagnoses:  SOB (shortness of breath)  Fever, unspecified  Pneumonia of right lower lobe due to infectious organism     Discharge Instructions      Advised patient to take medication as directed with food to completion.     ED Prescriptions      Medication Sig Dispense Auth. Provider   levofloxacin (LEVAQUIN) 750 MG tablet Take 1 tablet (750 mg total) by mouth daily for 7 days. 7 tablet Eliezer Lofts, FNP   benzonatate (TESSALON) 200 MG capsule Take 1 capsule (200 mg total) by mouth 3 (three) times daily as needed for up to 7 days for cough. 40 capsule Eliezer Lofts, FNP      PDMP not reviewed this encounter.   Eliezer Lofts, [place] 04/04/21 [phone]

## 2021-04-04 NOTE — Discharge Instructions (Addendum)
Advised patient to take medication as directed with food to completion.

## 2021-04-06 LAB — COVID-19, FLU A+B NAA
Influenza A, NAA: NOT DETECTED
Influenza B, NAA: NOT DETECTED
SARS-CoV-2, NAA: NOT DETECTED

## 2021-04-15 DIAGNOSIS — M150 Primary generalized (osteo)arthritis: Secondary | ICD-10-CM | POA: Diagnosis not present

## 2021-04-15 DIAGNOSIS — M75121 Complete rotator cuff tear or rupture of right shoulder, not specified as traumatic: Secondary | ICD-10-CM | POA: Diagnosis not present

## 2021-04-15 DIAGNOSIS — M47816 Spondylosis without myelopathy or radiculopathy, lumbar region: Secondary | ICD-10-CM | POA: Diagnosis not present

## 2021-04-15 DIAGNOSIS — G894 Chronic pain syndrome: Secondary | ICD-10-CM | POA: Diagnosis not present

## 2021-04-17 DIAGNOSIS — Z720 Tobacco use: Secondary | ICD-10-CM | POA: Diagnosis not present

## 2021-05-07 DIAGNOSIS — M25531 Pain in right wrist: Secondary | ICD-10-CM | POA: Diagnosis not present

## 2021-05-08 ENCOUNTER — Other Ambulatory Visit: Payer: Self-pay | Admitting: Nurse Practitioner

## 2021-05-08 DIAGNOSIS — Z780 Asymptomatic menopausal state: Secondary | ICD-10-CM

## 2021-05-08 DIAGNOSIS — Z1231 Encounter for screening mammogram for malignant neoplasm of breast: Secondary | ICD-10-CM

## 2021-05-10 ENCOUNTER — Other Ambulatory Visit: Payer: Self-pay | Admitting: Internal Medicine

## 2021-05-15 DIAGNOSIS — M25531 Pain in right wrist: Secondary | ICD-10-CM | POA: Diagnosis not present

## 2021-05-15 DIAGNOSIS — M1811 Unilateral primary osteoarthritis of first carpometacarpal joint, right hand: Secondary | ICD-10-CM | POA: Diagnosis not present

## 2021-05-27 ENCOUNTER — Other Ambulatory Visit: Payer: Self-pay | Admitting: Pulmonary Disease

## 2021-05-27 DIAGNOSIS — Z87891 Personal history of nicotine dependence: Secondary | ICD-10-CM

## 2021-06-12 ENCOUNTER — Other Ambulatory Visit: Payer: Self-pay

## 2021-06-12 ENCOUNTER — Ambulatory Visit: Payer: PPO | Admitting: Internal Medicine

## 2021-06-12 ENCOUNTER — Encounter: Payer: Self-pay | Admitting: Internal Medicine

## 2021-06-12 VITALS — BP 144/62 | HR 74 | Ht 66.0 in | Wt 247.0 lb

## 2021-06-12 DIAGNOSIS — I480 Paroxysmal atrial fibrillation: Secondary | ICD-10-CM

## 2021-06-12 NOTE — Patient Instructions (Signed)
Medication Instructions:  Your physician recommends that you continue on your current medications as directed. Please refer to the Current Medication list given to you today.  *If you need a refill on your cardiac medications before your next appointment, please call your pharmacy*   Lab Work: none If you have labs (blood work) drawn today and your tests are completely normal, you will receive your results only by: [place] (if you have MyChart) OR A paper copy in the mail If you have any lab test that is abnormal or we need to change your treatment, we will call you to review the results.   Testing/Procedures: none   Follow-Up: At [facility], you and your health needs are our priority.  As part of our continuing mission to provide you with exceptional heart care, we have created designated Provider Care Teams.  These Care Teams include your primary Cardiologist (physician) and Advanced Practice Providers (APPs -  Physician Assistants and Nurse Practitioners) who all work together to provide you with the care you need, when you need it.  We recommend signing up for the patient portal called "MyChart".  Sign up information is provided on this After Visit Summary.  MyChart is used to connect with patients for Virtual Visits (Telemedicine).  Patients are able to view lab/test results, encounter notes, upcoming appointments, etc.  Non-urgent messages can be sent to your provider as well.   To learn more about what you can do with MyChart, go to [email or web address].    Your next appointment:   10 month(s)  The format for your next appointment:   In Person  Provider:   Dorris Carnes, MD     Other Instructions

## 2021-06-12 NOTE — Progress Notes (Signed)
Cardiology Office Note   Date:  06/12/2021   ID:  Taylor [name], DOB May 30, 1951, MRN [id number]  PCP:  Larene Beach, MD  Cardiologist:   Dorris Carnes, MD   F/U of PAF    History of Present Illness: Taylor [name] is a 71 y.o. female with a history of OSA, COPD, DM hypothyroidism, GERD and PA Patient had afib initially in 2009, around time of URI.  Had intermitt after. Placed on flecanide 100 bid   I saw her in clinic in 2020    In 2021 she had recurrent palpitations / afib and was seen by Elliot Cousin.  She underwent ablation of afib in early 2021    After this her flecanide was stopped   With no recurrence of afib, Xarelto was also disconfinued   Since seen by Elliot Cousin she denies palpitations  No CP    She had pneumonia a couple of times  (July and Aug)   She says her breathing is not back to normal     Diet Br:   Cereal Lunch Healthy choice, yogurt Dinner  butter, sauces,   Water and coffee    Snacks   Cookies and cancding   Current Meds  Medication Sig   acetaminophen (TYLENOL) 500 MG tablet Take 1,000 mg by mouth every 6 (six) hours as needed for moderate pain or headache.    albuterol (PROVENTIL HFA;VENTOLIN HFA) 108 (90 BASE) MCG/ACT inhaler Inhale 1 puff into the lungs every 6 (six) hours as needed for wheezing or shortness of breath.   Ascorbic Acid (VITAMIN C) 1000 MG tablet Take 1,000 mg by mouth once a week.    buPROPion (WELLBUTRIN XL) 150 MG 24 hr tablet Take 300 mg by mouth daily.    cetirizine (ZYRTEC) 10 MG tablet Take 10 mg by mouth daily.    chlorhexidine (PERIDEX) 0.12 % solution Use as directed 15 mLs in the mouth or throat See admin instructions. Swish with 15 mls for 2 minutes then spit when needed for mouth sores   Cholecalciferol (VITAMIN D3) 5000 UNITS CAPS Take 5,000 mg by mouth daily.   Cinnamon 500 MG TABS Take 1,000 mg by mouth daily.   ESTRADIOL TD Place 1 mL onto the skin daily. Estradiol 7.5mg /gm cream   FLUoxetine (PROZAC) 20 MG tablet Take 20  mg by mouth in the morning and at bedtime.   Fluticasone-Salmeterol (ADVAIR) 500-50 MCG/DOSE AEPB Inhale 1 puff into the lungs in the morning and at bedtime.    furosemide (LASIX) 80 MG tablet Take 1 tablet (80 mg total) by mouth daily.   Guaifenesin 1200 MG TB12 Take 1,200 mg by mouth daily.   hydrOXYzine (ATARAX/VISTARIL) 50 MG tablet Take 50 mg by mouth daily as needed for itching.    ketoconazole (NIZORAL) 2 % cream Apply 1 application topically daily.    metoprolol tartrate (LOPRESSOR) 25 MG tablet Take 1 tablet (25 mg total) by mouth 2 (two) times daily. Please keep upcoming appt in January 2023 with Dr. Harrington Challenger before anymore refills. Thank you Final Attempt   montelukast (SINGULAIR) 10 MG tablet Take 10 mg by mouth at bedtime.   OIL OF OREGANO PO Take 1-2 capsules by mouth daily as needed (cold symptoms).    Omega-3 Fatty Acids (FISH OIL) 1000 MG CAPS Take 2,000 mg by mouth daily.   omeprazole (PRILOSEC) 20 MG capsule Take 20 mg by mouth daily.    potassium chloride (KLOR-CON M10) 10 MEQ tablet Take 1 tablet (10 mEq  total) by mouth daily.   pregabalin (LYRICA) 75 MG capsule Take 75 mg by mouth 3 (three) times daily.    progesterone (PROMETRIUM) 100 MG capsule Take 300 mg by mouth every evening.    TESTOSTERONE TD Place 1-2 application onto the skin See admin instructions. Testosterone 2% cream. Apply 1 click's worth of testosterone cream on Sun, Tues, Thurs, and Sat. Apply 2 click's worth on Mon, Wed, and Fri   thyroid (ARMOUR) 60 MG tablet Take 30-120 mg by mouth See admin instructions. Take 120 mg in the morning and 30 mg in the afternoon   tiZANidine (ZANAFLEX) 4 MG tablet Take 2 mg by mouth 4 (four) times daily as needed for muscle spasms.    TURMERIC PO Take 1,440 mg by mouth every evening.   Vitamin A 2400 MCG (8000 UT) CAPS Take 2,400 mcg by mouth daily.   Vitamin D-Vitamin K (VITAMIN K2-VITAMIN D3 PO) Take 1 capsule by mouth every evening.     Allergies:   Avelox [moxifloxacin hcl  in nacl], Linezolid, Venlafaxine, Rosuvastatin, Clindamycin, Statins, Cephalexin, Nitrofurantoin, Other, and Penicillins   Past Medical History:  Diagnosis Date   Anxiety    Arthritis    "hands, neck, back" (07/22/2013)   Atrial fibrillation ([place])    CHEST DISCOMFORT    Chronic back pain    "started in lower back; getting to be all over my back" (07/22/2013)   Chronic bronchitis ([place])    "I've had it 2 years in a row; do have some trouble breathing at times; use an inhaler prn; not asthma" (07/22/2013)   Chronic neck pain    COPD (chronic obstructive pulmonary disease) (HCC)    DDD (degenerative disc disease), cervical    DDD (degenerative disc disease), lumbar    Depression    Dysrhythmia    GERD (gastroesophageal reflux disease)    Hepatitis 1971   "don't know if it was A or B" (07/22/2013)   HYPERLIPIDEMIA-MIXED 2005   "went away after I started taking fish oil" (07/22/2013)   Hypothyroidism    Palpitations    Pneumonia 2013   Type II diabetes mellitus ([place])    "diet controlled" (07/22/2013)    Past Surgical History:  Procedure Laterality Date   ANTERIOR CERVICAL DECOMP/DISCECTOMY FUSION  ?2003   "w/plating" (07/22/2013)   ATRIAL FIBRILLATION ABLATION N/A 09/21/2019   Procedure: ATRIAL FIBRILLATION ABLATION;  Surgeon: Constance Haw, MD;  Location: [facility];  Service: Cardiovascular;  Laterality: N/A;   BUNIONECTOMY Left 2013   DILATION AND CURETTAGE OF UTERUS  1976   PLANTAR FASCIA SURGERY Right 2011     Social History:  The patient  reports that she quit smoking about 13 years ago. Her smoking use included cigarettes. She has a 40.00 pack-year smoking history. She has never used smokeless tobacco. She reports that she does not currently use alcohol. She reports that she does not use drugs.   Family History:  The patient's family history includes Diabetes in her maternal grandfather; Heart attack in her paternal grandfather; Heart disease in an other family  member.    ROS:  Please see the history of present illness. All other systems are reviewed and  Negative to the above problem except as noted.    PHYSICAL EXAM: VS:  BP (!) 144/62    Pulse 74    Ht 5\' 6"  (1.676 m)    Wt 247 lb (112 kg)    SpO2 95%    BMI 39.87 kg/m  Gen  Morbidly obese 71 yo in no acute distress  HEENT: normal  Neck: JVP normal , carotid bruits Cardiac: RRR  No murmurs  No LE edema  Respiratory:  clear to auscultation biilaterally   GI: soft, nontender, nondistended, + BS  No hepatomegaly  MS: no deformity Moving all extremities   Skin: warm and dry  Rash around mouth  SOme erythema at arms   Neuro:  Grossly intact  Psych: euthymic mood, full affect   EKG:  EKG isnot ordered today  Lipid Panel    Component Value Date/Time   CHOL 210 (H) 04/25/2019 1543   TRIG 159 (H) 04/25/2019 1543   HDL 54 04/25/2019 1543   CHOLHDL 3.9 04/25/2019 1543   CHOLHDL 4.2 CALC 11/17/2007 0844   VLDL 18 11/17/2007 0844   LDLCALC 128 (H) 04/25/2019 1543      Wt Readings from Last 3 Encounters:  06/12/21 247 lb (112 kg)  04/04/21 246 lb (111.6 kg)  12/16/20 256 lb (116.1 kg)      ASSESSMENT AND PLAN:  1  PAF Pt is s/p ablation   no clinical recurrence   Off of meds     Follow     2  PAD  Pt with mild plaquing on CT   Control risk factors   3  HL   Follow lipds  Lasat panel 2 years ago   Need to   4   HTN  BP is a little high today   Pt will monitor  She will call back if over 140/ on average  F/U 1 year    Current medicines are reviewed at length with the patient today.  The patient does not have concerns regarding medicines.  Signed, Dorris Carnes, MD  06/12/2021 4:48 PM    [address], [place], [place]  [address] Phone: [phone]; Fax: [phone]

## 2021-06-13 ENCOUNTER — Telehealth: Payer: Self-pay

## 2021-06-13 NOTE — Telephone Encounter (Signed)
-----   Message from Fay Records, MD sent at 06/12/2021  9:12 PM EST ----- Sorry,   need lipids   Last were in 2020

## 2021-06-13 NOTE — Telephone Encounter (Signed)
Attempted to call the pt but unable to leave a message due to no voicemail. Will try again later.

## 2021-06-19 ENCOUNTER — Ambulatory Visit: Payer: PPO

## 2021-06-19 ENCOUNTER — Other Ambulatory Visit: Payer: PPO

## 2021-06-19 ENCOUNTER — Ambulatory Visit: Payer: Self-pay

## 2021-06-21 NOTE — Telephone Encounter (Signed)
Spoke with the pt and she is having lipids drawn next week with her PCP... Julie Martinique NP.

## 2021-06-26 ENCOUNTER — Other Ambulatory Visit: Payer: PPO

## 2021-06-26 ENCOUNTER — Other Ambulatory Visit: Payer: Self-pay

## 2021-06-26 ENCOUNTER — Ambulatory Visit (INDEPENDENT_AMBULATORY_CARE_PROVIDER_SITE_OTHER): Payer: PPO

## 2021-06-26 DIAGNOSIS — Z1231 Encounter for screening mammogram for malignant neoplasm of breast: Secondary | ICD-10-CM

## 2021-06-26 DIAGNOSIS — Z87891 Personal history of nicotine dependence: Secondary | ICD-10-CM

## 2021-06-26 DIAGNOSIS — Z122 Encounter for screening for malignant neoplasm of respiratory organs: Secondary | ICD-10-CM

## 2021-07-02 NOTE — Telephone Encounter (Signed)
Requested labs from Julie Martinique NP via fax request.

## 2021-07-03 ENCOUNTER — Other Ambulatory Visit: Payer: Self-pay | Admitting: Internal Medicine

## 2021-07-03 ENCOUNTER — Ambulatory Visit (INDEPENDENT_AMBULATORY_CARE_PROVIDER_SITE_OTHER): Payer: PPO

## 2021-07-03 DIAGNOSIS — R609 Edema, unspecified: Secondary | ICD-10-CM

## 2021-07-03 DIAGNOSIS — Z780 Asymptomatic menopausal state: Secondary | ICD-10-CM | POA: Diagnosis not present

## 2021-08-07 NOTE — Telephone Encounter (Signed)
Labs/Lipids re-requested from Julie Martinique NP.  ?

## 2021-08-19 ENCOUNTER — Other Ambulatory Visit: Payer: Self-pay | Admitting: Internal Medicine

## 2021-10-21 IMAGING — CT CT HEART MORPH/PULM VEIN W/ CM & W/O CA SCORE
1 series · 12 of 18 positions shown, 15 images · non-contrast
Comparison: CT chest 07/24/2013

Addendum:
CLINICAL DATA: Atrial fibrillation scheduled for an ablation.

EXAM:
Cardiac CT/CTA
TECHNIQUE: A 120 kV prospective scan was triggered in the ascending thoracic
aorta at 140 HU's. Gantry rotation speed was 250 msecs and
collimation was .6 mm. No beta blockade and no NTG was given. The 3D
data set was reconstructed for best systolic and diastolic phases
along with delayed images of the ALEXAIDA Images analyzed on a dedicated
work station using MPR, MIP and VRT modes. The patient received 80
cc of contrast.

[Series 526: findings · 0.38mm/px · 12 of 18 slices shown, 15 images]
[im 2/18  vessel]
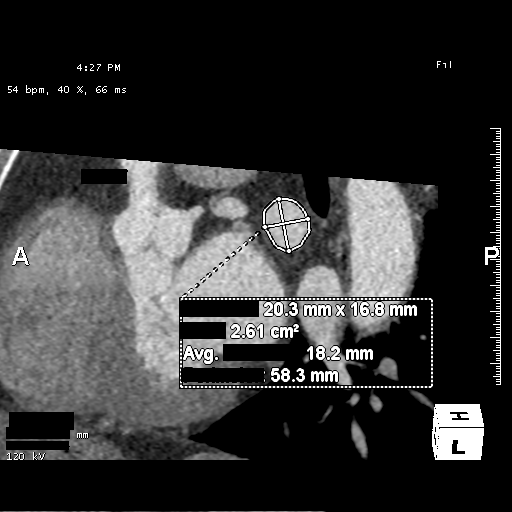
[im 2/18  lung]
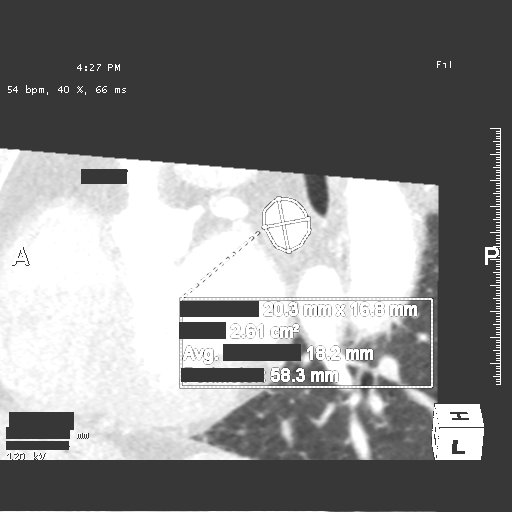
[im 4/18  vessel]
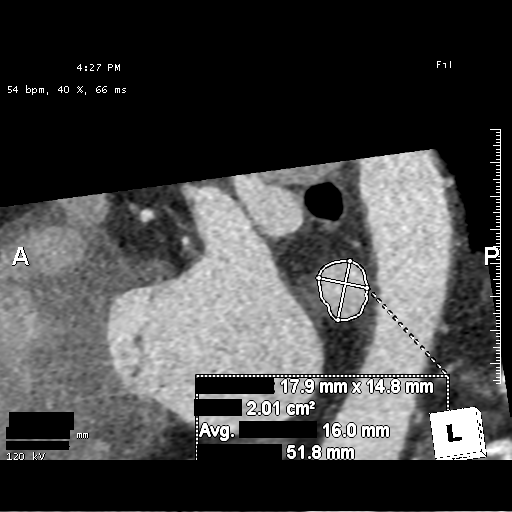
[im 5/18  vessel]
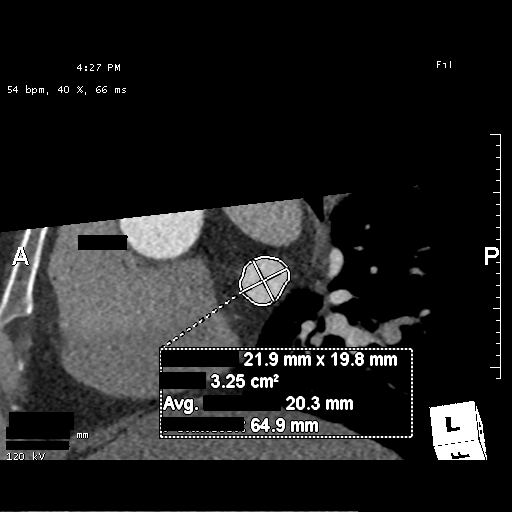
[im 6/18  vessel]
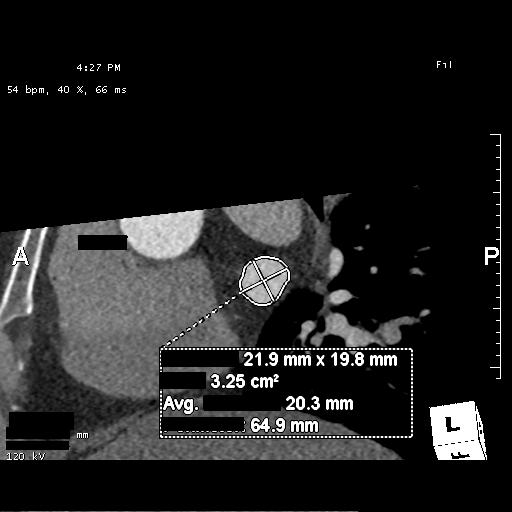
[im 8/18  vessel]
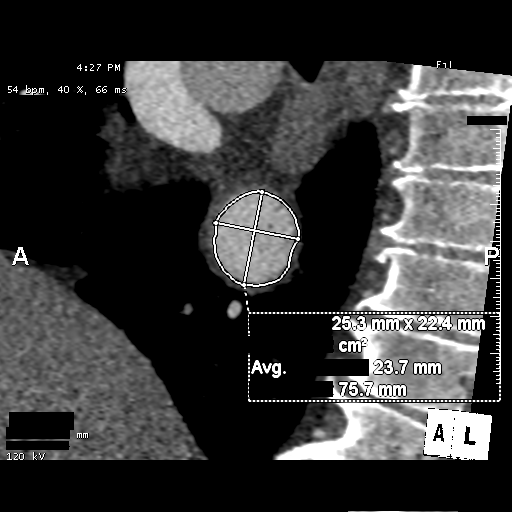
[im 8/18  lung]
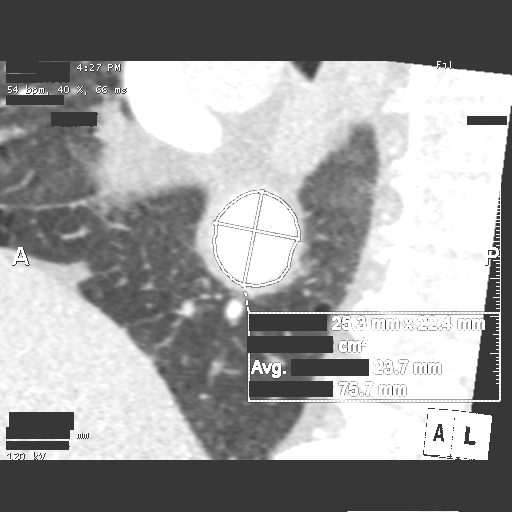
[im 9/18  vessel]
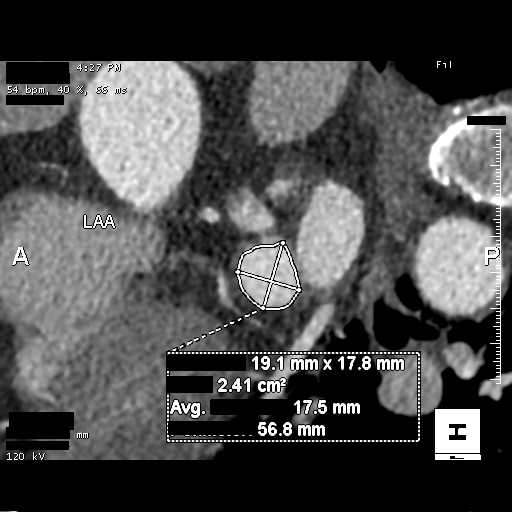
[im 10/18  vessel]
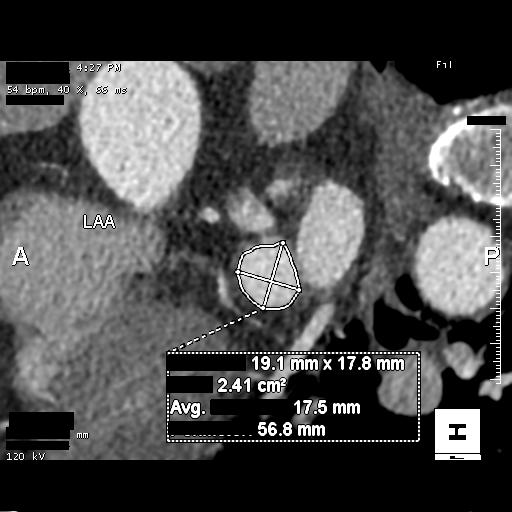
[im 12/18  vessel]
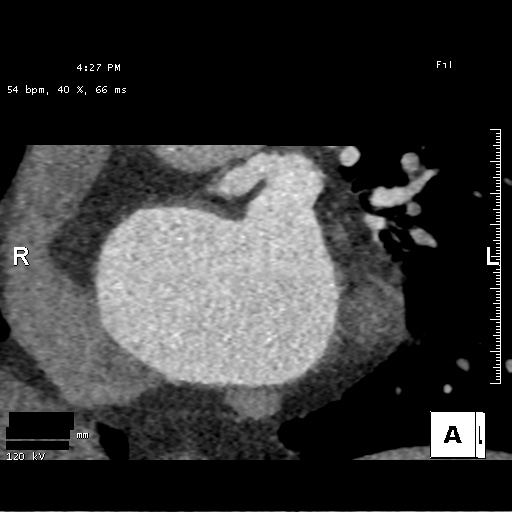
[im 13/18  vessel]
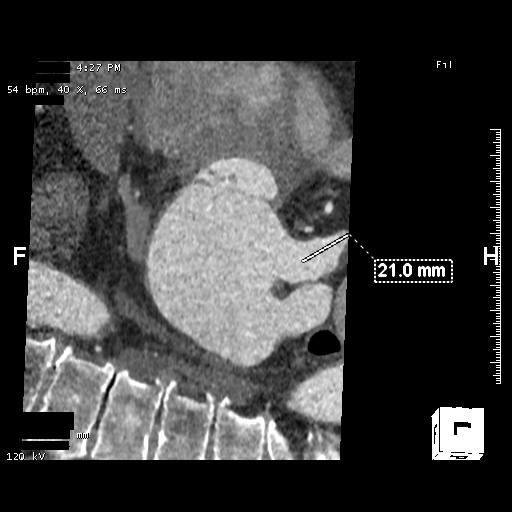
[im 13/18  lung]
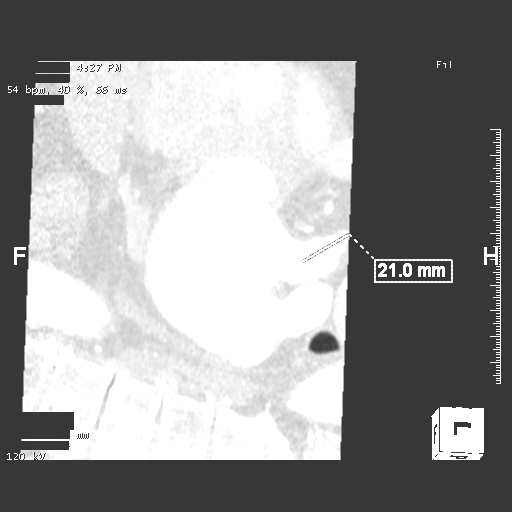
[im 14/18  vessel]
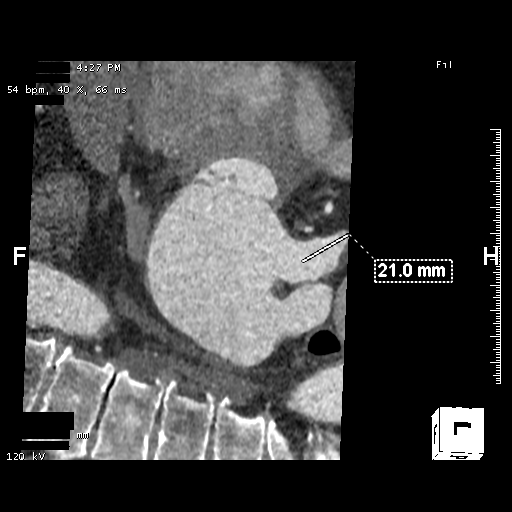
[im 16/18  vessel]
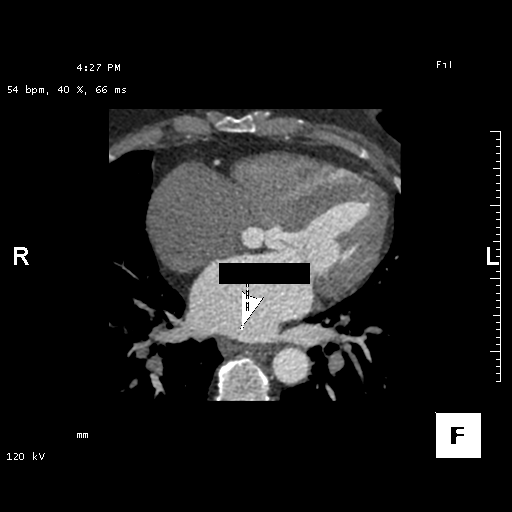
[im 17/18  vessel]
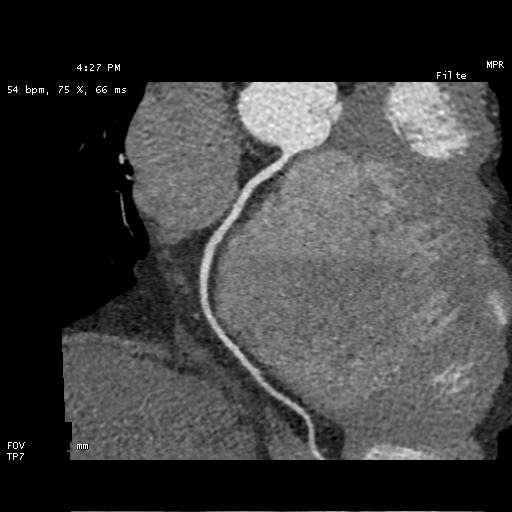

[12 of 18 positions shown; findings below may reference images not displayed]

FINDINGS: Image quality: excellent.

Noise artifact is: Mild signal-to-noise artifact is present (BMI
41).

Pulmonary Veins: There is normal pulmonary vein drainage into the
left atrium (2 on the right and 2 on the left) with ostial
measurements as follows:

RUPV: Ostium 21.9 mm x 19.8 mm  area 3.25 cm2

RLPV:  Ostium 25.3 mm x 22.4 mm  area 4.42 cm2

LUPV:  Ostium 20.3 mm x 16.8 mm  area 2.61 cm2

LLPV:  Ostium 18 mm x 14.8 mm  area 2.01 cm2

Left Atrium: The left atrial size is normal. There is no PFO/ASD.
The left atrial appendage is large mixed chicken wing / broccoli
with 2 lobes with ostial size 19 mm x 18 mm and length 21 mm. There
is no thrombus in the left atrial appendage. The esophagus runs in
the left atrial midline and is not in the proximity to any of the
pulmonary veins.

Coronary Arteries: CAC score of 1 which is 50th percentile for age
and sex matched controls. Normal coronary origin. Right dominance.
The study was performed without use of NTG and is insufficient for
plaque evaluation. The following assessment was made of the proximal
segments:

Left main: The left main is a large caliber vessel with a normal
take off from the left coronary cusp that bifurcates to form a left
anterior descending artery and a left circumflex artery. There is no
plaque or stenosis.

Left anterior descending artery: The LAD is patent without evidence
of plaque or stenosis in the proximal segment.

Left circumflex artery: The LCX is non-dominant with no evidence of
plaque or stenosis in the proximal segment.

Right coronary artery: The RCA is dominant with normal take off from
the right coronary cusp with no evidence of plaque or stenosis in
the proximal segment.

Right Atrium: Right atrial size is within normal limits.

Right Ventricle: The right ventricular cavity is within normal
limits.

Left Ventricle: The ventricular cavity size is within normal limits.
There are no stigmata of prior infarction. There is no abnormal
filling defect.

Pericardium: Normal thickness with no significant effusion or
calcium present.

Cardiac valves: The aortic valve is trileaflet without significant
calcification. The mitral valve is normal structure without
significant calcification.

Aorta: Normal caliber with no significant disease.

Extra-cardiac findings: See attached radiology report for
non-cardiac structures.
IMPRESSION: 1. There is normal pulmonary vein drainage into the left atrium with
ostial measurements above.

2. There is no thrombus in the left atrial appendage.

3. The esophagus runs in the left atrial midline and is not in the
proximity to any of the pulmonary veins.

4. No PFO/ASD.

5. Normal coronary origin. Right dominance.

6. CAC score of 1 which is 50th percentile for age- and sex-matched
controls. No CAD in the proximal segments.

EXAM:
OVER-READ INTERPRETATION  CT CHEST

The following report is an over-read performed by radiologist Dr.
over-read does not include interpretation of cardiac or coronary
anatomy or pathology. The coronary CTA interpretation by the
cardiologist is attached.
FINDINGS: Limited view of the lung parenchyma demonstrates subpleural nodule
in the RIGHT middle lobe measuring 6 mm (image [DATE]) which is not
changed in size from comparison CT. Airways are normal.

Limited view of the mediastinum demonstrates no adenopathy.
Esophagus normal.

Limited view of the upper abdomen unremarkable.

Limited view of the skeleton and chest wall is unremarkable.
IMPRESSION: Stable RIGHT middle lobe pulmonary nodule over 6 year interval
consistent with benign etiology.

*** End of Addendum ***
FINDINGS: Image quality: excellent.

Noise artifact is: Mild signal-to-noise artifact is present (BMI
41).

Pulmonary Veins: There is normal pulmonary vein drainage into the
left atrium (2 on the right and 2 on the left) with ostial
measurements as follows:

RUPV: Ostium 21.9 mm x 19.8 mm  area 3.25 cm2

RLPV:  Ostium 25.3 mm x 22.4 mm  area 4.42 cm2

LUPV:  Ostium 20.3 mm x 16.8 mm  area 2.61 cm2

LLPV:  Ostium 18 mm x 14.8 mm  area 2.01 cm2

Left Atrium: The left atrial size is normal. There is no PFO/ASD.
The left atrial appendage is large mixed chicken wing / broccoli
with 2 lobes with ostial size 19 mm x 18 mm and length 21 mm. There
is no thrombus in the left atrial appendage. The esophagus runs in
the left atrial midline and is not in the proximity to any of the
pulmonary veins.

Coronary Arteries: CAC score of 1 which is 50th percentile for age
and sex matched controls. Normal coronary origin. Right dominance.
The study was performed without use of NTG and is insufficient for
plaque evaluation. The following assessment was made of the proximal
segments:

Left main: The left main is a large caliber vessel with a normal
take off from the left coronary cusp that bifurcates to form a left
anterior descending artery and a left circumflex artery. There is no
plaque or stenosis.

Left anterior descending artery: The LAD is patent without evidence
of plaque or stenosis in the proximal segment.

Left circumflex artery: The LCX is non-dominant with no evidence of
plaque or stenosis in the proximal segment.

Right coronary artery: The RCA is dominant with normal take off from
the right coronary cusp with no evidence of plaque or stenosis in
the proximal segment.

Right Atrium: Right atrial size is within normal limits.

Right Ventricle: The right ventricular cavity is within normal
limits.

Left Ventricle: The ventricular cavity size is within normal limits.
There are no stigmata of prior infarction. There is no abnormal
filling defect.

Pericardium: Normal thickness with no significant effusion or
calcium present.

Cardiac valves: The aortic valve is trileaflet without significant
calcification. The mitral valve is normal structure without
significant calcification.

Aorta: Normal caliber with no significant disease.

Extra-cardiac findings: See attached radiology report for
non-cardiac structures.
IMPRESSION: 1. There is normal pulmonary vein drainage into the left atrium with
ostial measurements above.

2. There is no thrombus in the left atrial appendage.

3. The esophagus runs in the left atrial midline and is not in the
proximity to any of the pulmonary veins.

4. No PFO/ASD.

5. Normal coronary origin. Right dominance.

6. CAC score of 1 which is 50th percentile for age- and sex-matched
controls. No CAD in the proximal segments.

## 2021-11-20 ENCOUNTER — Other Ambulatory Visit: Payer: Self-pay | Admitting: Internal Medicine

## 2022-01-21 ENCOUNTER — Ambulatory Visit (INDEPENDENT_AMBULATORY_CARE_PROVIDER_SITE_OTHER): Payer: PPO

## 2022-01-21 ENCOUNTER — Ambulatory Visit
Admission: RE | Admit: 2022-01-21 | Discharge: 2022-01-21 | Disposition: A | Discharge status: Home / Self Care | Payer: PPO | Source: Ambulatory Visit | Attending: Family Medicine | Admitting: Family Medicine

## 2022-01-21 VITALS — BP 106/64 | HR 75 | Temp 99.0°F | Resp 18

## 2022-01-21 DIAGNOSIS — R059 Cough, unspecified: Secondary | ICD-10-CM

## 2022-01-21 DIAGNOSIS — J020 Streptococcal pharyngitis: Secondary | ICD-10-CM | POA: Diagnosis not present

## 2022-01-21 DIAGNOSIS — R509 Fever, unspecified: Secondary | ICD-10-CM

## 2022-01-21 DIAGNOSIS — J189 Pneumonia, unspecified organism: Secondary | ICD-10-CM

## 2022-01-21 LAB — POCT RAPID STREP A (OFFICE): Rapid Strep A Screen: POSITIVE — AB

## 2022-01-21 LAB — SARS CORONAVIRUS 2 BY RT PCR: SARS Coronavirus 2 by RT PCR: NEGATIVE

## 2022-01-21 MED ORDER — LEVOFLOXACIN 750 MG PO TABS: 750.0000 mg | ORAL_TABLET | Freq: Every day | ORAL | 0 refills | Status: AC

## 2022-01-21 NOTE — ED Provider Notes (Signed)
[name]    CSN: [id number] Arrival date & time: 01/21/22  0947      History   Chief Complaint Chief Complaint  Patient presents with   Sore Throat    I took a covid test last night.  It was negative. This feels like strep throat - Entered by patient    HPI Taylor [name] is a 71 y.o. female.   Patient complains of two day history of sore throat, fatigue, mild sinus congestion, myalgias, and "bubbling" in her chest.  Last night she had fever. She denies pleuritic pain and shortness of breath. She has a history of COPD and pneumonia  The history is provided by the patient.    Past Medical History:  Diagnosis Date   Anxiety    Arthritis    "hands, neck, back" (07/22/2013)   Atrial fibrillation ([place])    CHEST DISCOMFORT    Chronic back pain    "started in lower back; getting to be all over my back" (07/22/2013)   Chronic bronchitis ([place])    "I've had it 2 years in a row; do have some trouble breathing at times; use an inhaler prn; not asthma" (07/22/2013)   Chronic neck pain    COPD (chronic obstructive pulmonary disease) ([facility])    DDD (degenerative disc disease), cervical    DDD (degenerative disc disease), lumbar    Depression    Dysrhythmia    GERD (gastroesophageal reflux disease)    Hepatitis 1971   "don't know if it was A or B" (07/22/2013)   HYPERLIPIDEMIA-MIXED 2005   "went away after I started taking fish oil" (07/22/2013)   Hypothyroidism    Palpitations    Pneumonia 2013   Type II diabetes mellitus ([place])    "diet controlled" (07/22/2013)    Patient Active Problem List   Diagnosis Date Noted   Secondary hypercoagulable state ([place]) 10/19/2019   Pleuritis 07/25/2013   CAP (community acquired pneumonia) 07/24/2013   COPD (chronic obstructive pulmonary disease) ([place]) 07/24/2013   Tobacco use 07/24/2013   Pneumonia 07/24/2013   Lung mass 07/24/2013   Pleural effusion 07/24/2013   UTI (urinary tract infection) 07/22/2013   Weakness 07/22/2013    HTN (hypertension) 07/22/2013   Leukocytosis 07/22/2013   Hyponatremia 07/22/2013   Acute renal failure ([place]) 07/22/2013   Atrial fibrillation ([place]) 08/06/2008    Past Surgical History:  Procedure Laterality Date   ANTERIOR CERVICAL DECOMP/DISCECTOMY FUSION  ?2003   "w/plating" (07/22/2013)   ATRIAL FIBRILLATION ABLATION N/A 09/21/2019   Procedure: ATRIAL FIBRILLATION ABLATION;  Surgeon: Constance Haw, MD;  Location: [facility];  Service: Cardiovascular;  Laterality: N/A;   BUNIONECTOMY Left 2013   DILATION AND CURETTAGE OF UTERUS  1976   PLANTAR FASCIA SURGERY Right 2011    OB History   No obstetric history on file.      Home Medications    Prior to Admission medications   Medication Sig Start Date End Date Taking? Authorizing Provider  acetaminophen (TYLENOL) 500 MG tablet Take 1,000 mg by mouth every 6 (six) hours as needed for moderate pain or headache.    Yes [provider]  albuterol (PROVENTIL HFA;VENTOLIN HFA) 108 (90 BASE) MCG/ACT inhaler Inhale 1 puff into the lungs every 6 (six) hours as needed for wheezing or shortness of breath. 08/01/13  Yes Kinnie Feil, MD  Ascorbic Acid (VITAMIN C) 1000 MG tablet Take 1,000 mg by mouth once a week.  11/15/16  Yes [provider]  buPROPion (WELLBUTRIN XL) 150 MG 24 hr tablet Take 300 mg by mouth daily.  11/19/11  Yes [provider]  cetirizine (ZYRTEC) 10 MG tablet Take 10 mg by mouth daily.    Yes [provider]  chlorhexidine (PERIDEX) 0.12 % solution Use as directed 15 mLs in the mouth or throat See admin instructions. Swish with 15 mls for 2 minutes then spit when needed for mouth sores 02/26/18  Yes [provider]  Cholecalciferol (VITAMIN D3) 5000 UNITS CAPS Take 5,000 mg by mouth daily.   Yes [provider]  Cinnamon 500 MG TABS Take 1,000 mg by mouth daily.   Yes [provider]  ESTRADIOL TD Place 1 mL onto the skin daily. Estradiol  7.'5mg'$ /gm cream   Yes [provider]  FLUoxetine (PROZAC) 20 MG tablet Take 20 mg by mouth in the morning and at bedtime.   Yes [provider]  Fluticasone-Salmeterol (ADVAIR) 500-50 MCG/DOSE AEPB Inhale 1 puff into the lungs in the morning and at bedtime.  06/30/18  Yes [provider]  furosemide (LASIX) 80 MG tablet Take 1 tablet (80 mg total) by mouth daily. 07/03/21  Yes Fay Records, MD  Guaifenesin 1200 MG TB12 Take 1,200 mg by mouth daily.   Yes [provider]  hydrOXYzine (ATARAX/VISTARIL) 50 MG tablet Take 50 mg by mouth daily as needed for itching.    Yes [provider]  ketoconazole (NIZORAL) 2 % cream Apply 1 application topically daily.  03/26/18  Yes [provider]  levofloxacin (LEVAQUIN) 750 MG tablet Take 1 tablet (750 mg total) by mouth daily for 7 days. 01/21/22 01/28/22 Yes Kandra Nicolas, MD  metoprolol tartrate (LOPRESSOR) 25 MG tablet Take 1 tablet (25 mg total) by mouth 2 (two) times daily. 11/20/21  Yes Fay Records, MD  montelukast (SINGULAIR) 10 MG tablet Take 10 mg by mouth at bedtime.   Yes [provider]  OIL OF OREGANO PO Take 1-2 capsules by mouth daily as needed (cold symptoms).    Yes [provider]  Omega-3 Fatty Acids (FISH OIL) 1000 MG CAPS Take 2,000 mg by mouth daily.   Yes [provider]  omeprazole (PRILOSEC) 20 MG capsule Take 20 mg by mouth daily.    Yes [provider]  potassium chloride (KLOR-CON M10) 10 MEQ tablet Take 1 tablet (10 mEq total) by mouth daily. 12/14/18  Yes Fay Records, MD  pregabalin (LYRICA) 75 MG capsule Take 75 mg by mouth 3 (three) times daily.    Yes [provider]  progesterone (PROMETRIUM) 100 MG capsule Take 300 mg by mouth every evening.  11/03/11  Yes [provider]  TESTOSTERONE TD Place 1-2 application onto the skin See admin instructions. Testosterone 2% cream. Apply 1 click's worth of testosterone cream on Sun,  Tues, Thurs, and Sat. Apply 2 click's worth on Mon, Wed, and Fri   Yes [provider]  thyroid (ARMOUR) 60 MG tablet Take 30-120 mg by mouth See admin instructions. Take 120 mg in the morning and 30 mg in the afternoon   Yes [provider]  tiZANidine (ZANAFLEX) 4 MG tablet Take 2 mg by mouth 4 (four) times daily as needed for muscle spasms.    Yes [provider]  TURMERIC PO Take 1,440 mg by mouth every evening.   Yes [provider]  Vitamin A 2400 MCG (8000 UT) CAPS Take 2,400 mcg by mouth daily.   Yes [provider]  Vitamin D-Vitamin K (VITAMIN K2-VITAMIN D3 PO) Take 1 capsule by mouth every evening.   Yes [provider]    Family History Family History  Problem Relation Age of Onset   Heart disease Other    Diabetes Maternal Grandfather    Heart attack Paternal Grandfather     Social History Social History   Tobacco Use   Smoking status: Former    Packs/day: 1.00    Years: 40.00    Total pack years: 40.00    Types: Cigarettes    Quit date: 12/22/2007    Years since quitting: 14.0   Smokeless tobacco: Never  Substance Use Topics   Alcohol use: Not Currently    Comment: 07/22/2013 "last drink was in ~ 2005"   Drug use: No     Allergies   Avelox [moxifloxacin hcl in nacl], Linezolid, Venlafaxine, Rosuvastatin, Clindamycin, Statins, Cephalexin, Nitrofurantoin, Other, and Penicillins   Review of Systems Review of Systems + sore throat ? cough No pleuritic pain No wheezing + nasal congestion + post-nasal drainage No sinus pain/pressure No itchy/red eyes No earache No hemoptysis No SOB + fever  No nausea No vomiting No abdominal pain No diarrhea No urinary symptoms No skin rash + fatigue + myalgias + headache Used OTC meds (Tylenol and muscle relaxant) without relief   Physical Exam Triage Vital Signs ED Triage Vitals  Enc Vitals Group     BP 01/21/22 1005 106/64     Pulse Rate 01/21/22 1005  75     Resp 01/21/22 1005 18     Temp 01/21/22 1005 99 F (37.2 C)     Temp Source 01/21/22 1005 Oral     SpO2 01/21/22 1005 91 %     Weight --      Height --      Head Circumference --      Peak Flow --      Pain Score 01/21/22 1006 0     Pain Loc --      Pain Edu? --      Excl. in [place]? --    No data found.  Updated Vital Signs BP 106/64 (BP Location: Right Arm)   Pulse 75   Temp 99 F (37.2 C) (Oral)   Resp 18   SpO2 91%   Visual Acuity Right Eye Distance:   Left Eye Distance:   Bilateral Distance:    Right Eye Near:   Left Eye Near:    Bilateral Near:     Physical Exam Nursing notes and Vital Signs reviewed. Appearance:  Patient appears stated age, and in no acute distress Eyes:  Pupils are equal, round, and reactive to light and accomodation.  Extraocular movement is intact.  Conjunctivae are not inflamed  Ears:  Canals normal.  Tympanic membranes normal.  Nose:  Mildly congested turbinates.  No sinus tenderness.  Pharynx:   Erythematous Neck:  Supple.  Mildly enlarged lateral nodes are present, tender to palpation bilaterally.   Lungs:  Decreased breath sounds; diffuse rhonchi posteriorly. Heart:  Regular rate and rhythm without murmurs, rubs, or gallops.  Abdomen:  Nontender without masses or hepatosplenomegaly.  Bowel sounds are present.  No CVA or flank tenderness.  Extremities:  No edema.  Skin:  No rash present.   UC Treatments / Results  Labs (all labs ordered are listed, but only abnormal results are displayed) Labs Reviewed  POCT RAPID STREP A (OFFICE) - Abnormal; Notable for the following components:      Result Value  Rapid Strep A Screen Positive (*)    All other components within normal limits  SARS CORONAVIRUS 2 BY RT PCR    EKG   Radiology DG Chest 2 View  Result Date: 01/21/2022 CLINICAL DATA:  Cough and fever EXAM: CHEST - 2 VIEW COMPARISON:  Chest 04/04/2021 FINDINGS: Right perihilar airspace density extending into the right upper  lobe. This was not present previously Left lung clear. Heart size upper normal without heart failure. No effusion. IMPRESSION: Right perihilar airspace disease extending to the right upper lobe. Probable pneumonia given history. Followup PA and lateral chest X-ray is recommended in 3-4 weeks following trial of antibiotic therapy to ensure resolution and exclude underlying malignancy. Electronically Signed   By: Franchot Gallo M.D.   On: 01/21/2022 10:51    Procedures Procedures (including critical care time)  Medications Ordered in UC Medications - No data to display  Initial Impression / Assessment and Plan / UC Course  I have reviewed the triage vital signs and the nursing notes.  Pertinent labs & imaging results that were available during my care of the patient were reviewed by me and considered in my medical decision making (see chart for details).    Begin Levaquin for one week (patient has tolerated in the past). Followup with Family Doctor if not improved in one week.  Followup with Family Doctor in about 3 weeks for repeat chest x-ray COVID19 PCR pending.   Final Clinical Impressions(s) / UC Diagnoses   Final diagnoses:  Community acquired pneumonia of right upper lobe of lung  Strep pharyngitis     Discharge Instructions      Take plain guaifenesin ('1200mg'$  extended release tabs such as Mucinex) twice daily, with plenty of water, for cough and congestion.  Get adequate rest.   May take Delsym Cough Suppressant ("12 Hour Cough Relief") at bedtime for nighttime cough.  Try warm salt water gargles for sore throat.  Stop all antihistamines for now, and other non-prescription cough/cold preparations. Take Tylenol as needed for fever, sore throat, etc. If symptoms become significantly worse during the night or over the weekend, proceed to the local emergency room.   If your COVID-19 test is positive, isolate yourself for five days from today.  At the end of five days you may  end isolation if your symptoms have cleared or improved, and you have not had a fever for 24 hours. At this time you should wear a mask for five more days when you are around others.                ED Prescriptions     Medication Sig Dispense Auth. Provider   levofloxacin (LEVAQUIN) 750 MG tablet Take 1 tablet (750 mg total) by mouth daily for 7 days. 7 tablet Kandra Nicolas, MD         Kandra Nicolas, MD 01/23/22 [phone]

## 2022-01-21 NOTE — ED Triage Notes (Signed)
Patient states that she's had a sore throat x 2 days, home COVID test was negative.  Patient has gained 7lbs in 3 days which in the past is an indicator that she has pneumonia.  Some cough.  Patient has taken Tylenol and muscle relaxer.

## 2022-01-21 NOTE — Discharge Instructions (Addendum)
Take plain guaifenesin ('1200mg'$  extended release tabs such as Mucinex) twice daily, with plenty of water, for cough and congestion.  Get adequate rest.   May take Delsym Cough Suppressant ("12 Hour Cough Relief") at bedtime for nighttime cough.  Try warm salt water gargles for sore throat.  Stop all antihistamines for now, and other non-prescription cough/cold preparations. Take Tylenol as needed for fever, sore throat, etc. If symptoms become significantly worse during the night or over the weekend, proceed to the local emergency room.   If your COVID-19 test is positive, isolate yourself for five days from today.  At the end of five days you may end isolation if your symptoms have cleared or improved, and you have not had a fever for 24 hours. At this time you should wear a mask for five more days when you are around others.

## 2022-01-22 ENCOUNTER — Telehealth: Payer: Self-pay | Admitting: Emergency Medicine

## 2022-01-22 ENCOUNTER — Telehealth: Payer: Self-pay | Admitting: Family Medicine

## 2022-01-22 MED ORDER — DOXYCYCLINE HYCLATE 100 MG PO CAPS: 100.0000 mg | ORAL_CAPSULE | Freq: Two times a day (BID) | ORAL | 0 refills | Status: AC

## 2022-01-22 NOTE — Telephone Encounter (Signed)
Patient  called started Levaquin yesterday for pneumonia, fells like stomach is going to explode. Please change medicine. [phone]

## 2022-01-22 NOTE — Telephone Encounter (Signed)
Patient reports Levaquin is going to make her stomach explode.  Advised patient to discontinue Levaquin now and start Doxycycline.  Advised patient if symptoms worsen and/or unresolved please follow-up with PCP for further evaluation.

## 2022-01-22 NOTE — Telephone Encounter (Signed)
Advised patient to stop Levequin, sent Doxy to [facility].

## 2022-02-05 ENCOUNTER — Ambulatory Visit: Payer: PPO | Attending: Physician Assistant | Admitting: Physician Assistant

## 2022-02-05 ENCOUNTER — Encounter: Payer: Self-pay | Admitting: Physician Assistant

## 2022-02-05 VITALS — BP 118/72 | HR 68 | Ht 66.0 in | Wt 184.0 lb

## 2022-02-05 DIAGNOSIS — I739 Peripheral vascular disease, unspecified: Secondary | ICD-10-CM

## 2022-02-05 DIAGNOSIS — I5040 Unspecified combined systolic (congestive) and diastolic (congestive) heart failure: Secondary | ICD-10-CM

## 2022-02-05 DIAGNOSIS — E782 Mixed hyperlipidemia: Secondary | ICD-10-CM

## 2022-02-05 DIAGNOSIS — R918 Other nonspecific abnormal finding of lung field: Secondary | ICD-10-CM

## 2022-02-05 DIAGNOSIS — I10 Essential (primary) hypertension: Secondary | ICD-10-CM | POA: Diagnosis not present

## 2022-02-05 DIAGNOSIS — I480 Paroxysmal atrial fibrillation: Secondary | ICD-10-CM | POA: Diagnosis not present

## 2022-02-05 MED ORDER — EMPAGLIFLOZIN 10 MG PO TABS: 10.0000 mg | ORAL_TABLET | Freq: Every day | ORAL | 11 refills | Status: DC

## 2022-02-05 NOTE — Patient Instructions (Signed)
Medication Instructions:  1.Start jardiance 10 mg daily before breakfast 2.Decrease lasix to 40 mg daily once you start the jardiance. You can take one-half tablet of the 80 mg. *If you need a refill on your cardiac medications before your next appointment, please call your pharmacy*   Lab Work: TSH today If you have labs (blood work) drawn today and your tests are completely normal, you will receive your results only by: [place] (if you have MyChart) OR A paper copy in the mail If you have any lab test that is abnormal or we need to change your treatment, we will call you to review the results.   Follow-Up: At [facility], you and your health needs are our priority.  As part of our continuing mission to provide you with exceptional heart care, we have created designated Provider Care Teams.  These Care Teams include your primary Cardiologist (physician) and Advanced Practice Providers (APPs -  Physician Assistants and Nurse Practitioners) who all work together to provide you with the care you need, when you need it.  Your next appointment:   3 month(s)  The format for your next appointment:   In Person  Provider:   Dorris Carnes, MD    Other Instructions You have been referred to [facility].   Important Information About Sugar

## 2022-02-05 NOTE — Progress Notes (Signed)
Office Visit    Patient Name: Taylor [name] Date of Encounter: 02/05/2022  PCP:  Larene Beach, MD   [facility]  Cardiologist:  Dorris Carnes, MD  Advanced Practice Provider:  No care team member to display Electrophysiologist:  Will Meredith Leeds, MD   HPI    Taylor [name] is a 71 y.o. female past medical history significant for OSA, COPD, DM, hypothyroidism, GERD, palpitations, atrial fibrillation presents today for hospital follow-up.  She was last seen by Dr. Harrington Challenger 06/12/2021.  Patient with atrial fibrillation initially in 2009 around the time of URI.  Had intermittently after.  Placed on flecainide 100 mg twice daily and she was seen in the clinic in 2020.  In 2021 she had recurrent palpitations/atrial fibrillation and was seen by Dr. Curt Bears.  She underwent ablation for atrial fibrillation in early 2021.  After this her flecainide was stopped.  With no recurrence of atrial fibrillation, Xarelto was also discontinued.  She was seen by Dr. Curt Bears and denied palpitations, no chest pain.  She had pneumonia couple times July and August 2022 and her breathing was back to normal.  Today, she states that she is having some right-sided pain but it does not happen often.  She did not seem too concerned about it.  She is asking about a referral to a pulmonologist due to a pulmonary nodule that was found on her CT scan.  She also was supposed to start [place] but has not yet.  We went over her echocardiogram results which showed mild to moderate TR, normal left ventricular ejection fraction of 60 to 65% with normal wall motion.  Moderate diastolic dysfunction.  She did have some fluid overload when she was in the hospital being treated for her pneumonia.  She is currently on 80 mg of Lasix daily.  EKG was stable with normal sinus rhythm and PACs.  Reports no chest pain, pressure, or tightness. No edema, orthopnea, PND. Reports no palpitations.    Past Medical History     Past Medical History:  Diagnosis Date   Anxiety    Arthritis    "hands, neck, back" (07/22/2013)   Atrial fibrillation (HCC)    CHEST DISCOMFORT    Chronic back pain    "started in lower back; getting to be all over my back" (07/22/2013)   Chronic bronchitis ([place])    "I've had it 2 years in a row; do have some trouble breathing at times; use an inhaler prn; not asthma" (07/22/2013)   Chronic neck pain    COPD (chronic obstructive pulmonary disease) (HCC)    DDD (degenerative disc disease), cervical    DDD (degenerative disc disease), lumbar    Depression    Dysrhythmia    GERD (gastroesophageal reflux disease)    Hepatitis 1971   "don't know if it was A or B" (07/22/2013)   HYPERLIPIDEMIA-MIXED 2005   "went away after I started taking fish oil" (07/22/2013)   Hypothyroidism    Palpitations    Pneumonia 2013   Type II diabetes mellitus ([place])    "diet controlled" (07/22/2013)   Past Surgical History:  Procedure Laterality Date   ANTERIOR CERVICAL DECOMP/DISCECTOMY FUSION  ?2003   "w/plating" (07/22/2013)   ATRIAL FIBRILLATION ABLATION N/A 09/21/2019   Procedure: ATRIAL FIBRILLATION ABLATION;  Surgeon: Constance Haw, MD;  Location: [facility];  Service: Cardiovascular;  Laterality: N/A;   BUNIONECTOMY Left 2013   [place]  PLANTAR FASCIA SURGERY Right 2011    Allergies  Allergies  Allergen Reactions   Avelox [Moxifloxacin Hcl In Nacl] Hives   Linezolid Rash   Venlafaxine Nausea Only and Rash   Rosuvastatin Other (See Comments)    myalgias   Cephalexin Itching and Rash   Clindamycin Diarrhea   Nitrofurantoin Itching and Rash   Other Rash    Brewer yeast and Bakers yeast causes cracks in mouth stomach bloating    Penicillins Itching and Rash    Has patient had a PCN reaction causing anaphylaxis, immediate rash, facial/tongue/throat swelling, SOB or lightheadedness with hypotension? no Has patient had a PCN reaction causing  severe rash involving mucus membranes or skin necrosis?  no Has patient had a PCN reaction that required hospitilization?no  Has patient had a PCN reaction occurring within the last 10 years?  no If all of the above answers are "no" then may proceed with cephalosporin use.   Statins Other (See Comments)    Muscle pain     EKGs/Labs/Other Studies Reviewed:   The following studies were reviewed today:  Echocardiogram (external report from [facility]) 01/26/2022 Normal LVEF 60 to 65%, wall motion was normal, moderate diastolic dysfunction, mild to moderate TR.  CT angio pulmonary (external report from [facility]) 01/26/2022 No evidence of pulmonary embolism.  Mild cardiomegaly.  No a sending thoracic aortic aneurysm.  There is a multifocal right greater than left pneumonia with an underlying component of edema.  Small right pleural effusion and trace left pleural effusion.  2.7 cm masslike density in the right lower lobe.  Underlying mass not excluded.  I  EKG:  EKG is  ordered today.  The ekg ordered today demonstrates normal sinus rhythm with PACs, rate 69 bpm  Recent Labs: No results found for requested labs within last 365 days.  Recent Lipid Panel    Component Value Date/Time   CHOL 210 (H) 04/25/2019 1543   TRIG 159 (H) 04/25/2019 1543   HDL 54 04/25/2019 1543   CHOLHDL 3.9 04/25/2019 1543   CHOLHDL 4.2 CALC 11/17/2007 0844   VLDL 18 11/17/2007 0844   LDLCALC 128 (H) 04/25/2019 1543    Home Medications   Current Meds  Medication Sig   acetaminophen (TYLENOL) 500 MG tablet Take 1,000 mg by mouth every 6 (six) hours as needed for moderate pain or headache.    albuterol (PROVENTIL HFA;VENTOLIN HFA) 108 (90 BASE) MCG/ACT inhaler Inhale 1 puff into the lungs every 6 (six) hours as needed for wheezing or shortness of breath.   Ascorbic Acid (VITAMIN C) 1000 MG tablet Take 1,000 mg by mouth once a week.    buPROPion (WELLBUTRIN XL) 150 MG 24 hr tablet Take 300 mg by mouth daily.     cetirizine (ZYRTEC) 10 MG tablet Take 10 mg by mouth daily.    chlorhexidine (PERIDEX) 0.12 % solution Use as directed 15 mLs in the mouth or throat See admin instructions. Swish with 15 mls for 2 minutes then spit when needed for mouth sores   Cinnamon 500 MG TABS Take 1,000 mg by mouth daily.   empagliflozin (JARDIANCE) 10 MG TABS tablet Take 1 tablet (10 mg total) by mouth daily before breakfast.   ESTRADIOL TD Place 1 mL onto the skin daily. Estradiol 7.'5mg'$ /gm cream   FLUoxetine (PROZAC) 20 MG tablet Take 20 mg by mouth in the morning and at bedtime.   Fluticasone-Salmeterol (ADVAIR) 500-50 MCG/DOSE AEPB Inhale 1 puff into the lungs in the morning and at bedtime.  furosemide (LASIX) 80 MG tablet Take 1 tablet (80 mg total) by mouth daily.   Guaifenesin 1200 MG TB12 Take 1,200 mg by mouth daily.   hydrOXYzine (ATARAX/VISTARIL) 50 MG tablet Take 50 mg by mouth daily as needed for itching.    ketoconazole (NIZORAL) 2 % cream Apply 1 application topically daily.    metoprolol tartrate (LOPRESSOR) 25 MG tablet Take 1 tablet (25 mg total) by mouth 2 (two) times daily.   montelukast (SINGULAIR) 10 MG tablet Take 10 mg by mouth at bedtime.   OIL OF OREGANO PO Take 1-2 capsules by mouth daily as needed (cold symptoms).    Omega-3 Fatty Acids (FISH OIL) 1000 MG CAPS Take 2,000 mg by mouth daily.   omeprazole (PRILOSEC) 20 MG capsule Take 20 mg by mouth daily.    potassium chloride (KLOR-CON M10) 10 MEQ tablet Take 1 tablet (10 mEq total) by mouth daily.   pregabalin (LYRICA) 75 MG capsule Take 75 mg by mouth 3 (three) times daily.    progesterone (PROMETRIUM) 100 MG capsule Take 300 mg by mouth every evening.    TESTOSTERONE TD Place 1-2 application onto the skin See admin instructions. Testosterone 2% cream. Apply 1 click's worth of testosterone cream on Sun, Tues, Thurs, and Sat. Apply 2 click's worth on Mon, Wed, and Fri   thyroid (ARMOUR) 60 MG tablet Take 30-120 mg by mouth See admin  instructions. Take 120 mg in the morning and 30 mg in the afternoon   tiZANidine (ZANAFLEX) 4 MG tablet Take 2 mg by mouth 4 (four) times daily as needed for muscle spasms.    TURMERIC PO Take 1,440 mg by mouth every evening.   Vitamin A 2400 MCG (8000 UT) CAPS Take 2,400 mcg by mouth daily.   Vitamin D-Vitamin K (VITAMIN K2-VITAMIN D3 PO) Take 1 capsule by mouth every evening.     Review of Systems     All other systems reviewed and are otherwise negative except as noted above.  Physical Exam    VS:  BP 118/72   Pulse 68   Ht '5\' 6"'$  (1.676 m)   Wt 184 lb (83.5 kg)   SpO2 96%   BMI 29.70 kg/m  , BMI Body mass index is 29.7 kg/m.  Wt Readings from Last 3 Encounters:  02/05/22 184 lb (83.5 kg)  06/12/21 247 lb (112 kg)  04/04/21 246 lb (111.6 kg)     GEN: Well nourished, well developed, in no acute distress. HEENT: normal. Neck: Supple, no JVD, carotid bruits, or masses. Cardiac: RRR, no murmurs, rubs, or gallops. No clubbing, cyanosis, edema.  Radials/PT 2+ and equal bilaterally.  Respiratory:  Respirations regular and unlabored, clear to auscultation bilaterally. GI: Soft, nontender, nondistended. MS: No deformity or atrophy. Skin: Warm and dry, no rash. Neuro:  Strength and sensation are intact. Psych: Normal affect.  Assessment & Plan    Diastolic heart failure -Euvolemic on exam today -We will start her on Farxiga/Jardiance 10 mg daily and have her Lasix to 40 mg daily -Continue daily weights -Patient knows if she gains 2 pounds overnight or 5 pounds in a week to call our office or if she notices any lower extremity edema or shortness of breath   PAF -Normal sinus rhythm with PACs on EKG -off all medications  PAD -continue current medications -mild plaquing on CT scan  HL -She will need a follow-up lipid panel the next time she is in the office -continue fish oil  HTN -BP well controlled  today -Continue Lopressor 25 mg twice daily -Would be a good idea  to start her on Entresto at some point if able  6. Lung nodule -Referral to Dr. Lamonte Sakai for possible lung biopsy        Disposition: Follow up 3 months with Dorris Carnes, MD or APP.  Signed, Elgie Collard, PA-C 02/05/2022, 5:04 PM [place]

## 2022-02-06 LAB — TSH: TSH: 0.786 u[IU]/mL (ref 0.450–4.500)

## 2022-02-12 NOTE — Telephone Encounter (Signed)
Left msg for patient  

## 2022-02-13 NOTE — Telephone Encounter (Signed)
Spoke with patient and made her aware of note below per [facility]. She took her last dose of steroids yesterday and she will start daily weights again in the next day or so and send the readings via mychart.

## 2022-02-19 ENCOUNTER — Ambulatory Visit: Payer: PPO | Admitting: Emergency Medicine

## 2022-02-19 ENCOUNTER — Encounter: Payer: Self-pay | Admitting: Emergency Medicine

## 2022-02-19 DIAGNOSIS — J449 Chronic obstructive pulmonary disease, unspecified: Secondary | ICD-10-CM | POA: Diagnosis not present

## 2022-02-19 DIAGNOSIS — J189 Pneumonia, unspecified organism: Secondary | ICD-10-CM | POA: Diagnosis not present

## 2022-02-19 DIAGNOSIS — J301 Allergic rhinitis due to pollen: Secondary | ICD-10-CM | POA: Diagnosis not present

## 2022-02-19 DIAGNOSIS — K219 Gastro-esophageal reflux disease without esophagitis: Secondary | ICD-10-CM

## 2022-02-19 DIAGNOSIS — G4733 Obstructive sleep apnea (adult) (pediatric): Secondary | ICD-10-CM

## 2022-02-19 DIAGNOSIS — J309 Allergic rhinitis, unspecified: Secondary | ICD-10-CM | POA: Insufficient documentation

## 2022-02-19 MED ORDER — PANTOPRAZOLE SODIUM 40 MG PO TBEC: 40.0000 mg | DELAYED_RELEASE_TABLET | Freq: Two times a day (BID) | ORAL | 5 refills | Status: DC

## 2022-02-19 NOTE — Assessment & Plan Note (Signed)
Continue CPAP every night. 

## 2022-02-19 NOTE — Assessment & Plan Note (Signed)
Breakthrough symptoms that are contributing to her respiratory issues.  Plan to change her omeprazole to pantoprazole twice daily until next visit, then assess status.

## 2022-02-19 NOTE — Patient Instructions (Addendum)
We will plan to repeat your CT scan of the chest in November 2023.  We will obtain copies of your CT scan of the chest from [facility] that was performed on 01/26/2022. We will perform pulmonary function testing Continue your Advair/Wixela for now.  We may decide to change you to an alternative inhaler medication at some point going forward. Stop your omeprazole.  We will start pantoprazole 40 mg twice a day until your next visit. We will arrange for a formal swallowing evaluation to rule out any evidence of silent aspiration contributing to your recurrent pneumonias. Continue Zyrtec and Singulair as you have been taking them. Continue CPAP every night. Follow with Dr. Lamonte Sakai next available after your pulmonary function testing so we can review the results together.

## 2022-02-19 NOTE — Assessment & Plan Note (Signed)
Unclear why she has recurrently flared.  On at least 1 occasion (2019) she had documented Enterobacter, perhaps she is colonized.  Recent admission identified a right lower lobe opacity and she needs a follow-up CT to ensure clearance.  We will do this in November.  No overt aspiration symptoms but must consider occult aspiration.  We will perform a swallowing evaluation.

## 2022-02-19 NOTE — Assessment & Plan Note (Signed)
Continue Zyrtec and Singulair as you have been taking them.

## 2022-02-19 NOTE — Assessment & Plan Note (Signed)
Some of her pneumonias are likely acute exacerbations with out overt infiltrates.  She has poorly controlled GERD which may explain recurrent flares.  We will temporarily increase her GERD therapy, change omeprazole to pantoprazole twice a day until next visit.  Need to quantify her degree of obstruction and we will perform pulmonary function testing.  Based on this may decide to change her Advair to an alternative.  She would likely benefit from a nonpowdered formulation   We will perform pulmonary function testing Continue your Advair/Wixela for now.  We may decide to change you to an alternative inhaler medication at some point going forward. contributing to your recurrent pneumonias. Follow with Dr. Lamonte Sakai next available after your pulmonary function testing so we can review the results together.

## 2022-02-19 NOTE — Progress Notes (Signed)
Subjective:    Patient ID: Taylor [name], female    DOB: [birth year], 71 y.o.   MRN: [id number]  HPI 71 year old former smoker (40 pack years) with a history of atrial fibrillation, DDD, diabetes, hypothyroidism, OSA, fibromyalgia, COPD with chronic bronchitic symptoms.  Apparently she has a history of a lung mass that was documented in 2015.  CT chest done 06/18/2020 at [facility] reported calcified pulmonary nodular disease and noted resolution of a wedge-shaped right upper lobe opacity seen 06/16/2019.  No mention of any other mass on CT. She has been followed by Dr. Girard Cooter with pulmonary for recurrent pneumonias, infiltrates on CT chest.  Underwent bronchoscopy 05/28/2018, cultures apparently grew out Enterobacter. She notes flaring sx a few times a year - sx include chest congestion w increased cough, fatigue. Mucous thicker, yellow. No blood. She is unsure about any aspiration sx, has never been evaluated formally.  Zyrtec, singulair, she has breakthrough GERD on omeprazole '40mg'$  qhs, and '20mg'$  qam.   She was recently admitted to [facility] with symptoms consistent with a COPD exacerbation.  CT-PA was performed that ruled out PE but which reportedly showed multifocal right greater than left pulmonary infiltrates consistent with pneumonia  CT-PA 01/26/2022 [facility], report available, images not currently available.  Showed multifocal right greater than left pulmonary infiltrates with a 2.7 cm masslike density in the right lower lobe.  There was nonspecific right greater than left mediastinal and hilar lymphadenopathy   Review of Systems As per HPI  Past Medical History:  Diagnosis Date   Anxiety    Arthritis    "hands, neck, back" (07/22/2013)   Atrial fibrillation (HCC)    CHEST DISCOMFORT    Chronic back pain    "started in lower back; getting to be all over my back" (07/22/2013)   Chronic bronchitis (HCC)    "I've had it 2 years in a row; do have some trouble breathing at times; use an  inhaler prn; not asthma" (07/22/2013)   Chronic neck pain    COPD (chronic obstructive pulmonary disease) (HCC)    DDD (degenerative disc disease), cervical    DDD (degenerative disc disease), lumbar    Depression    Dysrhythmia    GERD (gastroesophageal reflux disease)    Hepatitis 1971   "don't know if it was A or B" (07/22/2013)   HYPERLIPIDEMIA-MIXED 2005   "went away after I started taking fish oil" (07/22/2013)   Hypothyroidism    Palpitations    Pneumonia 2013   Type II diabetes mellitus ([place])    "diet controlled" (07/22/2013)     Family History  Problem Relation Age of Onset   Heart disease Other    Diabetes Maternal Grandfather    Heart attack Paternal Grandfather      Social History   Socioeconomic History   Marital status: Married    Spouse name: Not on file   Number of children: Not on file   Years of education: Not on file   Highest education level: Not on file  Occupational History   Not on file  Tobacco Use   Smoking status: Former    Packs/day: 1.00    Years: 40.00    Total pack years: 40.00    Types: Cigarettes    Quit date: 12/22/2007    Years since quitting: 14.1   Smokeless tobacco: Never  Substance and Sexual Activity   Alcohol use: Not Currently    Comment: 07/22/2013 "last drink was in ~ 2005"   Drug use: No  Sexual activity: Not Currently  Other Topics Concern   Not on file  Social History Narrative   Not on file   Social Determinants of Health   Financial Resource Strain: Not on file  Food Insecurity: Not on file  Transportation Needs: Not on file  Physical Activity: Not on file  Stress: Not on file  Social Connections: Not on file  Intimate Partner Violence: Not on file     Allergies  Allergen Reactions   Avelox [Moxifloxacin Hcl In Nacl] Hives   Linezolid Rash   Venlafaxine Nausea Only and Rash   Rosuvastatin Other (See Comments)    myalgias   Cephalexin Itching and Rash   Clindamycin Diarrhea   Nitrofurantoin Itching  and Rash   Other Rash    Brewer Advertising copywriter causes cracks in mouth stomach bloating    Penicillins Itching and Rash    Has patient had a PCN reaction causing anaphylaxis, immediate rash, facial/tongue/throat swelling, SOB or lightheadedness with hypotension? no Has patient had a PCN reaction causing severe rash involving mucus membranes or skin necrosis?  no Has patient had a PCN reaction that required hospitilization?no  Has patient had a PCN reaction occurring within the last 10 years?  no If all of the above answers are "no" then may proceed with cephalosporin use.   Statins Other (See Comments)    Muscle pain     Outpatient Medications Prior to Visit  Medication Sig Dispense Refill   acetaminophen (TYLENOL) 500 MG tablet Take 1,000 mg by mouth every 6 (six) hours as needed for moderate pain or headache.      albuterol (PROVENTIL HFA;VENTOLIN HFA) 108 (90 BASE) MCG/ACT inhaler Inhale 1 puff into the lungs every 6 (six) hours as needed for wheezing or shortness of breath. 1 Inhaler 1   Ascorbic Acid (VITAMIN C) 1000 MG tablet Take 1,000 mg by mouth once a week.      buPROPion (WELLBUTRIN XL) 150 MG 24 hr tablet Take 300 mg by mouth daily.      cetirizine (ZYRTEC) 10 MG tablet Take 10 mg by mouth daily.      chlorhexidine (PERIDEX) 0.12 % solution Use as directed 15 mLs in the mouth or throat See admin instructions. Swish with 15 mls for 2 minutes then spit when needed for mouth sores     Cinnamon 500 MG TABS Take 1,000 mg by mouth daily.     empagliflozin (JARDIANCE) 10 MG TABS tablet Take 1 tablet (10 mg total) by mouth daily before breakfast. 30 tablet 11   ESTRADIOL TD Place 1 mL onto the skin daily. Estradiol 7.'5mg'$ /gm cream     FLUoxetine (PROZAC) 20 MG tablet Take 20 mg by mouth in the morning and at bedtime.     Fluticasone-Salmeterol (ADVAIR) 500-50 MCG/DOSE AEPB Inhale 1 puff into the lungs in the morning and at bedtime.      furosemide (LASIX) 80 MG tablet Take 1  tablet (80 mg total) by mouth daily. 90 tablet 3   Guaifenesin 1200 MG TB12 Take 1,200 mg by mouth daily.     hydrOXYzine (ATARAX/VISTARIL) 50 MG tablet Take 50 mg by mouth daily as needed for itching.      ketoconazole (NIZORAL) 2 % cream Apply 1 application topically daily.      metoprolol tartrate (LOPRESSOR) 25 MG tablet Take 1 tablet (25 mg total) by mouth 2 (two) times daily. 180 tablet 2   montelukast (SINGULAIR) 10 MG tablet Take 10 mg by mouth at  bedtime.     OIL OF OREGANO PO Take 1-2 capsules by mouth daily as needed (cold symptoms).      Omega-3 Fatty Acids (FISH OIL) 1000 MG CAPS Take 2,000 mg by mouth daily.     omeprazole (PRILOSEC) 20 MG capsule Take 20 mg by mouth daily.      potassium chloride (KLOR-CON M10) 10 MEQ tablet Take 1 tablet (10 mEq total) by mouth daily. 90 tablet 3   pregabalin (LYRICA) 75 MG capsule Take 75 mg by mouth 3 (three) times daily.      progesterone (PROMETRIUM) 100 MG capsule Take 300 mg by mouth every evening.      TESTOSTERONE TD Place 1-2 application onto the skin See admin instructions. Testosterone 2% cream. Apply 1 click's worth of testosterone cream on Sun, Tues, Thurs, and Sat. Apply 2 click's worth on Mon, Wed, and Fri     thyroid (ARMOUR) 60 MG tablet Take 30-120 mg by mouth See admin instructions. Take 120 mg in the morning and 30 mg in the afternoon     tiZANidine (ZANAFLEX) 4 MG tablet Take 2 mg by mouth 4 (four) times daily as needed for muscle spasms.      TURMERIC PO Take 1,440 mg by mouth every evening.     Vitamin A 2400 MCG (8000 UT) CAPS Take 2,400 mcg by mouth daily.     Vitamin D-Vitamin K (VITAMIN K2-VITAMIN D3 PO) Take 1 capsule by mouth every evening.     No facility-administered medications prior to visit.        Objective:   Physical Exam  Vitals:   02/19/22 1046  BP: 124/72  Pulse: (!) 59  Temp: 98.4 F (36.9 C)  TempSrc: Oral  SpO2: 97%  Weight: 191 lb 12.8 oz (87 kg)  Height: '5\' 6"'$  (1.676 m)   Gen:  Pleasant, overweight woman, in no distress,  normal affect  ENT: No lesions,  mouth clear,  oropharynx clear, no postnasal drip  Neck: No JVD, no stridor  Lungs: No use of accessory muscles, no crackles or wheezing on normal respiration, no wheeze on forced expiration  Cardiovascular: RRR, heart sounds normal, no murmur or gallops, no peripheral edema  Musculoskeletal: No deformities, no cyanosis or clubbing  Neuro: alert, awake, non focal  Skin: Warm, no lesions or rash     Assessment & Plan:   Recurrent pneumonia Unclear why she has recurrently flared.  On at least 1 occasion (2019) she had documented Enterobacter, perhaps she is colonized.  Recent admission identified a right lower lobe opacity and she needs a follow-up CT to ensure clearance.  We will do this in November.  No overt aspiration symptoms but must consider occult aspiration.  We will perform a swallowing evaluation.  COPD (chronic obstructive pulmonary disease) Some of her pneumonias are likely acute exacerbations with out overt infiltrates.  She has poorly controlled GERD which may explain recurrent flares.  We will temporarily increase her GERD therapy, change omeprazole to pantoprazole twice a day until next visit.  Need to quantify her degree of obstruction and we will perform pulmonary function testing.  Based on this may decide to change her Advair to an alternative.  She would likely benefit from a nonpowdered formulation   We will perform pulmonary function testing Continue your Advair/Wixela for now.  We may decide to change you to an alternative inhaler medication at some point going forward. contributing to your recurrent pneumonias. Follow with Dr. Lamonte Sakai next available after your pulmonary  function testing so we can review the results together.  Allergic rhinitis Continue Zyrtec and Singulair as you have been taking them.  OSA (obstructive sleep apnea) Continue CPAP every night.  GERD  (gastroesophageal reflux disease) Breakthrough symptoms that are contributing to her respiratory issues.  Plan to change her omeprazole to pantoprazole twice daily until next visit, then assess status.   Baltazar Apo, MD, PhD 02/19/2022, 11:18 AM [facility] [phone] or if no answer before 7:00PM call [phone] For any issues after 7:00PM please call eLink [phone]

## 2022-02-19 NOTE — Addendum Note (Signed)
Addended by: Gavin Potters R on: 02/19/2022 12:08 PM   Modules accepted: Orders

## 2022-02-19 NOTE — Addendum Note (Signed)
Addended by: Gavin Potters R on: 02/19/2022 03:54 PM   Modules accepted: Orders

## 2022-02-20 ENCOUNTER — Encounter: Payer: Self-pay | Admitting: Emergency Medicine

## 2022-02-20 DIAGNOSIS — J189 Pneumonia, unspecified organism: Secondary | ICD-10-CM

## 2022-02-24 NOTE — Telephone Encounter (Signed)
Spk to patient who was confused on why Speech was contacting her for a swallowing evaluation. States she will call them and schedule appointment nothing further

## 2022-02-25 ENCOUNTER — Telehealth: Payer: Self-pay | Admitting: Emergency Medicine

## 2022-02-25 NOTE — Telephone Encounter (Signed)
Patient called to request a new referral for a bariatric swallow test.  She stated that the doctor sent a referral for a speech pathologist but she does not need that one.  Please call patient to discuss further at [phone]

## 2022-02-26 NOTE — Telephone Encounter (Signed)
Actually this should be okay.  Often the modified barium swallow is done with the assistance of a speech therapist so they can interpret the results, ensure adequate swallowing and no evidence for aspiration.

## 2022-02-26 NOTE — Telephone Encounter (Signed)
Please refer to mychart encounter from 9/14.

## 2022-02-26 NOTE — Telephone Encounter (Signed)
Mychart message sent by pt: Taylor Basset "Jayn"  P [facility] (supporting Collene Gobble, MD) 6 days ago    Hi. So nice to meet you all yesterday. I felt very comfortable. You recommended I have a swallowing evaluation. I just got a call from [facility] ( I think that's what she said) to set up an appointment for speech therapy. There's a glitch somewhere. The number they called from is: [phone].  Will you please call them (or fax) and get it straightened out?  Thanks, Taylor [name]    Dr. Lamonte Sakai, please advise.

## 2022-03-10 ENCOUNTER — Ambulatory Visit: Payer: PPO

## 2022-03-10 ENCOUNTER — Telehealth: Payer: Self-pay

## 2022-03-10 NOTE — Telephone Encounter (Signed)
Estill Bamberg- There has been some confusion regarding a referral for Thressa from Dr. Lamonte Sakai. I just called Delayla and she stated Dr. Lamonte Sakai told her he wanted her to have a modified barium swallow evaluation (MBS). Those are completed with a speech pathologist at either [facility] or [facility]. I am a speech pathologist at [facility] at [place], a free-standing [facility] outpatient rehabilitation office.  To prevent future confusion about this, Jarrett Soho or Helene Kelp at [phone] can walk you through the process of how to order a modified barium swallow evaluation, so that the order ends up in the correct workque.  Thank you!

## 2022-03-14 NOTE — Telephone Encounter (Signed)
Thank you for help in this situation.

## 2022-03-19 NOTE — Telephone Encounter (Signed)
Patient called back and states that the speech pathologist called her and they do not do the swallow test. Patient needs a referral somewhere else. Please advise.

## 2022-03-19 NOTE — Telephone Encounter (Signed)
Dr. Lamonte Sakai, please advise on message from pt.

## 2022-03-20 NOTE — Telephone Encounter (Signed)
Ok to change the order to the modified barium swallow

## 2022-03-24 ENCOUNTER — Other Ambulatory Visit (HOSPITAL_COMMUNITY): Payer: Self-pay | Admitting: *Deleted

## 2022-03-24 DIAGNOSIS — R1310 Dysphagia, unspecified: Secondary | ICD-10-CM

## 2022-04-02 ENCOUNTER — Ambulatory Visit (HOSPITAL_COMMUNITY)
Admission: RE | Admit: 2022-04-02 | Discharge: 2022-04-02 | Disposition: A | Discharge status: Home / Self Care | Payer: PPO | Source: Ambulatory Visit | Attending: Emergency Medicine | Admitting: Emergency Medicine

## 2022-04-02 DIAGNOSIS — R1310 Dysphagia, unspecified: Secondary | ICD-10-CM | POA: Diagnosis present

## 2022-04-02 DIAGNOSIS — J189 Pneumonia, unspecified organism: Secondary | ICD-10-CM

## 2022-04-02 NOTE — Procedures (Addendum)
Objective Swallowing Evaluation: Type of Study: MBS-Modified Barium Swallow Study   Patient Details  Name: Taylor [name] MRN: [id number] Date of Birth: June 20, 1950  Today's Date: 04/02/2022 Time: SLP Start Time (ACUTE ONLY): 1050 -SLP Stop Time (ACUTE ONLY): 1110  SLP Time Calculation (min) (ACUTE ONLY): 20 min   Past Medical History:  Past Medical History:  Diagnosis Date   Anxiety    Arthritis    "hands, neck, back" (07/22/2013)   Atrial fibrillation ([place])    CHEST DISCOMFORT    Chronic back pain    "started in lower back; getting to be all over my back" (07/22/2013)   Chronic bronchitis ([place])    "I've had it 2 years in a row; do have some trouble breathing at times; use an inhaler prn; not asthma" (07/22/2013)   Chronic neck pain    COPD (chronic obstructive pulmonary disease) ([place])    DDD (degenerative disc disease), cervical    DDD (degenerative disc disease), lumbar    Depression    Dysrhythmia    GERD (gastroesophageal reflux disease)    Hepatitis 1971   "don't know if it was A or B" (07/22/2013)   HYPERLIPIDEMIA-MIXED 2005   "went away after I started taking fish oil" (07/22/2013)   Hypothyroidism    Palpitations    Pneumonia 2013   Type II diabetes mellitus ([place])    "diet controlled" (07/22/2013)   Past Surgical History:  Past Surgical History:  Procedure Laterality Date   ANTERIOR CERVICAL DECOMP/DISCECTOMY FUSION  ?2003   "w/plating" (07/22/2013)   ATRIAL FIBRILLATION ABLATION N/A 09/21/2019   Procedure: ATRIAL FIBRILLATION ABLATION;  Surgeon: Constance Haw, MD;  Location: [facility];  Service: Cardiovascular;  Laterality: N/A;   BUNIONECTOMY Left 2013   DILATION AND CURETTAGE OF UTERUS  1976   PLANTAR FASCIA SURGERY Right 2011   HPI: Pt is a 71 year old with a history of atrial fibrillation, DDD, diabetes, hypothyroidism, OSA, fibromyalgia, COPD with chronic bronchitic symptoms.  She was referred from pulmonolgy for MBS secondary to concerning  CXR and Chest CT.   Subjective: Pt was pleasant and cooperative    Recommendations for follow up therapy are one component of a multi-disciplinary discharge planning process, led by the attending physician.  Recommendations may be updated based on patient status, additional functional criteria and insurance authorization.  Assessment / Plan / Recommendation     04/02/2022   11:00 AM  Clinical Impressions  Clinical Impression Pt was seen for a modified barium swallow study and present with overall functional oropharyngeal swallowing abilities.  Trace, transient laryngeal penetration was observed with consecutive sips of thin liquid via straw and with nectar-thick liquid via cup; however, no additional laryngeal penetration was noted and no aspiration was observed with any trials.  Laryngeal penetration occurred before the swallow and was secondary to delayed swallow initiation at the level of the pyriform sinus and delayed laryngeal closure.  Of note, transient laryngeal penetration is functional for the pt's age.  Additionally, nasal pharyngeal reflux was observed x1 with consecutive sips of thin liquids.  Recommend continuation of regular solids and thin liquids with medication administered whole with liquid.  SLP Visit Diagnosis Dysphagia, oropharyngeal phase (R13.12)  Impact on safety and function Mild aspiration risk         04/02/2022   11:00 AM  Treatment Recommendations  Treatment Recommendations No treatment recommended at this time         No data to display  04/02/2022   11:00 AM  Diet Recommendations  SLP Diet Recommendations Regular solids;Thin liquid  Liquid Administration via Cup;Straw  Medication Administration Whole meds with liquid  Compensations Small sips/bites;Slow rate  Postural Changes Remain semi-upright after after feeds/meals (Comment);Seated upright at 90 degrees         04/02/2022   11:00 AM  Other Recommendations  Oral Care  Recommendations Oral care BID  Follow Up Recommendations No SLP follow up  Assistance recommended at discharge None        No data to display               04/02/2022   11:00 AM  Oral Phase  Oral Phase Impaired  Oral - Pudding Teaspoon Lingual/palatal residue  Oral - Nectar Cup Lingual/palatal residue  Oral - Thin Teaspoon Lingual/palatal residue  Oral - Thin Cup Lingual/palatal residue  Oral - Thin Straw Lingual/palatal residue  Oral - Puree Lingual/palatal residue  Oral - Regular Lingual/palatal residue       04/02/2022   11:00 AM  Pharyngeal Phase  Pharyngeal Phase Impaired  Pharyngeal- Pudding Teaspoon Reduced tongue base retraction;Delayed swallow initiation-vallecula;Pharyngeal residue - valleculae  Pharyngeal Material does not enter airway  Pharyngeal- Nectar Cup Delayed swallow initiation-pyriform sinuses;Reduced airway/laryngeal closure;Reduced tongue base retraction;Penetration/Aspiration before swallow;Pharyngeal residue - valleculae  Pharyngeal Material enters airway, remains ABOVE vocal cords then ejected out  Pharyngeal- Thin Teaspoon Delayed swallow initiation-pyriform sinuses;Pharyngeal residue - valleculae;Pharyngeal residue - pyriform;Reduced tongue base retraction  Pharyngeal Material does not enter airway  Pharyngeal- Thin Cup Delayed swallow initiation-pyriform sinuses;Reduced tongue base retraction;Pharyngeal residue - valleculae;Pharyngeal residue - pyriform  Pharyngeal Material does not enter airway  Pharyngeal- Thin Straw Delayed swallow initiation-pyriform sinuses;Reduced airway/laryngeal closure;Reduced tongue base retraction;Penetration/Aspiration before swallow;Pharyngeal residue - valleculae;Nasopharyngeal reflux  Pharyngeal Material enters airway, remains ABOVE vocal cords then ejected out  Pharyngeal- Puree Delayed swallow initiation-vallecula;Reduced tongue base retraction;Pharyngeal residue - valleculae;Pharyngeal residue - pyriform   Pharyngeal Material does not enter airway  Pharyngeal- Regular Delayed swallow initiation-vallecula  Pharyngeal Material does not enter airway         No data to display          Bretta Bang, M.S., [facility] Office: [phone]  [place] 04/02/2022, 11:59 AM

## 2022-04-14 ENCOUNTER — Other Ambulatory Visit: Payer: Self-pay | Admitting: Anesthesiology

## 2022-04-14 ENCOUNTER — Ambulatory Visit (INDEPENDENT_AMBULATORY_CARE_PROVIDER_SITE_OTHER): Payer: PPO

## 2022-04-14 DIAGNOSIS — M545 Low back pain, unspecified: Secondary | ICD-10-CM | POA: Diagnosis not present

## 2022-04-14 DIAGNOSIS — M542 Cervicalgia: Secondary | ICD-10-CM | POA: Diagnosis not present

## 2022-04-16 ENCOUNTER — Ambulatory Visit (HOSPITAL_BASED_OUTPATIENT_CLINIC_OR_DEPARTMENT_OTHER)
Admission: RE | Admit: 2022-04-16 | Discharge: 2022-04-16 | Disposition: A | Discharge status: Home / Self Care | Payer: PPO | Source: Ambulatory Visit | Attending: Emergency Medicine | Admitting: Emergency Medicine

## 2022-04-16 DIAGNOSIS — J189 Pneumonia, unspecified organism: Secondary | ICD-10-CM | POA: Insufficient documentation

## 2022-04-23 ENCOUNTER — Ambulatory Visit (INDEPENDENT_AMBULATORY_CARE_PROVIDER_SITE_OTHER): Payer: PPO | Admitting: Emergency Medicine

## 2022-04-23 ENCOUNTER — Encounter: Payer: Self-pay | Admitting: Emergency Medicine

## 2022-04-23 VITALS — BP 122/60 | HR 69 | Temp 98.4°F | Ht 66.0 in | Wt 197.4 lb

## 2022-04-23 DIAGNOSIS — J301 Allergic rhinitis due to pollen: Secondary | ICD-10-CM

## 2022-04-23 DIAGNOSIS — J449 Chronic obstructive pulmonary disease, unspecified: Secondary | ICD-10-CM | POA: Diagnosis not present

## 2022-04-23 DIAGNOSIS — J189 Pneumonia, unspecified organism: Secondary | ICD-10-CM

## 2022-04-23 DIAGNOSIS — G4733 Obstructive sleep apnea (adult) (pediatric): Secondary | ICD-10-CM

## 2022-04-23 DIAGNOSIS — K219 Gastro-esophageal reflux disease without esophagitis: Secondary | ICD-10-CM

## 2022-04-23 LAB — PULMONARY FUNCTION TEST
DL/VA % pred: 85 %
DL/VA: 3.48 ml/min/mmHg/L
DLCO cor % pred: 74 %
DLCO cor: 15.61 ml/min/mmHg
DLCO unc % pred: 74 %
DLCO unc: 15.61 ml/min/mmHg
FEF 25-75 Post: 1.67 L/sec
FEF 25-75 Pre: 1.65 L/sec
FEF2575-%Change-Post: 1 %
FEF2575-%Pred-Post: 79 %
FEF2575-%Pred-Pre: 78 %
FEV1-%Change-Post: 0 %
FEV1-%Pred-Post: 91 %
FEV1-%Pred-Pre: 92 %
FEV1-Post: 2.31 L
FEV1-Pre: 2.32 L
FEV1FVC-%Change-Post: -2 %
FEV1FVC-%Pred-Pre: 95 %
FEV6-%Change-Post: 3 %
FEV6-%Pred-Post: 101 %
FEV6-%Pred-Pre: 98 %
FEV6-Post: 3.23 L
FEV6-Pre: 3.12 L
FEV6FVC-%Change-Post: 0 %
FEV6FVC-%Pred-Post: 102 %
FEV6FVC-%Pred-Pre: 102 %
FVC-%Change-Post: 2 %
FVC-%Pred-Post: 98 %
FVC-%Pred-Pre: 96 %
FVC-Post: 3.27 L
FVC-Pre: 3.19 L
Post FEV1/FVC ratio: 71 %
Post FEV6/FVC ratio: 99 %
Pre FEV1/FVC ratio: 73 %
Pre FEV6/FVC Ratio: 98 %
RV % pred: 77 %
RV: 1.74 L
TLC % pred: 94 %
TLC: 5.05 L

## 2022-04-23 NOTE — Addendum Note (Signed)
Addended by: Vanessa Barbara on: 04/23/2022 04:58 PM   Modules accepted: Orders

## 2022-04-23 NOTE — Assessment & Plan Note (Signed)
She describes recurrent pneumonias.  Again some of these may have been acute exacerbations of COPD, or just episodes of chronic cough.  Causes of true recurrent pneumonias certainly would include recurrent aspiration/LPR.  She does have GERD.  Her barium swallow showed laryngeal penetration without overt aspiration but she is at risk.  Talked her today about avoiding straws.  I will continue her pantoprazole as ordered.  Also consider noninfectious inflammatory process such as eosinophilic pneumonia, COP, etc.  I will plan to repeat her CT chest in 6 months to ensure no evolving infiltrates.  The current CT scan is improved/stable compared with January 2023.  I do not have the CT chest from August 2023 from [place] (associated with a hospital admission).

## 2022-04-23 NOTE — Assessment & Plan Note (Signed)
Pantoprazole twice daily.  Considered decreasing but she still has breakthrough GERD.  I will continue until next visit

## 2022-04-23 NOTE — Progress Notes (Signed)
Full PFT completed today ? ?

## 2022-04-23 NOTE — Patient Instructions (Addendum)
We reviewed your pulmonary function testing and your CT scan of the chest today. We will plan to repeat your CT chest without contrast in 6 months Please continue your Wixela twice a day.  Rinse and gargle after using. Keep albuterol available to use 2 puffs if needed for shortness of breath, chest tightness, wheezing. Continue pantoprazole 40 mg twice a day.  Take this medication 1 hour around food. Try to avoid drinking through a straw Continue Zyrtec and Singulair as you have been taking them. Follow with Dr Lamonte Sakai in 6 months after your CT scan so we can review, or sooner if you have any problems

## 2022-04-23 NOTE — Assessment & Plan Note (Signed)
Mild obstruction on her pulmonary function testing today.  Certainly not to the degree expected based on her recurrent symptoms.  Plan to continue Wixela since she seems to be tolerating it without significant cough.

## 2022-04-23 NOTE — Assessment & Plan Note (Signed)
Continue Zyrtec and Singulair

## 2022-04-23 NOTE — Assessment & Plan Note (Signed)
Continue CPAP. She reports good compliance.

## 2022-04-23 NOTE — Progress Notes (Signed)
Subjective:    Patient ID: Taylor [name], female    DOB: 1951/05/19, 71 y.o.   MRN: [id number]  HPI 71 year old former smoker (40 pack years) with a history of atrial fibrillation, DDD, diabetes, hypothyroidism, OSA, fibromyalgia, COPD with chronic bronchitic symptoms.  Apparently she has a history of a lung mass that was documented in 2015.  CT chest done 06/18/2020 at [facility] reported calcified pulmonary nodular disease and noted resolution of a wedge-shaped right upper lobe opacity seen 06/16/2019.  No mention of any other mass on CT. She has been followed by Dr. Girard Cooter with pulmonary for recurrent pneumonias, infiltrates on CT chest.  Underwent bronchoscopy 05/28/2018, cultures apparently grew out Enterobacter. She notes flaring sx a few times a year - sx include chest congestion w increased cough, fatigue. Mucous thicker, yellow. No blood. She is unsure about any aspiration sx, has never been evaluated formally.  Zyrtec, singulair, she has breakthrough GERD on omeprazole '40mg'$  qhs, and '20mg'$  qam.   She was recently admitted to [facility] with symptoms consistent with a COPD exacerbation.  CT-PA was performed that ruled out PE but which reportedly showed multifocal right greater than left pulmonary infiltrates consistent with pneumonia  CT-PA 01/26/2022 [facility], report available, images not currently available.  Showed multifocal right greater than left pulmonary infiltrates with a 2.7 cm masslike density in the right lower lobe.  There was nonspecific right greater than left mediastinal and hilar lymphadenopathy    ROV 04/23/2022 --71 year old woman with COPD, chronic bronchitic symptoms and recurrent pneumonias.  I saw her in September to discuss abnormal CT scan of the chest as well as recurrent symptoms consistent with exacerbations.  Her most recent CT was done in [place] 01/26/2022.  On that film she apparently had persistence of calcified pulmonary nodular disease as well as a right  lower lobe opacity that needed follow-up to ensure clearance.  She has undergone pulmonary function testing as below, chest imaging as below. At her last visit I increased her pantoprazole to twice a day, unclear what was contributing to her recurrent flares.  Continued her on Wixela although would favor a nonpatterned alternative.  Also continue her Zyrtec and Singulair.  CT chest 04/16/2022 reviewed by me shows centrilobular emphysematous change, clustered parabronchial vascular cystic disease unchanged.  There has been an interval improvement in bilateral patchy upper lobe predominant groundglass opacity, no new opacities or infiltrates other areas of nodular scarring and atelectasis are unchanged compared to 06/26/2021.  The area of right lower lobe masslike density has apparently resolved.  Pulmonary function testing performed today and reviewed by me show mild obstruction based on cover for flow-volume loop, no bronchodilator response.  Lung volumes with possible restriction based on a decreased RV.  Her diffusion capacity is decreased but corrects to the normal range when adjusted for alveolar volume.  Barium swallow 04/02/22 shows laryngeal penetration with a straw, without overt aspiration.    Review of Systems As per HPI     Objective:   Physical Exam  Vitals:   04/23/22 1623  BP: 122/60  Pulse: 69  Temp: 98.4 F (36.9 C)  TempSrc: Oral  SpO2: 96%  Weight: 197 lb 6.4 oz (89.5 kg)  Height: '5\' 6"'$  (1.676 m)   Gen: Pleasant, overweight woman, in no distress,  normal affect  ENT: No lesions,  mouth clear,  oropharynx clear, no postnasal drip  Neck: No JVD, no stridor  Lungs: No use of accessory muscles, no crackles or wheezing on normal respiration, no wheeze  on forced expiration  Cardiovascular: RRR, heart sounds normal, no murmur or gallops, no peripheral edema  Musculoskeletal: No deformities, no cyanosis or clubbing  Neuro: alert, awake, non focal  Skin: Warm, no  lesions or rash     Assessment & Plan:   OSA (obstructive sleep apnea) Continue CPAP. She reports good compliance.   COPD (chronic obstructive pulmonary disease) Mild obstruction on her pulmonary function testing today.  Certainly not to the degree expected based on her recurrent symptoms.  Plan to continue Wixela since she seems to be tolerating it without significant cough.  Recurrent pneumonia She describes recurrent pneumonias.  Again some of these may have been acute exacerbations of COPD, or just episodes of chronic cough.  Causes of true recurrent pneumonias certainly would include recurrent aspiration/LPR.  She does have GERD.  Her barium swallow showed laryngeal penetration without overt aspiration but she is at risk.  Talked her today about avoiding straws.  I will continue her pantoprazole as ordered.  Also consider noninfectious inflammatory process such as eosinophilic pneumonia, COP, etc.  I will plan to repeat her CT chest in 6 months to ensure no evolving infiltrates.  The current CT scan is improved/stable compared with January 2023.  I do not have the CT chest from August 2023 from [place] (associated with a hospital admission).  Allergic rhinitis Continue Zyrtec and Singulair  GERD (gastroesophageal reflux disease) Pantoprazole twice daily.  Considered decreasing but she still has breakthrough GERD.  I will continue until next visit   Baltazar Apo, MD, PhD 04/23/2022, 4:55 PM [facility] [phone] or if no answer before 7:00PM call [phone] For any issues after 7:00PM please call eLink [phone]

## 2022-05-06 NOTE — Progress Notes (Signed)
Cardiology Office Note   Date:  05/07/2022   ID:  Taylor, [name] 05-07-1951, MRN [id number]  PCP:  [place], Julie M, NP  Cardiologist:   Dietrich Pates, MD   F/U of PAF    History of Present Illness: Taylor [name] is a 71 y.o. female with a history of OSA, COPD, DM hypothyroidism, GERD and PA Patient had afib initially in 2009, around time of URI.  Had intermitt after. Placed on flecanide 100 bid   In 2021 she had recurrent palpitations / afib and was seen by Carleene Mains.  She underwent ablation of afib in early 2021    After this her flecanide was stopped   With no recurrence of afib, Xarelto was also disconfinued   I saw the pt in Jan 2023   Since seen she denies palpitations   No CP   Breathing is OK, stable   Occasionally hard to catch brerath   Pt recently seen by R Byrum  Using CPAP despite no liking it   Plan for follow up in 6 months   Current Meds  Medication Sig   acetaminophen (TYLENOL) 500 MG tablet Take 1,000 mg by mouth every 6 (six) hours as needed for moderate pain or headache.    albuterol (PROVENTIL HFA;VENTOLIN HFA) 108 (90 BASE) MCG/ACT inhaler Inhale 1 puff into the lungs every 6 (six) hours as needed for wheezing or shortness of breath.   Ascorbic Acid (VITAMIN C) 1000 MG tablet Take 1,000 mg by mouth once a week.    buPROPion (WELLBUTRIN XL) 150 MG 24 hr tablet Take 300 mg by mouth daily.    cetirizine (ZYRTEC) 10 MG tablet Take 10 mg by mouth daily.    Cinnamon 500 MG TABS Take 1,000 mg by mouth daily.   empagliflozin (JARDIANCE) 10 MG TABS tablet Take 1 tablet (10 mg total) by mouth daily before breakfast.   ESTRADIOL TD Place 1 mL onto the skin daily. Estradiol 7.5mg /gm cream   FLUoxetine (PROZAC) 20 MG tablet Take 20 mg by mouth in the morning and at bedtime.   Fluticasone-Salmeterol (ADVAIR) 500-50 MCG/DOSE AEPB Inhale 1 puff into the lungs in the morning and at bedtime.    furosemide (LASIX) 40 MG tablet Take 40 mg by mouth daily.   Guaifenesin 1200  MG TB12 Take 1,200 mg by mouth daily.   hydrOXYzine (ATARAX/VISTARIL) 50 MG tablet Take 50 mg by mouth daily as needed for itching.    ketoconazole (NIZORAL) 2 % cream Apply 1 application topically daily.    metoprolol tartrate (LOPRESSOR) 25 MG tablet Take 1 tablet (25 mg total) by mouth 2 (two) times daily.   montelukast (SINGULAIR) 10 MG tablet Take 10 mg by mouth at bedtime.   OIL OF OREGANO PO Take 1-2 capsules by mouth daily as needed (cold symptoms).    Omega-3 Fatty Acids (FISH OIL) 1000 MG CAPS Take 2,000 mg by mouth daily.   OVER THE COUNTER MEDICATION Take 2 tablets by mouth daily at 6 (six) AM. SACROMYCES bouliardi (probiotic)   Oxycodone HCl 10 MG TABS Take 10 mg by mouth 5 (five) times daily as needed.   pantoprazole (PROTONIX) 40 MG tablet Take 1 tablet (40 mg total) by mouth 2 (two) times daily.   potassium chloride (KLOR-CON M10) 10 MEQ tablet Take 1 tablet (10 mEq total) by mouth daily.   pregabalin (LYRICA) 75 MG capsule Take 75 mg by mouth 3 (three) times daily.    progesterone (PROMETRIUM) 100 MG capsule  Take 300 mg by mouth every evening.    pseudoephedrine (SUDAFED) 120 MG 12 hr tablet Take 120 mg by mouth daily.   TESTOSTERONE TD Place 1-2 application onto the skin See admin instructions. Testosterone 2% cream. Apply 1 click's worth of testosterone cream on Sun, Tues, Thurs, and Sat. Apply 2 click's worth on Mon, Wed, and Fri   thyroid (ARMOUR) 60 MG tablet Take 30-120 mg by mouth See admin instructions. Take 120 mg in the morning and 30 mg in the afternoon   tiZANidine (ZANAFLEX) 4 MG tablet Take 2 mg by mouth 4 (four) times daily as needed for muscle spasms.    TURMERIC PO Take 1,440 mg by mouth every evening.   Vitamin A 2400 MCG (8000 UT) CAPS Take 2,400 mcg by mouth daily.   Vitamin D-Vitamin K (VITAMIN K2-VITAMIN D3 PO) Take 1 capsule by mouth every evening.     Allergies:   Avelox [moxifloxacin hcl in nacl], Linezolid, Venlafaxine, Rosuvastatin, Cephalexin,  Clindamycin, Nitrofurantoin, Other, Penicillins, and Statins   Past Medical History:  Diagnosis Date   Anxiety    Arthritis    "hands, neck, back" (07/22/2013)   Atrial fibrillation ([facility])    CHEST DISCOMFORT    Chronic back pain    "started in lower back; getting to be all over my back" (07/22/2013)   Chronic bronchitis ([facility])    "I've had it 2 years in a row; do have some trouble breathing at times; use an inhaler prn; not asthma" (07/22/2013)   Chronic neck pain    COPD (chronic obstructive pulmonary disease) ([facility])    DDD (degenerative disc disease), cervical    DDD (degenerative disc disease), lumbar    Depression    Dysrhythmia    GERD (gastroesophageal reflux disease)    Hepatitis 1971   "don't know if it was A or B" (07/22/2013)   HYPERLIPIDEMIA-MIXED 2005   "went away after I started taking fish oil" (07/22/2013)   Hypothyroidism    Palpitations    Pneumonia 2013   Type II diabetes mellitus ([facility])    "diet controlled" (07/22/2013)    Past Surgical History:  Procedure Laterality Date   ANTERIOR CERVICAL DECOMP/DISCECTOMY FUSION  ?2003   "w/plating" (07/22/2013)   ATRIAL FIBRILLATION ABLATION N/A 09/21/2019   Procedure: ATRIAL FIBRILLATION ABLATION;  Surgeon: Regan Lemming, MD;  Location: [facility];  Service: Cardiovascular;  Laterality: N/A;   BUNIONECTOMY Left 2013   DILATION AND CURETTAGE OF UTERUS  1976   PLANTAR FASCIA SURGERY Right 2011     Social History:  The patient  reports that she quit smoking about 14 years ago. Her smoking use included cigarettes. She has a 40.00 pack-year smoking history. She has never used smokeless tobacco. She reports that she does not currently use alcohol. She reports that she does not use drugs.   Family History:  The patient's family history includes Diabetes in her maternal grandfather; Heart attack in her paternal grandfather; Heart disease in an other family member.    ROS:  Please see the history of present illness.  All other systems are reviewed and  Negative to the above problem except as noted.    PHYSICAL EXAM: VS:  BP 124/62   Pulse 69   Ht 5\' 6"  (1.676 m)   Wt 199 lb 12.8 oz (90.6 kg)   SpO2 92%   BMI 32.25 kg/m   Gen  Obese 71 yo in no acute distress  HEENT: normal  Neck: JVP normal ,  carotid bruit Cardiac: RRR  No murmurs  No LE edema  Respiratory:  clear to auscultation biilaterally   GI: soft, nontender, nondistended, + BS  No hepatomegaly  MS: no deformity Moving all extremities   Skin: warm and dry  Rash around mouth  SOme erythema at arms   Neuro:  Grossly intact  Psych: euthymic mood, full affect   EKG:  EKG isnot ordered today  Lipid Panel    Component Value Date/Time   CHOL 210 (H) 04/25/2019 1543   TRIG 159 (H) 04/25/2019 1543   HDL 54 04/25/2019 1543   CHOLHDL 3.9 04/25/2019 1543   CHOLHDL 4.2 CALC 11/17/2007 0844   VLDL 18 11/17/2007 0844   LDLCALC 128 (H) 04/25/2019 1543      Wt Readings from Last 3 Encounters:  05/07/22 199 lb 12.8 oz (90.6 kg)  04/23/22 197 lb 6.4 oz (89.5 kg)  02/19/22 191 lb 12.8 oz (87 kg)      ASSESSMENT AND PLAN:  1  PAF Pt is s/p ablation   no clinical recurrence   Off of meds     Follow     2  PAD  Pt with mild plaquing on CT   Control risk factors   3  HL   Follow lipds  Last panel 2 years ago   Will check today     4   HTN  BP is a controlled   COntinue current regimen  Wil lcheck BMET, BNP, lipids, A1C  F/U 8 months  Current medicines are reviewed at length with the patient today.  The patient does not have concerns regarding medicines.  Signed, Dietrich Pates, MD  05/07/2022 12:27 PM    [facility] [address] [place], [place], [place]  [address] Phone: [phone]; Fax: [phone]

## 2022-05-07 ENCOUNTER — Encounter: Payer: Self-pay | Admitting: Internal Medicine

## 2022-05-07 ENCOUNTER — Ambulatory Visit: Payer: PPO | Attending: Internal Medicine | Admitting: Internal Medicine

## 2022-05-07 VITALS — BP 124/62 | HR 69 | Ht 66.0 in | Wt 199.8 lb

## 2022-05-07 DIAGNOSIS — E782 Mixed hyperlipidemia: Secondary | ICD-10-CM

## 2022-05-07 DIAGNOSIS — I5040 Unspecified combined systolic (congestive) and diastolic (congestive) heart failure: Secondary | ICD-10-CM | POA: Diagnosis not present

## 2022-05-07 DIAGNOSIS — I10 Essential (primary) hypertension: Secondary | ICD-10-CM | POA: Diagnosis not present

## 2022-05-07 DIAGNOSIS — I480 Paroxysmal atrial fibrillation: Secondary | ICD-10-CM | POA: Diagnosis not present

## 2022-05-07 DIAGNOSIS — R918 Other nonspecific abnormal finding of lung field: Secondary | ICD-10-CM | POA: Diagnosis not present

## 2022-05-07 DIAGNOSIS — Z79899 Other long term (current) drug therapy: Secondary | ICD-10-CM

## 2022-05-07 NOTE — Patient Instructions (Signed)
Medication Instructions:   *If you need a refill on your cardiac medications before your next appointment, please call your pharmacy*   Lab Work: Bmet, pro bnp, hgba1c, lipid   If you have labs (blood work) drawn today and your tests are completely normal, you will receive your results only by: [place] (if you have MyChart) OR A paper copy in the mail If you have any lab test that is abnormal or we need to change your treatment, we will call you to review the results.   Testing/Procedures:    Follow-Up: At [facility], you and your health needs are our priority.  As part of our continuing mission to provide you with exceptional heart care, we have created designated Provider Care Teams.  These Care Teams include your primary Cardiologist (physician) and Advanced Practice Providers (APPs -  Physician Assistants and Nurse Practitioners) who all work together to provide you with the care you need, when you need it.  We recommend signing up for the patient portal called "MyChart".  Sign up information is provided on this After Visit Summary.  MyChart is used to connect with patients for Virtual Visits (Telemedicine).  Patients are able to view lab/test results, encounter notes, upcoming appointments, etc.  Non-urgent messages can be sent to your provider as well.   To learn more about what you can do with MyChart, go to [email or web address].    Your next appointment:   8 month(s)  The format for your next appointment:   In Person  Provider:   Dorris Carnes, MD     Other Instructions   Important Information About Sugar

## 2022-05-08 LAB — BASIC METABOLIC PANEL
BUN/Creatinine Ratio: 23 (ref 12–28)
BUN: 24 mg/dL (ref 8–27)
CO2: 24 mmol/L (ref 20–29)
Calcium: 9.3 mg/dL (ref 8.7–10.3)
Chloride: 101 mmol/L (ref 96–106)
Creatinine, Ser: 1.05 mg/dL — ABNORMAL HIGH (ref 0.57–1.00)
Glucose: 93 mg/dL (ref 70–99)
Potassium: 4.2 mmol/L (ref 3.5–5.2)
Sodium: 138 mmol/L (ref 134–144)
eGFR: 58 mL/min/{1.73_m2} — ABNORMAL LOW (ref >59)

## 2022-05-08 LAB — LIPID PANEL
Chol/HDL Ratio: 3.3 ratio (ref 0.0–4.4)
Cholesterol, Total: 192 mg/dL (ref 100–199)
HDL: 59 mg/dL (ref >39)
LDL Chol Calc (NIH): 119 mg/dL — ABNORMAL HIGH (ref 0–99)
Triglycerides: 75 mg/dL (ref 0–149)
VLDL Cholesterol Cal: 14 mg/dL (ref 5–40)

## 2022-05-08 LAB — HEMOGLOBIN A1C
Est. average glucose Bld gHb Est-mCnc: 111 mg/dL
Hgb A1c MFr Bld: 5.5 % (ref 4.8–5.6)

## 2022-05-08 LAB — PRO B NATRIURETIC PEPTIDE: NT-Pro BNP: 587 pg/mL — ABNORMAL HIGH (ref 0–301)

## 2022-05-09 ENCOUNTER — Telehealth: Payer: Self-pay | Admitting: Emergency Medicine

## 2022-05-09 ENCOUNTER — Telehealth: Payer: Self-pay

## 2022-05-09 ENCOUNTER — Encounter: Payer: Self-pay | Admitting: Emergency Medicine

## 2022-05-09 DIAGNOSIS — E782 Mixed hyperlipidemia: Secondary | ICD-10-CM

## 2022-05-09 DIAGNOSIS — Z79899 Other long term (current) drug therapy: Secondary | ICD-10-CM

## 2022-05-09 MED ORDER — EZETIMIBE 10 MG PO TABS: 10.0000 mg | ORAL_TABLET | Freq: Every day | ORAL | 3 refills | Status: DC

## 2022-05-09 NOTE — Telephone Encounter (Signed)
I think she l;ikely needs to be seen w a CXR - would arrange next week w APP if possible.   Please ask her to start doxycycline '100mg'$  bid x 7 days  If she is worsening before we can see her then she needs to go to [facility] or ED

## 2022-05-09 NOTE — Telephone Encounter (Signed)
-----   Message from [place], MD sent at 05/08/2022 12:50 PM EST ----- LDL is 119   It should be lower with atherosclerosis on aorta I would try Zetia to start  She is intolerant to statins    Follow up lipomed and liver panel in 8 wks

## 2022-05-09 NOTE — Telephone Encounter (Signed)
Pt called the office stating that she feels like she is getting pna again.  Pt said that she has had a cough and congestion in her chest. States that when she woke up this morning 12/1, she woke up feeling dizzy.  Pt said she has gained about 2 pounds within the last 4 days.  Pt states when she coughs, she is coughing up yellow phlegm. Pt said she has some wheezing going on as well.  Pt denies any complaints of fever.  Due to her symptoms, pt wants to know what could be recommended to help her out.  Dr. Lamonte Sakai, please advise.

## 2022-05-09 NOTE — Telephone Encounter (Signed)
The patient has been notified of the result and verbalized understanding.  All questions were answered and she agrees to try the Zetia.   She will have labs drawn early Feb 2024 at the [facility] in [place].

## 2022-05-09 NOTE — Telephone Encounter (Signed)
Called and spoke with patient. I was able to get her scheduled for 05/16/22 at 3pm with Beth (this was the soonest appt with an APP). She verbalized understanding of doxy Rx.   Nothing further needed at time of call.

## 2022-05-10 ENCOUNTER — Other Ambulatory Visit: Payer: Self-pay

## 2022-05-10 ENCOUNTER — Emergency Department (HOSPITAL_BASED_OUTPATIENT_CLINIC_OR_DEPARTMENT_OTHER): Payer: PPO

## 2022-05-10 ENCOUNTER — Encounter: Payer: Self-pay | Admitting: Family Medicine

## 2022-05-10 ENCOUNTER — Encounter (HOSPITAL_BASED_OUTPATIENT_CLINIC_OR_DEPARTMENT_OTHER): Payer: Self-pay | Admitting: Emergency Medicine

## 2022-05-10 ENCOUNTER — Encounter (HOSPITAL_COMMUNITY): Payer: Self-pay

## 2022-05-10 ENCOUNTER — Ambulatory Visit
Admission: EM | Admit: 2022-05-10 | Discharge: 2022-05-10 | Disposition: A | Discharge status: Home / Self Care | Payer: PPO

## 2022-05-10 ENCOUNTER — Inpatient Hospital Stay (HOSPITAL_BASED_OUTPATIENT_CLINIC_OR_DEPARTMENT_OTHER)
Admission: EM | Admit: 2022-05-10 | Discharge: 2022-05-14 | DRG: 193 | Disposition: A | Discharge status: Home / Self Care | Payer: PPO | Attending: Internal Medicine | Admitting: Internal Medicine

## 2022-05-10 DIAGNOSIS — I10 Essential (primary) hypertension: Secondary | ICD-10-CM | POA: Diagnosis present

## 2022-05-10 DIAGNOSIS — E876 Hypokalemia: Secondary | ICD-10-CM | POA: Diagnosis present

## 2022-05-10 DIAGNOSIS — R0602 Shortness of breath: Secondary | ICD-10-CM | POA: Diagnosis not present

## 2022-05-10 DIAGNOSIS — I110 Hypertensive heart disease with heart failure: Secondary | ICD-10-CM | POA: Diagnosis present

## 2022-05-10 DIAGNOSIS — Z9989 Dependence on other enabling machines and devices: Secondary | ICD-10-CM

## 2022-05-10 DIAGNOSIS — Z888 Allergy status to other drugs, medicaments and biological substances status: Secondary | ICD-10-CM

## 2022-05-10 DIAGNOSIS — J90 Pleural effusion, not elsewhere classified: Secondary | ICD-10-CM | POA: Diagnosis present

## 2022-05-10 DIAGNOSIS — Z7951 Long term (current) use of inhaled steroids: Secondary | ICD-10-CM

## 2022-05-10 DIAGNOSIS — Z87891 Personal history of nicotine dependence: Secondary | ICD-10-CM

## 2022-05-10 DIAGNOSIS — E119 Type 2 diabetes mellitus without complications: Secondary | ICD-10-CM | POA: Diagnosis present

## 2022-05-10 DIAGNOSIS — F419 Anxiety disorder, unspecified: Secondary | ICD-10-CM | POA: Diagnosis present

## 2022-05-10 DIAGNOSIS — Z8701 Personal history of pneumonia (recurrent): Secondary | ICD-10-CM

## 2022-05-10 DIAGNOSIS — Z7984 Long term (current) use of oral hypoglycemic drugs: Secondary | ICD-10-CM | POA: Diagnosis not present

## 2022-05-10 DIAGNOSIS — Z833 Family history of diabetes mellitus: Secondary | ICD-10-CM

## 2022-05-10 DIAGNOSIS — K219 Gastro-esophageal reflux disease without esophagitis: Secondary | ICD-10-CM | POA: Diagnosis present

## 2022-05-10 DIAGNOSIS — Z880 Allergy status to penicillin: Secondary | ICD-10-CM

## 2022-05-10 DIAGNOSIS — Z6832 Body mass index (BMI) 32.0-32.9, adult: Secondary | ICD-10-CM

## 2022-05-10 DIAGNOSIS — E785 Hyperlipidemia, unspecified: Secondary | ICD-10-CM | POA: Diagnosis present

## 2022-05-10 DIAGNOSIS — J440 Chronic obstructive pulmonary disease with acute lower respiratory infection: Secondary | ICD-10-CM | POA: Diagnosis present

## 2022-05-10 DIAGNOSIS — M4800 Spinal stenosis, site unspecified: Secondary | ICD-10-CM | POA: Diagnosis present

## 2022-05-10 DIAGNOSIS — F32A Depression, unspecified: Secondary | ICD-10-CM | POA: Diagnosis present

## 2022-05-10 DIAGNOSIS — E039 Hypothyroidism, unspecified: Secondary | ICD-10-CM | POA: Diagnosis present

## 2022-05-10 DIAGNOSIS — M797 Fibromyalgia: Secondary | ICD-10-CM | POA: Diagnosis present

## 2022-05-10 DIAGNOSIS — J9601 Acute respiratory failure with hypoxia: Secondary | ICD-10-CM | POA: Diagnosis present

## 2022-05-10 DIAGNOSIS — G4733 Obstructive sleep apnea (adult) (pediatric): Secondary | ICD-10-CM | POA: Diagnosis present

## 2022-05-10 DIAGNOSIS — J441 Chronic obstructive pulmonary disease with (acute) exacerbation: Secondary | ICD-10-CM | POA: Diagnosis present

## 2022-05-10 DIAGNOSIS — I480 Paroxysmal atrial fibrillation: Secondary | ICD-10-CM

## 2022-05-10 DIAGNOSIS — Z1152 Encounter for screening for COVID-19: Secondary | ICD-10-CM

## 2022-05-10 DIAGNOSIS — J189 Pneumonia, unspecified organism: Secondary | ICD-10-CM | POA: Diagnosis not present

## 2022-05-10 DIAGNOSIS — G8929 Other chronic pain: Secondary | ICD-10-CM | POA: Diagnosis present

## 2022-05-10 DIAGNOSIS — Z8249 Family history of ischemic heart disease and other diseases of the circulatory system: Secondary | ICD-10-CM

## 2022-05-10 DIAGNOSIS — I5033 Acute on chronic diastolic (congestive) heart failure: Secondary | ICD-10-CM | POA: Diagnosis present

## 2022-05-10 DIAGNOSIS — E669 Obesity, unspecified: Secondary | ICD-10-CM | POA: Diagnosis present

## 2022-05-10 DIAGNOSIS — Z79890 Hormone replacement therapy (postmenopausal): Secondary | ICD-10-CM | POA: Diagnosis not present

## 2022-05-10 DIAGNOSIS — Z79899 Other long term (current) drug therapy: Secondary | ICD-10-CM

## 2022-05-10 DIAGNOSIS — Z881 Allergy status to other antibiotic agents status: Secondary | ICD-10-CM

## 2022-05-10 DIAGNOSIS — R0902 Hypoxemia: Principal | ICD-10-CM

## 2022-05-10 LAB — CBC WITH DIFFERENTIAL/PLATELET
Abs Immature Granulocytes: 0.17 10*3/uL — ABNORMAL HIGH (ref 0.00–0.07)
Basophils Absolute: 0.1 10*3/uL (ref 0.0–0.1)
Basophils Relative: 0 %
Eosinophils Absolute: 0.2 10*3/uL (ref 0.0–0.5)
Eosinophils Relative: 1 %
HCT: 39.1 % (ref 36.0–46.0)
Hemoglobin: 12.5 g/dL (ref 12.0–15.0)
Immature Granulocytes: 1 %
Lymphocytes Relative: 5 %
Lymphs Abs: 0.9 10*3/uL (ref 0.7–4.0)
MCH: 30 pg (ref 26.0–34.0)
MCHC: 32 g/dL (ref 30.0–36.0)
MCV: 94 fL (ref 80.0–100.0)
Monocytes Absolute: 1.2 10*3/uL — ABNORMAL HIGH (ref 0.1–1.0)
Monocytes Relative: 7 %
Neutro Abs: 13.9 10*3/uL — ABNORMAL HIGH (ref 1.7–7.7)
Neutrophils Relative %: 86 %
Platelets: 193 10*3/uL (ref 150–400)
RBC: 4.16 MIL/uL (ref 3.87–5.11)
RDW: 13.5 % (ref 11.5–15.5)
WBC: 16.4 10*3/uL — ABNORMAL HIGH (ref 4.0–10.5)
nRBC: 0 % (ref 0.0–0.2)

## 2022-05-10 LAB — RESPIRATORY PANEL BY PCR

## 2022-05-10 LAB — BASIC METABOLIC PANEL
Anion gap: 10 (ref 5–15)
BUN: 20 mg/dL (ref 8–23)
CO2: 23 mmol/L (ref 22–32)
Calcium: 8.6 mg/dL — ABNORMAL LOW (ref 8.9–10.3)
Chloride: 101 mmol/L (ref 98–111)
Creatinine, Ser: 0.96 mg/dL (ref 0.44–1.00)
GFR, Estimated: >60 mL/min (ref >60)
Glucose, Bld: 93 mg/dL (ref 70–99)
Potassium: 3.4 mmol/L — ABNORMAL LOW (ref 3.5–5.1)
Sodium: 134 mmol/L — ABNORMAL LOW (ref 135–145)

## 2022-05-10 LAB — RESP PANEL BY RT-PCR (FLU A&B, COVID) ARPGX2
Influenza A by PCR: NEGATIVE
Influenza B by PCR: NEGATIVE
SARS Coronavirus 2 by RT PCR: NEGATIVE

## 2022-05-10 LAB — PROCALCITONIN: Procalcitonin: 2.15 ng/mL

## 2022-05-10 LAB — TROPONIN I (HIGH SENSITIVITY)
Troponin I (High Sensitivity): 8 ng/L (ref <18)
Troponin I (High Sensitivity): 8 ng/L (ref <18)

## 2022-05-10 LAB — BRAIN NATRIURETIC PEPTIDE: B Natriuretic Peptide: 292.9 pg/mL — ABNORMAL HIGH (ref 0.0–100.0)

## 2022-05-10 MED ORDER — HYDROXYZINE HCL 50 MG PO TABS
50.0000 mg | ORAL_TABLET | Freq: Every day | ORAL | Status: DC | PRN
  Administered 2022-05-11 – 2022-05-13 (×3): 50 mg via ORAL
  Filled 2022-05-10 (×3): qty 1

## 2022-05-10 MED ORDER — EZETIMIBE 10 MG PO TABS
10.0000 mg | ORAL_TABLET | Freq: Every day | ORAL | Status: DC
  Administered 2022-05-11 – 2022-05-14 (×4): 10 mg via ORAL
  Filled 2022-05-10 (×4): qty 1

## 2022-05-10 MED ORDER — FUROSEMIDE 10 MG/ML IJ SOLN
40.0000 mg | Freq: Once | INTRAMUSCULAR | Status: AC
  Administered 2022-05-10: 40 mg via INTRAVENOUS
  Filled 2022-05-10: qty 4

## 2022-05-10 MED ORDER — FLUOXETINE HCL 20 MG PO CAPS
20.0000 mg | ORAL_CAPSULE | Freq: Every day | ORAL | Status: DC
  Administered 2022-05-11 – 2022-05-14 (×4): 20 mg via ORAL
  Filled 2022-05-10 (×4): qty 1

## 2022-05-10 MED ORDER — MONTELUKAST SODIUM 10 MG PO TABS
10.0000 mg | ORAL_TABLET | Freq: Every day | ORAL | Status: DC
  Administered 2022-05-10 – 2022-05-13 (×4): 10 mg via ORAL
  Filled 2022-05-10 (×3): qty 1

## 2022-05-10 MED ORDER — LEVALBUTEROL HCL 0.63 MG/3ML IN NEBU: 0.6300 mg | INHALATION_SOLUTION | RESPIRATORY_TRACT | Status: DC | PRN

## 2022-05-10 MED ORDER — ENOXAPARIN SODIUM 40 MG/0.4ML IJ SOSY
40.0000 mg | PREFILLED_SYRINGE | INTRAMUSCULAR | Status: DC
  Administered 2022-05-10 – 2022-05-13 (×4): 40 mg via SUBCUTANEOUS
  Filled 2022-05-10 (×4): qty 0.4

## 2022-05-10 MED ORDER — IPRATROPIUM BROMIDE 0.02 % IN SOLN
0.5000 mg | Freq: Three times a day (TID) | RESPIRATORY_TRACT | Status: DC
  Administered 2022-05-11 – 2022-05-14 (×11): 0.5 mg via RESPIRATORY_TRACT
  Filled 2022-05-10 (×11): qty 2.5

## 2022-05-10 MED ORDER — SODIUM CHLORIDE 0.9 % IV SOLN
2.0000 g | Freq: Three times a day (TID) | INTRAVENOUS | Status: DC
  Administered 2022-05-10 – 2022-05-14 (×12): 2 g via INTRAVENOUS
  Filled 2022-05-10 (×13): qty 12.5

## 2022-05-10 MED ORDER — THYROID 30 MG PO TABS: 30.0000 mg | ORAL_TABLET | ORAL | Status: DC

## 2022-05-10 MED ORDER — OXYCODONE HCL 5 MG PO TABS
10.0000 mg | ORAL_TABLET | Freq: Four times a day (QID) | ORAL | Status: DC | PRN
  Administered 2022-05-10 – 2022-05-14 (×13): 10 mg via ORAL
  Filled 2022-05-10 (×13): qty 2

## 2022-05-10 MED ORDER — BUPROPION HCL ER (XL) 300 MG PO TB24
300.0000 mg | ORAL_TABLET | Freq: Every day | ORAL | Status: DC
  Administered 2022-05-11 – 2022-05-14 (×4): 300 mg via ORAL
  Filled 2022-05-10 (×4): qty 1

## 2022-05-10 MED ORDER — IPRATROPIUM-ALBUTEROL 0.5-2.5 (3) MG/3ML IN SOLN: 3.0000 mL | RESPIRATORY_TRACT | Status: DC | PRN

## 2022-05-10 MED ORDER — SODIUM CHLORIDE 0.9 % IV SOLN
500.0000 mg | INTRAVENOUS | Status: DC
  Administered 2022-05-10: 500 mg via INTRAVENOUS
  Filled 2022-05-10: qty 5

## 2022-05-10 MED ORDER — CALCIUM CARBONATE ANTACID 500 MG PO CHEW: 2.0000 | CHEWABLE_TABLET | Freq: Three times a day (TID) | ORAL | Status: DC | PRN

## 2022-05-10 MED ORDER — METOPROLOL TARTRATE 5 MG/5ML IV SOLN: 5.0000 mg | Freq: Once | INTRAVENOUS | Status: DC

## 2022-05-10 MED ORDER — LORATADINE 10 MG PO TABS
10.0000 mg | ORAL_TABLET | Freq: Every day | ORAL | Status: DC
  Administered 2022-05-11 – 2022-05-14 (×4): 10 mg via ORAL
  Filled 2022-05-10 (×4): qty 1

## 2022-05-10 MED ORDER — ACETAMINOPHEN 650 MG RE SUPP: 650.0000 mg | Freq: Four times a day (QID) | RECTAL | Status: DC | PRN

## 2022-05-10 MED ORDER — SODIUM CHLORIDE 0.9 % IV SOLN: 1.0000 g | INTRAVENOUS | Status: DC

## 2022-05-10 MED ORDER — FUROSEMIDE 40 MG PO TABS
40.0000 mg | ORAL_TABLET | Freq: Every day | ORAL | Status: DC
  Administered 2022-05-11: 40 mg via ORAL
  Filled 2022-05-10: qty 1

## 2022-05-10 MED ORDER — IPRATROPIUM BROMIDE 0.02 % IN SOLN
0.5000 mg | Freq: Four times a day (QID) | RESPIRATORY_TRACT | Status: DC
  Administered 2022-05-10: 0.5 mg via RESPIRATORY_TRACT
  Filled 2022-05-10: qty 2.5

## 2022-05-10 MED ORDER — PROGESTERONE MICRONIZED 100 MG PO CAPS: 300.0000 mg | ORAL_CAPSULE | Freq: Every evening | ORAL | Status: DC

## 2022-05-10 MED ORDER — ACETAMINOPHEN 325 MG PO TABS
650.0000 mg | ORAL_TABLET | Freq: Four times a day (QID) | ORAL | Status: DC | PRN
  Administered 2022-05-11 – 2022-05-12 (×2): 650 mg via ORAL
  Filled 2022-05-10 (×2): qty 2

## 2022-05-10 MED ORDER — SODIUM CHLORIDE 0.9 % IV SOLN
100.0000 mg | Freq: Once | INTRAVENOUS | Status: AC
  Administered 2022-05-10: 100 mg via INTRAVENOUS
  Filled 2022-05-10: qty 100

## 2022-05-10 MED ORDER — IOHEXOL 300 MG/ML  SOLN
80.0000 mL | Freq: Once | INTRAMUSCULAR | Status: AC | PRN
  Administered 2022-05-10: 80 mL via INTRAVENOUS

## 2022-05-10 MED ORDER — IPRATROPIUM BROMIDE 0.02 % IN SOLN: 0.5000 mg | Freq: Four times a day (QID) | RESPIRATORY_TRACT | Status: DC

## 2022-05-10 MED ORDER — METOPROLOL TARTRATE 25 MG PO TABS
25.0000 mg | ORAL_TABLET | Freq: Two times a day (BID) | ORAL | Status: DC
  Administered 2022-05-10 – 2022-05-14 (×8): 25 mg via ORAL
  Filled 2022-05-10 (×8): qty 1

## 2022-05-10 MED ORDER — PREGABALIN 75 MG PO CAPS
75.0000 mg | ORAL_CAPSULE | Freq: Three times a day (TID) | ORAL | Status: DC
  Administered 2022-05-10 – 2022-05-14 (×11): 75 mg via ORAL
  Filled 2022-05-10 (×12): qty 1

## 2022-05-10 MED ORDER — THYROID 60 MG PO TABS: 60.0000 mg | ORAL_TABLET | ORAL | Status: DC

## 2022-05-10 MED ORDER — GUAIFENESIN 100 MG/5ML PO LIQD
5.0000 mL | ORAL | Status: DC | PRN
  Administered 2022-05-11 – 2022-05-13 (×5): 5 mL via ORAL
  Filled 2022-05-10 (×5): qty 10

## 2022-05-10 MED ORDER — LEVALBUTEROL HCL 0.63 MG/3ML IN NEBU
0.6300 mg | INHALATION_SOLUTION | Freq: Four times a day (QID) | RESPIRATORY_TRACT | Status: DC
  Administered 2022-05-10: 0.63 mg via RESPIRATORY_TRACT
  Filled 2022-05-10: qty 3

## 2022-05-10 MED ORDER — METHYLPREDNISOLONE SODIUM SUCC 125 MG IJ SOLR
125.0000 mg | Freq: Once | INTRAMUSCULAR | Status: AC
  Administered 2022-05-10: 125 mg via INTRAVENOUS
  Filled 2022-05-10: qty 2

## 2022-05-10 MED ORDER — MONTELUKAST SODIUM 10 MG PO TABS
10.0000 mg | ORAL_TABLET | Freq: Every day | ORAL | Status: DC
  Filled 2022-05-10: qty 1

## 2022-05-10 MED ORDER — DILTIAZEM HCL 25 MG/5ML IV SOLN
10.0000 mg | Freq: Once | INTRAVENOUS | Status: AC
  Administered 2022-05-10: 10 mg via INTRAVENOUS
  Filled 2022-05-10: qty 5

## 2022-05-10 MED ORDER — METHYLPREDNISOLONE SODIUM SUCC 40 MG IJ SOLR
40.0000 mg | Freq: Two times a day (BID) | INTRAMUSCULAR | Status: DC
  Administered 2022-05-11 (×2): 40 mg via INTRAVENOUS
  Filled 2022-05-10 (×2): qty 1

## 2022-05-10 MED ORDER — METHYLPREDNISOLONE SODIUM SUCC 125 MG IJ SOLR
60.0000 mg | Freq: Two times a day (BID) | INTRAMUSCULAR | Status: DC
  Administered 2022-05-10: 60 mg via INTRAVENOUS
  Filled 2022-05-10: qty 2

## 2022-05-10 MED ORDER — LEVALBUTEROL HCL 0.63 MG/3ML IN NEBU: 0.6300 mg | INHALATION_SOLUTION | Freq: Three times a day (TID) | RESPIRATORY_TRACT | Status: DC

## 2022-05-10 MED ORDER — LEVALBUTEROL HCL 0.63 MG/3ML IN NEBU
0.6300 mg | INHALATION_SOLUTION | Freq: Three times a day (TID) | RESPIRATORY_TRACT | Status: DC
  Administered 2022-05-11 – 2022-05-14 (×11): 0.63 mg via RESPIRATORY_TRACT
  Filled 2022-05-10 (×11): qty 3

## 2022-05-10 NOTE — ED Notes (Signed)
Late entry for 1150 EMS staff in dispute w/ M. Ragan, FNP in regard to taking pt to [place]. She states she will have to contact her Freight forwarder.

## 2022-05-10 NOTE — Progress Notes (Signed)
Pharmacy Antibiotic Note  Taylor [name] is a 71 y.o. female admitted on 05/10/2022 with pneumonia.  Pharmacy has been consulted for Cefepime dosing because pt has tolerated Cefepime in February 2015, however, pt reports rash and itching to cephalexin in the past (dates unknown).  WBC 16.4, Tmax 99.8 F SCr 0.96 (baseline Scr ~1)  Plan: Start Cefepime 2g IV q8h Continue azithromycin '500mg'$  IV q24h per MD  Watch SCr closely and adjust cefepime dosing if needed Monitor daily CBC, temp, SCr, and for clinical signs of improvement  F/u cultures and de-escalate antibiotics as able   Height: '5\' 6"'$  (167.6 cm) Weight: 91.2 kg (201 lb) IBW/kg (Calculated) : 59.3  Temp (24hrs), Avg:98.6 F (37 C), Min:97.4 F (36.3 C), Max:99.8 F (37.7 C)  Recent Labs  Lab 05/07/22 1217 05/10/22 1307  WBC  --  16.4*  CREATININE 1.05* 0.96    Estimated Creatinine Clearance: 63 mL/min (by C-G formula based on SCr of 0.96 mg/dL).    Allergies  Allergen Reactions   Avelox [Moxifloxacin Hcl In Nacl] Hives   Linezolid Rash   Venlafaxine Nausea Only and Rash   Rosuvastatin Other (See Comments)    myalgias   Cephalexin Itching and Rash   Clindamycin Diarrhea   Nitrofurantoin Itching and Rash   Other Rash    Brewer yeast and Bakers yeast causes cracks in mouth stomach bloating    Penicillins Itching and Rash    Has patient had a PCN reaction causing anaphylaxis, immediate rash, facial/tongue/throat swelling, SOB or lightheadedness with hypotension? no Has patient had a PCN reaction causing severe rash involving mucus membranes or skin necrosis?  no Has patient had a PCN reaction that required hospitilization?no  Has patient had a PCN reaction occurring within the last 10 years?  no If all of the above answers are "no" then may proceed with cephalosporin use.   Statins Other (See Comments)    Muscle pain    Antimicrobials this admission: Cefepime 12/2 >>  Azithromycin 12/2 >>   Dose adjustments  this admission: N/A  Microbiology results: pending  Thank you for allowing pharmacy to be a part of this patient's care.  Luisa Hart, PharmD, BCPS Clinical Pharmacist 05/10/2022 4:19 PM   Please refer to AMION for pharmacy phone number

## 2022-05-10 NOTE — Discharge Instructions (Addendum)
EMS arrived at 11:55 AM somewhat unsure whether they can take patient to [facility] ED EMT onsite said that she needed to see how many trucks were available before she made the decision outside at her truck.  Patient advised to go to [facility] now for further evaluation of EMS and please do request EMS to take.

## 2022-05-10 NOTE — ED Notes (Signed)
Pt a/o, VS

## 2022-05-10 NOTE — ED Notes (Signed)
EMS arrives to get pt. VSS, 94% O2 sat on 2 LPM via [place]. Pt a/o.

## 2022-05-10 NOTE — ED Notes (Signed)
Note after pt d/c: After finding out pt was transported by personal vehicle to [place] due to EMS not taking her there (as per order of M. Enis Gash, FNP), report called to ER RN at [place].

## 2022-05-10 NOTE — ED Triage Notes (Signed)
Pt presents to Urgent Care with c/o cough and sob x approx 1 week. Reports her O2 sat has been in the 70's since yesterday.

## 2022-05-10 NOTE — ED Triage Notes (Signed)
Pt arrives pov, referred by UC, c/o shob and cough x 1 week. Reports low sats in 70's at  home. States 83% at [facility]. Pt sats 78% on RA in triage. Endorses decr urine output

## 2022-05-10 NOTE — H&P (Addendum)
PCP:   [place], Julie M, NP   Chief Complaint:    HPI: This is a 71 year old female with past medical history of anxiety and depression, diabetes mellitus, fibromyalgia, spinal stenosis, chronic pain, COPD, recurrent pneumonia, and atrial fibrillation.  Per patient her shortness of breath started yesterday.  She is wheezing and some cough.  Her pulse ox yesterday fluctuated between 72 to 82%.  She went to sleep woke up this morning, her sats was 76%.  As the day progressed became more more short of breath.  She has a nebulizer but did not use it.  She is not on home oxygen.  She denies fever, chills, nausea or vomiting.  She reports some episodes of lightheadedness approximate 1 hour prior to coming to the ER.  While in the ER she was noted to be in A-fib with RVR, EKG shows rate of 180.  CT chest with contrast shows consolidations with increased pulmonary interstitium identified in bilateral upper lobes, bilateral lower lobes with relative sparing of the right middle lobe. Findings are consistent with multifocal pneumoni.  In the ER patient on 4 L oxygen satting 92%.  Patient also reports a 5 pound weight gain in last 3 days.  History provided by patient is alert and oriented.  Review of Systems:  The patient denies anorexia, fever, weight loss,, vision loss, decreased hearing, hoarseness, chest pain, syncope, dyspnea on exertion, peripheral edema, balance deficits, hemoptysis, abdominal pain, melena, hematochezia, severe indigestion/heartburn, hematuria, incontinence, genital sores, muscle weakness, suspicious skin lesions, transient blindness, difficulty walking, depression, unusual weight change, abnormal bleeding, enlarged lymph nodes, angioedema, and breast masses. Positives: Shortness of breath, wheeze, lightheadedness, cough, weight gain  Past Medical History: Past Medical History:  Diagnosis Date   Anxiety    Arthritis    "hands, neck, back" (07/22/2013)   Atrial fibrillation (HCC)     CHEST DISCOMFORT    Chronic back pain    "started in lower back; getting to be all over my back" (07/22/2013)   Chronic bronchitis ([place])    "I've had it 2 years in a row; do have some trouble breathing at times; use an inhaler prn; not asthma" (07/22/2013)   Chronic neck pain    COPD (chronic obstructive pulmonary disease) (HCC)    DDD (degenerative disc disease), cervical    DDD (degenerative disc disease), lumbar    Depression    Dysrhythmia    GERD (gastroesophageal reflux disease)    Hepatitis 1971   "don't know if it was A or B" (07/22/2013)   HYPERLIPIDEMIA-MIXED 2005   "went away after I started taking fish oil" (07/22/2013)   Hypothyroidism    Palpitations    Pneumonia 2013   Type II diabetes mellitus ([place])    "diet controlled" (07/22/2013)   Past Surgical History:  Procedure Laterality Date   ANTERIOR CERVICAL DECOMP/DISCECTOMY FUSION  ?2003   "w/plating" (07/22/2013)   ATRIAL FIBRILLATION ABLATION N/A 09/21/2019   Procedure: ATRIAL FIBRILLATION ABLATION;  Surgeon: Constance Haw, MD;  Location: [facility];  Service: Cardiovascular;  Laterality: N/A;   BUNIONECTOMY Left 2013   DILATION AND CURETTAGE OF UTERUS  1976   PLANTAR FASCIA SURGERY Right 2011    Medications: Prior to Admission medications   Medication Sig Start Date End Date Taking? Authorizing Provider  ezetimibe (ZETIA) 10 MG tablet Take 1 tablet (10 mg total) by mouth daily. 05/09/22   Fay Records, MD  acetaminophen (TYLENOL) 500 MG tablet Take 1,000 mg by mouth every 6 (  six) hours as needed for moderate pain or headache.     [provider]  albuterol (PROVENTIL HFA;VENTOLIN HFA) 108 (90 BASE) MCG/ACT inhaler Inhale 1 puff into the lungs every 6 (six) hours as needed for wheezing or shortness of breath. 08/01/13   Kinnie Feil, MD  Ascorbic Acid (VITAMIN C) 1000 MG tablet Take 1,000 mg by mouth once a week.  11/15/16   [provider]  buPROPion (WELLBUTRIN XL) 150 MG 24 hr  tablet Take 300 mg by mouth daily.  11/19/11   [provider]  cetirizine (ZYRTEC) 10 MG tablet Take 10 mg by mouth daily.     [provider]  Cinnamon 500 MG TABS Take 1,000 mg by mouth daily.    [provider]  empagliflozin (JARDIANCE) 10 MG TABS tablet Take 1 tablet (10 mg total) by mouth daily before breakfast. 02/05/22   Elgie Collard, PA-C  ESTRADIOL TD Place 1 mL onto the skin daily. Estradiol 7.'5mg'$ /gm cream    [provider]  FLUoxetine (PROZAC) 20 MG tablet Take 20 mg by mouth in the morning and at bedtime.    [provider]  Fluticasone-Salmeterol (ADVAIR) 500-50 MCG/DOSE AEPB Inhale 1 puff into the lungs in the morning and at bedtime.  06/30/18   [provider]  furosemide (LASIX) 40 MG tablet Take 40 mg by mouth daily.    [provider]  Guaifenesin 1200 MG TB12 Take 1,200 mg by mouth daily.    [provider]  hydrOXYzine (ATARAX/VISTARIL) 50 MG tablet Take 50 mg by mouth daily as needed for itching.     [provider]  ketoconazole (NIZORAL) 2 % cream Apply 1 application topically daily.  03/26/18   [provider]  metoprolol tartrate (LOPRESSOR) 25 MG tablet Take 1 tablet (25 mg total) by mouth 2 (two) times daily. 11/20/21   Fay Records, MD  montelukast (SINGULAIR) 10 MG tablet Take 10 mg by mouth at bedtime.    [provider]  OIL OF OREGANO PO Take 1-2 capsules by mouth daily as needed (cold symptoms).     [provider]  Omega-3 Fatty Acids (FISH OIL) 1000 MG CAPS Take 2,000 mg by mouth daily.    [provider]  omeprazole (PRILOSEC) 20 MG capsule Take 20 mg by mouth daily.  Patient not taking: Reported on 05/07/2022    [provider]  OVER THE COUNTER MEDICATION Take 2 tablets by mouth daily at 6 (six) AM. SACROMYCES bouliardi (probiotic)    [provider]  Oxycodone HCl 10 MG TABS Take 10 mg by mouth 5 (five) times daily as needed.  04/10/22   [provider]  pantoprazole (PROTONIX) 40 MG tablet Take 1 tablet (40 mg total) by mouth 2 (two) times daily. 02/19/22   Collene Gobble, MD  potassium chloride (KLOR-CON M10) 10 MEQ tablet Take 1 tablet (10 mEq total) by mouth daily. 12/14/18   Fay Records, MD  pregabalin (LYRICA) 75 MG capsule Take 75 mg by mouth 3 (three) times daily.     [provider]  progesterone (PROMETRIUM) 100 MG capsule Take 300 mg by mouth every evening.  11/03/11   [provider]  pseudoephedrine (SUDAFED) 120 MG 12 hr tablet Take 120 mg by mouth daily.    [provider]  TESTOSTERONE TD Place 1-2 application onto the skin See admin instructions. Testosterone 2% cream. Apply 1 click's worth of testosterone cream on Sun, Tues, Thurs, and  Sat. Apply 2 click's worth on Mon, Wed, and Fri    [provider]  thyroid (ARMOUR) 60 MG tablet Take 30-120 mg by mouth See admin instructions. Take 120 mg in the morning and 30 mg in the afternoon    [provider]  tiZANidine (ZANAFLEX) 4 MG tablet Take 2 mg by mouth 4 (four) times daily as needed for muscle spasms.     [provider]  TURMERIC PO Take 1,440 mg by mouth every evening.    [provider]  Vitamin A 2400 MCG (8000 UT) CAPS Take 2,400 mcg by mouth daily.    [provider]  Vitamin D-Vitamin K (VITAMIN K2-VITAMIN D3 PO) Take 1 capsule by mouth every evening.    [provider]    Allergies:   Allergies  Allergen Reactions   Avelox [Moxifloxacin Hcl In Nacl] Hives   Linezolid Rash   Venlafaxine Nausea Only and Rash   Rosuvastatin Other (See Comments)    myalgias   Cephalexin Itching and Rash   Clindamycin Diarrhea   Nitrofurantoin Itching and Rash   Other Rash    Brewer yeast and Bakers yeast causes cracks in mouth stomach bloating    Penicillins Itching and Rash    Has patient had a PCN reaction causing anaphylaxis, immediate rash, facial/tongue/throat  swelling, SOB or lightheadedness with hypotension? no Has patient had a PCN reaction causing severe rash involving mucus membranes or skin necrosis?  no Has patient had a PCN reaction that required hospitilization?no  Has patient had a PCN reaction occurring within the last 10 years?  no If all of the above answers are "no" then may proceed with cephalosporin use.   Statins Other (See Comments)    Muscle pain    Social History:  reports that she quit smoking about 14 years ago. Her smoking use included cigarettes. She has a 40.00 pack-year smoking history. She has never used smokeless tobacco. She reports that she does not currently use alcohol. She reports that she does not use drugs.  Family History: Family History  Problem Relation Age of Onset   Heart disease Other    Diabetes Maternal Grandfather    Heart attack Paternal Grandfather     Physical Exam: Vitals:   05/10/22 1930 05/10/22 1945 05/10/22 2040 05/10/22 2145  BP: 119/64 (!) 113/57  (!) 111/52  Pulse: 87 86  85  Resp: (!) 22 (!) 25  18  Temp:   98.2 F (36.8 C) 98.9 F (37.2 C)  TempSrc:    Oral  SpO2: 95% 94%  91%  Weight:      Height:        General:  Alert and oriented times three, well developed and nourished, no acute distress Eyes: PERRLA, pink conjunctiva, no scleral icterus ENT: Moist oral mucosa, neck supple, no thyromegaly Lungs: clear to ascultation, no wheeze, some right-sided rhonchi right, no use of accessory muscles Cardiovascular: regular rate and rhythm, no regurgitation, no gallops, no murmurs. No carotid bruits, no JVD Abdomen: soft, positive BS, non-tender, non-distended, no organomegaly, not an acute abdomen GU: not examined Neuro: CN II - XII grossly intact, sensation intact Musculoskeletal: strength 5/5 all extremities, no clubbing, cyanosis or edema Skin: no rash, no subcutaneous crepitation, no decubitus Psych: appropriate patient   Labs on Admission:  Recent Labs     05/10/22 1307  NA 134*  K 3.4*  CL 101  CO2 23  GLUCOSE 93  BUN 20  CREATININE 0.96  CALCIUM 8.6*  Recent Labs    05/10/22 1307  WBC 16.4*  NEUTROABS 13.9*  HGB 12.5  HCT 39.1  MCV 94.0  PLT 193    Micro Results: Recent Results (from the past 240 hour(s))  Resp Panel by RT-PCR (Flu A&B, Covid) Anterior Nasal Swab     Status: None   Collection Time: 05/10/22  1:04 PM   Specimen: Anterior Nasal Swab  Result Value Ref Range Status   SARS Coronavirus 2 by RT PCR NEGATIVE NEGATIVE Final    Comment: (NOTE) SARS-CoV-2 target nucleic acids are NOT DETECTED.  The SARS-CoV-2 RNA is generally detectable in upper respiratory specimens during the acute phase of infection. The lowest concentration of SARS-CoV-2 viral copies this assay can detect is 138 copies/mL. A negative result does not preclude SARS-Cov-2 infection and should not be used as the sole basis for treatment or other patient management decisions. A negative result may occur with  improper specimen collection/handling, submission of specimen other than nasopharyngeal swab, presence of viral mutation(s) within the areas targeted by this assay, and inadequate number of viral copies(<138 copies/mL). A negative result must be combined with clinical observations, patient history, and epidemiological information. The expected result is Negative.  Fact Sheet for Patients:  EntrepreneurPulse.com.au  Fact Sheet for Healthcare Providers:  IncredibleEmployment.be  This test is no t yet approved or cleared by the [place] FDA and  has been authorized for detection and/or diagnosis of SARS-CoV-2 by FDA under an Emergency Use Authorization (EUA). This EUA will remain  in effect (meaning this test can be used) for the duration of the COVID-19 declaration under Section 564(b)(1) of the Act, 21 U.S.C.section 360bbb-3(b)(1), unless the authorization is terminated  or revoked sooner.        Influenza A by PCR NEGATIVE NEGATIVE Final   Influenza B by PCR NEGATIVE NEGATIVE Final    Comment: (NOTE) The Xpert Xpress SARS-CoV-2/FLU/RSV plus assay is intended as an aid in the diagnosis of influenza from Nasopharyngeal swab specimens and should not be used as a sole basis for treatment. Nasal washings and aspirates are unacceptable for Xpert Xpress SARS-CoV-2/FLU/RSV testing.  Fact Sheet for Patients: EntrepreneurPulse.com.au  Fact Sheet for Healthcare Providers: IncredibleEmployment.be  This test is not yet approved or cleared by the [place] FDA and has been authorized for detection and/or diagnosis of SARS-CoV-2 by FDA under an Emergency Use Authorization (EUA). This EUA will remain in effect (meaning this test can be used) for the duration of the COVID-19 declaration under Section 564(b)(1) of the Act, 21 U.S.C. section 360bbb-3(b)(1), unless the authorization is terminated or revoked.  Performed at [facility], [address] [place], [place] [address]   Respiratory (~20 pathogens) panel by PCR     Status: None   Collection Time: 05/10/22  4:01 PM   Specimen: Nasopharyngeal Swab; Respiratory  Result Value Ref Range Status   Adenovirus NOT DETECTED NOT DETECTED Final   Coronavirus 229E NOT DETECTED NOT DETECTED Final    Comment: (NOTE) The Coronavirus on the Respiratory Panel, DOES NOT test for the novel  Coronavirus (2019 nCoV)    Coronavirus HKU1 NOT DETECTED NOT DETECTED Final   Coronavirus NL63 NOT DETECTED NOT DETECTED Final   Coronavirus OC43 NOT DETECTED NOT DETECTED Final   Metapneumovirus NOT DETECTED NOT DETECTED Final   Rhinovirus / Enterovirus NOT DETECTED NOT DETECTED Final   Influenza A NOT DETECTED NOT DETECTED Final   Influenza B NOT DETECTED NOT DETECTED Final   Parainfluenza Virus 1  NOT DETECTED NOT DETECTED Final   Parainfluenza Virus 2 NOT DETECTED NOT DETECTED Final    Parainfluenza Virus 3 NOT DETECTED NOT DETECTED Final   Parainfluenza Virus 4 NOT DETECTED NOT DETECTED Final   Respiratory Syncytial Virus NOT DETECTED NOT DETECTED Final   Bordetella pertussis NOT DETECTED NOT DETECTED Final   Bordetella Parapertussis NOT DETECTED NOT DETECTED Final   Chlamydophila pneumoniae NOT DETECTED NOT DETECTED Final   Mycoplasma pneumoniae NOT DETECTED NOT DETECTED Final    Comment: Performed at [facility], [place] [address] [place], [place] [address]     Radiological Exams on Admission: CT Chest W Contrast  Result Date: 05/10/2022 CLINICAL DATA:  Respiratory illness. Cough and shortness of breath for 1 week. EXAM: CT CHEST WITH CONTRAST TECHNIQUE: Multidetector CT imaging of the chest was performed during intravenous contrast administration. RADIATION DOSE REDUCTION: This exam was performed according to the departmental dose-optimization program which includes automated exposure control, adjustment of the mA and/or kV according to patient size and/or use of iterative reconstruction technique. CONTRAST:  60m OMNIPAQUE IOHEXOL 300 MG/ML  SOLN COMPARISON:  April 16, 2022 FINDINGS: Cardiovascular: No significant vascular findings. Heart size is mildly enlarged. No pericardial effusion. Mediastinum/Nodes: Mediastinal and hilar lymphadenopathy, favor reactive lymph nodes. Trachea, esophagus and thyroid glands are unremarkable. Lungs/Pleura: Consolidations with increased pulmonary interstitium identified in bilateral upper lobes, bilateral lower lobes with relative sparing of the right middle lobe. 4 mm focal peripheral nodule in the right middle lobe unchanged. Small right pleural effusion. Upper Abdomen: No acute abnormality. 10.2 cm simple cyst of left kidney is in size, incompletely included. Musculoskeletal: Degenerative joint changes of the spine are noted. IMPRESSION: 1. Consolidations with increased pulmonary interstitium identified in bilateral upper lobes,  bilateral lower lobes with relative sparing of the right middle lobe. Findings are consistent with multifocal pneumonia. 2. Small right pleural effusion. 3. Mediastinal and hilar lymphadenopathy, favor reactive lymph nodes. 4. 10.2 cm simple cyst of left kidney, incompletely included. Electronically Signed   By: WAbelardo DieselM.D.   On: 05/10/2022 14:54   DG Chest Portable 1 View  Result Date: 05/10/2022 CLINICAL DATA:  Shortness of breath, cough EXAM: PORTABLE CHEST 1 VIEW COMPARISON:  Previous studies including chest radiograph done on 01/21/2022 and CT chest done on 04/16/2022 FINDINGS: Transverse diameter of heart is increased. Central pulmonary vessels are prominent. Increased interstitial and alveolar markings are seen in right upper lung fields and both lower lung fields. There is elevation of minor fissure on the right side suggesting decreased volume in right upper lobe. There is no significant pleural effusion. There is no pneumothorax. IMPRESSION: Cardiomegaly. Infiltrates are seen in right upper lobe and both lower lung fields suggesting multifocal pneumonia. Part of this finding may suggest underlying scarring or pulmonary edema. Electronically Signed   By: PElmer PickerM.D.   On: 05/10/2022 13:09    Assessment/Plan Present on Admission:  Acute respiratory failure with hypoxia (HCC)  CAP (community acquired pneumonia), recurrent  Acute exacerbation of chronic obstructive pulmonary disease (COPD) [place] -Admit to [facility] -Pneumonia order set initiated -Blood cultures x 2 -IV cefepime, pharmacy to dose -Oxygen to keep sats greater than 88%, respiratory consulted on board -Nebulizers as needed -Singulair and Zyrtec resumed   Atrial fibrillation with RVR -Patient with history of A-fib with RVR after URI.  Ablated in 2021.  Has had no further recurrence. -Currently back in sinus rhythm -Currently on metoprolol 25 mg p.o. twice daily, will start increases patient back in  normal  sinus rhythm with borderline blood pressures.  Will hydrate, if needed will increase to 50 mg p.o. twice daily -CHA2DS2-VASc score: 3 (age, sex, HTN) -2D Echo, Lovenox therapeutic dosing -Consult cards in AM -Lopressor IV PRN  Pulmonary nodule - 4 mm focal peripheral nodule in the right middle lobe unchanged.  Noted on CT chest in 2015   HTN (hypertension) -Continue metoprolol, 25 mg p.o. twice daily  Dyslipidemia -States she resumed  Anxiety and depression -Stable, Wellbutrin, Prozac resumed  GERD -Stable, Protonix resumed  Chronic pain -Continue Lyrica, p.o. Norco. PRN -As needed Flexeril resumed  Hypothyroidism -Stable, thyroid disease   OSA (obstructive sleep apnea) -CPaP ordered   Taylor [name] 05/10/2022, 10:06 PM

## 2022-05-10 NOTE — ED Notes (Signed)
[facility] at bedside 

## 2022-05-10 NOTE — ED Notes (Signed)
Patient is being discharged from the Urgent Care and sent to the Emergency Department via EMS . Per M. Ragan, FNP, patient is in need of higher level of care due to unstable O2 sat. Patient is aware and verbalizes understanding of plan of care.  Vitals:   05/10/22 1145 05/10/22 1150  BP:    Pulse:  81  Resp: (!) 24 (!) 24  Temp:    SpO2: 92% 94%

## 2022-05-10 NOTE — ED Notes (Signed)
EDP Schlossman made aware of pts elevated HR fluctuating from 70's to 150's.  EDP to bedside to assess pt.

## 2022-05-10 NOTE — ED Notes (Signed)
See VS. Kathi Ludwig, FNP notified and comes to room. Pt placed on O2 at 2 LPM per orders and EMS called by CMA.

## 2022-05-10 NOTE — ED Provider Notes (Signed)
[facility]PART[facility] Provider Note   CSN: [id number] Arrival date & time: 05/10/22  1228     History  Chief Complaint  Patient presents with   Shortness of Breath    Taylor [name] is a 71 y.o. female.  Patient is a 71 year old female with past medical history of hyperlipidemia, type 2 diabetes, and COPD presenting for complaints of shortness of breath.  Patient was seen in urgent care prior to arrival with a oxygen saturation of 70% on room air.  Patient does not wear home oxygen.  She admits to shortness of breath and cough x1 week.  Oxygen on arrival 78%.  Patient placed on 2 L and improved to 97%.  Patient also admits to subjective fevers.  Denies any chest pain at this time.  The history is provided by the patient. No language interpreter was used.  Shortness of Breath Associated symptoms: cough   Associated symptoms: no abdominal pain, no chest pain, no ear pain, no fever, no rash, no sore throat and no vomiting        Home Medications Prior to Admission medications   Medication Sig Start Date End Date Taking? Authorizing Provider  ezetimibe (ZETIA) 10 MG tablet Take 1 tablet (10 mg total) by mouth daily. 05/09/22   Fay Records, MD  acetaminophen (TYLENOL) 500 MG tablet Take 1,000 mg by mouth every 6 (six) hours as needed for moderate pain or headache.     [provider]  albuterol (PROVENTIL HFA;VENTOLIN HFA) 108 (90 BASE) MCG/ACT inhaler Inhale 1 puff into the lungs every 6 (six) hours as needed for wheezing or shortness of breath. 08/01/13   Kinnie Feil, MD  Ascorbic Acid (VITAMIN C) 1000 MG tablet Take 1,000 mg by mouth once a week.  11/15/16   [provider]  buPROPion (WELLBUTRIN XL) 150 MG 24 hr tablet Take 300 mg by mouth daily.  11/19/11   [provider]  cetirizine (ZYRTEC) 10 MG tablet Take 10 mg by mouth daily.     [provider]  Cinnamon 500 MG TABS Take 1,000 mg by mouth daily.    [provider]  empagliflozin (JARDIANCE) 10 MG TABS tablet Take 1 tablet (10 mg total) by mouth daily before breakfast. 02/05/22   Elgie Collard, PA-C  ESTRADIOL TD Place 1 mL onto the skin daily. Estradiol 7.'5mg'$ /gm cream    [provider]  FLUoxetine (PROZAC) 20 MG tablet Take 20 mg by mouth in the morning and at bedtime.    [provider]  Fluticasone-Salmeterol (ADVAIR) 500-50 MCG/DOSE AEPB Inhale 1 puff into the lungs in the morning and at bedtime.  06/30/18   [provider]  furosemide (LASIX) 40 MG tablet Take 40 mg by mouth daily.    [provider]  Guaifenesin 1200 MG TB12 Take 1,200 mg by mouth daily.    [provider]  hydrOXYzine (ATARAX/VISTARIL) 50 MG tablet Take 50 mg by mouth daily as needed for itching.     [provider]  ketoconazole (NIZORAL) 2 % cream Apply 1 application topically daily.  03/26/18   [provider]  metoprolol tartrate (LOPRESSOR) 25 MG tablet Take 1 tablet (25 mg total) by mouth 2 (two) times daily. 11/20/21   Fay Records, MD  montelukast (SINGULAIR) 10 MG tablet Take 10 mg by mouth at bedtime.    [provider]  OIL OF OREGANO PO Take 1-2 capsules by mouth daily as needed (cold symptoms).  [provider]  Omega-3 Fatty Acids (FISH OIL) 1000 MG CAPS Take 2,000 mg by mouth daily.    [provider]  omeprazole (PRILOSEC) 20 MG capsule Take 20 mg by mouth daily.  Patient not taking: Reported on 05/07/2022    [provider]  OVER THE COUNTER MEDICATION Take 2 tablets by mouth daily at 6 (six) AM. SACROMYCES bouliardi (probiotic)    [provider]  Oxycodone HCl 10 MG TABS Take 10 mg by mouth 5 (five) times daily as needed. 04/10/22   [provider]  pantoprazole (PROTONIX) 40 MG tablet Take 1 tablet (40 mg total) by mouth 2 (two) times daily. 02/19/22   Collene Gobble, MD  potassium chloride (KLOR-CON M10) 10 MEQ tablet Take 1 tablet  (10 mEq total) by mouth daily. 12/14/18   Fay Records, MD  pregabalin (LYRICA) 75 MG capsule Take 75 mg by mouth 3 (three) times daily.     [provider]  progesterone (PROMETRIUM) 100 MG capsule Take 300 mg by mouth every evening.  11/03/11   [provider]  pseudoephedrine (SUDAFED) 120 MG 12 hr tablet Take 120 mg by mouth daily.    [provider]  TESTOSTERONE TD Place 1-2 application onto the skin See admin instructions. Testosterone 2% cream. Apply 1 click's worth of testosterone cream on Sun, Tues, Thurs, and Sat. Apply 2 click's worth on Mon, Wed, and Fri    [provider]  thyroid (ARMOUR) 60 MG tablet Take 30-120 mg by mouth See admin instructions. Take 120 mg in the morning and 30 mg in the afternoon    [provider]  tiZANidine (ZANAFLEX) 4 MG tablet Take 2 mg by mouth 4 (four) times daily as needed for muscle spasms.     [provider]  TURMERIC PO Take 1,440 mg by mouth every evening.    [provider]  Vitamin A 2400 MCG (8000 UT) CAPS Take 2,400 mcg by mouth daily.    [provider]  Vitamin D-Vitamin K (VITAMIN K2-VITAMIN D3 PO) Take 1 capsule by mouth every evening.    [provider]      Allergies    Avelox [moxifloxacin hcl in nacl], Linezolid, Venlafaxine, Rosuvastatin, Cephalexin, Clindamycin, Nitrofurantoin, Other, Penicillins, and Statins    Review of Systems   Review of Systems  Constitutional:  Negative for chills and fever.  HENT:  Negative for ear pain and sore throat.   Eyes:  Negative for pain and visual disturbance.  Respiratory:  Positive for cough and shortness of breath.   Cardiovascular:  Negative for chest pain and palpitations.  Gastrointestinal:  Negative for abdominal pain and vomiting.  Genitourinary:  Negative for dysuria and hematuria.  Musculoskeletal:  Negative for arthralgias and back pain.  Skin:  Negative for color change and rash.  Neurological:  Negative  for seizures and syncope.  All other systems reviewed and are negative.   Physical Exam Updated Vital Signs BP 129/64   Pulse 80   Temp 99.8 F (37.7 C) (Oral)   Resp 17   Ht '5\' 6"'$  (1.676 m)   Wt 91.2 kg   SpO2 98%   BMI 32.44 kg/m  Physical Exam Vitals and nursing note reviewed.  Constitutional:      General: She is not in acute distress.    Appearance: She is well-developed.  HENT:     Head: Normocephalic and atraumatic.  Eyes:     Conjunctiva/sclera: Conjunctivae normal.  Cardiovascular:  Rate and Rhythm: Normal rate and regular rhythm.     Heart sounds: No murmur heard. Pulmonary:     Effort: Tachypnea and respiratory distress present.     Breath sounds: Examination of the right-upper field reveals wheezing. Examination of the left-upper field reveals wheezing. Examination of the right-middle field reveals wheezing. Examination of the left-middle field reveals wheezing. Examination of the right-lower field reveals wheezing. Examination of the left-lower field reveals wheezing. Wheezing present.  Abdominal:     Palpations: Abdomen is soft.     Tenderness: There is no abdominal tenderness.  Musculoskeletal:        General: No swelling.     Cervical back: Neck supple.  Skin:    General: Skin is warm and dry.     Capillary Refill: Capillary refill takes less than 2 seconds.  Neurological:     Mental Status: She is alert.  Psychiatric:        Mood and Affect: Mood normal.     ED Results / Procedures / Treatments   Labs (all labs ordered are listed, but only abnormal results are displayed) Labs Reviewed  CBC WITH DIFFERENTIAL/PLATELET - Abnormal; Notable for the following components:      Result Value   WBC 16.4 (*)    Neutro Abs 13.9 (*)    Monocytes Absolute 1.2 (*)    Abs Immature Granulocytes 0.17 (*)    All other components within normal limits  BASIC METABOLIC PANEL - Abnormal; Notable for the following components:   Sodium 134 (*)    Potassium 3.4  (*)    Calcium 8.6 (*)    All other components within normal limits  BRAIN NATRIURETIC PEPTIDE - Abnormal; Notable for the following components:   B Natriuretic Peptide 292.9 (*)    All other components within normal limits  RESP PANEL BY RT-PCR (FLU A&B, COVID) ARPGX2  RESPIRATORY PANEL BY PCR  PROCALCITONIN  TROPONIN I (HIGH SENSITIVITY)  TROPONIN I (HIGH SENSITIVITY)    EKG None  Radiology CT Chest W Contrast  Result Date: 05/10/2022 CLINICAL DATA:  Respiratory illness. Cough and shortness of breath for 1 week. EXAM: CT CHEST WITH CONTRAST TECHNIQUE: Multidetector CT imaging of the chest was performed during intravenous contrast administration. RADIATION DOSE REDUCTION: This exam was performed according to the departmental dose-optimization program which includes automated exposure control, adjustment of the mA and/or kV according to patient size and/or use of iterative reconstruction technique. CONTRAST:  17m OMNIPAQUE IOHEXOL 300 MG/ML  SOLN COMPARISON:  April 16, 2022 FINDINGS: Cardiovascular: No significant vascular findings. Heart size is mildly enlarged. No pericardial effusion. Mediastinum/Nodes: Mediastinal and hilar lymphadenopathy, favor reactive lymph nodes. Trachea, esophagus and thyroid glands are unremarkable. Lungs/Pleura: Consolidations with increased pulmonary interstitium identified in bilateral upper lobes, bilateral lower lobes with relative sparing of the right middle lobe. 4 mm focal peripheral nodule in the right middle lobe unchanged. Small right pleural effusion. Upper Abdomen: No acute abnormality. 10.2 cm simple cyst of left kidney is in size, incompletely included. Musculoskeletal: Degenerative joint changes of the spine are noted. IMPRESSION: 1. Consolidations with increased pulmonary interstitium identified in bilateral upper lobes, bilateral lower lobes with relative sparing of the right middle lobe. Findings are consistent with multifocal pneumonia. 2. Small  right pleural effusion. 3. Mediastinal and hilar lymphadenopathy, favor reactive lymph nodes. 4. 10.2 cm simple cyst of left kidney, incompletely included. Electronically Signed   By: WAbelardo DieselM.D.   On: 05/10/2022 14:54   DG Chest Portable 1 View  Result Date: 05/10/2022 CLINICAL DATA:  Shortness of breath, cough EXAM: PORTABLE CHEST 1 VIEW COMPARISON:  Previous studies including chest radiograph done on 01/21/2022 and CT chest done on 04/16/2022 FINDINGS: Transverse diameter of heart is increased. Central pulmonary vessels are prominent. Increased interstitial and alveolar markings are seen in right upper lung fields and both lower lung fields. There is elevation of minor fissure on the right side suggesting decreased volume in right upper lobe. There is no significant pleural effusion. There is no pneumothorax. IMPRESSION: Cardiomegaly. Infiltrates are seen in right upper lobe and both lower lung fields suggesting multifocal pneumonia. Part of this finding may suggest underlying scarring or pulmonary edema. Electronically Signed   By: Elmer Picker M.D.   On: 05/10/2022 13:09    Procedures Procedures    Medications Ordered in ED Medications  ipratropium-albuterol (DUONEB) 0.5-2.5 (3) MG/3ML nebulizer solution 3 mL (has no administration in time range)  doxycycline (VIBRAMYCIN) 100 mg in sodium chloride 0.9 % 250 mL IVPB (100 mg Intravenous New Bag/Given 05/10/22 1512)  furosemide (LASIX) injection 40 mg (has no administration in time range)  methylPREDNISolone sodium succinate (SOLU-MEDROL) 125 mg/2 mL injection 125 mg (125 mg Intravenous Given 05/10/22 1339)  iohexol (OMNIPAQUE) 300 MG/ML solution 80 mL (80 mLs Intravenous Contrast Given 05/10/22 1430)    ED Course/ Medical Decision Making/ A&P                           Medical Decision Making Amount and/or Complexity of Data Reviewed Labs: ordered. Radiology: ordered.  Risk Prescription drug management. Decision regarding  hospitalization.   71 year old female with past medical history of COPD presenting for complaints of shortness of breath and cough x1 week.  Patient hypoxic at urgent care at 70% prior to arrival.  39 on arrival in ED.  Patient placed on nasal cannula.  Tachypnea improved.  Low-grade temperature of 99.8 F.  Chest x-ray demonstrates possible opacities versus pulmonary edema.  Patient does also have a history of CHF and has had a 5 pound weight gain in the last week.  CT chest with IV contrast ordered.  Elder Love studies as interpreted by myself concerning for leukocytosis of 16.4 with neutrophilia of 13.9.  CT chest demonstrates bilateral pulmonary opacities concerning for pneumonia as well as right-sided lateral effusion.  Doxycycline IV, ordered for both COPD exacerbation and pneumonia.  Patient given Solu-Medrol and DuoNeb breathing treatment.  Patient given Lasix for fluid overloaded state.  Recommended for admission for hypoxia secondary to COPD exacerbation from pneumonia.  Patient agreeable plan.  I spoke admitting physician Dr. Dwyane Luo agrees to accept patient.        Final Clinical Impression(s) / ED Diagnoses Final diagnoses:  Hypoxia  COPD exacerbation ([place])  Pneumonia of both lungs due to infectious organism, unspecified part of lung  Pleural effusion    Rx / DC Orders ED Discharge Orders     None         Lianne Cure, DO 76/81/15 1544

## 2022-05-10 NOTE — ED Notes (Signed)
Patient transported to CT 

## 2022-05-10 NOTE — ED Notes (Signed)
Went to update vitals, PT in CT. Will update when PT returns.

## 2022-05-10 NOTE — ED Provider Notes (Signed)
[name]    CSN: [id number] Arrival date & time: 05/10/22  1115      History   Chief Complaint Chief Complaint  Patient presents with   Shortness of Breath    HPI Taylor [name] is a 71 y.o. female.   HPI 71 year old female presents to shortness of breath for 1 week patient reports that her O2 sats at home yesterday were in the 79s.  Patient reports that PCPs office told her to go either to urgent care or emergency department for further evaluation, patient presents here this morning.  PMH significant for atrial fibrillation, COPD, previous history of pneumonia, and DDD of cervical and lumbar regions.  SpO2 at triage 79% on room air.  Past Medical History:  Diagnosis Date   Anxiety    Arthritis    "hands, neck, back" (07/22/2013)   Atrial fibrillation ([place])    CHEST DISCOMFORT    Chronic back pain    "started in lower back; getting to be all over my back" (07/22/2013)   Chronic bronchitis ([place])    "I've had it 2 years in a row; do have some trouble breathing at times; use an inhaler prn; not asthma" (07/22/2013)   Chronic neck pain    COPD (chronic obstructive pulmonary disease) ([facility])    DDD (degenerative disc disease), cervical    DDD (degenerative disc disease), lumbar    Depression    Dysrhythmia    GERD (gastroesophageal reflux disease)    Hepatitis 1971   "don't know if it was A or B" (07/22/2013)   HYPERLIPIDEMIA-MIXED 2005   "went away after I started taking fish oil" (07/22/2013)   Hypothyroidism    Palpitations    Pneumonia 2013   Type II diabetes mellitus ([place])    "diet controlled" (07/22/2013)    Patient Active Problem List   Diagnosis Date Noted   Allergic rhinitis 02/19/2022   OSA (obstructive sleep apnea) 02/19/2022   GERD (gastroesophageal reflux disease) 02/19/2022   Secondary hypercoagulable state ([place]) 10/19/2019   Pleuritis 07/25/2013   COPD (chronic obstructive pulmonary disease) ([place]) 07/24/2013   Tobacco use 07/24/2013    Recurrent pneumonia 07/24/2013   Lung mass 07/24/2013   Pleural effusion 07/24/2013   UTI (urinary tract infection) 07/22/2013   Weakness 07/22/2013   HTN (hypertension) 07/22/2013   Leukocytosis 07/22/2013   Hyponatremia 07/22/2013   Acute renal failure ([place]) 07/22/2013   Atrial fibrillation ([place]) 08/06/2008    Past Surgical History:  Procedure Laterality Date   ANTERIOR CERVICAL DECOMP/DISCECTOMY FUSION  ?2003   "w/plating" (07/22/2013)   ATRIAL FIBRILLATION ABLATION N/A 09/21/2019   Procedure: ATRIAL FIBRILLATION ABLATION;  Surgeon: Constance Haw, MD;  Location: [facility];  Service: Cardiovascular;  Laterality: N/A;   BUNIONECTOMY Left 2013   DILATION AND CURETTAGE OF UTERUS  1976   PLANTAR FASCIA SURGERY Right 2011    OB History   No obstetric history on file.      Home Medications    Prior to Admission medications   Medication Sig Start Date End Date Taking? Authorizing Provider  ezetimibe (ZETIA) 10 MG tablet Take 1 tablet (10 mg total) by mouth daily. 05/09/22   Fay Records, MD  acetaminophen (TYLENOL) 500 MG tablet Take 1,000 mg by mouth every 6 (six) hours as needed for moderate pain or headache.     [provider]  albuterol (PROVENTIL HFA;VENTOLIN HFA) 108 (90 BASE) MCG/ACT inhaler Inhale 1 puff into the lungs every 6 (six) hours as  needed for wheezing or shortness of breath. 08/01/13   Kinnie Feil, MD  Ascorbic Acid (VITAMIN C) 1000 MG tablet Take 1,000 mg by mouth once a week.  11/15/16   [provider]  buPROPion (WELLBUTRIN XL) 150 MG 24 hr tablet Take 300 mg by mouth daily.  11/19/11   [provider]  cetirizine (ZYRTEC) 10 MG tablet Take 10 mg by mouth daily.     [provider]  Cinnamon 500 MG TABS Take 1,000 mg by mouth daily.    [provider]  empagliflozin (JARDIANCE) 10 MG TABS tablet Take 1 tablet (10 mg total) by mouth daily before breakfast. 02/05/22   Elgie Collard, PA-C  ESTRADIOL  TD Place 1 mL onto the skin daily. Estradiol 7.'5mg'$ /gm cream    [provider]  FLUoxetine (PROZAC) 20 MG tablet Take 20 mg by mouth in the morning and at bedtime.    [provider]  Fluticasone-Salmeterol (ADVAIR) 500-50 MCG/DOSE AEPB Inhale 1 puff into the lungs in the morning and at bedtime.  06/30/18   [provider]  furosemide (LASIX) 40 MG tablet Take 40 mg by mouth daily.    [provider]  Guaifenesin 1200 MG TB12 Take 1,200 mg by mouth daily.    [provider]  hydrOXYzine (ATARAX/VISTARIL) 50 MG tablet Take 50 mg by mouth daily as needed for itching.     [provider]  ketoconazole (NIZORAL) 2 % cream Apply 1 application topically daily.  03/26/18   [provider]  metoprolol tartrate (LOPRESSOR) 25 MG tablet Take 1 tablet (25 mg total) by mouth 2 (two) times daily. 11/20/21   Fay Records, MD  montelukast (SINGULAIR) 10 MG tablet Take 10 mg by mouth at bedtime.    [provider]  OIL OF OREGANO PO Take 1-2 capsules by mouth daily as needed (cold symptoms).     [provider]  Omega-3 Fatty Acids (FISH OIL) 1000 MG CAPS Take 2,000 mg by mouth daily.    [provider]  omeprazole (PRILOSEC) 20 MG capsule Take 20 mg by mouth daily.  Patient not taking: Reported on 05/07/2022    [provider]  OVER THE COUNTER MEDICATION Take 2 tablets by mouth daily at 6 (six) AM. SACROMYCES bouliardi (probiotic)    [provider]  Oxycodone HCl 10 MG TABS Take 10 mg by mouth 5 (five) times daily as needed. 04/10/22   [provider]  pantoprazole (PROTONIX) 40 MG tablet Take 1 tablet (40 mg total) by mouth 2 (two) times daily. 02/19/22   Collene Gobble, MD  potassium chloride (KLOR-CON M10) 10 MEQ tablet Take 1 tablet (10 mEq total) by mouth daily. 12/14/18   Fay Records, MD  pregabalin (LYRICA) 75 MG capsule Take 75 mg by mouth 3 (three) times daily.     [provider]   progesterone (PROMETRIUM) 100 MG capsule Take 300 mg by mouth every evening.  11/03/11   [provider]  pseudoephedrine (SUDAFED) 120 MG 12 hr tablet Take 120 mg by mouth daily.    [provider]  TESTOSTERONE TD Place 1-2 application onto the skin See admin instructions. Testosterone 2% cream. Apply 1 click's worth of testosterone cream on Sun, Tues, Thurs, and Sat. Apply 2 click's worth on Mon, Wed, and Fri    [provider]  thyroid (ARMOUR) 60 MG tablet Take 30-120 mg by mouth See admin instructions. Take 120 mg in the morning and  30 mg in the afternoon    [provider]  tiZANidine (ZANAFLEX) 4 MG tablet Take 2 mg by mouth 4 (four) times daily as needed for muscle spasms.     [provider]  TURMERIC PO Take 1,440 mg by mouth every evening.    [provider]  Vitamin A 2400 MCG (8000 UT) CAPS Take 2,400 mcg by mouth daily.    [provider]  Vitamin D-Vitamin K (VITAMIN K2-VITAMIN D3 PO) Take 1 capsule by mouth every evening.    [provider]    Family History Family History  Problem Relation Age of Onset   Heart disease Other    Diabetes Maternal Grandfather    Heart attack Paternal Grandfather     Social History Social History   Tobacco Use   Smoking status: Former    Packs/day: 1.00    Years: 40.00    Total pack years: 40.00    Types: Cigarettes    Quit date: 12/22/2007    Years since quitting: 14.3   Smokeless tobacco: Never  Substance Use Topics   Alcohol use: Not Currently    Comment: 07/22/2013 "last drink was in ~ 2005"   Drug use: No     Allergies   Avelox [moxifloxacin hcl in nacl], Linezolid, Venlafaxine, Rosuvastatin, Cephalexin, Clindamycin, Nitrofurantoin, Other, Penicillins, and Statins   Review of Systems Review of Systems  Respiratory:  Positive for shortness of breath.   All other systems reviewed and are negative.    Physical Exam Triage Vital Signs ED Triage  Vitals [05/10/22 1137]  Enc Vitals Group     BP 110/69     Pulse Rate 78     Resp (!) 28     Temp (!) 97.4 F (36.3 C)     Temp Source Tympanic     SpO2 (!) 79 %     Weight      Height      Head Circumference      Peak Flow      Pain Score      Pain Loc      Pain Edu?      Excl. in [place]?    No data found.  Updated Vital Signs BP 110/69   Pulse 81   Temp (!) 97.4 F (36.3 C) (Tympanic)   Resp (!) 24   SpO2 94%      Physical Exam Vitals and nursing note reviewed.  Constitutional:      Appearance: She is well-developed. She is obese. She is ill-appearing.  HENT:     Head: Normocephalic and atraumatic.     Mouth/Throat:     Mouth: Mucous membranes are moist.     Pharynx: Oropharynx is clear.  Eyes:     Extraocular Movements: Extraocular movements intact.     Pupils: Pupils are equal, round, and reactive to light.  Neck:     Vascular: No JVD.     Comments: No JVD, no bruit Cardiovascular:     Rate and Rhythm: Normal rate and regular rhythm.     Pulses: Normal pulses.     Heart sounds: Normal heart sounds. No murmur heard.    No friction rub. No gallop.  Pulmonary:     Effort: Tachypnea present.     Breath sounds: Examination of the right-upper field reveals decreased breath sounds. Examination of the left-upper field reveals decreased breath sounds. Examination of the right-middle field reveals decreased breath sounds. Examination of the left-middle field reveals decreased breath  sounds. Examination of the right-lower field reveals decreased breath sounds. Examination of the left-lower field reveals decreased breath sounds. Decreased breath sounds present.     Comments: Diminished breath sounds noted throughout particularly bibasilarly; initial respirations 36 with SpO2 of 79% EMS called Musculoskeletal:     Cervical back: Normal range of motion and neck supple.  Lymphadenopathy:     Cervical: No cervical adenopathy.  Skin:    General: Skin is warm and dry.   Neurological:     General: No focal deficit present.     Mental Status: She is alert and oriented to person, place, and time.      UC Treatments / Results  Labs (all labs ordered are listed, but only abnormal results are displayed) Labs Reviewed - No data to display  EKG   Radiology No results found.  Procedures Procedures (including critical care time)  Medications Ordered in UC Medications - No data to display  Initial Impression / Assessment and Plan / UC Course  I have reviewed the triage vital signs and the nursing notes.  Pertinent labs & imaging results that were available during my care of the patient were reviewed by me and considered in my medical decision making (see chart for details).     MDM: 1.  Shortness of breath-patient transported to [place] ED via EMS-directed EMT. EMS arrived at 11:55 AM somewhat unsure whether they can take patient to [place] ED EMT onsite said that she needed to see how many trucks were available before she made the decision outside at her truck.  Patient's husband informed front desk that EMS would not take his wife to [facility] ED for further evaluation as she was unable to cross [employer] due to availability of other trucks.  Now 12:16 PM Patient's husband has now transported patient by his personal vehicle to [facility] ED.  RN on staff has spoken with RN at [facility] ED to inform them of patient's current situation.  Final Clinical Impressions(s) / UC Diagnoses   Final diagnoses:  SOB (shortness of breath)     Discharge Instructions      EMS arrived at 11:55 AM somewhat unsure whether they can take patient to [place] ED EMT onsite said that she needed to see how many trucks were available before she made the decision outside at her truck.  Patient advised to go to [place] ED now for further evaluation of EMS and  please do request EMS to take.     ED Prescriptions   None    PDMP not reviewed this encounter.   Eliezer Lofts, [place] 05/10/22 1218

## 2022-05-11 ENCOUNTER — Telehealth: Payer: Self-pay

## 2022-05-11 DIAGNOSIS — J441 Chronic obstructive pulmonary disease with (acute) exacerbation: Secondary | ICD-10-CM | POA: Diagnosis not present

## 2022-05-11 DIAGNOSIS — J9601 Acute respiratory failure with hypoxia: Secondary | ICD-10-CM | POA: Diagnosis not present

## 2022-05-11 DIAGNOSIS — I480 Paroxysmal atrial fibrillation: Secondary | ICD-10-CM

## 2022-05-11 DIAGNOSIS — J189 Pneumonia, unspecified organism: Secondary | ICD-10-CM

## 2022-05-11 LAB — CBC
HCT: 37 % (ref 36.0–46.0)
Hemoglobin: 11.9 g/dL — ABNORMAL LOW (ref 12.0–15.0)
MCH: 30.4 pg (ref 26.0–34.0)
MCHC: 32.2 g/dL (ref 30.0–36.0)
MCV: 94.4 fL (ref 80.0–100.0)
Platelets: 258 10*3/uL (ref 150–400)
RBC: 3.92 MIL/uL (ref 3.87–5.11)
RDW: 13.4 % (ref 11.5–15.5)
WBC: 11.8 10*3/uL — ABNORMAL HIGH (ref 4.0–10.5)
nRBC: 0 % (ref 0.0–0.2)

## 2022-05-11 LAB — BASIC METABOLIC PANEL
Anion gap: 10 (ref 5–15)
BUN: 22 mg/dL (ref 8–23)
CO2: 22 mmol/L (ref 22–32)
Calcium: 8.5 mg/dL — ABNORMAL LOW (ref 8.9–10.3)
Chloride: 106 mmol/L (ref 98–111)
Creatinine, Ser: 0.96 mg/dL (ref 0.44–1.00)
GFR, Estimated: >60 mL/min (ref >60)
Glucose, Bld: 148 mg/dL — ABNORMAL HIGH (ref 70–99)
Potassium: 3.6 mmol/L (ref 3.5–5.1)
Sodium: 138 mmol/L (ref 135–145)

## 2022-05-11 LAB — STREP PNEUMONIAE URINARY ANTIGEN: Strep Pneumo Urinary Antigen: NEGATIVE

## 2022-05-11 LAB — HIV ANTIBODY (ROUTINE TESTING W REFLEX): HIV Screen 4th Generation wRfx: NONREACTIVE

## 2022-05-11 LAB — MAGNESIUM: Magnesium: 2.5 mg/dL — ABNORMAL HIGH (ref 1.7–2.4)

## 2022-05-11 LAB — MRSA NEXT GEN BY PCR, NASAL: MRSA by PCR Next Gen: NOT DETECTED

## 2022-05-11 MED ORDER — THYROID 60 MG PO TABS
120.0000 mg | ORAL_TABLET | Freq: Every day | ORAL | Status: DC
  Administered 2022-05-12 – 2022-05-14 (×3): 120 mg via ORAL
  Filled 2022-05-11: qty 2
  Filled 2022-05-11: qty 1
  Filled 2022-05-11: qty 2

## 2022-05-11 MED ORDER — TIZANIDINE HCL 4 MG PO TABS
2.0000 mg | ORAL_TABLET | Freq: Four times a day (QID) | ORAL | Status: DC | PRN
  Administered 2022-05-11: 2 mg via ORAL
  Administered 2022-05-11 – 2022-05-12 (×2): 4 mg via ORAL
  Administered 2022-05-12: 2 mg via ORAL
  Administered 2022-05-12 – 2022-05-13 (×4): 4 mg via ORAL
  Administered 2022-05-14: 2 mg via ORAL
  Filled 2022-05-11 (×10): qty 1

## 2022-05-11 MED ORDER — PANTOPRAZOLE SODIUM 40 MG PO TBEC
40.0000 mg | DELAYED_RELEASE_TABLET | Freq: Two times a day (BID) | ORAL | Status: DC
  Administered 2022-05-11 – 2022-05-14 (×7): 40 mg via ORAL
  Filled 2022-05-11 (×7): qty 1

## 2022-05-11 MED ORDER — MOMETASONE FURO-FORMOTEROL FUM 200-5 MCG/ACT IN AERO
2.0000 | INHALATION_SPRAY | Freq: Two times a day (BID) | RESPIRATORY_TRACT | Status: DC
  Administered 2022-05-11 – 2022-05-14 (×7): 2 via RESPIRATORY_TRACT
  Filled 2022-05-11: qty 8.8

## 2022-05-11 MED ORDER — TIZANIDINE HCL 4 MG PO TABS
2.0000 mg | ORAL_TABLET | Freq: Once | ORAL | Status: AC
  Administered 2022-05-11: 2 mg via ORAL
  Filled 2022-05-11: qty 1

## 2022-05-11 MED ORDER — FUROSEMIDE 10 MG/ML IJ SOLN
40.0000 mg | Freq: Every day | INTRAMUSCULAR | Status: DC
  Administered 2022-05-12 – 2022-05-14 (×3): 40 mg via INTRAVENOUS
  Filled 2022-05-11 (×3): qty 4

## 2022-05-11 MED ORDER — THYROID 30 MG PO TABS
30.0000 mg | ORAL_TABLET | Freq: Every day | ORAL | Status: DC
  Administered 2022-05-11 – 2022-05-14 (×4): 30 mg via ORAL
  Filled 2022-05-11 (×4): qty 1

## 2022-05-11 MED ORDER — PROGESTERONE MICRONIZED 100 MG PO CAPS
300.0000 mg | ORAL_CAPSULE | Freq: Every evening | ORAL | Status: DC
  Administered 2022-05-11 – 2022-05-13 (×3): 300 mg via ORAL
  Filled 2022-05-11 (×4): qty 3

## 2022-05-11 NOTE — Telephone Encounter (Signed)
TC to f/u after yesterday's visit to [facility]. No answer; left VM to call [phone] for problems or questions. 

## 2022-05-11 NOTE — Progress Notes (Signed)
[facility]                                                                               Taylor [name], is a 71 y.o. female, DOB - 05/11/1951, KGM:[id number] Admit date - 05/10/2022    Outpatient Primary MD for the patient is [place], Cleta Alberts, NP  LOS - 1  days    Brief summary   71 year old female with past medical history of anxiety and depression, diabetes mellitus, fibromyalgia, spinal stenosis, chronic pain, COPD, recurrent pneumonia, and atrial fibrillation admitted for SOB. She was found to be hypoxic and CT chest showed multifocal pneumonia.   Assessment & Plan    Assessment and Plan:    Acute respiratory failure with hypoxia from multifocal pneumonia and COPD exacerbation:  Continue with IV antibiotics.  [place] oxygen to keep sats greater than 90%.  COVID pcr IS NEGATIVE, Respiratory panel is negative.  Influenza panel.  Follow up blood cultures.  Sputum cultures ordered today.  Continue with dulera and bronchodilators prn.  Urine for strep pneumoniae and legionella antigen ordered and pending.   Improving leukocytosis.  Trend pro calcitonin.   Atrial fibrillation:  Rate controlled.   Fibromyalgia; Resume home meds.    Anxiety and depression:  Resume home meds.   Hypothyroidism;  Resume thyroid [place].    Hypokalemia:  Replaced.    Chronic diastolic heart failure:  Elevated BNP. Last echo from 2019 showing grade  2 diastolic dysfunction.  Start her on IV lasix 40 mg daily ,  Check daily weights and strict intake and output.  Troponins are negative and she denies any chest pain.      Estimated body mass index is 32.44 kg/m as calculated from the following:   Height as of this encounter: '5\' 6"'$  (1.676 m).   Weight as of this encounter: 91.2 kg.  Code Status: full code.  DVT Prophylaxis:  enoxaparin (LOVENOX) injection 40 mg Start: 05/10/22 2300 SCDs Start: 05/10/22 2209   Level of Care: Level of care: Progressive Family  Communication: none at bedside.   Disposition Plan:     Remains inpatient appropriate:  IV antibiotics.   Procedures:   none Consultants:   None.   Antimicrobials:   Anti-infectives (From admission, onward)    Start     Dose/Rate Route Frequency Ordered Stop   05/10/22 1800  azithromycin (ZITHROMAX) 500 mg in sodium chloride 0.9 % 250 mL IVPB  Status:  Discontinued        500 mg 250 mL/hr over 60 Minutes Intravenous Every 24 hours 05/10/22 1548 05/10/22 2303   05/10/22 1630  ceFEPIme (MAXIPIME) 2 g in sodium chloride 0.9 % 100 mL IVPB        2 g 200 mL/hr over 30 Minutes Intravenous Every 8 hours 05/10/22 1615     05/10/22 1600  cefTRIAXone (ROCEPHIN) 1 g in sodium chloride 0.9 % 100 mL IVPB  Status:  Discontinued        1 g 200 mL/hr over 30 Minutes Intravenous Every 24 hours 05/10/22 1548 05/10/22 1612   05/10/22 1500  doxycycline (VIBRAMYCIN) 100 mg in sodium chloride 0.9 % 250 mL IVPB  100 mg 125 mL/hr over 120 Minutes Intravenous  Once 05/10/22 1453 05/10/22 1733        Medications  Scheduled Meds:  buPROPion  300 mg Oral Daily   enoxaparin (LOVENOX) injection  40 mg Subcutaneous Q24H   ezetimibe  10 mg Oral Daily   FLUoxetine  20 mg Oral Daily   furosemide  40 mg Oral Daily   levalbuterol  0.63 mg Nebulization TID   And   ipratropium  0.5 mg Nebulization TID   loratadine  10 mg Oral Daily   methylPREDNISolone (SOLU-MEDROL) injection  40 mg Intravenous Q12H   metoprolol tartrate  25 mg Oral BID   mometasone-formoterol  2 puff Inhalation BID   montelukast  10 mg Oral QHS   pantoprazole  40 mg Oral BID   pregabalin  75 mg Oral TID   progesterone  300 mg Oral QPM   [START ON 05/12/2022] thyroid  120 mg Oral QAC breakfast   And   thyroid  30 mg Oral Q lunch   Continuous Infusions:  ceFEPime (MAXIPIME) IV 2 g (05/11/22 1602)   PRN Meds:.acetaminophen **OR** acetaminophen, calcium carbonate, guaiFENesin, hydrOXYzine, levalbuterol, oxyCODONE,  tiZANidine    Subjective:   [name] was seen and examined today.  Breathing much better than yesterday.   Objective:   Vitals:   05/11/22 0921 05/11/22 0948 05/11/22 1055 05/11/22 1311  BP:  (!) 114/53  120/64  Pulse:  67 68 63  Resp:  16  18  Temp:      TempSrc:      SpO2: 91% 95%  95%  Weight:      Height:        Intake/Output Summary (Last 24 hours) at 05/11/2022 1758 Last data filed at 05/11/2022 0554 Gross per 24 hour  Intake 450.26 ml  Output 1700 ml  Net -1249.74 ml   Filed Weights   05/10/22 1242  Weight: 91.2 kg     Exam General exam: Appears calm and comfortable  Respiratory system: Clear to auscultation. Respiratory effort normal. Cardiovascular system: S1 & S2 heard, RRR. No JVD,  No pedal edema. Gastrointestinal system: Abdomen is nondistended, soft and nontender.  Central nervous system: Alert and oriented. No focal neurological deficits. Extremities: Symmetric 5 x 5 power. Skin: No rashes, lesions or ulcers Psychiatry:  Mood & affect appropriate.     Data Reviewed:  I have personally reviewed following labs and imaging studies   CBC Lab Results  Component Value Date   WBC 11.8 (H) 05/11/2022   RBC 3.92 05/11/2022   HGB 11.9 (L) 05/11/2022   HCT 37.0 05/11/2022   MCV 94.4 05/11/2022   MCH 30.4 05/11/2022   PLT 258 05/11/2022   MCHC 32.2 05/11/2022   RDW 13.4 05/11/2022   LYMPHSABS 0.9 05/10/2022   MONOABS 1.2 (H) 05/10/2022   EOSABS 0.2 05/10/2022   BASOSABS 0.1 00/76/2263     Last metabolic panel Lab Results  Component Value Date   NA 138 05/11/2022   K 3.6 05/11/2022   CL 106 05/11/2022   CO2 22 05/11/2022   BUN 22 05/11/2022   CREATININE 0.96 05/11/2022   GLUCOSE 148 (H) 05/11/2022   GFRNONAA >60 05/11/2022   GFRAA 60 (L) 09/21/2019   CALCIUM 8.5 (L) 05/11/2022   PHOS 2.5 07/22/2013   PROT 6.2 07/23/2013   ALBUMIN 2.7 (L) 07/23/2013   BILITOT 0.5 07/23/2013   ALKPHOS 89 07/23/2013   AST 21 07/23/2013   ALT 23  07/23/2013   ANIONGAP  10 05/11/2022    CBG (last 3)  No results for input(s): "GLUCAP" in the last 72 hours.    Coagulation Profile: No results for input(s): "INR", "PROTIME" in the last 168 hours.   Radiology Studies: CT Chest W Contrast  Result Date: 05/10/2022 CLINICAL DATA:  Respiratory illness. Cough and shortness of breath for 1 week. EXAM: CT CHEST WITH CONTRAST TECHNIQUE: Multidetector CT imaging of the chest was performed during intravenous contrast administration. RADIATION DOSE REDUCTION: This exam was performed according to the departmental dose-optimization program which includes automated exposure control, adjustment of the mA and/or kV according to patient size and/or use of iterative reconstruction technique. CONTRAST:  9m OMNIPAQUE IOHEXOL 300 MG/ML  SOLN COMPARISON:  April 16, 2022 FINDINGS: Cardiovascular: No significant vascular findings. Heart size is mildly enlarged. No pericardial effusion. Mediastinum/Nodes: Mediastinal and hilar lymphadenopathy, favor reactive lymph nodes. Trachea, esophagus and thyroid glands are unremarkable. Lungs/Pleura: Consolidations with increased pulmonary interstitium identified in bilateral upper lobes, bilateral lower lobes with relative sparing of the right middle lobe. 4 mm focal peripheral nodule in the right middle lobe unchanged. Small right pleural effusion. Upper Abdomen: No acute abnormality. 10.2 cm simple cyst of left kidney is in size, incompletely included. Musculoskeletal: Degenerative joint changes of the spine are noted. IMPRESSION: 1. Consolidations with increased pulmonary interstitium identified in bilateral upper lobes, bilateral lower lobes with relative sparing of the right middle lobe. Findings are consistent with multifocal pneumonia. 2. Small right pleural effusion. 3. Mediastinal and hilar lymphadenopathy, favor reactive lymph nodes. 4. 10.2 cm simple cyst of left kidney, incompletely included. Electronically Signed    By: WAbelardo DieselM.D.   On: 05/10/2022 14:54   DG Chest Portable 1 View  Result Date: 05/10/2022 CLINICAL DATA:  Shortness of breath, cough EXAM: PORTABLE CHEST 1 VIEW COMPARISON:  Previous studies including chest radiograph done on 01/21/2022 and CT chest done on 04/16/2022 FINDINGS: Transverse diameter of heart is increased. Central pulmonary vessels are prominent. Increased interstitial and alveolar markings are seen in right upper lung fields and both lower lung fields. There is elevation of minor fissure on the right side suggesting decreased volume in right upper lobe. There is no significant pleural effusion. There is no pneumothorax. IMPRESSION: Cardiomegaly. Infiltrates are seen in right upper lobe and both lower lung fields suggesting multifocal pneumonia. Part of this finding may suggest underlying scarring or pulmonary edema. Electronically Signed   By: PElmer PickerM.D.   On: 05/10/2022 13:09       VHosie PoissonM.D. [facility] 05/11/2022, 5:58 PM  Available via Epic secure chat 7am-7pm After 7 pm, please refer to night coverage provider listed on amion.

## 2022-05-12 DIAGNOSIS — J9601 Acute respiratory failure with hypoxia: Secondary | ICD-10-CM | POA: Diagnosis not present

## 2022-05-12 DIAGNOSIS — J189 Pneumonia, unspecified organism: Secondary | ICD-10-CM | POA: Diagnosis not present

## 2022-05-12 DIAGNOSIS — J441 Chronic obstructive pulmonary disease with (acute) exacerbation: Secondary | ICD-10-CM | POA: Diagnosis not present

## 2022-05-12 DIAGNOSIS — I480 Paroxysmal atrial fibrillation: Secondary | ICD-10-CM | POA: Diagnosis not present

## 2022-05-12 LAB — BASIC METABOLIC PANEL
Anion gap: 8 (ref 5–15)
BUN: 25 mg/dL — ABNORMAL HIGH (ref 8–23)
CO2: 23 mmol/L (ref 22–32)
Calcium: 9 mg/dL (ref 8.9–10.3)
Chloride: 108 mmol/L (ref 98–111)
Creatinine, Ser: 0.92 mg/dL (ref 0.44–1.00)
GFR, Estimated: >60 mL/min (ref >60)
Glucose, Bld: 169 mg/dL — ABNORMAL HIGH (ref 70–99)
Potassium: 3.7 mmol/L (ref 3.5–5.1)
Sodium: 139 mmol/L (ref 135–145)

## 2022-05-12 LAB — CBC WITH DIFFERENTIAL/PLATELET
Abs Immature Granulocytes: 0.13 10*3/uL — ABNORMAL HIGH (ref 0.00–0.07)
Basophils Absolute: 0 10*3/uL (ref 0.0–0.1)
Basophils Relative: 0 %
Eosinophils Absolute: 0 10*3/uL (ref 0.0–0.5)
Eosinophils Relative: 0 %
HCT: 38.4 % (ref 36.0–46.0)
Hemoglobin: 12.2 g/dL (ref 12.0–15.0)
Immature Granulocytes: 1 %
Lymphocytes Relative: 6 %
Lymphs Abs: 0.8 10*3/uL (ref 0.7–4.0)
MCH: 30.1 pg (ref 26.0–34.0)
MCHC: 31.8 g/dL (ref 30.0–36.0)
MCV: 94.8 fL (ref 80.0–100.0)
Monocytes Absolute: 0.7 10*3/uL (ref 0.1–1.0)
Monocytes Relative: 5 %
Neutro Abs: 13 10*3/uL — ABNORMAL HIGH (ref 1.7–7.7)
Neutrophils Relative %: 88 %
Platelets: 264 10*3/uL (ref 150–400)
RBC: 4.05 MIL/uL (ref 3.87–5.11)
RDW: 13.6 % (ref 11.5–15.5)
WBC: 14.7 10*3/uL — ABNORMAL HIGH (ref 4.0–10.5)
nRBC: 0 % (ref 0.0–0.2)

## 2022-05-12 LAB — MAGNESIUM: Magnesium: 2.5 mg/dL — ABNORMAL HIGH (ref 1.7–2.4)

## 2022-05-12 MED ORDER — NYSTATIN 100000 UNIT/ML MT SUSP
5.0000 mL | Freq: Four times a day (QID) | OROMUCOSAL | Status: DC
  Administered 2022-05-12 – 2022-05-14 (×8): 500000 [IU] via ORAL
  Filled 2022-05-12 (×9): qty 5

## 2022-05-12 MED ORDER — PREDNISONE 20 MG PO TABS
40.0000 mg | ORAL_TABLET | Freq: Every day | ORAL | Status: DC
  Administered 2022-05-13 – 2022-05-14 (×2): 40 mg via ORAL
  Filled 2022-05-12 (×2): qty 2

## 2022-05-12 MED ORDER — SALINE SPRAY 0.65 % NA SOLN
1.0000 | NASAL | Status: DC | PRN
  Filled 2022-05-12: qty 44

## 2022-05-12 MED ORDER — FLUCONAZOLE 100 MG PO TABS
200.0000 mg | ORAL_TABLET | Freq: Once | ORAL | Status: AC
  Administered 2022-05-12: 200 mg via ORAL
  Filled 2022-05-12: qty 2

## 2022-05-12 NOTE — TOC Initial Note (Signed)
Transition of Care [facility]) - Initial/Assessment Note    Patient Details  Name: Taylor [name] MRN: [id number] Date of Birth: 1951-01-11  Transition of Care [facility]) CM/SW Contact:    Leeroy Cha, RN Phone Number: 05/12/2022, 8:29 AM  Clinical Narrative:                 Transition of Care [facility]) Screening Note   Patient Details  Name: Taylor [name] Date of Birth: Feb 17, 1951   Transition of Care [facility]) CM/SW Contact:    Leeroy Cha, RN Phone Number: 05/12/2022, 8:29 AM    Transition of Care Department [facility]) has reviewed patient and no TOC needs have been identified at this time. We will continue to monitor patient advancement through interdisciplinary progression rounds. If new patient transition needs arise, please place a TOC consult.      Barriers to Discharge: Continued Medical Work up   Patient Goals and CMS Choice Patient states their goals for this hospitalization and ongoing recovery are:: to return to my home and be able to breathe CMS Medicare.gov Compare Post Acute Care list provided to:: Patient Choice offered to / list presented to : Patient  Expected Discharge Plan and Services     Discharge Planning Services: CM Consult   Living arrangements for the past 2 months: Single Family Home                                      Prior Living Arrangements/Services Living arrangements for the past 2 months: Single Family Home Lives with:: Spouse Patient language and need for interpreter reviewed:: Yes Do you feel safe going back to the place where you live?: Yes            Criminal Activity/Legal Involvement Pertinent to Current Situation/Hospitalization: No - Comment as needed  Activities of Daily Living Home Assistive Devices/Equipment: CPAP, Eyeglasses, Contact lenses ADL Screening (condition at time of admission) Patient's cognitive ability adequate to safely complete daily activities?: No Is the patient deaf or have difficulty  hearing?: No Does the patient have difficulty seeing, even when wearing glasses/contacts?: No Does the patient have difficulty concentrating, remembering, or making decisions?: No Patient able to express need for assistance with ADLs?: Yes Does the patient have difficulty dressing or bathing?: No Independently performs ADLs?: Yes (appropriate for developmental age) Does the patient have difficulty walking or climbing stairs?: No Weakness of Legs: None Weakness of Arms/Hands: None  Permission Sought/Granted                  Emotional Assessment Appearance:: Appears stated age Attitude/Demeanor/Rapport: Engaged Affect (typically observed): Calm Orientation: : Oriented to Self, Oriented to Place, Oriented to  Time, Oriented to Situation Alcohol / Substance Use: Tobacco Use (former smoker quit 14 years ago) Psych Involvement: No (comment)  Admission diagnosis:  Pleural effusion [J90] Hypoxia [R09.02] COPD exacerbation (HCC) [J44.1] Acute respiratory failure with hypoxia ([place]) [J96.01] Pneumonia of both lungs due to infectious organism, unspecified part of lung [J18.9] Patient Active Problem List   Diagnosis Date Noted   Acute respiratory failure with hypoxia ([place]) 05/10/2022   Acute exacerbation of chronic obstructive pulmonary disease (COPD) ([place]) 05/10/2022   Paroxysmal atrial fibrillation with RVR ([place]) 05/10/2022   Allergic rhinitis 02/19/2022   OSA (obstructive sleep apnea) 02/19/2022   GERD (gastroesophageal reflux disease) 02/19/2022   Secondary hypercoagulable state ([place]) 10/19/2019   Pleuritis 07/25/2013   CAP (  community acquired pneumonia) 07/24/2013   COPD (chronic obstructive pulmonary disease) ([place]) 07/24/2013   Tobacco use 07/24/2013   Recurrent pneumonia 07/24/2013   Lung mass 07/24/2013   Pleural effusion 07/24/2013   UTI (urinary tract infection) 07/22/2013   Weakness 07/22/2013   HTN (hypertension) 07/22/2013   Leukocytosis 07/22/2013   Hyponatremia  07/22/2013   Acute renal failure ([place]) 07/22/2013   Atrial fibrillation ([place]) 08/06/2008   PCP:  [place], Julie M, NP Pharmacy:   [place], [place] - [address] [place] [address] Phone: [phone] Fax: [phone]  [facility] [place], [place] - [address] [place] [address] Phone: [phone] Fax: [phone]  [facility] [id number] - [place], [place] - [address] [place] [address] Phone: [phone] Fax: [phone]     Social Determinants of Health (SDOH) Interventions    Readmission Risk Interventions   No data to display

## 2022-05-12 NOTE — Progress Notes (Signed)
[facility]                                                                               Malayah [name], is a 71 y.o. female, DOB - [birth year], STM:[id number] Admit date - 05/10/2022    Outpatient Primary MD for the patient is Taylor [name], Cleta Alberts, NP  LOS - 2  days    Brief summary   71 year old female with past medical history of anxiety and depression, diabetes mellitus, fibromyalgia, spinal stenosis, chronic pain, COPD, recurrent pneumonia, and atrial fibrillation admitted for SOB. She was found to be hypoxic and CT chest showed multifocal pneumonia.   Assessment & Plan    Assessment and Plan:    Acute respiratory failure with hypoxia from multifocal pneumonia and COPD exacerbation:  Continue with IV antibiotics.  [place] oxygen to keep sats greater than 90%.  Covid PCR is negative.  Respiratory panel is negative.  Influenza panel is negative.  Follow up blood cultures.  Sputum cultures ordered today and pending.  Continue with dulera and bronchodilators prn.  Urine for strep pneumoniae is negative and legionella antigen is pending.  Check ambulating oxygen levels.   Improving leukocytosis.  Trend pro calcitonin.   Atrial fibrillation:  Rate controlled.   Fibromyalgia; Resume home meds.    Anxiety and depression:  Resume home meds.   Hypothyroidism;  Resume thyroid [place].    Hypokalemia:  Replaced.     Mild acute on Chronic diastolic heart failure:  Elevated BNP. Last echo from 2019 showing grade  2 diastolic dysfunction.  Start her on IV lasix 40 mg daily ,  Check daily weights and strict intake and output.  Troponins are negative and she denies any chest pain.  Last echocardiogram from 01/2022 show moderate diastolic dysfunction , while LVEF is preserved.      Estimated body mass index is 32.44 kg/m as calculated from the following:   Height as of this encounter: '5\' 6"'$  (1.676 m).   Weight as of this encounter: 91.2 kg.  Code Status: full  code.  DVT Prophylaxis:  enoxaparin (LOVENOX) injection 40 mg Start: 05/10/22 2300 SCDs Start: 05/10/22 2209   Level of Care: Level of care: Progressive Family Communication: none at bedside.   Disposition Plan:     Remains inpatient appropriate:  IV antibiotics.   Procedures:   none Consultants:   None.   Antimicrobials:   Anti-infectives (From admission, onward)    Start     Dose/Rate Route Frequency Ordered Stop   05/10/22 1800  azithromycin (ZITHROMAX) 500 mg in sodium chloride 0.9 % 250 mL IVPB  Status:  Discontinued        500 mg 250 mL/hr over 60 Minutes Intravenous Every 24 hours 05/10/22 1548 05/10/22 2303   05/10/22 1630  ceFEPIme (MAXIPIME) 2 g in sodium chloride 0.9 % 100 mL IVPB        2 g 200 mL/hr over 30 Minutes Intravenous Every 8 hours 05/10/22 1615     05/10/22 1600  cefTRIAXone (ROCEPHIN) 1 g in sodium chloride 0.9 % 100 mL IVPB  Status:  Discontinued        1 g 200 mL/hr over 30 Minutes  Intravenous Every 24 hours 05/10/22 1548 05/10/22 1612   05/10/22 1500  doxycycline (VIBRAMYCIN) 100 mg in sodium chloride 0.9 % 250 mL IVPB        100 mg 125 mL/hr over 120 Minutes Intravenous  Once 05/10/22 1453 05/10/22 1733        Medications  Scheduled Meds:  buPROPion  300 mg Oral Daily   enoxaparin (LOVENOX) injection  40 mg Subcutaneous Q24H   ezetimibe  10 mg Oral Daily   FLUoxetine  20 mg Oral Daily   furosemide  40 mg Intravenous Daily   levalbuterol  0.63 mg Nebulization TID   And   ipratropium  0.5 mg Nebulization TID   loratadine  10 mg Oral Daily   methylPREDNISolone (SOLU-MEDROL) injection  40 mg Intravenous Q12H   metoprolol tartrate  25 mg Oral BID   mometasone-formoterol  2 puff Inhalation BID   montelukast  10 mg Oral QHS   pantoprazole  40 mg Oral BID   pregabalin  75 mg Oral TID   progesterone  300 mg Oral QPM   thyroid  120 mg Oral QAC breakfast   And   thyroid  30 mg Oral Q lunch   Continuous Infusions:  ceFEPime (MAXIPIME) IV 2  g (05/12/22 0852)   PRN Meds:.acetaminophen **OR** acetaminophen, calcium carbonate, guaiFENesin, hydrOXYzine, levalbuterol, oxyCODONE, tiZANidine    Subjective:   Taylor [name] was seen and examined today.  Breathing and cough are better.   Objective:   Vitals:   05/11/22 1943 05/11/22 2041 05/12/22 0510 05/12/22 0740  BP:  (!) 124/55 137/68   Pulse:  69 62   Resp:  20 (!) 21   Temp:  98.3 F (36.8 C) 98 F (36.7 C)   TempSrc:  Oral Oral   SpO2: 92% 95% 90% 94%  Weight:      Height:        Intake/Output Summary (Last 24 hours) at 05/12/2022 1137 Last data filed at 05/12/2022 0400 Gross per 24 hour  Intake 200 ml  Output 700 ml  Net -500 ml    Filed Weights   05/10/22 1242  Weight: 91.2 kg     Exam General exam: Appears calm and comfortable  Respiratory system: air entry fair, scattered faint wheezing, on 2 lit of [place] oxygen.  Cardiovascular system: S1 & S2 heard, RRR. No JVD, No pedal edema. Gastrointestinal system: Abdomen is nondistended, soft and nontender.  Central nervous system: Alert and oriented. No focal neurological deficits. Extremities: Symmetric 5 x 5 power. Skin: No rashes, Psychiatry:  Mood & affect appropriate.      Data Reviewed:  I have personally reviewed following labs and imaging studies   CBC Lab Results  Component Value Date   WBC 14.7 (H) 05/12/2022   RBC 4.05 05/12/2022   HGB 12.2 05/12/2022   HCT 38.4 05/12/2022   MCV 94.8 05/12/2022   MCH 30.1 05/12/2022   PLT 264 05/12/2022   MCHC 31.8 05/12/2022   RDW 13.6 05/12/2022   LYMPHSABS 0.8 05/12/2022   MONOABS 0.7 05/12/2022   EOSABS 0.0 05/12/2022   BASOSABS 0.0 18/84/1660     Last metabolic panel Lab Results  Component Value Date   NA 139 05/12/2022   K 3.7 05/12/2022   CL 108 05/12/2022   CO2 23 05/12/2022   BUN 25 (H) 05/12/2022   CREATININE 0.92 05/12/2022   GLUCOSE 169 (H) 05/12/2022   GFRNONAA >60 05/12/2022   GFRAA 60 (L) 09/21/2019   CALCIUM 9.0  05/12/2022  PHOS 2.5 07/22/2013   PROT 6.2 07/23/2013   ALBUMIN 2.7 (L) 07/23/2013   BILITOT 0.5 07/23/2013   ALKPHOS 89 07/23/2013   AST 21 07/23/2013   ALT 23 07/23/2013   ANIONGAP 8 05/12/2022    CBG (last 3)  No results for input(s): "GLUCAP" in the last 72 hours.    Coagulation Profile: No results for input(s): "INR", "PROTIME" in the last 168 hours.   Radiology Studies: CT Chest W Contrast  Result Date: 05/10/2022 CLINICAL DATA:  Respiratory illness. Cough and shortness of breath for 1 week. EXAM: CT CHEST WITH CONTRAST TECHNIQUE: Multidetector CT imaging of the chest was performed during intravenous contrast administration. RADIATION DOSE REDUCTION: This exam was performed according to the departmental dose-optimization program which includes automated exposure control, adjustment of the mA and/or kV according to patient size and/or use of iterative reconstruction technique. CONTRAST:  71m OMNIPAQUE IOHEXOL 300 MG/ML  SOLN COMPARISON:  April 16, 2022 FINDINGS: Cardiovascular: No significant vascular findings. Heart size is mildly enlarged. No pericardial effusion. Mediastinum/Nodes: Mediastinal and hilar lymphadenopathy, favor reactive lymph nodes. Trachea, esophagus and thyroid glands are unremarkable. Lungs/Pleura: Consolidations with increased pulmonary interstitium identified in bilateral upper lobes, bilateral lower lobes with relative sparing of the right middle lobe. 4 mm focal peripheral nodule in the right middle lobe unchanged. Small right pleural effusion. Upper Abdomen: No acute abnormality. 10.2 cm simple cyst of left kidney is in size, incompletely included. Musculoskeletal: Degenerative joint changes of the spine are noted. IMPRESSION: 1. Consolidations with increased pulmonary interstitium identified in bilateral upper lobes, bilateral lower lobes with relative sparing of the right middle lobe. Findings are consistent with multifocal pneumonia. 2. Small right pleural  effusion. 3. Mediastinal and hilar lymphadenopathy, favor reactive lymph nodes. 4. 10.2 cm simple cyst of left kidney, incompletely included. Electronically Signed   By: WAbelardo DieselM.D.   On: 05/10/2022 14:54   DG Chest Portable 1 View  Result Date: 05/10/2022 CLINICAL DATA:  Shortness of breath, cough EXAM: PORTABLE CHEST 1 VIEW COMPARISON:  Previous studies including chest radiograph done on 01/21/2022 and CT chest done on 04/16/2022 FINDINGS: Transverse diameter of heart is increased. Central pulmonary vessels are prominent. Increased interstitial and alveolar markings are seen in right upper lung fields and both lower lung fields. There is elevation of minor fissure on the right side suggesting decreased volume in right upper lobe. There is no significant pleural effusion. There is no pneumothorax. IMPRESSION: Cardiomegaly. Infiltrates are seen in right upper lobe and both lower lung fields suggesting multifocal pneumonia. Part of this finding may suggest underlying scarring or pulmonary edema. Electronically Signed   By: PElmer PickerM.D.   On: 05/10/2022 13:09       VHosie PoissonM.D. [facility] 05/12/2022, 11:37 AM  Available via Epic secure chat 7am-7pm After 7 pm, please refer to night coverage provider listed on amion.

## 2022-05-12 NOTE — Evaluation (Addendum)
Physical Therapy Evaluation-1x Patient Details Name: Taylor [name] MRN: [id number] DOB: 1950/09/01 Today's Date: 05/12/2022  SATURATION QUALIFICATIONS: (This note is used to comply with regulatory documentation for home oxygen)  Patient Saturations on Room Air at Rest = 94%  Patient Saturations on Room Air while Ambulating = 87%  Patient Saturations on 2 Liters of oxygen while Ambulating = 91%    History of Present Illness  71 yo female admitted with acute respiratory failure, Afib with RVR. Hx of anxiety, depression, DM, fibromyalgia, Afib, Hepatitis, spinal stenosis, chronic pain, COPD, recurrent Pna, ACDF  Clinical Impression  On eval, pt was Supv-Mod Ind for mobility. She walked ~175 feet without a device. Ambulation distance was limited by dyspnea and pt report of feeling like her heart was racing. O2 was 87% on RA, and HR was 85 bpm. Placed pt on 2L [place] O2 for walk back to room: O2 91% on 2L. Do not anticipate any further acute PT needs. Will defer further mobility and walking O2 saturating monitoring/testing to nursing and/or mobility team. 1x eval. Will sign off.      Recommendations for follow up therapy are one component of a multi-disciplinary discharge planning process, led by the attending physician.  Recommendations may be updated based on patient status, additional functional criteria and insurance authorization.  Follow Up Recommendations No PT follow up      Assistance Recommended at Discharge PRN  Patient can return home with the following       Equipment Recommendations None recommended by PT  Recommendations for Other Services       Functional Status Assessment Patient has had a recent decline in their functional status and demonstrates the ability to make significant improvements in function in a reasonable and predictable amount of time.     Precautions / Restrictions Precautions Precaution Comments: monitor O2 Restrictions Weight Bearing Restrictions:  No      Mobility  Bed Mobility Overal bed mobility: Modified Independent                  Transfers Overall transfer level: Modified independent                      Ambulation/Gait Ambulation/Gait assistance: Supervision Gait Distance (Feet): 175 Feet Assistive device: None Gait Pattern/deviations: Step-through pattern, Decreased stride length       General Gait Details: Pt c/o dyspnea and heart feeling like it's racing while ambulating: O2 87% on RA, HR 85 bpm, dyspnea 2/4. Placed pt on 2L O2 for walk back to room: O2 91%.  Stairs            Wheelchair Mobility    Modified Rankin (Stroke Patients Only)       Balance Overall balance assessment: Mild deficits observed, not formally tested                                           Pertinent Vitals/Pain Pain Assessment Pain Assessment: Faces Faces Pain Scale: Hurts little more Pain Location: neck Pain Descriptors / Indicators: Discomfort, Aching Pain Intervention(s): RN gave pain meds during session    Home Living Family/patient expects to be discharged to:: Private residence Living Arrangements: Spouse/significant other Available Help at Discharge: Family Type of Home: House           Home Equipment: None      Prior Function Prior Level of  Function : Independent/Modified Independent                     Hand Dominance        Extremity/Trunk Assessment   Upper Extremity Assessment Upper Extremity Assessment: Overall WFL for tasks assessed    Lower Extremity Assessment Lower Extremity Assessment: Generalized weakness    Cervical / Trunk Assessment Cervical / Trunk Assessment: Normal  Communication   Communication: No difficulties  Cognition Arousal/Alertness: Awake/alert Behavior During Therapy: WFL for tasks assessed/performed Overall Cognitive Status: Within Functional Limits for tasks assessed                                           General Comments      Exercises     Assessment/Plan    PT Assessment Patient does not need any further PT services (can mobilize with nursing and/or mobility team)  PT Problem List         PT Treatment Interventions      PT Goals (Current goals can be found in the Care Plan section)  Acute Rehab PT Goals Patient Stated Goal: to get better PT Goal Formulation: All assessment and education complete, DC therapy    Frequency       Co-evaluation               AM-PAC PT "6 Clicks" Mobility  Outcome Measure Help needed turning from your back to your side while in a flat bed without using bedrails?: None Help needed moving from lying on your back to sitting on the side of a flat bed without using bedrails?: None Help needed moving to and from a bed to a chair (including a wheelchair)?: None Help needed standing up from a chair using your arms (e.g., wheelchair or bedside chair)?: None Help needed to walk in hospital room?: A Little Help needed climbing 3-5 steps with a railing? : A Little 6 Click Score: 22    End of Session   Activity Tolerance: Patient limited by fatigue (limited by dsyspnea) Patient left: in bed;with call bell/phone within reach   PT Visit Diagnosis: Difficulty in walking, not elsewhere classified (R26.2)    Time: 4917-9150 PT Time Calculation (min) (ACUTE ONLY): 15 min   Charges:   PT Evaluation $PT Eval Low Complexity: 1 Low             Doreatha Massed, PT Acute Rehabilitation  Office: [phone]

## 2022-05-13 DIAGNOSIS — I480 Paroxysmal atrial fibrillation: Secondary | ICD-10-CM | POA: Diagnosis not present

## 2022-05-13 DIAGNOSIS — J441 Chronic obstructive pulmonary disease with (acute) exacerbation: Secondary | ICD-10-CM | POA: Diagnosis not present

## 2022-05-13 DIAGNOSIS — J9601 Acute respiratory failure with hypoxia: Secondary | ICD-10-CM | POA: Diagnosis not present

## 2022-05-13 DIAGNOSIS — J189 Pneumonia, unspecified organism: Secondary | ICD-10-CM | POA: Diagnosis not present

## 2022-05-13 LAB — BASIC METABOLIC PANEL
Anion gap: 8 (ref 5–15)
BUN: 32 mg/dL — ABNORMAL HIGH (ref 8–23)
CO2: 25 mmol/L (ref 22–32)
Calcium: 8.4 mg/dL — ABNORMAL LOW (ref 8.9–10.3)
Chloride: 107 mmol/L (ref 98–111)
Creatinine, Ser: 0.91 mg/dL (ref 0.44–1.00)
GFR, Estimated: >60 mL/min (ref >60)
Glucose, Bld: 119 mg/dL — ABNORMAL HIGH (ref 70–99)
Potassium: 3.3 mmol/L — ABNORMAL LOW (ref 3.5–5.1)
Sodium: 140 mmol/L (ref 135–145)

## 2022-05-13 LAB — LEGIONELLA PNEUMOPHILA SEROGP 1 UR AG: L. pneumophila Serogp 1 Ur Ag: NEGATIVE

## 2022-05-13 MED ORDER — POTASSIUM CHLORIDE CRYS ER 20 MEQ PO TBCR
40.0000 meq | EXTENDED_RELEASE_TABLET | Freq: Once | ORAL | Status: AC
  Administered 2022-05-13: 40 meq via ORAL
  Filled 2022-05-13: qty 2

## 2022-05-13 NOTE — Progress Notes (Signed)
Pharmacy Antibiotic Note  Taylor [name] is a 71 y.o. female with hx COPD who presented to the ED on 05/10/2022 with c/o SOB and cough.  Chest CT on 05/10/22 showed findings consistent with multifocal PNA.  She's currently on cefepime for infection.  Today, 05/13/2022: - day #4 abx  - afeb - scr stable at 0.91 (crcl~66)  Plan: - continue cefepime 2gm IV q8h - With stable renal function, pharmacy will sign off for abx consult.  Reconsult [place] if need further assistance. _____________________________________  Height: '5\' 6"'$  (167.6 cm) Weight: 91.2 kg (201 lb) IBW/kg (Calculated) : 59.3  Temp (24hrs), Avg:98.1 F (36.7 C), Min:98 F (36.7 C), Max:98.4 F (36.9 C)  Recent Labs  Lab 05/07/22 1217 05/10/22 1307 05/11/22 0327 05/12/22 0333 05/13/22 0401  WBC  --  16.4* 11.8* 14.7*  --   CREATININE 1.05* 0.96 0.96 0.92 0.91    Estimated Creatinine Clearance: 66.4 mL/min (by C-G formula based on SCr of 0.91 mg/dL).    Allergies  Allergen Reactions   Avelox [Moxifloxacin Hcl In Nacl] Hives   Linezolid Rash   Venlafaxine Nausea Only and Rash   Rosuvastatin Other (See Comments)    myalgias   Cephalexin Itching and Rash   Clindamycin Diarrhea   Nitrofurantoin Itching and Rash   Other Rash    Brewer yeast and Bakers yeast causes cracks in mouth stomach bloating    Penicillins Itching and Rash    Has patient had a PCN reaction causing anaphylaxis, immediate rash, facial/tongue/throat swelling, SOB or lightheadedness with hypotension? no Has patient had a PCN reaction causing severe rash involving mucus membranes or skin necrosis?  no Has patient had a PCN reaction that required hospitilization?no  Has patient had a PCN reaction occurring within the last 10 years?  no If all of the above answers are "no" then may proceed with cephalosporin use.   Statins Other (See Comments)    Muscle pain     Thank you for allowing pharmacy to be a part of this patient's care.  Dia Sitter  P 05/13/2022 9:00 AM

## 2022-05-13 NOTE — Progress Notes (Signed)
[facility]                                                                               Taylor [name], is a 71 y.o. female, DOB - [birth year], UMP:[id number] Admit date - 05/10/2022    Outpatient Primary MD for the patient is [place], Cleta Alberts, NP  LOS - 3  days    Brief summary   70 year old female with past medical history of anxiety and depression, diabetes mellitus, fibromyalgia, spinal stenosis, chronic pain, COPD, recurrent pneumonia, and atrial fibrillation admitted for SOB. She was found to be hypoxic and CT chest showed multifocal pneumonia.   Assessment & Plan    Assessment and Plan:  Acute respiratory failure with hypoxia from multifocal pneumonia and COPD exacerbation:  Recommend another day of IV antibiotics and transition to oral antibiotics in am.  [place] oxygen to keep sats greater than 90%.  Covid PCR is negative.  Respiratory panel is negative.  Influenza panel is negative.  Follow up blood cultures.  Sputum cultures ordered today and pending.  Continue with dulera and bronchodilators prn.  Urine for strep pneumoniae is negative and legionella antigen is negative.  Check ambulating oxygen levels in am.  Improving leukocytosis. Recheck cbc in am.  Trend pro calcitonin.   Atrial fibrillation:  Rate controlled.   Fibromyalgia; Resume home meds.    Anxiety and depression:  Resume home meds.   Hypothyroidism;  Resume thyroid [place].    Hypokalemia:  Replaced.     Mild acute on Chronic diastolic heart failure:  Elevated BNP. Last echo from 2019 showing grade  2 diastolic dysfunction.  Start her on IV lasix 40 mg daily ,  Check daily weights and strict intake and output.  Troponins are negative and she denies any chest pain.  Last echocardiogram from 01/2022 show moderate diastolic dysfunction , while LVEF is preserved.      Estimated body mass index is 32.44 kg/m as calculated from the following:   Height as of this encounter: '5\' 6"'$   (1.676 m).   Weight as of this encounter: 91.2 kg.  Code Status: full code.  DVT Prophylaxis:  enoxaparin (LOVENOX) injection 40 mg Start: 05/10/22 2300 SCDs Start: 05/10/22 2209   Level of Care: Level of care: Progressive Family Communication: none at bedside.   Disposition Plan:     Remains inpatient appropriate:  IV antibiotics.   Procedures:   none Consultants:   None.   Antimicrobials:   Anti-infectives (From admission, onward)    Start     Dose/Rate Route Frequency Ordered Stop   05/12/22 1315  fluconazole (DIFLUCAN) tablet 200 mg        200 mg Oral  Once 05/12/22 1226 05/12/22 1341   05/10/22 1800  azithromycin (ZITHROMAX) 500 mg in sodium chloride 0.9 % 250 mL IVPB  Status:  Discontinued        500 mg 250 mL/hr over 60 Minutes Intravenous Every 24 hours 05/10/22 1548 05/10/22 2303   05/10/22 1630  ceFEPIme (MAXIPIME) 2 g in sodium chloride 0.9 % 100 mL IVPB        2 g 200 mL/hr over 30 Minutes Intravenous Every 8 hours 05/10/22  1615     05/10/22 1600  cefTRIAXone (ROCEPHIN) 1 g in sodium chloride 0.9 % 100 mL IVPB  Status:  Discontinued        1 g 200 mL/hr over 30 Minutes Intravenous Every 24 hours 05/10/22 1548 05/10/22 1612   05/10/22 1500  doxycycline (VIBRAMYCIN) 100 mg in sodium chloride 0.9 % 250 mL IVPB        100 mg 125 mL/hr over 120 Minutes Intravenous  Once 05/10/22 1453 05/10/22 1733        Medications  Scheduled Meds:  buPROPion  300 mg Oral Daily   enoxaparin (LOVENOX) injection  40 mg Subcutaneous Q24H   ezetimibe  10 mg Oral Daily   FLUoxetine  20 mg Oral Daily   furosemide  40 mg Intravenous Daily   levalbuterol  0.63 mg Nebulization TID   And   ipratropium  0.5 mg Nebulization TID   loratadine  10 mg Oral Daily   metoprolol tartrate  25 mg Oral BID   mometasone-formoterol  2 puff Inhalation BID   montelukast  10 mg Oral QHS   nystatin  5 mL Oral QID   pantoprazole  40 mg Oral BID   predniSONE  40 mg Oral QAC breakfast    pregabalin  75 mg Oral TID   progesterone  300 mg Oral QPM   thyroid  120 mg Oral QAC breakfast   And   thyroid  30 mg Oral Q lunch   Continuous Infusions:  ceFEPime (MAXIPIME) IV 2 g (05/13/22 0828)   PRN Meds:.acetaminophen **OR** acetaminophen, calcium carbonate, guaiFENesin, hydrOXYzine, levalbuterol, oxyCODONE, sodium chloride, tiZANidine    Subjective:   Taylor [name] was seen and examined today.  She reports feeling better. No chest pain. Wheezing has improved.   Objective:   Vitals:   05/13/22 1241 05/13/22 1249 05/13/22 1334 05/13/22 1353  BP: (!) 123/57     Pulse: 66     Resp: '17 20 17   '$ Temp: 97.8 F (36.6 C)     TempSrc: Oral     SpO2: 96%   92%  Weight:      Height:        Intake/Output Summary (Last 24 hours) at 05/13/2022 1452 Last data filed at 05/12/2022 1500 Gross per 24 hour  Intake 100.06 ml  Output --  Net 100.06 ml    Filed Weights   05/10/22 1242  Weight: 91.2 kg     Exam General exam: Appears calm and comfortable  Respiratory system: Clear to auscultation. Respiratory effort normal. Cardiovascular system: S1 & S2 heard, RRR. No JVD,  No pedal edema. Gastrointestinal system: Abdomen is nondistended, soft and nontender.  Central nervous system: Alert and oriented. No focal neurological deficits. Extremities: Symmetric 5 x 5 power. Skin: No rashes, lesions or ulcers Psychiatry: Mood & affect appropriate.       Data Reviewed:  I have personally reviewed following labs and imaging studies   CBC Lab Results  Component Value Date   WBC 14.7 (H) 05/12/2022   RBC 4.05 05/12/2022   HGB 12.2 05/12/2022   HCT 38.4 05/12/2022   MCV 94.8 05/12/2022   MCH 30.1 05/12/2022   PLT 264 05/12/2022   MCHC 31.8 05/12/2022   RDW 13.6 05/12/2022   LYMPHSABS 0.8 05/12/2022   MONOABS 0.7 05/12/2022   EOSABS 0.0 05/12/2022   BASOSABS 0.0 36/64/4034     Last metabolic panel Lab Results  Component Value Date   NA 140 05/13/2022   K 3.3 (L)  05/13/2022   CL 107 05/13/2022   CO2 25 05/13/2022   BUN 32 (H) 05/13/2022   CREATININE 0.91 05/13/2022   GLUCOSE 119 (H) 05/13/2022   GFRNONAA >60 05/13/2022   GFRAA 60 (L) 09/21/2019   CALCIUM 8.4 (L) 05/13/2022   PHOS 2.5 07/22/2013   PROT 6.2 07/23/2013   ALBUMIN 2.7 (L) 07/23/2013   BILITOT 0.5 07/23/2013   ALKPHOS 89 07/23/2013   AST 21 07/23/2013   ALT 23 07/23/2013   ANIONGAP 8 05/13/2022    CBG (last 3)  No results for input(s): "GLUCAP" in the last 72 hours.    Coagulation Profile: No results for input(s): "INR", "PROTIME" in the last 168 hours.   Radiology Studies: No results found.     Hosie Poisson M.D. [facility] 05/13/2022, 2:52 PM  Available via Epic secure chat 7am-7pm After 7 pm, please refer to night coverage provider listed on amion.

## 2022-05-13 NOTE — Plan of Care (Signed)
Patient sitting up in chair, discuss respiratory symptoms and oxygen need.  Anxiety reduced  pleasant conversation.  Sitting in chair with personal items within reach tv on.

## 2022-05-14 ENCOUNTER — Encounter: Payer: Self-pay | Admitting: Internal Medicine

## 2022-05-14 DIAGNOSIS — J441 Chronic obstructive pulmonary disease with (acute) exacerbation: Secondary | ICD-10-CM | POA: Diagnosis not present

## 2022-05-14 DIAGNOSIS — K219 Gastro-esophageal reflux disease without esophagitis: Secondary | ICD-10-CM

## 2022-05-14 DIAGNOSIS — J9601 Acute respiratory failure with hypoxia: Secondary | ICD-10-CM | POA: Diagnosis not present

## 2022-05-14 DIAGNOSIS — J189 Pneumonia, unspecified organism: Secondary | ICD-10-CM | POA: Diagnosis not present

## 2022-05-14 DIAGNOSIS — I10 Essential (primary) hypertension: Secondary | ICD-10-CM | POA: Diagnosis not present

## 2022-05-14 LAB — BASIC METABOLIC PANEL
Anion gap: 9 (ref 5–15)
BUN: 23 mg/dL (ref 8–23)
CO2: 25 mmol/L (ref 22–32)
Calcium: 8.2 mg/dL — ABNORMAL LOW (ref 8.9–10.3)
Chloride: 105 mmol/L (ref 98–111)
Creatinine, Ser: 0.83 mg/dL (ref 0.44–1.00)
GFR, Estimated: >60 mL/min (ref >60)
Glucose, Bld: 106 mg/dL — ABNORMAL HIGH (ref 70–99)
Potassium: 3.5 mmol/L (ref 3.5–5.1)
Sodium: 139 mmol/L (ref 135–145)

## 2022-05-14 MED ORDER — ALBUTEROL SULFATE (2.5 MG/3ML) 0.083% IN NEBU: 2.5000 mg | INHALATION_SOLUTION | RESPIRATORY_TRACT | 2 refills | Status: DC | PRN

## 2022-05-14 MED ORDER — PREDNISONE 10 MG PO TABS: ORAL_TABLET | ORAL | 0 refills | Status: DC

## 2022-05-14 MED ORDER — PANTOPRAZOLE SODIUM 40 MG PO TBEC: 40.0000 mg | DELAYED_RELEASE_TABLET | Freq: Two times a day (BID) | ORAL | 5 refills | Status: DC

## 2022-05-14 MED ORDER — CEFADROXIL 500 MG PO CAPS: 1000.0000 mg | ORAL_CAPSULE | Freq: Two times a day (BID) | ORAL | 0 refills | Status: AC

## 2022-05-14 MED ORDER — CEFADROXIL 500 MG PO CAPS: 1000.0000 mg | ORAL_CAPSULE | Freq: Two times a day (BID) | ORAL | 0 refills | Status: DC

## 2022-05-14 NOTE — Discharge Summary (Signed)
Physician Discharge Summary  Taylor [name] WEX:[id number] DOB: 1951/02/12 DOA: 05/10/2022  PCP: Martin[place], Julie M, NP  Admit date: 05/10/2022 Discharge date: 05/14/2022  Admitted From: Home Disposition: Home  Recommendations for Outpatient Follow-up:  Follow up with PCP in 1-2 weeks Please obtain BMP/CBC in one week Follow-up with cardiology in the next 1 to 2 weeks Follow-up with pulmonology in the next 1 to 2 weeks  Home Health: Equipment/Devices: Home oxygen at 2 L  Discharge Condition: Stable CODE STATUS: Full code Diet recommendation: Heart healthy  Brief/Interim Summary: 71 year old female with past medical history of anxiety and depression, diabetes mellitus, fibromyalgia, spinal stenosis, chronic pain, COPD, recurrent pneumonia, and atrial fibrillation admitted for SOB. She was found to be hypoxic and CT chest showed multifocal pneumonia.   Discharge Diagnoses:  Principal Problem:   Acute respiratory failure with hypoxia (HCC) Active Problems:   HTN (hypertension)   CAP (community acquired pneumonia)   Pleural effusion   OSA (obstructive sleep apnea)   GERD (gastroesophageal reflux disease)   Acute exacerbation of chronic obstructive pulmonary disease (COPD) (HCC)   Paroxysmal atrial fibrillation with RVR (HCC)  Acute respiratory failure with hypoxia from multifocal pneumonia and COPD exacerbation:  She was treated with IV antibiotics subsequently transition to oral to complete her course She does have residual hypoxia on discharge will be discharged with supplemental oxygen at home. Covid PCR is negative.  Respiratory viral panel is negative.  Influenza panel is negative.  Blood cultures not show any growth She was continued on pulmonary hygiene and bronchodilators Urine for strep pneumoniae is negative and legionella antigen is negative.  -She will be continued on prednisone taper, bronchodilators -I suspect that her GERD may be playing a role in recurrent  pneumonia/aspiration -We will be changing her PPI to twice daily as well as observing lifestyle changes including elevating head of bed, not eating for several hours prior to bedtime and small meals in order to help minimize risk of GERD/regurgitation   Atrial fibrillation:  -Reports having ablation procedure in the past -She was noted to have rapid atrial fibrillation on admission due to her acute illness, but has since converted back to sinus rhythm Heart rate is currently controlled. -Plans to follow-up with cardiology, may need to heart monitor to check for recurrence of atrial fibs -Would defer resumption of anticoagulation to primary cardiologist   Fibromyalgia; Resume home meds.      Anxiety and depression:  Resume home meds.    Hypothyroidism;  Resume thyroid Armour.      Hypokalemia:  Replaced.      Mild acute on Chronic diastolic heart failure:  Elevated BNP. Last echo from 2019 showing grade  2 diastolic dysfunction.  She was treated with intravenous Lasix and overall volume status has improved.  She has been transitioned back to oral Lasix. -Appears to be approaching euvolemia Troponins are negative and she denies any chest pain.  Last echocardiogram from 01/2022 show moderate diastolic dysfunction , while LVEF is preserved.  There was also mention of increased right ventricular systolic pressure up to 63 mmHg -Further management per primary cardiologist on follow-up  Obstructive sleep apnea -Reports compliance with CPAP   Discharge Instructions  Discharge Instructions     Diet - low sodium heart healthy   Complete by: As directed    Increase activity slowly   Complete by: As directed       Allergies as of 05/14/2022       Reactions   Avelox [moxifloxacin Hcl  In Nacl] Hives   Linezolid Rash   Venlafaxine Nausea Only, Rash   Rosuvastatin Other (See Comments)   myalgias   Cephalexin Itching, Rash   Clindamycin Diarrhea   Nitrofurantoin Itching, Rash    Other Rash   Brewer Advertising copywriter causes cracks in mouth stomach bloating    Penicillins Itching, Rash   Has patient had a PCN reaction causing anaphylaxis, immediate rash, facial/tongue/throat swelling, SOB or lightheadedness with hypotension? no Has patient had a PCN reaction causing severe rash involving mucus membranes or skin necrosis?  no Has patient had a PCN reaction that required hospitilization?no Has patient had a PCN reaction occurring within the last 10 years?  no If all of the above answers are "no" then may proceed with cephalosporin use.   Statins Other (See Comments)   Muscle pain        Medication List     TAKE these medications    acetaminophen 500 MG tablet Commonly known as: TYLENOL Take 1,000 mg by mouth every 6 (six) hours as needed for moderate pain or headache.   albuterol 108 (90 Base) MCG/ACT inhaler Commonly known as: VENTOLIN HFA Inhale 1 puff into the lungs every 6 (six) hours as needed for wheezing or shortness of breath. What changed: Another medication with the same name was added. Make sure you understand how and when to take each.   albuterol (2.5 MG/3ML) 0.083% nebulizer solution Commonly known as: PROVENTIL Take 3 mLs (2.5 mg total) by nebulization every 4 (four) hours as needed for wheezing or shortness of breath. What changed: You were already taking a medication with the same name, and this prescription was added. Make sure you understand how and when to take each.   buPROPion 150 MG 24 hr tablet Commonly known as: WELLBUTRIN XL Take 150 mg by mouth 2 (two) times daily.   cefadroxil 500 MG capsule Commonly known as: DURICEF Take 2 capsules (1,000 mg total) by mouth 2 (two) times daily for 3 days.   cetirizine 10 MG tablet Commonly known as: ZYRTEC Take 10 mg by mouth daily.   Cinnamon 500 MG Tabs Take 1,000 mg by mouth daily.   empagliflozin 10 MG Tabs tablet Commonly known as: JARDIANCE Take 1 tablet (10 mg total)  by mouth daily before breakfast.   ESTRADIOL TD Apply 1 mL topically daily. Estradiol 7.'5mg'$ /gm cream ( apply to thighs)   ezetimibe 10 MG tablet Commonly known as: ZETIA Take 1 tablet (10 mg total) by mouth daily.   Fish Oil 1000 MG Caps Take 2,000 mg by mouth daily.   FLUoxetine 20 MG tablet Commonly known as: PROZAC Take 40 mg by mouth daily.   Fluticasone-Salmeterol 500-50 MCG/DOSE Aepb Commonly known as: ADVAIR Inhale 1 puff into the lungs in the morning and at bedtime.   furosemide 40 MG tablet Commonly known as: LASIX Take 40 mg by mouth daily.   Guaifenesin 1200 MG Tb12 Take 1,200 mg by mouth daily.   hydrOXYzine 50 MG tablet Commonly known as: ATARAX Take 50 mg by mouth daily as needed for itching.   ketoconazole 2 % cream Commonly known as: NIZORAL Apply 1 application topically daily.   metoprolol tartrate 25 MG tablet Commonly known as: LOPRESSOR Take 1 tablet (25 mg total) by mouth 2 (two) times daily.   montelukast 10 MG tablet Commonly known as: SINGULAIR Take 10 mg by mouth at bedtime.   OIL OF OREGANO PO Take 1-2 capsules by mouth daily as needed (cold symptoms).  OVER THE COUNTER MEDICATION Take 2 tablets by mouth daily at 6 (six) AM. SACROMYCES bouliardi (probiotic)   Oxycodone HCl 10 MG Tabs Take 10 mg by mouth 5 (five) times daily as needed (pain).   pantoprazole 40 MG tablet Commonly known as: PROTONIX Take 1 tablet (40 mg total) by mouth 2 (two) times daily.   potassium chloride 10 MEQ tablet Commonly known as: Klor-Con M10 Take 1 tablet (10 mEq total) by mouth daily.   predniSONE 10 MG tablet Commonly known as: DELTASONE Take '40mg'$  po daily for 2 days then '30mg'$  daily for 2 days then '20mg'$  daily for 2 days then '10mg'$  daily for 2 days then stop Start taking on: May 15, 2022   pregabalin 100 MG capsule Commonly known as: LYRICA Take 100 mg by mouth 3 (three) times daily. What changed: Another medication with the same name was  removed. Continue taking this medication, and follow the directions you see here.   progesterone 100 MG capsule Commonly known as: PROMETRIUM Take 300 mg by mouth every evening.   pseudoephedrine 120 MG 12 hr tablet Commonly known as: SUDAFED Take 120 mg by mouth daily.   TESTOSTERONE TD Place 1-2 application onto the skin See admin instructions. Testosterone 2% cream. Apply 1 click's worth of testosterone cream on Sun, Tues, Thurs, and Sat. Apply 2 click's worth on Mon, Wed, and Fri   thyroid 60 MG tablet Commonly known as: ARMOUR Take 30-120 mg by mouth See admin instructions. Take 120 mg in the morning and 30 mg in the afternoon   tiZANidine 4 MG tablet Commonly known as: ZANAFLEX Take 2-4 mg by mouth 4 (four) times daily as needed for muscle spasms. 2 mg 3 times daily - 4 mg at bedtime   TURMERIC PO Take 1,440 mg by mouth every evening.   vitamin C 1000 MG tablet Take 1,000 mg by mouth daily.   VITAMIN K2-VITAMIN D3 PO Take 1 capsule by mouth every evening.               Durable Medical Equipment  (From admission, onward)           Start     Ordered   05/14/22 1625  For home use only DME oxygen  Once       Comments: Oxygen saturations down to 87% on RA while ambulating, improved to 95% with 2L oxygen  Question Answer Comment  Length of Need 6 Months   Mode or (Route) Nasal cannula   Liters per Minute 2   Frequency Continuous (stationary and portable oxygen unit needed)   Oxygen conserving device Yes   Oxygen delivery system Gas      05/14/22 1625            Allergies  Allergen Reactions   Avelox [Moxifloxacin Hcl In Nacl] Hives   Linezolid Rash   Venlafaxine Nausea Only and Rash   Rosuvastatin Other (See Comments)    myalgias   Cephalexin Itching and Rash   Clindamycin Diarrhea   Nitrofurantoin Itching and Rash   Other Rash    Brewer yeast and Bakers yeast causes cracks in mouth stomach bloating    Penicillins Itching and Rash    Has  patient had a PCN reaction causing anaphylaxis, immediate rash, facial/tongue/throat swelling, SOB or lightheadedness with hypotension? no Has patient had a PCN reaction causing severe rash involving mucus membranes or skin necrosis?  no Has patient had a PCN reaction that required hospitilization?no  Has patient had a PCN  reaction occurring within the last 10 years?  no If all of the above answers are "no" then may proceed with cephalosporin use.   Statins Other (See Comments)    Muscle pain    Consultations:    Procedures/Studies: CT Chest W Contrast  Result Date: 05/10/2022 CLINICAL DATA:  Respiratory illness. Cough and shortness of breath for 1 week. EXAM: CT CHEST WITH CONTRAST TECHNIQUE: Multidetector CT imaging of the chest was performed during intravenous contrast administration. RADIATION DOSE REDUCTION: This exam was performed according to the departmental dose-optimization program which includes automated exposure control, adjustment of the mA and/or kV according to patient size and/or use of iterative reconstruction technique. CONTRAST:  56m OMNIPAQUE IOHEXOL 300 MG/ML  SOLN COMPARISON:  April 16, 2022 FINDINGS: Cardiovascular: No significant vascular findings. Heart size is mildly enlarged. No pericardial effusion. Mediastinum/Nodes: Mediastinal and hilar lymphadenopathy, favor reactive lymph nodes. Trachea, esophagus and thyroid glands are unremarkable. Lungs/Pleura: Consolidations with increased pulmonary interstitium identified in bilateral upper lobes, bilateral lower lobes with relative sparing of the right middle lobe. 4 mm focal peripheral nodule in the right middle lobe unchanged. Small right pleural effusion. Upper Abdomen: No acute abnormality. 10.2 cm simple cyst of left kidney is in size, incompletely included. Musculoskeletal: Degenerative joint changes of the spine are noted. IMPRESSION: 1. Consolidations with increased pulmonary interstitium identified in bilateral  upper lobes, bilateral lower lobes with relative sparing of the right middle lobe. Findings are consistent with multifocal pneumonia. 2. Small right pleural effusion. 3. Mediastinal and hilar lymphadenopathy, favor reactive lymph nodes. 4. 10.2 cm simple cyst of left kidney, incompletely included. Electronically Signed   By: WAbelardo DieselM.D.   On: 05/10/2022 14:54   DG Chest Portable 1 View  Result Date: 05/10/2022 CLINICAL DATA:  Shortness of breath, cough EXAM: PORTABLE CHEST 1 VIEW COMPARISON:  Previous studies including chest radiograph done on 01/21/2022 and CT chest done on 04/16/2022 FINDINGS: Transverse diameter of heart is increased. Central pulmonary vessels are prominent. Increased interstitial and alveolar markings are seen in right upper lung fields and both lower lung fields. There is elevation of minor fissure on the right side suggesting decreased volume in right upper lobe. There is no significant pleural effusion. There is no pneumothorax. IMPRESSION: Cardiomegaly. Infiltrates are seen in right upper lobe and both lower lung fields suggesting multifocal pneumonia. Part of this finding may suggest underlying scarring or pulmonary edema. Electronically Signed   By: PElmer PickerM.D.   On: 05/10/2022 13:09   CT Chest Wo Contrast  Result Date: 04/23/2022 CLINICAL DATA:  Recurrent pneumonia EXAM: CT CHEST WITHOUT CONTRAST TECHNIQUE: Multidetector CT imaging of the chest was performed following the standard protocol without IV contrast. RADIATION DOSE REDUCTION: This exam was performed according to the departmental dose-optimization program which includes automated exposure control, adjustment of the mA and/or kV according to patient size and/or use of iterative reconstruction technique. COMPARISON:  Multiple priors, most recent lung cancer screening CT dated January 18th 2023 FINDINGS: Cardiovascular: Mild cardiomegaly. No pericardial effusion. Normal caliber thoracic aorta with mild  atherosclerotic disease. Mediastinum/Nodes: Esophagus thyroid are unremarkable. No pathologically enlarged lymph nodes seen in the chest. Lungs/Pleura: Central airways are patent. Mild centrilobular emphysema. Clustered peribronchovascular cysts of the left upper lobe is similar to prior exams and may be due to cystic bronchiectasis. Interval decreased bilateral patchy upper lung predominant ground-glass opacities. No new opacities, no pleural effusion or pneumothorax. Nodular linear opacity of the left lower lobe measuring 12 x 6 mm on  series 4, image 99 is unchanged when compared with priors and likely due to scarring or atelectasis. Peripheral opacity of the right lower lobe located on series 4, image 64 is unchanged compared with most recent prior and likely due to scarring additional smaller bilateral solid pulmonary nodules are stable. Reference solid nodule of the left lower lobe measuring 4 mm on series 4, image 119. Upper Abdomen: No acute abnormality. Musculoskeletal: No chest wall mass or suspicious bone lesions identified. IMPRESSION: 1. Interval decreased bilateral patchy upper lung predominant ground-glass opacities. When compared with multiple prior exams, patient has a history of recurrent lung opacities of varying morphology, findings may be due to repeat infection or inflammatory process such as eosinophilic pneumonia or vasculitis. 2. Bilateral solid pulmonary nodules are stable. Recommend attention on annual lung cancer screening CT. 3. Aortic Atherosclerosis (ICD10-I70.0) and Emphysema (ICD10-J43.9). Electronically Signed   By: Yetta Glassman M.D.   On: 04/23/2022 10:37      Subjective: She says she is feeling better.  Still mildly short of breath on time with exertion, but feels as though she is recovering.  Does not have much of a cough at this point.  Discharge Exam: Vitals:   05/13/22 2251 05/14/22 0607 05/14/22 0800 05/14/22 1222  BP: (!) 143/65 (!) 149/64  133/65  Pulse: 70 64   62  Resp: '18 18  16  '$ Temp: 98.3 F (36.8 C) 99 F (37.2 C)  98.1 F (36.7 C)  TempSrc: Oral Oral  Oral  SpO2: 94% 96% 96% 95%  Weight:      Height:        General: Pt is alert, awake, not in acute distress Cardiovascular: RRR, S1/S2 +, no rubs, no gallops Respiratory: CTA bilaterally, no wheezing, no rhonchi Abdominal: Soft, NT, ND, bowel sounds + Extremities: no edema, no cyanosis    The results of significant diagnostics from this hospitalization (including imaging, microbiology, ancillary and laboratory) are listed below for reference.     Microbiology: Recent Results (from the past 240 hour(s))  Resp Panel by RT-PCR (Flu A&B, Covid) Anterior Nasal Swab     Status: None   Collection Time: 05/10/22  1:04 PM   Specimen: Anterior Nasal Swab  Result Value Ref Range Status   SARS Coronavirus 2 by RT PCR NEGATIVE NEGATIVE Final    Comment: (NOTE) SARS-CoV-2 target nucleic acids are NOT DETECTED.  The SARS-CoV-2 RNA is generally detectable in upper respiratory specimens during the acute phase of infection. The lowest concentration of SARS-CoV-2 viral copies this assay can detect is 138 copies/mL. A negative result does not preclude SARS-Cov-2 infection and should not be used as the sole basis for treatment or other patient management decisions. A negative result may occur with  improper specimen collection/handling, submission of specimen other than nasopharyngeal swab, presence of viral mutation(s) within the areas targeted by this assay, and inadequate number of viral copies(<138 copies/mL). A negative result must be combined with clinical observations, patient history, and epidemiological information. The expected result is Negative.  Fact Sheet for Patients:  EntrepreneurPulse.com.au  Fact Sheet for Healthcare Providers:  IncredibleEmployment.be  This test is no t yet approved or cleared by the [place] FDA and  has been  authorized for detection and/or diagnosis of SARS-CoV-2 by FDA under an Emergency Use Authorization (EUA). This EUA will remain  in effect (meaning this test can be used) for the duration of the COVID-19 declaration under Section 564(b)(1) of the Act, 21 U.S.C.section 360bbb-3(b)(1), unless the authorization is  terminated  or revoked sooner.       Influenza A by PCR NEGATIVE NEGATIVE Final   Influenza B by PCR NEGATIVE NEGATIVE Final    Comment: (NOTE) The Xpert Xpress SARS-CoV-2/FLU/RSV plus assay is intended as an aid in the diagnosis of influenza from Nasopharyngeal swab specimens and should not be used as a sole basis for treatment. Nasal washings and aspirates are unacceptable for Xpert Xpress SARS-CoV-2/FLU/RSV testing.  Fact Sheet for Patients: EntrepreneurPulse.com.au  Fact Sheet for Healthcare Providers: IncredibleEmployment.be  This test is not yet approved or cleared by the [place] FDA and has been authorized for detection and/or diagnosis of SARS-CoV-2 by FDA under an Emergency Use Authorization (EUA). This EUA will remain in effect (meaning this test can be used) for the duration of the COVID-19 declaration under Section 564(b)(1) of the Act, 21 U.S.C. section 360bbb-3(b)(1), unless the authorization is terminated or revoked.  Performed at [facility], [address] [place], [place] [address]   Respiratory (~20 pathogens) panel by PCR     Status: None   Collection Time: 05/10/22  4:01 PM   Specimen: Nasopharyngeal Swab; Respiratory  Result Value Ref Range Status   Adenovirus NOT DETECTED NOT DETECTED Final   Coronavirus 229E NOT DETECTED NOT DETECTED Final    Comment: (NOTE) The Coronavirus on the Respiratory Panel, DOES NOT test for the novel  Coronavirus (2019 nCoV)    Coronavirus HKU1 NOT DETECTED NOT DETECTED Final   Coronavirus NL63 NOT DETECTED NOT DETECTED Final   Coronavirus OC43 NOT DETECTED  NOT DETECTED Final   Metapneumovirus NOT DETECTED NOT DETECTED Final   Rhinovirus / Enterovirus NOT DETECTED NOT DETECTED Final   Influenza A NOT DETECTED NOT DETECTED Final   Influenza B NOT DETECTED NOT DETECTED Final   Parainfluenza Virus 1 NOT DETECTED NOT DETECTED Final   Parainfluenza Virus 2 NOT DETECTED NOT DETECTED Final   Parainfluenza Virus 3 NOT DETECTED NOT DETECTED Final   Parainfluenza Virus 4 NOT DETECTED NOT DETECTED Final   Respiratory Syncytial Virus NOT DETECTED NOT DETECTED Final   Bordetella pertussis NOT DETECTED NOT DETECTED Final   Bordetella Parapertussis NOT DETECTED NOT DETECTED Final   Chlamydophila pneumoniae NOT DETECTED NOT DETECTED Final   Mycoplasma pneumoniae NOT DETECTED NOT DETECTED Final    Comment: Performed at [facility], [place]. [address], [place], [place] [address]  MRSA Next Gen by PCR, Nasal     Status: None   Collection Time: 05/11/22 12:27 AM   Specimen: Nasal Mucosa; Nasal Swab  Result Value Ref Range Status   MRSA by PCR Next Gen NOT DETECTED NOT DETECTED Final    Comment: (NOTE) The GeneXpert MRSA Assay (FDA approved for NASAL specimens only), is one component of a comprehensive MRSA colonization surveillance program. It is not intended to diagnose MRSA infection nor to guide or monitor treatment for MRSA infections. Test performance is not FDA approved in patients less than 59 years old. Performed at [facility], [place] [address] [place], [place] [address]      Labs: BNP (last 3 results) Recent Labs    05/10/22 1307  BNP 294.7*   Basic Metabolic Panel: Recent Labs  Lab 05/10/22 1307 05/11/22 0327 05/12/22 0333 05/13/22 0401 05/14/22 0403  NA 134* 138 139 140 139  K 3.4* 3.6 3.7 3.3* 3.5  CL 101 106 108 107 105  CO2 '23 22 23 25 25  '$ GLUCOSE 93 148* 169* 119* 106*  BUN 20  22 25* 32* 23  CREATININE 0.96 0.96 0.92 0.91 0.83  CALCIUM 8.6* 8.5* 9.0 8.4* 8.2*  MG  --  2.5* 2.5*  --   --    Liver  Function Tests: No results for input(s): "AST", "ALT", "ALKPHOS", "BILITOT", "PROT", "ALBUMIN" in the last 168 hours. No results for input(s): "LIPASE", "AMYLASE" in the last 168 hours. No results for input(s): "AMMONIA" in the last 168 hours. CBC: Recent Labs  Lab 05/10/22 1307 05/11/22 0327 05/12/22 0333  WBC 16.4* 11.8* 14.7*  NEUTROABS 13.9*  --  13.0*  HGB 12.5 11.9* 12.2  HCT 39.1 37.0 38.4  MCV 94.0 94.4 94.8  PLT 193 258 264   Cardiac Enzymes: No results for input(s): "CKTOTAL", "CKMB", "CKMBINDEX", "TROPONINI" in the last 168 hours. BNP: Invalid input(s): "POCBNP" CBG: No results for input(s): "GLUCAP" in the last 168 hours. D-Dimer No results for input(s): "DDIMER" in the last 72 hours. Hgb A1c No results for input(s): "HGBA1C" in the last 72 hours. Lipid Profile No results for input(s): "CHOL", "HDL", "LDLCALC", "TRIG", "CHOLHDL", "LDLDIRECT" in the last 72 hours. Thyroid function studies No results for input(s): "TSH", "T4TOTAL", "T3FREE", "THYROIDAB" in the last 72 hours.  Invalid input(s): "FREET3" Anemia work up No results for input(s): "VITAMINB12", "FOLATE", "FERRITIN", "TIBC", "IRON", "RETICCTPCT" in the last 72 hours. Urinalysis    Component Value Date/Time   COLORURINE AMBER (A) 07/22/2013 1227   APPEARANCEUR CLOUDY (A) 07/22/2013 1227   LABSPEC 1.020 07/22/2013 1227   PHURINE 5.0 07/22/2013 1227   GLUCOSEU NEGATIVE 07/22/2013 1227   HGBUR NEGATIVE 07/22/2013 1227   BILIRUBINUR NEGATIVE 07/22/2013 1227   KETONESUR 15 (A) 07/22/2013 1227   PROTEINUR NEGATIVE 07/22/2013 1227   UROBILINOGEN 0.2 07/22/2013 1227   NITRITE NEGATIVE 07/22/2013 1227   LEUKOCYTESUR MODERATE (A) 07/22/2013 1227   Sepsis Labs Recent Labs  Lab 05/10/22 1307 05/11/22 0327 05/12/22 0333  WBC 16.4* 11.8* 14.7*   Microbiology Recent Results (from the past 240 hour(s))  Resp Panel by RT-PCR (Flu A&B, Covid) Anterior Nasal Swab     Status: None   Collection Time:  05/10/22  1:04 PM   Specimen: Anterior Nasal Swab  Result Value Ref Range Status   SARS Coronavirus 2 by RT PCR NEGATIVE NEGATIVE Final    Comment: (NOTE) SARS-CoV-2 target nucleic acids are NOT DETECTED.  The SARS-CoV-2 RNA is generally detectable in upper respiratory specimens during the acute phase of infection. The lowest concentration of SARS-CoV-2 viral copies this assay can detect is 138 copies/mL. A negative result does not preclude SARS-Cov-2 infection and should not be used as the sole basis for treatment or other patient management decisions. A negative result may occur with  improper specimen collection/handling, submission of specimen other than nasopharyngeal swab, presence of viral mutation(s) within the areas targeted by this assay, and inadequate number of viral copies(<138 copies/mL). A negative result must be combined with clinical observations, patient history, and epidemiological information. The expected result is Negative.  Fact Sheet for Patients:  EntrepreneurPulse.com.au  Fact Sheet for Healthcare Providers:  IncredibleEmployment.be  This test is no t yet approved or cleared by the [place] FDA and  has been authorized for detection and/or diagnosis of SARS-CoV-2 by FDA under an Emergency Use Authorization (EUA). This EUA will remain  in effect (meaning this test can be used) for the duration of the COVID-19 declaration under Section 564(b)(1) of the Act, 21 U.S.C.section 360bbb-3(b)(1), unless the authorization is terminated  or revoked sooner.  Influenza A by PCR NEGATIVE NEGATIVE Final   Influenza B by PCR NEGATIVE NEGATIVE Final    Comment: (NOTE) The Xpert Xpress SARS-CoV-2/FLU/RSV plus assay is intended as an aid in the diagnosis of influenza from Nasopharyngeal swab specimens and should not be used as a sole basis for treatment. Nasal washings and aspirates are unacceptable for Xpert Xpress  SARS-CoV-2/FLU/RSV testing.  Fact Sheet for Patients: EntrepreneurPulse.com.au  Fact Sheet for Healthcare Providers: IncredibleEmployment.be  This test is not yet approved or cleared by the [place] FDA and has been authorized for detection and/or diagnosis of SARS-CoV-2 by FDA under an Emergency Use Authorization (EUA). This EUA will remain in effect (meaning this test can be used) for the duration of the COVID-19 declaration under Section 564(b)(1) of the Act, 21 U.S.C. section 360bbb-3(b)(1), unless the authorization is terminated or revoked.  Performed at [facility], [address] [place], [place] [address]   Respiratory (~20 pathogens) panel by PCR     Status: None   Collection Time: 05/10/22  4:01 PM   Specimen: Nasopharyngeal Swab; Respiratory  Result Value Ref Range Status   Adenovirus NOT DETECTED NOT DETECTED Final   Coronavirus 229E NOT DETECTED NOT DETECTED Final    Comment: (NOTE) The Coronavirus on the Respiratory Panel, DOES NOT test for the novel  Coronavirus (2019 nCoV)    Coronavirus HKU1 NOT DETECTED NOT DETECTED Final   Coronavirus NL63 NOT DETECTED NOT DETECTED Final   Coronavirus OC43 NOT DETECTED NOT DETECTED Final   Metapneumovirus NOT DETECTED NOT DETECTED Final   Rhinovirus / Enterovirus NOT DETECTED NOT DETECTED Final   Influenza A NOT DETECTED NOT DETECTED Final   Influenza B NOT DETECTED NOT DETECTED Final   Parainfluenza Virus 1 NOT DETECTED NOT DETECTED Final   Parainfluenza Virus 2 NOT DETECTED NOT DETECTED Final   Parainfluenza Virus 3 NOT DETECTED NOT DETECTED Final   Parainfluenza Virus 4 NOT DETECTED NOT DETECTED Final   Respiratory Syncytial Virus NOT DETECTED NOT DETECTED Final   Bordetella pertussis NOT DETECTED NOT DETECTED Final   Bordetella Parapertussis NOT DETECTED NOT DETECTED Final   Chlamydophila pneumoniae NOT DETECTED NOT DETECTED Final   Mycoplasma pneumoniae NOT  DETECTED NOT DETECTED Final    Comment: Performed at [facility], [place]. [address], [place], [place] [address]  MRSA Next Gen by PCR, Nasal     Status: None   Collection Time: 05/11/22 12:27 AM   Specimen: Nasal Mucosa; Nasal Swab  Result Value Ref Range Status   MRSA by PCR Next Gen NOT DETECTED NOT DETECTED Final    Comment: (NOTE) The GeneXpert MRSA Assay (FDA approved for NASAL specimens only), is one component of a comprehensive MRSA colonization surveillance program. It is not intended to diagnose MRSA infection nor to guide or monitor treatment for MRSA infections. Test performance is not FDA approved in patients less than 17 years old. Performed at [facility], [place] [address], [place], [place] [address]      Time coordinating discharge: 25mns  SIGNED:   JKathie Dike MD  [facility] 05/14/2022, 9:21 PM   If 7PM-7AM, please contact night-coverage [email or web address]

## 2022-05-14 NOTE — Progress Notes (Signed)
SATURATION QUALIFICATIONS: (This note is used to comply with regulatory documentation for home oxygen)  Patient Saturations on Room Air at Rest = 92%  Patient Saturations on Room Air while Ambulating = 87%  Patient Saturations on 2 Liters of oxygen while Ambulating = 95%  Please briefly explain why patient needs home oxygen: Pt will require home oxygen at 2L [place] to keep oxygen saturations above 88%.

## 2022-05-14 NOTE — Plan of Care (Signed)

## 2022-05-14 NOTE — Telephone Encounter (Signed)
Patient called in from hospital Admitted with SOB, hypoxia     Has been transiently in afib wih rapid rates  She had some diastoic heart failure, probably because of low oxygen , possible infection, COPD.     All of these things make her feel bad, make things worse  It was not just one thing       Sounds like she is feeling better though oxygen is still low at imes   Looking ino home oxygen  WIll need close follow up as outpt   week or two after discharge     Possible oupt monior to see if in/out of afib athome

## 2022-05-14 NOTE — Progress Notes (Signed)
Patient was ambulated by staff and noted to have hypoxia down to 87% on room air while ambulating. Oxygen was applied via nasal cannula at 2L with improvement of saturations to 95%. Patient qualifies for home oxygen and would benefit from being discharged with oxygen.  [employer]

## 2022-05-14 NOTE — Progress Notes (Signed)
Mobility Specialist - Progress Note   05/14/22 1156  Mobility  Activity Ambulated independently in hallway  Level of Assistance Modified independent, requires aide device or extra time  Assistive Device None  Distance Ambulated (ft) 150 ft  Range of Motion/Exercises Active  Activity Response Tolerated well  Mobility Referral Yes  $Mobility charge 1 Mobility   Pt was found on recliner chair and agreeable to ambulate. Stated that she doesn't typically walk long distances and had no complaints during session. At EOS returned to recliner chair with necessities in reach and RN notified of session.  Ferd Hibbs Mobility Specialist

## 2022-05-14 NOTE — Plan of Care (Signed)
  Problem: Health Behavior/Discharge Planning: Goal: Ability to manage health-related needs will improve Outcome: Progressing   Problem: Clinical Measurements: Goal: Ability to maintain clinical measurements within normal limits will improve Outcome: Progressing Goal: Will remain free from infection Outcome: Progressing Goal: Diagnostic test results will improve Outcome: Progressing Goal: Respiratory complications will improve Outcome: Progressing   Problem: Clinical Measurements: Goal: Ability to maintain a body temperature in the normal range will improve Outcome: Progressing   Problem: Respiratory: Goal: Ability to maintain adequate ventilation will improve Outcome: Progressing Goal: Ability to maintain a clear airway will improve Outcome: Progressing   Problem: Education: Goal: Knowledge of General Education information will improve Description: Including pain rating scale, medication(s)/side effects and non-pharmacologic comfort measures Outcome: Adequate for Discharge   Problem: Clinical Measurements: Goal: Cardiovascular complication will be avoided Outcome: Adequate for Discharge   Problem: Activity: Goal: Risk for activity intolerance will decrease Outcome: Adequate for Discharge   Problem: Nutrition: Goal: Adequate nutrition will be maintained Outcome: Adequate for Discharge   Problem: Coping: Goal: Level of anxiety will decrease Outcome: Adequate for Discharge   Problem: Elimination: Goal: Will not experience complications related to bowel motility Outcome: Adequate for Discharge Goal: Will not experience complications related to urinary retention Outcome: Adequate for Discharge   Problem: Pain Managment: Goal: General experience of comfort will improve Outcome: Adequate for Discharge   Problem: Safety: Goal: Ability to remain free from injury will improve Outcome: Adequate for Discharge   Problem: Skin Integrity: Goal: Risk for impaired skin  integrity will decrease Outcome: Adequate for Discharge   Problem: Activity: Goal: Ability to tolerate increased activity will improve Outcome: Adequate for Discharge

## 2022-05-15 NOTE — Telephone Encounter (Signed)
I spoke with the pt.. she is home from the Hospital... she will see Dr Harrington Challenger 05/26/22... she sees Pulmoanry 05/16/22.   She was sent home on 2 liters of continuous O2.   I will forward to Dr Harrington Challenger to be sure her appt is not too far off.   Pt will call if she needs anything prior to her OV.

## 2022-05-16 ENCOUNTER — Ambulatory Visit (INDEPENDENT_AMBULATORY_CARE_PROVIDER_SITE_OTHER): Payer: PPO | Admitting: Primary Care

## 2022-05-16 ENCOUNTER — Ambulatory Visit (INDEPENDENT_AMBULATORY_CARE_PROVIDER_SITE_OTHER): Payer: PPO

## 2022-05-16 ENCOUNTER — Encounter: Payer: Self-pay | Admitting: Primary Care

## 2022-05-16 VITALS — BP 122/64 | HR 72 | Temp 97.7°F | Ht 66.0 in | Wt 191.8 lb

## 2022-05-16 DIAGNOSIS — J189 Pneumonia, unspecified organism: Secondary | ICD-10-CM | POA: Diagnosis not present

## 2022-05-16 DIAGNOSIS — J449 Chronic obstructive pulmonary disease, unspecified: Secondary | ICD-10-CM

## 2022-05-16 DIAGNOSIS — J9601 Acute respiratory failure with hypoxia: Secondary | ICD-10-CM

## 2022-05-16 NOTE — Progress Notes (Unsigned)
$'@Patient'j$  ID: Taylor [name], female    DOB: Apr 23, 1951, 71 y.o.   MRN: [id number]  No chief complaint on file.   Referring provider: [place], Julie M, NP  HPI: 71 year old female, former smoker quit in 2009 (40-pack-year history).  Past medical history significant for pretension, A-fib, COPD, recurrent pneumonia, acute respiratory failure with hypoxia.  Patient of Dr. Lamonte Sakai.  Hospitalization 05/10/22-05/14/22 Brief/Interim Summary: 71 year old female with past medical history of anxiety and depression, diabetes mellitus, fibromyalgia, spinal stenosis, chronic pain, COPD, recurrent pneumonia, and atrial fibrillation admitted for SOB. She was found to be hypoxic and CT chest showed multifocal pneumonia.   Acute respiratory failure with hypoxia from multifocal pneumonia and COPD exacerbation:  She was treated with IV antibiotics subsequently transition to oral to complete her course She does have residual hypoxia on discharge will be discharged with supplemental oxygen at home. Covid PCR is negative.  Respiratory viral panel is negative.  Influenza panel is negative.  Blood cultures not show any growth She was continued on pulmonary hygiene and bronchodilators Urine for strep pneumoniae is negative and legionella antigen is negative.  -She will be continued on prednisone taper, bronchodilators -I suspect that her GERD may be playing a role in recurrent pneumonia/aspiration -We will be changing her PPI to twice daily as well as observing lifestyle changes including elevating head of bed, not eating for several hours prior to bedtime and small meals in order to help minimize risk of GERD/regurgitation   Atrial fibrillation:  -Reports having ablation procedure in the past -She was noted to have rapid atrial fibrillation on admission due to her acute illness, but has since converted back to sinus rhythm Heart rate is currently controlled. -Plans to follow-up with cardiology, may need to heart  monitor to check for recurrence of atrial fibs -Would defer resumption of anticoagulation to primary cardiologist    05/16/2022  Patient was admitted for 4 days in December for acute respiratory failure secondary to multifocal pneumonia and COPD exacerbation, she was discharged on 05/14/22.   She was discharged on cefadroxil '500mg'$  BID x 3 days. She has one fay left of antibiotics. She continues on prednisone taper.   She is feels better. Energy level has not returned. Not coughing much. She has post nasal drip symptoms. GERD contributing to recurrent pneumonia/aspiration. She is taking Protonix twice daily. She is sleeping with her head elevated at night. She is getting up clear mucus. Cough is loosening up. Taking mucinex once a day. She is newly on oxygen, using 2L. She is wearing oxygen 24/7. When she is sitting levels staying > 90%. When exertion herself o2 level 88% RA. She was able to shower today.   Pulmonary function testing in November showed mild obstructive lung disease  Repeat CT chest in 6 months  She has an apt with cardiology    Allergies  Allergen Reactions   Avelox [Moxifloxacin Hcl In Nacl] Hives   Linezolid Rash   Venlafaxine Nausea Only and Rash   Rosuvastatin Other (See Comments)    myalgias   Cephalexin Itching and Rash   Clindamycin Diarrhea   Nitrofurantoin Itching and Rash   Other Rash    Brewer yeast and Bakers yeast causes cracks in mouth stomach bloating    Penicillins Itching and Rash    Has patient had a PCN reaction causing anaphylaxis, immediate rash, facial/tongue/throat swelling, SOB or lightheadedness with hypotension? no Has patient had a PCN reaction causing severe rash involving mucus membranes or skin necrosis?  no  Has patient had a PCN reaction that required hospitilization?no  Has patient had a PCN reaction occurring within the last 10 years?  no If all of the above answers are "no" then may proceed with cephalosporin use.   Statins Other  (See Comments)    Muscle pain    Immunization History  Administered Date(s) Administered   Covid-19, Mrna,Vaccine(Spikevax)33yr and older 03/29/2022   DTP 06/26/2005   Fluad Quad(high Dose 65+) 04/09/2022   Hepatitis A 06/20/2014, 12/19/2014   Hepatitis A, Ped/Adol-2 Dose 06/20/2014, 12/19/2014   Influenza, High Dose Seasonal PF 02/21/2018   Influenza,inj,Quad PF,6+ Mos 01/22/2018   Influenza,inj,quad, With Preservative 01/08/2019, 04/01/2019   Influenza-Unspecified 03/05/2012, 03/09/2013, 04/18/2013, 06/07/2014, 07/05/2015, 03/21/2016, 01/22/2018, 01/08/2019, 04/01/2019   PFIZER Comirnaty(Gray Top)Covid-19 Tri-Sucrose Vaccine 07/18/2019, 08/12/2019, 09/28/2020, 03/02/2021   PFIZER(Purple Top)SARS-COV-2 Vaccination 07/18/2019, 08/12/2019   Pneumococcal Conjugate-13 05/11/2018, 05/14/2021   Pneumococcal Polysaccharide-23 06/20/2014   Tdap 01/19/2012   Unspecified SARS-COV-2 Vaccination 07/18/2019, 08/12/2019   Zoster Recombinat (Shingrix) 09/23/2017, 01/22/2018   Zoster, Unspecified 09/23/2017, 01/22/2018    Past Medical History:  Diagnosis Date   Anxiety    Arthritis    "hands, neck, back" (07/22/2013)   Atrial fibrillation ([facility]    CHEST DISCOMFORT    Chronic back pain    "started in lower back; getting to be all over my back" (07/22/2013)   Chronic bronchitis (HCC)    "I've had it 2 years in a row; do have some trouble breathing at times; use an inhaler prn; not asthma" (07/22/2013)   Chronic neck pain    COPD (chronic obstructive pulmonary disease) (HCC)    DDD (degenerative disc disease), cervical    DDD (degenerative disc disease), lumbar    Depression    Dysrhythmia    GERD (gastroesophageal reflux disease)    Hepatitis 1971   "don't know if it was A or B" (07/22/2013)   HYPERLIPIDEMIA-MIXED 2005   "went away after I started taking fish oil" (07/22/2013)   Hypothyroidism    Palpitations    Pneumonia 2013   Type II diabetes mellitus ([facility]    "diet controlled"  (07/22/2013)    Tobacco History: Social History   Tobacco Use  Smoking Status Former   Packs/day: 1.00   Years: 40.00   Total pack years: 40.00   Types: Cigarettes   Quit date: 12/22/2007   Years since quitting: 14.4  Smokeless Tobacco Never   Counseling given: Not Answered   Outpatient Medications Prior to Visit  Medication Sig Dispense Refill   acetaminophen (TYLENOL) 500 MG tablet Take 1,000 mg by mouth every 6 (six) hours as needed for moderate pain or headache.      albuterol (PROVENTIL HFA;VENTOLIN HFA) 108 (90 BASE) MCG/ACT inhaler Inhale 1 puff into the lungs every 6 (six) hours as needed for wheezing or shortness of breath. 1 Inhaler 1   albuterol (PROVENTIL) (2.5 MG/3ML) 0.083% nebulizer solution Take 3 mLs (2.5 mg total) by nebulization every 4 (four) hours as needed for wheezing or shortness of breath. 75 mL 2   Ascorbic Acid (VITAMIN C) 1000 MG tablet Take 1,000 mg by mouth daily.     buPROPion (WELLBUTRIN XL) 150 MG 24 hr tablet Take 150 mg by mouth 2 (two) times daily.     cefadroxil (DURICEF) 500 MG capsule Take 2 capsules (1,000 mg total) by mouth 2 (two) times daily for 3 days. 12 capsule 0   cetirizine (ZYRTEC) 10 MG tablet Take 10 mg by mouth daily.  Cinnamon 500 MG TABS Take 1,000 mg by mouth daily.     empagliflozin (JARDIANCE) 10 MG TABS tablet Take 1 tablet (10 mg total) by mouth daily before breakfast. 30 tablet 11   ESTRADIOL TD Apply 1 mL topically daily. Estradiol 7.'5mg'$ /gm cream ( apply to thighs)     ezetimibe (ZETIA) 10 MG tablet Take 1 tablet (10 mg total) by mouth daily. 90 tablet 3   FLUoxetine (PROZAC) 20 MG tablet Take 40 mg by mouth daily.     Fluticasone-Salmeterol (ADVAIR) 500-50 MCG/DOSE AEPB Inhale 1 puff into the lungs in the morning and at bedtime.      furosemide (LASIX) 40 MG tablet Take 40 mg by mouth daily.     Guaifenesin 1200 MG TB12 Take 1,200 mg by mouth daily.     hydrOXYzine (ATARAX/VISTARIL) 50 MG tablet Take 50 mg by mouth  daily as needed for itching.      ketoconazole (NIZORAL) 2 % cream Apply 1 application topically daily.      metoprolol tartrate (LOPRESSOR) 25 MG tablet Take 1 tablet (25 mg total) by mouth 2 (two) times daily. 180 tablet 2   montelukast (SINGULAIR) 10 MG tablet Take 10 mg by mouth at bedtime.     OIL OF OREGANO PO Take 1-2 capsules by mouth daily as needed (cold symptoms).      Omega-3 Fatty Acids (FISH OIL) 1000 MG CAPS Take 2,000 mg by mouth daily.     OVER THE COUNTER MEDICATION Take 2 tablets by mouth daily at 6 (six) AM. SACROMYCES bouliardi (probiotic)     Oxycodone HCl 10 MG TABS Take 10 mg by mouth 5 (five) times daily as needed (pain).     pantoprazole (PROTONIX) 40 MG tablet Take 1 tablet (40 mg total) by mouth 2 (two) times daily. 60 tablet 5   potassium chloride (KLOR-CON M10) 10 MEQ tablet Take 1 tablet (10 mEq total) by mouth daily. 90 tablet 3   predniSONE (DELTASONE) 10 MG tablet Take '40mg'$  po daily for 2 days then '30mg'$  daily for 2 days then '20mg'$  daily for 2 days then '10mg'$  daily for 2 days then stop 20 tablet 0   pregabalin (LYRICA) 100 MG capsule Take 100 mg by mouth 3 (three) times daily.     progesterone (PROMETRIUM) 100 MG capsule Take 300 mg by mouth every evening.      pseudoephedrine (SUDAFED) 120 MG 12 hr tablet Take 120 mg by mouth daily.     TESTOSTERONE TD Place 1-2 application onto the skin See admin instructions. Testosterone 2% cream. Apply 1 click's worth of testosterone cream on Sun, Tues, Thurs, and Sat. Apply 2 click's worth on Mon, Wed, and Fri     thyroid (ARMOUR) 60 MG tablet Take 30-120 mg by mouth See admin instructions. Take 120 mg in the morning and 30 mg in the afternoon     tiZANidine (ZANAFLEX) 4 MG tablet Take 2-4 mg by mouth 4 (four) times daily as needed for muscle spasms. 2 mg 3 times daily - 4 mg at bedtime     TURMERIC PO Take 1,440 mg by mouth every evening.     Vitamin D-Vitamin K (VITAMIN K2-VITAMIN D3 PO) Take 1 capsule by mouth every evening.      No facility-administered medications prior to visit.      Review of Systems  Review of Systems  Constitutional: Negative.   HENT: Negative.    Respiratory:  Positive for cough.   Cardiovascular: Negative.  Physical Exam  There were no vitals taken for this visit. Physical Exam Constitutional:      Appearance: Normal appearance.  HENT:     Head: Normocephalic and atraumatic.  Cardiovascular:     Rate and Rhythm: Normal rate and regular rhythm.  Pulmonary:     Effort: Pulmonary effort is normal.     Breath sounds: Normal breath sounds. No wheezing.     Comments: Rhonchi right base  Skin:    General: Skin is warm and dry.  Neurological:     General: No focal deficit present.     Mental Status: She is alert and oriented to person, place, and time. Mental status is at baseline.  Psychiatric:        Mood and Affect: Mood normal.        Behavior: Behavior normal.        Thought Content: Thought content normal.        Judgment: Judgment normal.      Lab Results:  CBC    Component Value Date/Time   WBC 14.7 (H) 05/12/2022 0333   RBC 4.05 05/12/2022 0333   HGB 12.2 05/12/2022 0333   HGB 14.0 11/08/2018 1537   HCT 38.4 05/12/2022 0333   HCT 41.0 11/08/2018 1537   PLT 264 05/12/2022 0333   PLT 306 11/08/2018 1537   MCV 94.8 05/12/2022 0333   MCV 86 11/08/2018 1537   MCH 30.1 05/12/2022 0333   MCHC 31.8 05/12/2022 0333   RDW 13.6 05/12/2022 0333   RDW 13.1 11/08/2018 1537   LYMPHSABS 0.8 05/12/2022 0333   MONOABS 0.7 05/12/2022 0333   EOSABS 0.0 05/12/2022 0333   BASOSABS 0.0 05/12/2022 0333    BMET    Component Value Date/Time   NA 139 05/14/2022 0403   NA 138 05/07/2022 1217   K 3.5 05/14/2022 0403   CL 105 05/14/2022 0403   CO2 25 05/14/2022 0403   GLUCOSE 106 (H) 05/14/2022 0403   BUN 23 05/14/2022 0403   BUN 24 05/07/2022 1217   CREATININE 0.83 05/14/2022 0403   CALCIUM 8.2 (L) 05/14/2022 0403   GFRNONAA >60 05/14/2022 0403   GFRAA 60  (L) 09/21/2019 0708    BNP    Component Value Date/Time   BNP 292.9 (H) 05/10/2022 1307    ProBNP    Component Value Date/Time   PROBNP 587 (H) 05/07/2022 1217   PROBNP 1,142.0 (H) 07/24/2013 1440    Imaging: CT Chest W Contrast  Result Date: 05/10/2022 CLINICAL DATA:  Respiratory illness. Cough and shortness of breath for 1 week. EXAM: CT CHEST WITH CONTRAST TECHNIQUE: Multidetector CT imaging of the chest was performed during intravenous contrast administration. RADIATION DOSE REDUCTION: This exam was performed according to the departmental dose-optimization program which includes automated exposure control, adjustment of the mA and/or kV according to patient size and/or use of iterative reconstruction technique. CONTRAST:  9m OMNIPAQUE IOHEXOL 300 MG/ML  SOLN COMPARISON:  April 16, 2022 FINDINGS: Cardiovascular: No significant vascular findings. Heart size is mildly enlarged. No pericardial effusion. Mediastinum/Nodes: Mediastinal and hilar lymphadenopathy, favor reactive lymph nodes. Trachea, esophagus and thyroid glands are unremarkable. Lungs/Pleura: Consolidations with increased pulmonary interstitium identified in bilateral upper lobes, bilateral lower lobes with relative sparing of the right middle lobe. 4 mm focal peripheral nodule in the right middle lobe unchanged. Small right pleural effusion. Upper Abdomen: No acute abnormality. 10.2 cm simple cyst of left kidney is in size, incompletely included. Musculoskeletal: Degenerative joint changes of the spine are noted.  IMPRESSION: 1. Consolidations with increased pulmonary interstitium identified in bilateral upper lobes, bilateral lower lobes with relative sparing of the right middle lobe. Findings are consistent with multifocal pneumonia. 2. Small right pleural effusion. 3. Mediastinal and hilar lymphadenopathy, favor reactive lymph nodes. 4. 10.2 cm simple cyst of left kidney, incompletely included. Electronically Signed   By:  Abelardo Diesel M.D.   On: 05/10/2022 14:54   DG Chest Portable 1 View  Result Date: 05/10/2022 CLINICAL DATA:  Shortness of breath, cough EXAM: PORTABLE CHEST 1 VIEW COMPARISON:  Previous studies including chest radiograph done on 01/21/2022 and CT chest done on 04/16/2022 FINDINGS: Transverse diameter of heart is increased. Central pulmonary vessels are prominent. Increased interstitial and alveolar markings are seen in right upper lung fields and both lower lung fields. There is elevation of minor fissure on the right side suggesting decreased volume in right upper lobe. There is no significant pleural effusion. There is no pneumothorax. IMPRESSION: Cardiomegaly. Infiltrates are seen in right upper lobe and both lower lung fields suggesting multifocal pneumonia. Part of this finding may suggest underlying scarring or pulmonary edema. Electronically Signed   By: Elmer Picker M.D.   On: 05/10/2022 13:09   CT Chest Wo Contrast  Result Date: 04/23/2022 CLINICAL DATA:  Recurrent pneumonia EXAM: CT CHEST WITHOUT CONTRAST TECHNIQUE: Multidetector CT imaging of the chest was performed following the standard protocol without IV contrast. RADIATION DOSE REDUCTION: This exam was performed according to the departmental dose-optimization program which includes automated exposure control, adjustment of the mA and/or kV according to patient size and/or use of iterative reconstruction technique. COMPARISON:  Multiple priors, most recent lung cancer screening CT dated January 18th 2023 FINDINGS: Cardiovascular: Mild cardiomegaly. No pericardial effusion. Normal caliber thoracic aorta with mild atherosclerotic disease. Mediastinum/Nodes: Esophagus thyroid are unremarkable. No pathologically enlarged lymph nodes seen in the chest. Lungs/Pleura: Central airways are patent. Mild centrilobular emphysema. Clustered peribronchovascular cysts of the left upper lobe is similar to prior exams and may be due to cystic  bronchiectasis. Interval decreased bilateral patchy upper lung predominant ground-glass opacities. No new opacities, no pleural effusion or pneumothorax. Nodular linear opacity of the left lower lobe measuring 12 x 6 mm on series 4, image 99 is unchanged when compared with priors and likely due to scarring or atelectasis. Peripheral opacity of the right lower lobe located on series 4, image 64 is unchanged compared with most recent prior and likely due to scarring additional smaller bilateral solid pulmonary nodules are stable. Reference solid nodule of the left lower lobe measuring 4 mm on series 4, image 119. Upper Abdomen: No acute abnormality. Musculoskeletal: No chest wall mass or suspicious bone lesions identified. IMPRESSION: 1. Interval decreased bilateral patchy upper lung predominant ground-glass opacities. When compared with multiple prior exams, patient has a history of recurrent lung opacities of varying morphology, findings may be due to repeat infection or inflammatory process such as eosinophilic pneumonia or vasculitis. 2. Bilateral solid pulmonary nodules are stable. Recommend attention on annual lung cancer screening CT. 3. Aortic Atherosclerosis (ICD10-I70.0) and Emphysema (ICD10-J43.9). Electronically Signed   By: Yetta Glassman M.D.   On: 04/23/2022 10:37     Assessment & Plan:   No problem-specific Assessment & Plan notes found for this encounter.     Martyn Ehrich, NP 05/16/2022

## 2022-05-16 NOTE — Patient Instructions (Addendum)
Recommendations: Use flutter valve three times a day  Continue mucinex '600mg'$  twice a day until follow-up  Continue Advair 518mg- one puffs morning and evening Continue Singulair '10mg'$  at bedtime  Use Albuterol 1-2 puffs every 4-6 hours for breakthrough   Orders: Flutter valve  CXR today   Follow-up: 3-4 weeks with Beth NP    Food Choices for Gastroesophageal Reflux Disease, Adult When you have gastroesophageal reflux disease (GERD), the foods you eat and your eating habits are very important. Choosing the right foods can help ease your discomfort. Think about working with a food expert (dietitian) to help you make good choices. What are tips for following this plan? Reading food labels Look for foods that are low in saturated fat. Foods that may help with your symptoms include: Foods that have less than 5% of daily value (DV) of fat. Foods that have 0 grams of trans fat. Cooking Do not fry your food. Cook your food by baking, steaming, grilling, or broiling. These are all methods that do not need a lot of fat for cooking. To add flavor, try to use herbs that are low in spice and acidity. Meal planning  Choose healthy foods that are low in fat, such as: Fruits and vegetables. Whole grains. Low-fat dairy products. Lean meats, fish, and poultry. Eat small meals often instead of eating 3 large meals each day. Eat your meals slowly in a place where you are relaxed. Avoid bending over or lying down until 2-3 hours after eating. Limit high-fat foods such as fatty meats or fried foods. Limit your intake of fatty foods, such as oils, butter, and shortening. Avoid the following as told by your doctor: Foods that cause symptoms. These may be different for different people. Keep a food diary to keep track of foods that cause symptoms. Alcohol. Drinking a lot of liquid with meals. Eating meals during the 2-3 hours before bed. Lifestyle Stay at a healthy weight. Ask your doctor what  weight is healthy for you. If you need to lose weight, work with your doctor to do so safely. Exercise for at least 30 minutes on 5 or more days each week, or as told by your doctor. Wear loose-fitting clothes. Do not smoke or use any products that contain nicotine or tobacco. If you need help quitting, ask your doctor. Sleep with the head of your bed higher than your feet. Use a wedge under the mattress or blocks under the bed frame to raise the head of the bed. Chew sugar-free gum after meals. What foods should eat?  Eat a healthy, well-balanced diet of fruits, vegetables, whole grains, low-fat dairy products, lean meats, fish, and poultry. Each person is different. Foods that may cause symptoms in one person may not cause any symptoms in another person. Work with your doctor to find foods that are safe for you. The items listed above may not be a complete list of what you can eat and drink. Contact a food expert for more options. What foods should I avoid? Limiting some of these foods may help in managing the symptoms of GERD. Everyone is different. Talk with a food expert or your doctor to help you find the exact foods to avoid, if any. Fruits Any fruits prepared with added fat. Any fruits that cause symptoms. For some people, this may include citrus fruits, such as oranges, grapefruit, pineapple, and lemons. Vegetables Deep-fried vegetables. [place]. Any vegetables prepared with added fat. Any vegetables that cause symptoms. For some people, this  may include tomatoes and tomato products, chili peppers, onions and garlic, and horseradish. Grains Pastries or quick breads with added fat. Meats and other proteins High-fat meats, such as fatty beef or pork, hot dogs, ribs, ham, sausage, salami, and bacon. Fried meat or protein, including fried fish and fried chicken. Nuts and nut butters, in large amounts. Dairy Whole milk and chocolate milk. Sour cream. Cream. Ice cream. Cream cheese.  Milkshakes. Fats and oils Butter. Margarine. Shortening. Ghee. Beverages Coffee and tea, with or without caffeine. Carbonated beverages. Sodas. Energy drinks. Fruit juice made with acidic fruits, such as orange or grapefruit. Tomato juice. Alcoholic drinks. Sweets and desserts Chocolate and cocoa. Donuts. Seasonings and condiments Pepper. Peppermint and spearmint. Added salt. Any condiments, herbs, or seasonings that cause symptoms. For some people, this may include curry, hot sauce, or vinegar-based salad dressings. The items listed above may not be a complete list of what you should not eat and drink. Contact a food expert for more options. Questions to ask your doctor Diet and lifestyle changes are often the first steps that are taken to manage symptoms of GERD. If diet and lifestyle changes do not help, talk with your doctor about taking medicines. Where to find more information International [employer] for Gastrointestinal Disorders: aboutgerd.org Summary When you have GERD, food and lifestyle choices are very important in easing your symptoms. Eat small meals often instead of 3 large meals a day. Eat your meals slowly and in a place where you are relaxed. Avoid bending over or lying down until 2-3 hours after eating. Limit high-fat foods such as fatty meats or fried foods. This information is not intended to replace advice given to you by your health care provider. Make sure you discuss any questions you have with your health care provider. Document Revised: 12/05/2019 Document Reviewed: 12/05/2019 Elsevier Patient Education  [place].   Aspiration Precautions, Adult Aspiration is when a person breathes in (inhales) a liquid or other material, and it goes into the lungs. Adults who have conditions that affect their brain or spinal cord or who have trouble swallowing, a decreased gag reflex, or trouble moving around (mobility) are at risk for this condition. Things that can be  inhaled into the lungs include: Food. Any type of liquid. This includes drinks and saliva. Stomach contents. This includes vomit and stomach acid. This condition can cause an infection in the lungs (pneumonia). Certain steps can be taken to reduce the risk of aspiration. What are the signs of aspiration? Signs may include: Coughing. A person may: Cough after they swallow food or liquids. Cough up mucus (sputum) that is yellow, tan, or green. It may also smell bad or have pieces of food in it. Cough when lying down or have to sit up quickly after lying down. Have a hoarse, barky cough. Trouble breathing. This may include: Breathing very quickly or very slowly. Loud breathing. High-pitched whistling sounds during breathing, most often when breathing out (wheezing). Trouble eating. This may involve: Clearing the throat often while eating. Drooling while eating. Having a feeling of fullness in the throat or like something is stuck in the throat. Having a runny nose and watery eyes while eating. Speaking problems. This may include a hoarse voice or inability to speak. Choking often while eating. Changes in skin color. The skin may look red or blue. Pain in the chest or back during or right after eating. What problems can aspiration cause? This condition can cause problems such as: Losing weight because  the person is not absorbing the nutrients they need. Loss of enjoyment and the social benefits of eating. Choking. Lung irritation. This can happen if someone aspirates acidic food or drinks. Pneumonia. Lung abscess. This is a collection of infected liquid (pus) in the lungs. In serious cases, death can occur. What can I do to prevent aspiration? Caring for someone who has a feeding tube If you are caring for someone who has a feeding tube and cannot eat or drink safely by mouth: Keep the person in an upright position as much as possible. Do not lay the person flat if they get feedings  around the clock (continuous feedings). If you need to lay the person flat for any reason, turn the feeding pump off. Check for left over liquid (residuals) in the stomach as told by the health care provider. Ask the health care provider what residual amount is too high. Caring for someone who can eat and drink safely by mouth If you are caring for someone who can eat and drink safely by mouth: Have the person sit in an upright position when they eat or drink. This can be done by: Having the person sit up in a chair. Positioning the person in bed so they are upright. This can be done if sitting in a chair is not possible. Remind the person to eat slowly and chew well. Make sure the person is awake and alert while eating. Never put food or liquids in the mouth of a person who is not fully alert. Do not distract the person, especially if they have problems with thinking or memory (cognitive problems). Allow foods to cool. Hot foods may be harder to swallow. Provide small meals often rather than three large meals. This may help the person to not feel tired when eating. After the person is done eating: Check their mouth well for leftover food. Keep them sitting upright for 30-45 minutes. Do not serve food or drink within 2 hours of bedtime. General instructions To prevent aspiration in someone who can eat and drink safely by mouth: Feed small bites of food. Do not force-feed. If the person is on a diet because of problems with swallowing (dysphagia diet), follow the food and drink consistency that the health care provider recommends. You may be told to thicken a liquid using a thickening product. Use as little water as possible when brushing the person's teeth or cleaning their mouth. Provide oral care before and after meals. Use adaptive devices as told by the health care provider. These may include cut-out cups or other utensils. Crush pills and put them in soft food, such as pudding or ice  cream. Some pills should not be crushed. Check with the health care provider before crushing any medicine. If the person is choking on food or an object, do abdominal thrusts.  Contact a health care provider if: The person has a feeding tube, and the feeding tube residual amount is too high. The person tries to avoid food or water. They may refuse to eat, drink, or be fed, or they may eat less than normal. The person may have aspirated food or liquid. The person shows warning signs of aspiration. These include choking or coughing when they eat or drink. The person has symptoms of pneumonia. These may include: Coughing a lot or coughing up mucus with a bad smell or blood in it. A long-lasting (chronic) cough. Shortness of breath or wheezing. Chest or back pain. Sweating, fever, or chills. Get help right  away if: The person has trouble breathing or starts to breathe fast. The person is breathing very slowly or stops breathing. The person cannot stop choking. The person turns blue, faints, or seems confused. These symptoms may be an emergency. Get help right away. Call 911. Do not wait to see if the symptoms will go away. This information is not intended to replace advice given to you by your health care provider. Make sure you discuss any questions you have with your health care provider. Document Revised: 11/06/2021 Document Reviewed: 11/06/2021 Elsevier Patient Education  [place].

## 2022-05-19 NOTE — Progress Notes (Signed)
Please let patient know chest x-ray showed improvement in multifocal pneumonia, holding off on additional antibiotics at this time.  We will need to repeat chest x-ray in another 4 weeks to ensure resolution

## 2022-05-19 NOTE — Assessment & Plan Note (Signed)
New; Still requiring O2 Continue supplemental oxygen 2L to maintain O2 >88-90% Check walk test at follow-up to see if we can discontinue oxygen

## 2022-05-19 NOTE — Assessment & Plan Note (Addendum)
Pulmonary function testing in November showed mild obstructive lung disease  Continue Advair 577mg- one puff morning and evening Continue Singulair '10mg'$  at bedtime  Use Albuterol 1-2 puffs every 4-6 hours for breakthrough

## 2022-05-19 NOTE — Assessment & Plan Note (Addendum)
Admitted 4 days in December 2023 for acute respiratory failure secondary to multifocal pneumonia and COPD exacerbation. Clincally doing better. Completed Cefadroxil '500mg'$  BID x 3 days outpatient. PNA felt to be related to GERD/aspiration. Cont protonix twice daily.   Recommendations: Use flutter valve three times a day  Continue mucinex '600mg'$  twice a day until follow-up   Orders: Flutter valve  CXR today   Follow-up: 3-4 weeks with Eustaquio Maize NP

## 2022-05-21 ENCOUNTER — Other Ambulatory Visit: Payer: Self-pay | Admitting: *Deleted

## 2022-05-21 DIAGNOSIS — J189 Pneumonia, unspecified organism: Secondary | ICD-10-CM

## 2022-05-26 ENCOUNTER — Ambulatory Visit: Payer: PPO | Admitting: Internal Medicine

## 2022-05-27 ENCOUNTER — Encounter: Payer: Self-pay | Admitting: Internal Medicine

## 2022-05-27 ENCOUNTER — Ambulatory Visit: Payer: PPO | Attending: Internal Medicine | Admitting: Internal Medicine

## 2022-05-27 ENCOUNTER — Ambulatory Visit: Payer: PPO

## 2022-05-27 ENCOUNTER — Ambulatory Visit: Payer: PPO | Attending: Internal Medicine

## 2022-05-27 VITALS — BP 112/60 | HR 60 | Ht 66.0 in | Wt 197.0 lb

## 2022-05-27 DIAGNOSIS — I480 Paroxysmal atrial fibrillation: Secondary | ICD-10-CM | POA: Diagnosis not present

## 2022-05-27 DIAGNOSIS — Z79899 Other long term (current) drug therapy: Secondary | ICD-10-CM

## 2022-05-27 DIAGNOSIS — I5040 Unspecified combined systolic (congestive) and diastolic (congestive) heart failure: Secondary | ICD-10-CM

## 2022-05-27 NOTE — Progress Notes (Signed)
Cardiology Office Note   Date:  05/27/2022   ID:  Taylor [name], DOB 1950/12/01, MRN [id number]  PCP:  [place], Julie M, NP  Cardiologist:   Dorris Carnes, MD   F/U of PAF    History of Present Illness: Taylor [name] is a 71 y.o. female with a history of OSA, COPD, DM hypothyroidism, GERD and PA Patient had afib initially in 2009, around time of URI.  Had intermitt after. Placed on flecanide 100 bid   In 2021 she had recurrent palpitations / afib and was seen by Elliot Cousin.  She underwent ablation of afib in early 2021    After this her flecanide was stopped   With no recurrence of afib, Xarelto was also disconfinued   I saw the pt in clinic at the end of November   She was doing OK at the time   Since seen she was hosptialized  with resp failure   Treated for multifocal pneumonia     On admission she was in afib with RVR   She converted on own and HR rmained congtorlled  The pt says her breathing is improved   She denies palpitations   She denies CP      Current Meds  Medication Sig   acetaminophen (TYLENOL) 500 MG tablet Take 1,000 mg by mouth every 6 (six) hours as needed for moderate pain or headache.    albuterol (PROVENTIL HFA;VENTOLIN HFA) 108 (90 BASE) MCG/ACT inhaler Inhale 1 puff into the lungs every 6 (six) hours as needed for wheezing or shortness of breath.   albuterol (PROVENTIL) (2.5 MG/3ML) 0.083% nebulizer solution Take 3 mLs (2.5 mg total) by nebulization every 4 (four) hours as needed for wheezing or shortness of breath.   Ascorbic Acid (VITAMIN C) 1000 MG tablet Take 1,000 mg by mouth daily.   buPROPion (WELLBUTRIN XL) 150 MG 24 hr tablet Take 150 mg by mouth 2 (two) times daily.   cetirizine (ZYRTEC) 10 MG tablet Take 10 mg by mouth daily.    Cinnamon 500 MG TABS Take 1,000 mg by mouth daily.   ESTRADIOL TD Apply 1 mL topically daily. Estradiol 7.'5mg'$ /gm cream ( apply to thighs)   ezetimibe (ZETIA) 10 MG tablet Take 1 tablet (10 mg total) by mouth daily.    FLUoxetine (PROZAC) 20 MG tablet Take 40 mg by mouth daily.   Fluticasone-Salmeterol (ADVAIR) 500-50 MCG/DOSE AEPB Inhale 1 puff into the lungs in the morning and at bedtime.    furosemide (LASIX) 40 MG tablet Take 40 mg by mouth daily.   Guaifenesin 1200 MG TB12 Take 1,200 mg by mouth daily.   hydrOXYzine (ATARAX/VISTARIL) 50 MG tablet Take 50 mg by mouth daily as needed for itching.    ketoconazole (NIZORAL) 2 % cream Apply 1 application topically daily.    metoprolol tartrate (LOPRESSOR) 25 MG tablet Take 1 tablet (25 mg total) by mouth 2 (two) times daily.   montelukast (SINGULAIR) 10 MG tablet Take 10 mg by mouth at bedtime.   OIL OF OREGANO PO Take 1-2 capsules by mouth daily as needed (cold symptoms).    Omega-3 Fatty Acids (FISH OIL) 1000 MG CAPS Take 2,000 mg by mouth daily.   OVER THE COUNTER MEDICATION Take 2 tablets by mouth daily at 6 (six) AM. SACROMYCES bouliardi (probiotic)   Oxycodone HCl 10 MG TABS Take 10 mg by mouth 5 (five) times daily as needed (pain).   pantoprazole (PROTONIX) 40 MG tablet Take 1 tablet (40 mg total)  by mouth 2 (two) times daily.   potassium chloride (KLOR-CON M10) 10 MEQ tablet Take 1 tablet (10 mEq total) by mouth daily.   pregabalin (LYRICA) 100 MG capsule Take 100 mg by mouth 3 (three) times daily.   progesterone (PROMETRIUM) 100 MG capsule Take 300 mg by mouth every evening.    pseudoephedrine (SUDAFED) 120 MG 12 hr tablet Take 120 mg by mouth daily.   TESTOSTERONE TD Place 1-2 application onto the skin See admin instructions. Testosterone 2% cream. Apply 1 click's worth of testosterone cream on Sun, Tues, Thurs, and Sat. Apply 2 click's worth on Mon, Wed, and Fri   thyroid (ARMOUR) 60 MG tablet Take 30-120 mg by mouth See admin instructions. Take 120 mg in the morning and 30 mg in the afternoon   tiZANidine (ZANAFLEX) 4 MG tablet Take 2-4 mg by mouth 4 (four) times daily as needed for muscle spasms. 2 mg 3 times daily - 4 mg at bedtime   TURMERIC PO  Take 1,440 mg by mouth every evening.   Vitamin D-Vitamin K (VITAMIN K2-VITAMIN D3 PO) Take 1 capsule by mouth every evening.     Allergies:   Avelox [moxifloxacin hcl in nacl], Linezolid, Venlafaxine, Rosuvastatin, Cephalexin, Clindamycin, Nitrofurantoin, Other, Penicillins, and Statins   Past Medical History:  Diagnosis Date   Anxiety    Arthritis    "hands, neck, back" (07/22/2013)   Atrial fibrillation ([place])    CHEST DISCOMFORT    Chronic back pain    "started in lower back; getting to be all over my back" (07/22/2013)   Chronic bronchitis ([place])    "I've had it 2 years in a row; do have some trouble breathing at times; use an inhaler prn; not asthma" (07/22/2013)   Chronic neck pain    COPD (chronic obstructive pulmonary disease) ([facility])    DDD (degenerative disc disease), cervical    DDD (degenerative disc disease), lumbar    Depression    Dysrhythmia    GERD (gastroesophageal reflux disease)    Hepatitis 1971   "don't know if it was A or B" (07/22/2013)   HYPERLIPIDEMIA-MIXED 2005   "went away after I started taking fish oil" (07/22/2013)   Hypothyroidism    Palpitations    Pneumonia 2013   Type II diabetes mellitus ([place])    "diet controlled" (07/22/2013)    Past Surgical History:  Procedure Laterality Date   ANTERIOR CERVICAL DECOMP/DISCECTOMY FUSION  ?2003   "w/plating" (07/22/2013)   ATRIAL FIBRILLATION ABLATION N/A 09/21/2019   Procedure: ATRIAL FIBRILLATION ABLATION;  Surgeon: Constance Haw, MD;  Location: [facility];  Service: Cardiovascular;  Laterality: N/A;   BUNIONECTOMY Left 2013   DILATION AND CURETTAGE OF UTERUS  1976   PLANTAR FASCIA SURGERY Right 2011     Social History:  The patient  reports that she quit smoking about 14 years ago. Her smoking use included cigarettes. She has a 40.00 pack-year smoking history. She has never used smokeless tobacco. She reports that she does not currently use alcohol. She reports that she does not use drugs.    Family History:  The patient's family history includes Diabetes in her maternal grandfather; Heart attack in her paternal grandfather; Heart disease in an other family member.    ROS:  Please see the history of present illness. All other systems are reviewed and  Negative to the above problem except as noted.    PHYSICAL EXAM: VS:  BP 112/60 (BP Location: Left Arm, Patient Position: Sitting,  Cuff Size: Normal)   Pulse 60   Ht '5\' 6"'$  (1.676 m)   Wt 197 lb (89.4 kg)   SpO2 98%   BMI 31.80 kg/m   Gen  Obese 71 yo in no acute distress  HEENT: normal  Neck: JVP normal , No carotid bruit Cardiac: RRR  No murmurs  No LE edema  Respiratory:  clear to auscultation biilaterally   GI: soft, nontender, nondistended, + BS  No hepatomegaly  MS: no deformity Moving all extremities   Skin: warm and dry  Rash around mouth  SOme erythema at arms   Neuro:  Grossly intact  Psych: euthymic mood, full affect   EKG:  EKG isnot ordered today  Lipid Panel    Component Value Date/Time   CHOL 192 05/07/2022 1217   TRIG 75 05/07/2022 1217   HDL 59 05/07/2022 1217   CHOLHDL 3.3 05/07/2022 1217   CHOLHDL 4.2 CALC 11/17/2007 0844   VLDL 18 11/17/2007 0844   LDLCALC 119 (H) 05/07/2022 1217      Wt Readings from Last 3 Encounters:  05/27/22 197 lb (89.4 kg)  05/16/22 191 lb 12.8 oz (87 kg)  05/10/22 201 lb (91.2 kg)      ASSESSMENT AND PLAN:  1  PAF Pt is s/p ablation  She is off of antiarrhythmics and antioagualation She was jrecently admitted for pneumonia  In afib on admit    Converted REmains in SR    I would set up for a 14 day montior to look for occult afib       2  HFpEF   PT with normal LVEF   Gr II disatolic dysfunciton     Rapid afib and pnemonia are enough to explain recent exacerbation. Will check BMET and BNP   Will give samples of Jardiance and follow     3  PAD  Pt with mild plaquing on CT   Control risk factors   3  HL      LDL in Nov 119   FOllow for now     4   HTN   BP is a controlled     Current medicines are reviewed at length with the patient today.  The patient does not have concerns regarding medicines.  Signed, Dorris Carnes, MD  05/27/2022 3:28 PM    [facility] [address], [place], [place]  [address] Phone: [phone]; Fax: [phone]

## 2022-05-27 NOTE — Patient Instructions (Signed)
Medication Instructions:   *If you need a refill on your cardiac medications before your next appointment, please call your pharmacy*   Lab Work: BMET AND PRO BNP TODAY  If you have labs (blood work) drawn today and your tests are completely normal, you will receive your results only by: [place] (if you have MyChart) OR A paper copy in the mail If you have any lab test that is abnormal or we need to change your treatment, we will call you to review the results.   Testing/Procedures: [place]- Long Term Monitor Instructions  Your physician has requested you wear a ZIO patch monitor for 14 days.  This is a single patch monitor. [employer] supplies one patch monitor per enrollment. Additional stickers are not available. Please do not apply patch if you will be having a Nuclear Stress Test,  Echocardiogram, Cardiac CT, MRI, or Chest Xray during the period you would be wearing the  monitor. The patch cannot be worn during these tests. You cannot remove and re-apply the  ZIO XT patch monitor.  Your ZIO patch monitor will be mailed 3 day USPS to your address on file. It may take 3-5 days  to receive your monitor after you have been enrolled.  Once you have received your monitor, please review the enclosed instructions. Your monitor  has already been registered assigning a specific monitor serial # to you.  Billing and Patient Assistance Program Information  We have supplied [employer] with any of your insurance information on file for billing purposes. [employer] offers a sliding scale Patient Assistance Program for patients that do not have  insurance, or whose insurance does not completely cover the cost of the ZIO monitor.  You must apply for the Patient Assistance Program to qualify for this discounted rate.  To apply, please call [employer] at [phone], select option 4, select option 2, ask to apply for  Patient Assistance Program. Theodore Demark will ask your household income, and how many  people  are in your household. They will quote your out-of-pocket cost based on that information.  [employer] will also be able to set up a 41-month interest-free payment plan if needed.  Applying the monitor   Shave hair from upper left chest.  Hold abrader disc by orange tab. Rub abrader in 40 strokes over the upper left chest as  indicated in your monitor instructions.  Clean area with 4 enclosed alcohol pads. Let dry.  Apply patch as indicated in monitor instructions. Patch will be placed under collarbone on left  side of chest with arrow pointing upward.  Rub patch adhesive wings for 2 minutes. Remove white label marked "1". Remove the white  label marked "2". Rub patch adhesive wings for 2 additional minutes.  While looking in a mirror, press and release button in center of patch. A small green light will  flash 3-4 times. This will be your only indicator that the monitor has been turned on.  Do not shower for the first 24 hours. You may shower after the first 24 hours.  Press the button if you feel a symptom. You will hear a small click. Record Date, Time and  Symptom in the Patient Logbook.  When you are ready to remove the patch, follow instructions on the last 2 pages of Patient  Logbook. Stick patch monitor onto the last page of Patient Logbook.  Place Patient Logbook in the blue and white box. Use locking tab on box and tape box closed  securely. The blue  and white box has prepaid postage on it. Please place it in the mailbox as  soon as possible. Your physician should have your test results approximately 7 days after the  monitor has been mailed back to [facility].  Call [place] at [phone] if you have questions regarding  your ZIO XT patch monitor. Call them immediately if you see an orange light blinking on your  monitor.  If your monitor falls off in less than 4 days, contact our Monitor department at [phone].  If your monitor becomes  loose or falls off after 4 days call [employer] at [phone] for  suggestions on securing your monitor    Follow-Up: At [facility], you and your health needs are our priority.  As part of our continuing mission to provide you with exceptional heart care, we have created designated Provider Care Teams.  These Care Teams include your primary Cardiologist (physician) and Advanced Practice Providers (APPs -  Physician Assistants and Nurse Practitioners) who all work together to provide you with the care you need, when you need it.  We recommend signing up for the patient portal called "MyChart".  Sign up information is provided on this After Visit Summary.  MyChart is used to connect with patients for Virtual Visits (Telemedicine).  Patients are able to view lab/test results, encounter notes, upcoming appointments, etc.  Non-urgent messages can be sent to your provider as well.   To learn more about what you can do with MyChart, go to [email or web address].    Your next appointment:   3 month(s)  The format for your next appointment:   In Person  Provider:   Dorris Carnes, MD     Other Instructions   Important Information About [employer]

## 2022-05-27 NOTE — Progress Notes (Unsigned)
Enrolled patient for a 14 Day Zio XT monitor to be mailed to patients home 

## 2022-05-28 ENCOUNTER — Encounter: Payer: Self-pay | Admitting: Internal Medicine

## 2022-05-28 ENCOUNTER — Encounter: Payer: Self-pay | Admitting: Emergency Medicine

## 2022-05-28 LAB — BASIC METABOLIC PANEL
BUN/Creatinine Ratio: 21 (ref 12–28)
BUN: 21 mg/dL (ref 8–27)
CO2: 27 mmol/L (ref 20–29)
Calcium: 9.2 mg/dL (ref 8.7–10.3)
Chloride: 101 mmol/L (ref 96–106)
Creatinine, Ser: 0.99 mg/dL (ref 0.57–1.00)
Glucose: 81 mg/dL (ref 70–99)
Potassium: 4.7 mmol/L (ref 3.5–5.2)
Sodium: 141 mmol/L (ref 134–144)
eGFR: 62 mL/min/{1.73_m2} (ref >59)

## 2022-05-28 LAB — PRO B NATRIURETIC PEPTIDE: NT-Pro BNP: 364 pg/mL — ABNORMAL HIGH (ref 0–301)

## 2022-05-28 NOTE — Telephone Encounter (Signed)
Thank you - can we scan her notes into media section

## 2022-05-28 NOTE — Telephone Encounter (Signed)
Mel Almond,  Do you know if we can scan notes from media section of mychart message into media section of pt's chart?

## 2022-05-28 NOTE — Telephone Encounter (Signed)
Dr. Lamonte Sakai, please see mychart message and also media attachment from pt and advise.

## 2022-05-29 NOTE — Telephone Encounter (Signed)
Looks like the attachment from the [place] message automatically uploaded to patient's media in chart. Let me know if you cannot see it.

## 2022-05-29 NOTE — Telephone Encounter (Signed)
Looked at [employer] section and do see pt's media in chart. Nothing further needed.

## 2022-05-31 ENCOUNTER — Telehealth: Payer: Self-pay

## 2022-05-31 ENCOUNTER — Encounter: Payer: Self-pay | Admitting: Internal Medicine

## 2022-05-31 DIAGNOSIS — I10 Essential (primary) hypertension: Secondary | ICD-10-CM

## 2022-05-31 DIAGNOSIS — I480 Paroxysmal atrial fibrillation: Secondary | ICD-10-CM

## 2022-05-31 DIAGNOSIS — I5040 Unspecified combined systolic (congestive) and diastolic (congestive) heart failure: Secondary | ICD-10-CM

## 2022-05-31 DIAGNOSIS — Z79899 Other long term (current) drug therapy: Secondary | ICD-10-CM

## 2022-05-31 MED ORDER — FUROSEMIDE 40 MG PO TABS: 60.0000 mg | ORAL_TABLET | Freq: Every day | ORAL | 3 refills | Status: DC

## 2022-05-31 NOTE — Telephone Encounter (Signed)
-----   Message from Dorris Carnes V, MD sent at 05/29/2022  2:24 PM EST ----- Electrolytes and kidney function are OK FLuid does appear to be up a little I would recmm increasing lasix to 60 mg daily FOllow BMET and BNP in 2 wks

## 2022-05-31 NOTE — Telephone Encounter (Signed)
My Chart sent to the pt... need lab date.

## 2022-06-01 ENCOUNTER — Encounter: Payer: Self-pay | Admitting: Internal Medicine

## 2022-06-05 DIAGNOSIS — I480 Paroxysmal atrial fibrillation: Secondary | ICD-10-CM | POA: Diagnosis not present

## 2022-06-05 DIAGNOSIS — I5040 Unspecified combined systolic (congestive) and diastolic (congestive) heart failure: Secondary | ICD-10-CM | POA: Diagnosis not present

## 2022-06-05 DIAGNOSIS — Z79899 Other long term (current) drug therapy: Secondary | ICD-10-CM

## 2022-06-06 ENCOUNTER — Ambulatory Visit (INDEPENDENT_AMBULATORY_CARE_PROVIDER_SITE_OTHER): Payer: PPO | Admitting: Nurse Practitioner

## 2022-06-06 ENCOUNTER — Encounter: Payer: Self-pay | Admitting: Nurse Practitioner

## 2022-06-06 ENCOUNTER — Ambulatory Visit (INDEPENDENT_AMBULATORY_CARE_PROVIDER_SITE_OTHER): Payer: PPO

## 2022-06-06 ENCOUNTER — Other Ambulatory Visit: Payer: Self-pay | Admitting: *Deleted

## 2022-06-06 VITALS — BP 110/68 | HR 66 | Temp 97.8°F | Ht 66.0 in | Wt 201.4 lb

## 2022-06-06 DIAGNOSIS — J189 Pneumonia, unspecified organism: Secondary | ICD-10-CM

## 2022-06-06 DIAGNOSIS — K219 Gastro-esophageal reflux disease without esophagitis: Secondary | ICD-10-CM

## 2022-06-06 DIAGNOSIS — J9601 Acute respiratory failure with hypoxia: Secondary | ICD-10-CM

## 2022-06-06 DIAGNOSIS — J449 Chronic obstructive pulmonary disease, unspecified: Secondary | ICD-10-CM | POA: Diagnosis not present

## 2022-06-06 NOTE — Assessment & Plan Note (Addendum)
Resolved. Walk without desaturations on room air. Ok to discontinue supplemental O2. Goal >88-90%.

## 2022-06-06 NOTE — Assessment & Plan Note (Signed)
Hospitalized 12/2-12/6 with multifocal pneumonia. Completed abx 12/9. Clinical and radiographic improvement. She does still have some mild airspace disease, appears to be consistent with resolving pneumonia. She did have lymphadenopathy on CT imaging from her hospital stay, likely reactive, but will need to be evaluated to ensure resolution. Recommend repeating in 4 weeks for 6-8 week follow up; orders placed today. We will also recheck CBC today to ensure leukocytosis has resolved.  Felt that her recurrent pneumonias are possibly related to GERD/aspiration. Referral to GI placed today for further evaluation as she is on optimal therapy with twice daily PPI and compliant with GERD reduction measures. 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 Recent peripheral eosinophils were nl. She has been vaccinated with pneumovax 13 and 23, most recently in 05/2021 - check IgG panel today. May also need to consider this could be related to ICS use?   Patient Instructions  Use flutter valve three times a day  Continue mucinex '600mg'$  twice a day until follow-up  Continue Advair 566mg- one puffs morning and evening Continue Singulair '10mg'$  at bedtime  Use Albuterol 1-2 puffs every 4-6 hours for breakthrough  Continue protonix 1 tab Twice daily for reflux  Sleep with head of bed elevated. Avoid eating 2-3 hours before bedtime. I have sent a referral to GI to evaluate for persistent reflux despite treatment.   Labs today   Follow up in 10-12 weeks with Dr. BLamonte Sakai If symptoms worsen, please contact office for sooner follow up or seek emergency care.

## 2022-06-06 NOTE — Assessment & Plan Note (Signed)
Appears compensated on current regimen today. Continue Advair, singulair and PRN SABA.

## 2022-06-06 NOTE — Patient Instructions (Addendum)
Use flutter valve three times a day  Continue mucinex '600mg'$  twice a day until follow-up  Continue Advair 559mg- one puffs morning and evening Continue Singulair '10mg'$  at bedtime  Use Albuterol 1-2 puffs every 4-6 hours for breakthrough  Continue protonix 1 tab Twice daily for reflux  Sleep with head of bed elevated. Avoid eating 2-3 hours before bedtime. I have sent a referral to GI to evaluate for persistent reflux despite treatment.   Labs today   Repeat CT chest in 4 weeks   Follow up in 10-12 weeks with Taylor [name] If symptoms worsen, please contact office for sooner follow up or seek emergency care.

## 2022-06-06 NOTE — Progress Notes (Signed)
$'@Patient'I$  ID: Taylor [name], female    DOB: 02/06/1951, 71 y.o.   MRN: [id number]  Chief Complaint  Patient presents with   Follow-up    Multifocal pneumonia  Feeling much better.Congestion in chest,Coughing,wheezing,Yellow mucus. Chest xray 06/06/2022    Referring provider: [place], Julie M, NP  HPI: 71 year old female, former smoker followed for COPD and recurrent pneumonia.  She is a patient Dr. Agustina Caroli and last seen in office by Volanda Napoleon NP on 05/16/2022.  Past medical history significant for hypertension, PAF, allergic rhinitis, GERD.  TEST/EVENTS:  04/23/2022 PFT: FVC 96, FEV1 92, ratio 71, TLC 94, DLCOcor 74. No BD. 05/10/2022 CT chest with contrast: Heart size is mildly enlarged.  Mediastinal and hilar lymphadenopathy favor reactive lymph nodes.  Consolidations with increased pulmonary interstitium identified in bilateral upper lobes, bilateral lower lobes with relative sparing of the right middle lobe.  Findings are consistent with multifocal pneumonia.  4 mm focal peripheral nodule in the right middle lobe unchanged.  Small right pleural effusion.  05/16/2022: [name] NP for hospital follow-up.  She was hospitalized 05/10/2022 through 05/14/2022 for multifocal pneumonia and acute respiratory failure.  She was treated with IV antibiotics and transition to cefadroxil to complete her course upon discharge.  Discharged on supplemental oxygen and steroid taper.  Suspected that GERD was playing a role in recurrent pneumonia/aspiration.  Change PPI to twice daily and reviewed GERD measures.  She was noted to have rapid A-fib on admission due to her acute illness, converted back to sinus rhythm.  Advised her to follow-up with cardiology for further monitoring.  During her follow-up visit, she reported that she was feeling better.  She has 1 day left of antibiotics.  Still feeling more fatigued than her baseline.  Also has some postnasal drip symptoms.  Cough feels like it is loosened up.   Taking Mucinex once a day.  Wearing oxygen 24/7.  She is encouraged to continue mucociliary clearance therapies.  Continued on Advair and Singulair.  Follow-up in 4 weeks with repeat chest x-ray.  06/06/2022: Today-follow-up Patient presents today for follow-up.  Since she was here last, she has completed all of her antibiotics.  She continues to feel like she is slowly improving.  Energy levels are starting to return although she still does not quite feel back to baseline.  Her cough is also improved.  Minimal with occasional sputum production.  Breathing feels like it is at her baseline.  She denies any increased chest congestion, wheezing, hemoptysis, fevers, chills.  Has not been wearing her oxygen.  Oxygen levels have been maintaining above 90%.  She is taking Protonix twice daily.  She is not sure if this is controlling her GERD symptoms.  Still occasionally has some breakthrough.  She has had swallow study in the past without any overt aspiration.  Has never had GI workup.  She is up-to-date on pneumonia vaccines.  She is currently wearing a heart monitor to ensure she does not have any recurrent episodes of A-fib.  Plans to follow-up with cardiology afterwards.  Allergies  Allergen Reactions   Avelox [Moxifloxacin Hcl In Nacl] Hives   Linezolid Rash   Venlafaxine Nausea Only and Rash   Rosuvastatin Other (See Comments)    myalgias   Cephalexin Itching and Rash   Clindamycin Diarrhea   Nitrofurantoin Itching and Rash   Other Rash    Brewer yeast and Bakers yeast causes cracks in mouth stomach bloating    Penicillins Itching and Rash  Has patient had a PCN reaction causing anaphylaxis, immediate rash, facial/tongue/throat swelling, SOB or lightheadedness with hypotension? no Has patient had a PCN reaction causing severe rash involving mucus membranes or skin necrosis?  no Has patient had a PCN reaction that required hospitilization?no  Has patient had a PCN reaction occurring within the  last 10 years?  no If all of the above answers are "no" then may proceed with cephalosporin use.   Statins Other (See Comments)    Muscle pain    Immunization History  Administered Date(s) Administered   Covid-19, Mrna,Vaccine(Spikevax)71yr and older 03/29/2022   DTP 06/26/2005   Fluad Quad(high Dose 65+) 04/09/2022   Hepatitis A 06/20/2014, 12/19/2014   Hepatitis A, Ped/Adol-2 Dose 06/20/2014, 12/19/2014   Influenza, High Dose Seasonal PF 02/21/2018   Influenza,inj,Quad PF,6+ Mos 01/22/2018   Influenza,inj,quad, With Preservative 01/08/2019, 04/01/2019   Influenza-Unspecified 03/05/2012, 03/09/2013, 04/18/2013, 06/07/2014, 07/05/2015, 03/21/2016, 01/22/2018, 01/08/2019, 04/01/2019   PFIZER Comirnaty(Gray Top)Covid-19 Tri-Sucrose Vaccine 07/18/2019, 08/12/2019, 09/28/2020, 03/02/2021   PFIZER(Purple Top)SARS-COV-2 Vaccination 07/18/2019, 08/12/2019   Pneumococcal Conjugate-13 05/11/2018, 05/14/2021   Pneumococcal Polysaccharide-23 06/20/2014   Tdap 01/19/2012   Unspecified SARS-COV-2 Vaccination 07/18/2019, 08/12/2019   Zoster Recombinat (Shingrix) 09/23/2017, 01/22/2018   Zoster, Unspecified 09/23/2017, 01/22/2018    Past Medical History:  Diagnosis Date   Anxiety    Arthritis    "hands, neck, back" (07/22/2013)   Atrial fibrillation ([facility]    CHEST DISCOMFORT    Chronic back pain    "started in lower back; getting to be all over my back" (07/22/2013)   Chronic bronchitis (HCC)    "I've had it 2 years in a row; do have some trouble breathing at times; use an inhaler prn; not asthma" (07/22/2013)   Chronic neck pain    COPD (chronic obstructive pulmonary disease) (HCC)    DDD (degenerative disc disease), cervical    DDD (degenerative disc disease), lumbar    Depression    Dysrhythmia    GERD (gastroesophageal reflux disease)    Hepatitis 1971   "don't know if it was A or B" (07/22/2013)   HYPERLIPIDEMIA-MIXED 2005   "went away after I started taking fish oil" (07/22/2013)    Hypothyroidism    Palpitations    Pneumonia 2013   [place] II diabetes mellitus [place]    "diet controlled" (07/22/2013)    Tobacco History: Social History   Tobacco Use  Smoking Status Former   Packs/day: 1.00   Years: 40.00   Total pack years: 40.00   Types: Cigarettes   Quit date: 12/22/2007   Years since quitting: 14.4  Smokeless Tobacco Never   Counseling given: Not Answered   Outpatient Medications Prior to Visit  Medication Sig Dispense Refill   acetaminophen (TYLENOL) 500 MG tablet Take 1,000 mg by mouth every 6 (six) hours as needed for moderate pain or headache.      albuterol (PROVENTIL HFA;VENTOLIN HFA) 108 (90 BASE) MCG/ACT inhaler Inhale 1 puff into the lungs every 6 (six) hours as needed for wheezing or shortness of breath. 1 Inhaler 1   albuterol (PROVENTIL) (2.5 MG/3ML) 0.083% nebulizer solution Take 3 mLs (2.5 mg total) by nebulization every 4 (four) hours as needed for wheezing or shortness of breath. 75 mL 2   Ascorbic Acid (VITAMIN C) 1000 MG tablet Take 1,000 mg by mouth daily.     buPROPion (WELLBUTRIN XL) 150 MG 24 hr tablet Take 150 mg by mouth 2 (two) times daily.     cetirizine (ZYRTEC) 10 MG tablet Take  10 mg by mouth daily.      Cinnamon 500 MG TABS Take 1,000 mg by mouth daily.     doxepin (SINEQUAN) 10 MG capsule Take 10 mg by mouth at bedtime.     ESTRADIOL TD Apply 1 mL topically daily. Estradiol 7.'5mg'$ /gm cream ( apply to thighs)     ezetimibe (ZETIA) 10 MG tablet Take 1 tablet (10 mg total) by mouth daily. 90 tablet 3   FLUoxetine (PROZAC) 20 MG tablet Take 40 mg by mouth daily.     Fluticasone-Salmeterol (ADVAIR) 500-50 MCG/DOSE AEPB Inhale 1 puff into the lungs in the morning and at bedtime.      furosemide (LASIX) 40 MG tablet Take 1.5 tablets (60 mg total) by mouth daily. 135 tablet 3   Guaifenesin 1200 MG TB12 Take 1,200 mg by mouth daily.     hydrOXYzine (ATARAX/VISTARIL) 50 MG tablet Take 50 mg by mouth daily as needed for itching.       ketoconazole (NIZORAL) 2 % cream Apply 1 application topically daily.      metoprolol tartrate (LOPRESSOR) 25 MG tablet Take 1 tablet (25 mg total) by mouth 2 (two) times daily. 180 tablet 2   montelukast (SINGULAIR) 10 MG tablet Take 10 mg by mouth at bedtime.     OIL OF OREGANO PO Take 1-2 capsules by mouth daily as needed (cold symptoms).      Omega-3 Fatty Acids (FISH OIL) 1000 MG CAPS Take 2,000 mg by mouth daily.     OVER THE COUNTER MEDICATION Take 2 tablets by mouth daily at 6 (six) AM. SACROMYCES bouliardi (probiotic)     Oxycodone HCl 10 MG TABS Take 10 mg by mouth 5 (five) times daily as needed (pain).     pantoprazole (PROTONIX) 40 MG tablet Take 1 tablet (40 mg total) by mouth 2 (two) times daily. 60 tablet 5   potassium chloride (KLOR-CON M10) 10 MEQ tablet Take 1 tablet (10 mEq total) by mouth daily. 90 tablet 3   pregabalin (LYRICA) 100 MG capsule Take 100 mg by mouth 3 (three) times daily.     progesterone (PROMETRIUM) 100 MG capsule Take 300 mg by mouth every evening.      pseudoephedrine (SUDAFED) 120 MG 12 hr tablet Take 120 mg by mouth daily.     TESTOSTERONE TD Place 1-2 application onto the skin See admin instructions. Testosterone 2% cream. Apply 1 click's worth of testosterone cream on Sun, Tues, Thurs, and Sat. Apply 2 click's worth on Mon, Wed, and Fri     thyroid (ARMOUR) 60 MG tablet Take 30-120 mg by mouth See admin instructions. Take 120 mg in the morning and 30 mg in the afternoon     tiZANidine (ZANAFLEX) 4 MG tablet Take 2-4 mg by mouth 4 (four) times daily as needed for muscle spasms. 2 mg 3 times daily - 4 mg at bedtime     TURMERIC PO Take 1,440 mg by mouth every evening.     Vitamin D-Vitamin K (VITAMIN K2-VITAMIN D3 PO) Take 1 capsule by mouth every evening.     No facility-administered medications prior to visit.     Review of Systems:   Constitutional: No weight loss or gain, night sweats, fevers, chills. +fatigue, lassitude (improved) HEENT: No  headaches, difficulty swallowing, tooth/dental problems, or sore throat. No sneezing, itching, ear ache, nasal congestion, or post nasal drip CV:  No chest pain, orthopnea, PND, swelling in lower extremities, anasarca, dizziness, palpitations, syncope Resp: +shortness of breath with exertion;  resolving cough. No hemoptysis. No wheezing.  No chest wall deformity GI:  No heartburn, indigestion, abdominal pain, nausea, vomiting, loss of appetite GU: No dysuria, change in color of urine, urgency or frequency.   Skin: No rash, lesions, ulcerations MSK:  No joint pain or swelling.   Neuro: No dizziness or lightheadedness.  Psych: No depression or anxiety. Mood stable.     Physical Exam:  BP 110/68 (BP Location: Right Arm, Patient Position: Sitting, Cuff Size: Normal)   Pulse 66   Temp 97.8 F (36.6 C) (Oral)   Ht '5\' 6"'$  (1.676 m)   Wt 201 lb 6.4 oz (91.4 kg)   SpO2 96%   BMI 32.51 kg/m   GEN: Pleasant, interactive, well-appearing; obese; in no acute distress HEENT:  Normocephalic and atraumatic. PERRLA. Sclera white. Nasal turbinates pink, moist and patent bilaterally. No rhinorrhea present. Oropharynx pink and moist, without exudate or edema. No lesions, ulcerations, or postnasal drip.  NECK:  Supple w/ fair ROM. No JVD present. Normal carotid impulses w/o bruits. Thyroid symmetrical with no goiter or nodules palpated. No lymphadenopathy.   CV: RRR, no m/r/g, no peripheral edema. Pulses intact, +2 bilaterally. No cyanosis, pallor or clubbing. PULMONARY:  Unlabored, regular breathing. Clear bilaterally A&P w/o wheezes/rales/rhonchi. No accessory muscle use.  GI: BS present and normoactive. Soft, non-tender to palpation. No organomegaly or masses detected.  MSK: No erythema, warmth or tenderness. Cap refil <2 sec all extrem. No deformities or joint swelling noted.  Neuro: A/Ox3. No focal deficits noted.   Skin: Warm, no lesions or rashe Psych: Normal affect and behavior. Judgement and  thought content appropriate.     Lab Results:  CBC    Component Value Date/Time   WBC 14.7 (H) 05/12/2022 0333   RBC 4.05 05/12/2022 0333   HGB 12.2 05/12/2022 0333   HGB 14.0 11/08/2018 1537   HCT 38.4 05/12/2022 0333   HCT 41.0 11/08/2018 1537   PLT 264 05/12/2022 0333   PLT 306 11/08/2018 1537   MCV 94.8 05/12/2022 0333   MCV 86 11/08/2018 1537   MCH 30.1 05/12/2022 0333   MCHC 31.8 05/12/2022 0333   RDW 13.6 05/12/2022 0333   RDW 13.1 11/08/2018 1537   LYMPHSABS 0.8 05/12/2022 0333   MONOABS 0.7 05/12/2022 0333   EOSABS 0.0 05/12/2022 0333   BASOSABS 0.0 05/12/2022 0333    BMET    Component Value Date/Time   NA 141 05/27/2022 1556   K 4.7 05/27/2022 1556   CL 101 05/27/2022 1556   CO2 27 05/27/2022 1556   GLUCOSE 81 05/27/2022 1556   GLUCOSE 106 (H) 05/14/2022 0403   BUN 21 05/27/2022 1556   CREATININE 0.99 05/27/2022 1556   CALCIUM 9.2 05/27/2022 1556   GFRNONAA >60 05/14/2022 0403   GFRAA 60 (L) 09/21/2019 0708    BNP    Component Value Date/Time   BNP 292.9 (H) 05/10/2022 1307     Imaging:  DG Chest 2 View  Result Date: 06/06/2022 CLINICAL DATA:  multifocal pneumonia EXAM: CHEST - 2 VIEW COMPARISON:  Chest radiograph 05/16/2022 FINDINGS: Cardiac device over LEFT chest wall. Stable cardiac silhouette. There is bilateral interstitial thickening andmild airspace disease which is improved slightly from comparison exam. No pleural fluid no pneumothorax. Anterior cervical fusion IMPRESSION: Improved bilateral interstitial thickening and airspace disease. Electronically Signed   By: Suzy Bouchard M.D.   On: 06/06/2022 15:24   DG Chest 2 View  Result Date: 05/16/2022 CLINICAL DATA:  Multifocal pneumonia. EXAM: CHEST - 2  VIEW COMPARISON:  May 10, 2022. FINDINGS: Stable cardiomediastinal silhouette. Bilateral lung opacities are noted which are slightly decreased compared to prior exam. Bony thorax is unremarkable. IMPRESSION: Slightly decreased  bilateral lung opacities are noted suggesting slightly improving multifocal pneumonia. Electronically Signed   By: Marijo Conception M.D.   On: 05/16/2022 16:24   CT Chest W Contrast  Result Date: 05/10/2022 CLINICAL DATA:  Respiratory illness. Cough and shortness of breath for 1 week. EXAM: CT CHEST WITH CONTRAST TECHNIQUE: Multidetector CT imaging of the chest was performed during intravenous contrast administration. RADIATION DOSE REDUCTION: This exam was performed according to the departmental dose-optimization program which includes automated exposure control, adjustment of the mA and/or kV according to patient size and/or use of iterative reconstruction technique. CONTRAST:  32m OMNIPAQUE IOHEXOL 300 MG/ML  SOLN COMPARISON:  April 16, 2022 FINDINGS: Cardiovascular: No significant vascular findings. Heart size is mildly enlarged. No pericardial effusion. Mediastinum/Nodes: Mediastinal and hilar lymphadenopathy, favor reactive lymph nodes. Trachea, esophagus and thyroid glands are unremarkable. Lungs/Pleura: Consolidations with increased pulmonary interstitium identified in bilateral upper lobes, bilateral lower lobes with relative sparing of the right middle lobe. 4 mm focal peripheral nodule in the right middle lobe unchanged. Small right pleural effusion. Upper Abdomen: No acute abnormality. 10.2 cm simple cyst of left kidney is in size, incompletely included. Musculoskeletal: Degenerative joint changes of the spine are noted. IMPRESSION: 1. Consolidations with increased pulmonary interstitium identified in bilateral upper lobes, bilateral lower lobes with relative sparing of the right middle lobe. Findings are consistent with multifocal pneumonia. 2. Small right pleural effusion. 3. Mediastinal and hilar lymphadenopathy, favor reactive lymph nodes. 4. 10.2 cm simple cyst of left kidney, incompletely included. Electronically Signed   By: WAbelardo DieselM.D.   On: 05/10/2022 14:54   DG Chest Portable 1  View  Result Date: 05/10/2022 CLINICAL DATA:  Shortness of breath, cough EXAM: PORTABLE CHEST 1 VIEW COMPARISON:  Previous studies including chest radiograph done on 01/21/2022 and CT chest done on 04/16/2022 FINDINGS: Transverse diameter of heart is increased. Central pulmonary vessels are prominent. Increased interstitial and alveolar markings are seen in right upper lung fields and both lower lung fields. There is elevation of minor fissure on the right side suggesting decreased volume in right upper lobe. There is no significant pleural effusion. There is no pneumothorax. IMPRESSION: Cardiomegaly. Infiltrates are seen in right upper lobe and both lower lung fields suggesting multifocal pneumonia. Part of this finding may suggest underlying scarring or pulmonary edema. Electronically Signed   By: PElmer PickerM.D.   On: 05/10/2022 13:09         Latest Ref Rng & Units 04/23/2022    2:58 PM  PFT Results  FVC-Pre L 3.19   FVC-Predicted Pre % 96   FVC-Post L 3.27   FVC-Predicted Post % 98   Pre FEV1/FVC % % 73   Post FEV1/FCV % % 71   FEV1-Pre L 2.32   FEV1-Predicted Pre % 92   FEV1-Post L 2.31   DLCO uncorrected ml/min/mmHg 15.61   DLCO UNC% % 74   DLCO corrected ml/min/mmHg 15.61   DLCO COR %Predicted % 74   DLVA Predicted % 85   TLC L 5.05   TLC % Predicted % 94   RV % Predicted % 77     No results found for: "NITRICOXIDE"      Assessment & Plan:   Recurrent pneumonia Hospitalized 12/2-12/6 with multifocal pneumonia. Completed abx 12/9. Clinical and radiographic improvement. She  does still have some mild airspace disease, appears to be consistent with resolving pneumonia. She did have lymphadenopathy on CT imaging from her hospital stay, likely reactive, but will need to be evaluated to ensure resolution. Recommend repeating in 4 weeks for 6-8 week follow up; orders placed today. We will also recheck CBC today to ensure leukocytosis has resolved.  Felt that her  recurrent pneumonias are possibly related to GERD/aspiration. Referral to GI placed today for further evaluation as she is on optimal therapy with twice daily PPI and compliant with GERD reduction measures. 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 Recent peripheral eosinophils were nl. She has been vaccinated with pneumovax 13 and 23, most recently in 05/2021 - check IgG panel today. May also need to consider this could be related to ICS use?   Patient Instructions  Use flutter valve three times a day  Continue mucinex '600mg'$  twice a day until follow-up  Continue Advair 569mg- one puffs morning and evening Continue Singulair '10mg'$  at bedtime  Use Albuterol 1-2 puffs every 4-6 hours for breakthrough  Continue protonix 1 tab Twice daily for reflux  Sleep with head of bed elevated. Avoid eating 2-3 hours before bedtime. I have sent a referral to GI to evaluate for persistent reflux despite treatment.   Labs today   Follow up in 10-12 weeks with Dr. BLamonte Sakai If symptoms worsen, please contact office for sooner follow up or seek emergency care.    COPD (chronic obstructive pulmonary disease) Appears compensated on current regimen today. Continue Advair, singulair and PRN SABA.  Acute respiratory failure with hypoxia (HCC) Resolved. Walk without desaturations on room air. Ok to discontinue supplemental O2. Goal >88-90%.    I spent 38 minutes of dedicated to the care of this patient on the date of this encounter to include pre-visit review of records, face-to-face time with the patient discussing conditions above, post visit ordering of testing, clinical documentation with the electronic health record, making appropriate referrals as documented, and communicating necessary findings to members of the patients care team.  KClayton Bibles NP 06/06/2022  Pt aware and understands NP's role.

## 2022-06-10 ENCOUNTER — Other Ambulatory Visit: Payer: Self-pay

## 2022-06-10 DIAGNOSIS — J189 Pneumonia, unspecified organism: Secondary | ICD-10-CM

## 2022-06-11 LAB — CBC WITH DIFFERENTIAL/PLATELET
Absolute Monocytes: 722 cells/uL (ref 200–950)
Basophils Absolute: 74 cells/uL (ref 0–200)
Basophils Relative: 0.9 %
Eosinophils Absolute: 705 cells/uL — ABNORMAL HIGH (ref 15–500)
Eosinophils Relative: 8.6 %
HCT: 41.5 % (ref 35.0–45.0)
Hemoglobin: 14 g/dL (ref 11.7–15.5)
Lymphs Abs: 2444 cells/uL (ref 850–3900)
MCH: 30.4 pg (ref 27.0–33.0)
MCHC: 33.7 g/dL (ref 32.0–36.0)
MCV: 90 fL (ref 80.0–100.0)
MPV: 11.3 fL (ref 7.5–12.5)
Monocytes Relative: 8.8 %
Neutro Abs: 4256 cells/uL (ref 1500–7800)
Neutrophils Relative %: 51.9 %
Platelets: 232 10*3/uL (ref 140–400)
RBC: 4.61 10*6/uL (ref 3.80–5.10)
RDW: 12 % (ref 11.0–15.0)
Total Lymphocyte: 29.8 %
WBC: 8.2 10*3/uL (ref 3.8–10.8)

## 2022-06-11 LAB — STREP PNEUMONIAE 23 SEROTYPES IGG
Serotype 17 (17F): 0.8
Serotype 2 (2): 0.8
Serotype 20 (20): 1.2
Serotype 22 (22F): <0.3
Serotype 34 (10A): <0.3
Serotype 43 (11A): 0.5
Serotype 54 (15B): 9.5
Serotype 57 (19A): 20.9
Serotype 70 (33F): 1.7
Strep pneumo Type 12: <0.3
Strep pneumo Type 19: 11.3
Strep pneumo Type 4: 0.9
Strep pneumo Type 9: 0.7
Strep pneumoniae Type 1 Abs: 1
Strep pneumoniae Type 14 Abs: 15.4
Strep pneumoniae Type 18C Abs: 7.2
Strep pneumoniae Type 23F Abs: 8.6
Strep pneumoniae Type 3 Abs: 0.9
Strep pneumoniae Type 5 Abs: 4.2
Strep pneumoniae Type 6B Abs: 12.9
Strep pneumoniae Type 7F Abs: 1
Strep pneumoniae Type 8 Abs: 2
Strep pneumoniae Type 9N Abs: 0.3

## 2022-06-11 NOTE — Progress Notes (Signed)
Please notify patient that her total WBC count has normalized. She does have an elevated eosinophil count, which is a type of blood cell that the body's immune system produces, usually in response to allergies. This does make me concerned that her pneumonias could be related to this vs a true bacterial pneumonia. We will assess her CT scan, once complete. Ensure she has f/u with Dr. Lamonte Sakai afterwards. Thanks.

## 2022-06-12 NOTE — Telephone Encounter (Signed)
Called the pt to follow up with her since her last labs 05/29/22... she did not respond to her My Chart message although note she had viewed it.. she had at that time just before Xmas on 06/01/22.Marland Kitchen stopped her Jardiance due to being in the donut hole and running out and then she increased her lasix to 80 mg although in later in talking with her... she says she had been on the lasix 80 mg since prior to her previous labs.   She is currently on Lasix 80 mg daily... she does not have any SOB or peripheral edema.. she is having a Chest CT for Pulmonary planned for 07/07/22.   She says she needs labs for the CT and asked to have them combined... I advised her that would be about another 3 weeks but she says she prefers to wait since she is feeling well. She would like to have the labs on 06/30/22 so that she does not need new labs for the CT... I advised her to watch her NA intake. I will forward to Dr Harrington Challenger so she is aware.

## 2022-06-30 ENCOUNTER — Ambulatory Visit: Payer: PPO

## 2022-07-01 ENCOUNTER — Other Ambulatory Visit: Payer: Self-pay | Admitting: Internal Medicine

## 2022-07-01 ENCOUNTER — Other Ambulatory Visit: Payer: Self-pay | Admitting: *Deleted

## 2022-07-01 DIAGNOSIS — I5040 Unspecified combined systolic (congestive) and diastolic (congestive) heart failure: Secondary | ICD-10-CM

## 2022-07-01 DIAGNOSIS — Z79899 Other long term (current) drug therapy: Secondary | ICD-10-CM

## 2022-07-01 DIAGNOSIS — I480 Paroxysmal atrial fibrillation: Secondary | ICD-10-CM

## 2022-07-01 LAB — BASIC METABOLIC PANEL
BUN/Creatinine Ratio: 21 (ref 12–28)
BUN: 22 mg/dL (ref 8–27)
CO2: 26 mmol/L (ref 20–29)
Calcium: 9.3 mg/dL (ref 8.7–10.3)
Chloride: 98 mmol/L (ref 96–106)
Creatinine, Ser: 1.05 mg/dL — ABNORMAL HIGH (ref 0.57–1.00)
Glucose: 81 mg/dL (ref 70–99)
Potassium: 4.6 mmol/L (ref 3.5–5.2)
Sodium: 141 mmol/L (ref 134–144)
eGFR: 58 mL/min/{1.73_m2} — ABNORMAL LOW (ref >59)

## 2022-07-01 LAB — PRO B NATRIURETIC PEPTIDE: NT-Pro BNP: 315 pg/mL — ABNORMAL HIGH (ref 0–301)

## 2022-07-01 NOTE — Progress Notes (Unsigned)
Originally ordered 05/27/22 [id number]

## 2022-07-03 ENCOUNTER — Telehealth: Payer: Self-pay

## 2022-07-03 NOTE — Telephone Encounter (Signed)
-----  Message from Fay Records, MD sent at 07/01/2022 10:13 PM EST ----- Electrolyes and kidney function are OK   Fluid is still up a little   How is she feeling?   Keep on same meds for now

## 2022-07-03 NOTE — Telephone Encounter (Signed)
Pt advised her lab results and says she has been feeling well... no dyspnea and no edema... she will monitor and let su know if anything changes.

## 2022-07-04 NOTE — Telephone Encounter (Signed)
OK   Agree with plan

## 2022-07-07 ENCOUNTER — Ambulatory Visit (INDEPENDENT_AMBULATORY_CARE_PROVIDER_SITE_OTHER): Payer: PPO

## 2022-07-07 DIAGNOSIS — J189 Pneumonia, unspecified organism: Secondary | ICD-10-CM

## 2022-07-07 MED ORDER — IOHEXOL 300 MG/ML  SOLN
100.0000 mL | Freq: Once | INTRAMUSCULAR | Status: AC | PRN
  Administered 2022-07-07: 75 mL via INTRAVENOUS

## 2022-07-10 ENCOUNTER — Other Ambulatory Visit: Payer: Self-pay | Admitting: Nurse Practitioner

## 2022-07-10 DIAGNOSIS — Z1231 Encounter for screening mammogram for malignant neoplasm of breast: Secondary | ICD-10-CM

## 2022-07-15 NOTE — Progress Notes (Signed)
Please let patient know her CT chest is improved and previous pneumonia has near complete resolution, which is good news. Her scarring and nodules are stable. She can discuss timing of her next CT with Dr. Lamonte Sakai. Thanks!

## 2022-07-16 ENCOUNTER — Telehealth: Payer: Self-pay | Admitting: Emergency Medicine

## 2022-07-16 NOTE — Progress Notes (Signed)
ATC x1.  No answer.  LVM to return call. 

## 2022-07-16 NOTE — Telephone Encounter (Signed)
Spoke with patient in regards to CT results. She verbalized understanding. Nothing further needed.

## 2022-07-18 ENCOUNTER — Encounter: Payer: Self-pay | Admitting: Emergency Medicine

## 2022-07-18 ENCOUNTER — Ambulatory Visit (INDEPENDENT_AMBULATORY_CARE_PROVIDER_SITE_OTHER): Payer: PPO | Admitting: Emergency Medicine

## 2022-07-18 VITALS — BP 138/74 | HR 67 | Temp 97.9°F | Ht 66.0 in | Wt 212.4 lb

## 2022-07-18 DIAGNOSIS — J189 Pneumonia, unspecified organism: Secondary | ICD-10-CM

## 2022-07-18 DIAGNOSIS — J449 Chronic obstructive pulmonary disease, unspecified: Secondary | ICD-10-CM

## 2022-07-18 DIAGNOSIS — R918 Other nonspecific abnormal finding of lung field: Secondary | ICD-10-CM

## 2022-07-18 DIAGNOSIS — J301 Allergic rhinitis due to pollen: Secondary | ICD-10-CM

## 2022-07-18 DIAGNOSIS — K219 Gastro-esophageal reflux disease without esophagitis: Secondary | ICD-10-CM

## 2022-07-18 MED ORDER — ALBUTEROL SULFATE HFA 108 (90 BASE) MCG/ACT IN AERS: 1.0000 | INHALATION_SPRAY | Freq: Four times a day (QID) | RESPIRATORY_TRACT | 1 refills | Status: DC | PRN

## 2022-07-18 MED ORDER — BREZTRI AEROSPHERE 160-9-4.8 MCG/ACT IN AERO: 2.0000 | INHALATION_SPRAY | Freq: Two times a day (BID) | RESPIRATORY_TRACT | 0 refills | Status: DC

## 2022-07-18 NOTE — Progress Notes (Signed)
   Subjective:    Patient ID: Taylor [name], female    DOB: 1950/06/23, 72 y.o.   MRN: [id number]  HPI  ROV 07/18/2022 --72 year old woman with a history of COPD and chronic bronchitic symptoms.  Frequently treated for bronchitis/pneumonia.  Mild obstruction on her pulmonary function testing.  She is an abnormal CT scan of the chest with peribronchovascular cystic disease and some patchy upper lobe groundglass, stable on CT chest November 2023. She is on Advair, albuterol rarely unless she is acutely ill. She has intermittent reflux on PPI bid - a few times a week. Remains on singulair and zyrtec, still has congestion but does not like nasal sprays.   CT chest 07/07/2022 reviewed by me shows mild centrilobular emphysema, near complete interval resolution of her bilateral consolidations with some mild scattered residual groundglass present, stable scattered pulmonary nodules unchanged, largest 5 mm in the right middle lobe.  Decreased mediastinal and hilar lymph nodes   Review of Systems As per HPI     Objective:   Physical Exam  Vitals:   07/18/22 1454  BP: 138/74  Pulse: 67  Temp: 97.9 F (36.6 C)  TempSrc: Oral  SpO2: 98%  Weight: 212 lb 6.4 oz (96.3 kg)  Height: '5\' 6"'$  (1.676 m)   Gen: Pleasant, overweight woman, in no distress,  normal affect  ENT: No lesions,  mouth clear,  oropharynx clear, no postnasal drip  Neck: No JVD, no stridor  Lungs: No use of accessory muscles, coarse bilaterally, frequent coughing with a deep inspiration.  Rhonchi but no wheezing  Cardiovascular: RRR, heart sounds normal, no murmur or gallops, no peripheral edema  Musculoskeletal: No deformities, no cyanosis or clubbing  Neuro: alert, awake, non focal  Skin: Warm, no lesions or rash     Assessment & Plan:   COPD (chronic obstructive pulmonary disease) Recurrent chronic bronchitic symptoms.  She has airway distortion, scarring and bronchiectasis phenotype.  Will try changing her Advair to  [facility] to see if she gets more benefit.    We will try stopping your Advair Try starting Breztri 2 puffs twice a day.  Rinse and gargle after using.  Keep track of how you do on this medication.  If it improves your symptoms then we will continue it and order it through your pharmacy. Keep albuterol available to use 2 puffs if needed for shortness of breath, chest tightness, wheezing. Follow with APP in 3 to 4 weeks to discuss how you are doing on the new inhaler medication. Follow with Dr Lamonte Sakai in 6 months or sooner if you have any problems  Recurrent pneumonia Question occult aspiration.  She has scarring on CT chest that causes a bronchiectasis-style phenotype.  Attempting to treat rhinitis, GERD as well as possible.  Pulmonary nodules Some discrete pulmonary nodules intermixed with her chronic scar.  Stable on most recent CT, plan for a repeat in January 2025.  GERD (gastroesophageal reflux disease) Continue PPI twice daily  Allergic rhinitis She is on Zyrtec and Singulair.  She does not tolerate nasal sprays.   Baltazar Apo, MD, PhD 07/18/2022, 3:16 PM [facility] [phone] or if no answer before 7:00PM call [phone] For any issues after 7:00PM please call eLink [phone]

## 2022-07-18 NOTE — Assessment & Plan Note (Signed)
Continue PPI twice daily. ?

## 2022-07-18 NOTE — Assessment & Plan Note (Signed)
Recurrent chronic bronchitic symptoms.  She has airway distortion, scarring and bronchiectasis phenotype.  Will try changing her Advair to [facility] to see if she gets more benefit.    We will try stopping your Advair Try starting Breztri 2 puffs twice a day.  Rinse and gargle after using.  Keep track of how you do on this medication.  If it improves your symptoms then we will continue it and order it through your pharmacy. Keep albuterol available to use 2 puffs if needed for shortness of breath, chest tightness, wheezing. Follow with APP in 3 to 4 weeks to discuss how you are doing on the new inhaler medication. Follow with Dr Lamonte Sakai in 6 months or sooner if you have any problems

## 2022-07-18 NOTE — Assessment & Plan Note (Signed)
Question occult aspiration.  She has scarring on CT chest that causes a bronchiectasis-style phenotype.  Attempting to treat rhinitis, GERD as well as possible.

## 2022-07-18 NOTE — Patient Instructions (Signed)
We will try stopping your Advair Try starting Breztri 2 puffs twice a day.  Rinse and gargle after using.  Keep track of how you do on this medication.  If it improves your symptoms then we will continue it and order it through your pharmacy. Keep albuterol available to use 2 puffs if needed for shortness of breath, chest tightness, wheezing. Continue your pantoprazole twice a day Continue Singulair and Zyrtec as you have been taking them We reviewed your CT scan of the chest today.  We will plan to repeat your CT chest without contrast in January 2025 Follow with APP in 3 to 4 weeks to discuss how you are doing on the new inhaler medication. Follow with Dr Lamonte Sakai in 6 months or sooner if you have any problems

## 2022-07-18 NOTE — Assessment & Plan Note (Signed)
She is on Zyrtec and Singulair.  She does not tolerate nasal sprays.

## 2022-07-18 NOTE — Assessment & Plan Note (Signed)
Some discrete pulmonary nodules intermixed with her chronic scar.  Stable on most recent CT, plan for a repeat in January 2025.

## 2022-07-18 NOTE — Addendum Note (Signed)
Addended by: Dierdre Highman on: 07/18/2022 03:36 PM   Modules accepted: Orders

## 2022-07-21 ENCOUNTER — Encounter: Payer: Self-pay | Admitting: Emergency Medicine

## 2022-07-21 NOTE — Telephone Encounter (Signed)
Mychart message sent by pt: Taylor Basset "Jayn"  P [facility] (supporting Collene Gobble, MD)33 minutes ago (2:33 PM)    Hi. I forgot to ask Dr Lamonte Sakai to send a letter to [place] about termination of my oxygen equipment. I got it when I was in the hospital in December with pneumonia. I haven't used oxygen for almost a month. Can you please contact [facility] and let them know i no longer need the oxygen equipment? Thank you,  Taylor [name]     Dr. Lamonte Sakai, please advise.

## 2022-07-22 ENCOUNTER — Other Ambulatory Visit: Payer: Self-pay

## 2022-07-22 DIAGNOSIS — J449 Chronic obstructive pulmonary disease, unspecified: Secondary | ICD-10-CM

## 2022-07-22 NOTE — Telephone Encounter (Signed)
Yes, OK to stop her oxygen

## 2022-07-25 ENCOUNTER — Encounter: Payer: Self-pay | Admitting: Emergency Medicine

## 2022-08-01 ENCOUNTER — Ambulatory Visit (INDEPENDENT_AMBULATORY_CARE_PROVIDER_SITE_OTHER): Payer: PPO

## 2022-08-01 ENCOUNTER — Ambulatory Visit (INDEPENDENT_AMBULATORY_CARE_PROVIDER_SITE_OTHER): Payer: PPO | Admitting: Nurse Practitioner

## 2022-08-01 ENCOUNTER — Encounter: Payer: Self-pay | Admitting: Nurse Practitioner

## 2022-08-01 VITALS — BP 118/60 | HR 77 | Temp 97.8°F | Ht 66.0 in | Wt 216.2 lb

## 2022-08-01 DIAGNOSIS — N3000 Acute cystitis without hematuria: Secondary | ICD-10-CM

## 2022-08-01 DIAGNOSIS — J441 Chronic obstructive pulmonary disease with (acute) exacerbation: Secondary | ICD-10-CM

## 2022-08-01 DIAGNOSIS — R350 Frequency of micturition: Secondary | ICD-10-CM

## 2022-08-01 DIAGNOSIS — J189 Pneumonia, unspecified organism: Secondary | ICD-10-CM

## 2022-08-01 MED ORDER — PREDNISONE 20 MG PO TABS: 40.0000 mg | ORAL_TABLET | Freq: Every day | ORAL | 0 refills | Status: AC

## 2022-08-01 MED ORDER — SULFAMETHOXAZOLE-TRIMETHOPRIM 800-160 MG PO TABS: 1.0000 | ORAL_TABLET | Freq: Two times a day (BID) | ORAL | 0 refills | Status: AC

## 2022-08-01 NOTE — Assessment & Plan Note (Signed)
AECOPD with increased productive cough, dyspnea and bronchospasm. CXR today without superimposed infection. We will treat her with prednisone burst. She has multiple drug allergies. Will cover her with empiric bactrim given UTI symptoms as well. Medication education provided. She has recurrent bronchitis/flares. No improvement with Breztri compared to Advair. Maximize bronchodilator/mucociliary clearance therapies. May want to consider Dupixent as potential treatment option given her significant eosinophilia? Will discuss with Dr. Lamonte Sakai at follow up. Action plan in place.  Patient Instructions  Use flutter valve three times a day  Restart mucinex '600mg'$  twice a day for chest congestion/cough Ok to restart Advair 574mg- one puffs morning and evening Continue Singulair '10mg'$  at bedtime  Use Albuterol 1-2 puffs or 3 mL every 4-6 hours for breakthrough. Use neb treatments at least twice daily until symptoms improve  Continue protonix 1 tab Twice daily for reflux   Prednisone 40 mg daily for 5 days. Take in AM with food. Bactrim DS twice daily for 5 days. Take with food.   Sleep with head of bed elevated. Avoid eating 2-3 hours before bedtime. I have sent a referral to GI to evaluate for persistent reflux despite treatment.   Urine test today    Follow up in 2 weeks with Dr. BLamonte Sakaior KAlanson Aly If symptoms worsen, please contact office for sooner follow up or seek emergency care.

## 2022-08-01 NOTE — Assessment & Plan Note (Signed)
Symptoms of UTI. We will check UA today. Non-toxic appearing. See above plan.

## 2022-08-01 NOTE — Progress Notes (Signed)
$'@Patient'V$  ID: Taylor Taylor [name] Taylor [name], female    DOB: 22-Nov-1950, 72 y.o.   MRN: [id number]  Chief Complaint  Patient presents with   Follow-up    Pt states that she started having a productive cough x 2 weeks. Pt states that for the past 2 days she has felt bad and SOB.     Referring provider: [place], Julie M, NP  HPI: 72 year old female, former smoker followed for COPD and recurrent pneumonia.  She is a patient Taylor Taylor [name] Taylor [name] and Taylor [name] seen in office 07/18/2022.  Past medical history significant for hypertension, PAF, allergic rhinitis, GERD.  TEST/EVENTS:  04/23/2022 PFT: FVC 96, FEV1 92, ratio 71, TLC 94, DLCOcor 74. No BD. 05/10/2022 CT chest with contrast: Heart size is mildly enlarged.  Mediastinal and hilar lymphadenopathy favor reactive lymph nodes.  Consolidations with increased pulmonary interstitium identified in bilateral upper lobes, bilateral lower lobes with relative sparing of the right middle lobe.  Findings are consistent with multifocal pneumonia.  4 mm focal peripheral nodule in the right middle lobe unchanged.  Small right pleural effusion.  05/16/2022: Taylor Taylor [name] Last NP for hospital follow-up.  She was hospitalized 05/10/2022 through 05/14/2022 for multifocal pneumonia and acute respiratory failure.  She was treated with IV antibiotics and transition to cefadroxil to complete her course upon discharge.  Discharged on supplemental oxygen and steroid taper.  Suspected that GERD was playing a role in recurrent pneumonia/aspiration.  Change PPI to twice daily and reviewed GERD measures.  She was noted to have rapid A-fib on admission due to her acute illness, converted back to sinus rhythm.  Advised her to follow-up with cardiology for further monitoring.  During her follow-up visit, she reported that she was feeling better.  She has 1 day left of antibiotics.  Still feeling more fatigued than her baseline.  Also has some postnasal drip symptoms.  Cough feels like it is loosened up.  Taking  Mucinex once a day.  Wearing oxygen 24/7.  She is encouraged to continue mucociliary clearance therapies.  Continued on Advair and Singulair.  Follow-up in 4 weeks with repeat chest x-ray.  06/06/2022: OV with Taylor Taylor [name] Wehmeyer NP for follow-up.  Since she was here Taylor [name], she has completed all of her antibiotics.  She continues to feel like she is slowly improving.  Energy levels are starting to return although she still does not quite feel back to baseline.  Her cough is also improved.  Minimal with occasional sputum production.  Breathing feels like it is at her baseline.  She denies any increased chest congestion, wheezing, hemoptysis, fevers, chills.  Has not been wearing her oxygen.  Oxygen levels have been maintaining above 90%.  She is taking Protonix twice daily.  She is not sure if this is controlling her GERD symptoms.  Still occasionally has some breakthrough.  She has had swallow study in the past without any overt aspiration.  Has never had GI workup.  She is up-to-date on pneumonia vaccines.  She is currently wearing a heart monitor to ensure she does not have any recurrent episodes of A-fib.  Plans to follow-up with cardiology afterwards.  07/18/2022: OV with Taylor Taylor [name] Taylor [name]. Frequently treated for bronchitis/pna. Mild obstruction on previous PFT. Abnormal CT with peribronchovascular cystic disease and some patchy upper lobe ground glass, stable November 2023 and improved January 2024. She is on Advair. Rarely uses albuterol unless she is sick. Intermittent reflux on PPI bid a few times a week. Remains on singualir and zyrtec; still has congestion but does  not like nasal sprays. Trial change to [employer]. Question aspiration with scarring and btx style phenotype on imaging. Stable nodules - repeat January 2025.   08/01/2022: Today - follow up Patient presents today for intended follow up but she is also having some acute symptoms. She has been dealing with a productive cough for the Taylor [name] week or two. Started shortly  after her Taylor [name] visit. She feels like her symptoms have gotten worse over the Taylor [name] two days. Increased shortness of breath, chest congestion, wheezing, and fatigue. Sputum is yellow to green. Doesn't have any increased nasal congestion or drainage. She's also having some right lower flank pain, urinary frequency and dysuria. She denies fevers, chills, nausea, vomiting, hemoptysis, hematuria. She hasn't noticed a huge difference with the Breztri compared to the Advair. She did think about using her albuterol yesterday but ended up not needing it.   Allergies  Allergen Reactions   Avelox [Moxifloxacin Hcl In Nacl] Hives   Linezolid Rash   Venlafaxine Nausea Only and Rash   Rosuvastatin Other (See Comments)    myalgias   Cephalexin Itching and Rash   Clindamycin Diarrhea   Nitrofurantoin Itching and Rash   Other Rash    Brewer yeast and Bakers yeast causes cracks in mouth stomach bloating    Penicillins Itching and Rash    Has patient had a PCN reaction causing anaphylaxis, immediate rash, facial/tongue/throat swelling, SOB or lightheadedness with hypotension? no Has patient had a PCN reaction causing severe rash involving mucus membranes or skin necrosis?  no Has patient had a PCN reaction that required hospitilization?no  Has patient had a PCN reaction occurring within the Taylor [name] 10 years?  no If all of the above answers are "no" then may proceed with cephalosporin use.   Statins Other (See Comments)    Muscle pain    Immunization History  Administered Date(s) Administered   Covid-19, Mrna,Vaccine(Spikevax)101yr and older 03/29/2022   DTP 06/26/2005   Fluad Quad(high Dose 65+) 04/09/2022   Hepatitis A 06/20/2014, 12/19/2014   Hepatitis A, Ped/Adol-2 Dose 06/20/2014, 12/19/2014   Influenza, High Dose Seasonal PF 02/21/2018   Influenza,inj,Quad PF,6+ Mos 01/22/2018   Influenza,inj,quad, With Preservative 01/08/2019, 04/01/2019   Influenza-Unspecified 03/05/2012, 03/09/2013, 04/18/2013,  06/07/2014, 07/05/2015, 03/21/2016, 01/22/2018, 01/08/2019, 04/01/2019   PFIZER Comirnaty(Gray Top)Covid-19 Tri-Sucrose Vaccine 07/18/2019, 08/12/2019, 09/28/2020, 03/02/2021   PFIZER(Purple Top)SARS-COV-2 Vaccination 07/18/2019, 08/12/2019   Pneumococcal Conjugate-13 05/11/2018, 05/14/2021   Pneumococcal Polysaccharide-23 06/20/2014   Tdap 01/19/2012   Unspecified SARS-COV-2 Vaccination 07/18/2019, 08/12/2019   Zoster Recombinat (Shingrix) 09/23/2017, 01/22/2018   Zoster, Unspecified 09/23/2017, 01/22/2018    Past Medical History:  Diagnosis Date   Anxiety    Arthritis    "hands, neck, back" (07/22/2013)   Atrial fibrillation ([facility]    CHEST DISCOMFORT    Chronic back pain    "started in lower back; getting to be all over my back" (07/22/2013)   Chronic bronchitis ([facility]    "I've had it 2 years in a row; do have some trouble breathing at times; use an inhaler prn; not asthma" (07/22/2013)   Chronic neck pain    COPD (chronic obstructive pulmonary disease) ([facility]    DDD (degenerative disc disease), cervical    DDD (degenerative disc disease), lumbar    Depression    Dysrhythmia    GERD (gastroesophageal reflux disease)    Hepatitis 1971   "don't know if it was A or B" (07/22/2013)   HYPERLIPIDEMIA-MIXED 2005   "went away after I started taking fish oil" (07/22/2013)  Hypothyroidism    Palpitations    Pneumonia 2013   Type II diabetes mellitus ([place])    "diet controlled" (07/22/2013)    Tobacco History: Social History   Tobacco Use  Smoking Status Former   Packs/day: 1.00   Years: 40.00   Total pack years: 40.00   Types: Cigarettes   Quit date: 12/22/2007   Years since quitting: 14.6  Smokeless Tobacco Never   Counseling given: Not Answered   Outpatient Medications Prior to Visit  Medication Sig Dispense Refill   acetaminophen (TYLENOL) 500 MG tablet Take 1,000 mg by mouth every 6 (six) hours as needed for moderate pain or headache.      albuterol (PROVENTIL) (2.5  MG/3ML) 0.083% nebulizer solution Take 3 mLs (2.5 mg total) by nebulization every 4 (four) hours as needed for wheezing or shortness of breath. 75 mL 2   albuterol (VENTOLIN HFA) 108 (90 Base) MCG/ACT inhaler Inhale 1 puff into the lungs every 6 (six) hours as needed for wheezing or shortness of breath. 1 each 1   Ascorbic Acid (VITAMIN C) 1000 MG tablet Take 1,000 mg by mouth daily.     Budeson-Glycopyrrol-Formoterol (BREZTRI AEROSPHERE) 160-9-4.8 MCG/ACT AERO Inhale 2 puffs into the lungs in the morning and at bedtime. 5.9 g 0   buPROPion (WELLBUTRIN XL) 150 MG 24 hr tablet Take 150 mg by mouth 2 (two) times daily.     cetirizine (ZYRTEC) 10 MG tablet Take 10 mg by mouth daily.      Cinnamon 500 MG TABS Take 1,000 mg by mouth daily.     ESTRADIOL TD Apply 1 mL topically daily. Estradiol 7.'5mg'$ /gm cream ( apply to thighs)     ezetimibe (ZETIA) 10 MG tablet Take 1 tablet (10 mg total) by mouth daily. 90 tablet 3   FLUoxetine (PROZAC) 20 MG tablet Take 40 mg by mouth daily.     Fluticasone-Salmeterol (ADVAIR) 500-50 MCG/DOSE AEPB Inhale 1 puff into the lungs in the morning and at bedtime.      furosemide (LASIX) 80 MG tablet Take 80 mg by mouth daily.     Guaifenesin 1200 MG TB12 Take 1,200 mg by mouth daily.     hydrOXYzine (ATARAX/VISTARIL) 50 MG tablet Take 50 mg by mouth daily as needed for itching.      ketoconazole (NIZORAL) 2 % cream Apply 1 application topically daily.      metoprolol tartrate (LOPRESSOR) 25 MG tablet Take 1 tablet (25 mg total) by mouth 2 (two) times daily. 180 tablet 2   montelukast (SINGULAIR) 10 MG tablet Take 10 mg by mouth at bedtime.     OIL OF OREGANO PO Take 1-2 capsules by mouth daily as needed (cold symptoms).      OVER THE COUNTER MEDICATION Take 2 tablets by mouth daily at 6 (six) AM. SACROMYCES bouliardi (probiotic)     Oxycodone HCl 10 MG TABS Take 10 mg by mouth 5 (five) times daily as needed (pain).     pantoprazole (PROTONIX) 40 MG tablet Take 1 tablet (40  mg total) by mouth 2 (two) times daily. 60 tablet 5   potassium chloride (KLOR-CON M10) 10 MEQ tablet Take 1 tablet (10 mEq total) by mouth daily. 90 tablet 3   pregabalin (LYRICA) 100 MG capsule Take 100 mg by mouth 3 (three) times daily.     progesterone (PROMETRIUM) 100 MG capsule Take 300 mg by mouth every evening.      pseudoephedrine (SUDAFED) 120 MG 12 hr tablet Take 120 mg by  mouth daily.     TESTOSTERONE TD Place 1-2 application onto the skin See admin instructions. Testosterone 2% cream. Apply 1 click's worth of testosterone cream on Sun, Tues, Thurs, and Sat. Apply 2 click's worth on Mon, Wed, and Fri     thyroid (ARMOUR) 60 MG tablet Take 30-120 mg by mouth See admin instructions. Take 120 mg in the morning and 30 mg in the afternoon     tiZANidine (ZANAFLEX) 4 MG tablet Take 2-4 mg by mouth 4 (four) times daily as needed for muscle spasms. 2 mg 3 times daily - 4 mg at bedtime     TURMERIC PO Take 1,440 mg by mouth every evening.     Vitamin D-Vitamin K (VITAMIN K2-VITAMIN D3 PO) Take 1 capsule by mouth every evening.     doxepin (SINEQUAN) 10 MG capsule Take 10 mg by mouth at bedtime. (Patient not taking: Reported on 07/18/2022)     Omega-3 Fatty Acids (FISH OIL) 1000 MG CAPS Take 2,000 mg by mouth daily.     No facility-administered medications prior to visit.     Review of Systems:   Constitutional: No weight loss or gain, night sweats, fevers, chills. +fatigue, lassitude  HEENT: No headaches, difficulty swallowing, tooth/dental problems, or sore throat. No sneezing, itching, ear ache. +nasal congestion, post nasal drip (baseline) CV:  No chest pain, orthopnea, PND, swelling in lower extremities, anasarca, dizziness, palpitations, syncope Resp: +shortness of breath with exertion; productive cough; wheezing. No hemoptysis. No chest wall deformity GI:  No heartburn, indigestion, abdominal pain, nausea, vomiting, loss of appetite GU: +dysuria, frequency, flank pain. No hematuria.    Skin: No rash, lesions, ulcerations MSK:  No joint pain or swelling.   Neuro: No dizziness or lightheadedness.  Psych: No depression or anxiety. Mood stable.     Physical Exam:  BP 118/60 (BP Location: Right Arm, Patient Position: Sitting, Cuff Size: Normal)   Pulse 77   Temp 97.8 F (36.6 C) (Oral)   Ht '5\' 6"'$  (1.676 [name])   Wt 216 lb 3.2 oz (98.1 kg)   SpO2 97%   BMI 34.90 kg/[name]   GEN: Pleasant, interactive, well-kempt; obese; in no acute distress HEENT:  Normocephalic and atraumatic. PERRLA. Sclera white. Nasal turbinates pink, moist and patent bilaterally. No rhinorrhea present. Oropharynx pink and moist, without exudate or edema. No lesions, ulcerations, or postnasal drip.  NECK:  Supple w/ fair ROM. No JVD present. Normal carotid impulses w/o bruits. Thyroid symmetrical with no goiter or nodules palpated. No lymphadenopathy.   CV: RRR, no [name]/r/g, no peripheral edema. Pulses intact, +2 bilaterally. No cyanosis, pallor or clubbing. PULMONARY:  Unlabored, regular breathing. Scattered rhonchi bilaterally A&P. No accessory muscle use.  GI: BS present and normoactive. Soft, non-tender to palpation. No organomegaly or masses detected. No CVA tenderness MSK: No erythema, warmth or tenderness. Cap refil <2 sec all extrem. No deformities or joint swelling noted.  Neuro: A/Ox3. No focal deficits noted.   Skin: Warm, no lesions or rashe Psych: Normal affect and behavior. Judgement and thought content appropriate.     Lab Results:  CBC    Component Value Date/Time   WBC 8.2 06/06/2022 1600   RBC 4.61 06/06/2022 1600   HGB 14.0 06/06/2022 1600   HGB 14.0 11/08/2018 1537   HCT 41.5 06/06/2022 1600   HCT 41.0 11/08/2018 1537   PLT 232 06/06/2022 1600   PLT 306 11/08/2018 1537   MCV 90.0 06/06/2022 1600   MCV 86 11/08/2018 1537   MCH  30.4 06/06/2022 1600   MCHC 33.7 06/06/2022 1600   RDW 12.0 06/06/2022 1600   RDW 13.1 11/08/2018 1537   LYMPHSABS 2,444 06/06/2022 1600   MONOABS  0.7 05/12/2022 0333   EOSABS 705 (H) 06/06/2022 1600   BASOSABS 74 06/06/2022 1600    BMET    Component Value Date/Time   NA 141 06/30/2022 1143   K 4.6 06/30/2022 1143   CL 98 06/30/2022 1143   CO2 26 06/30/2022 1143   GLUCOSE 81 06/30/2022 1143   GLUCOSE 106 (H) 05/14/2022 0403   BUN 22 06/30/2022 1143   CREATININE 1.05 (H) 06/30/2022 1143   CALCIUM 9.3 06/30/2022 1143   GFRNONAA >60 05/14/2022 0403   GFRAA 60 (L) 09/21/2019 0708    BNP    Component Value Date/Time   BNP 292.9 (H) 05/10/2022 1307     Imaging:  DG Chest 2 View  Result Date: 08/01/2022 CLINICAL DATA:  Productive cough EXAM: CHEST - 2 VIEW COMPARISON:  CT chest 07/07/2022 FINDINGS: Enlargement of cardiac silhouette. Mediastinal contours and pulmonary vascularity normal. Bronchitic changes with accentuation of interstitial markings bilaterally greater in the perihilar regions of the upper lobes, similar to prior CT. No definite acute infiltrate, pleural effusion, or pneumothorax. Prior cervical spine fusion. IMPRESSION: Chronic bronchitic and interstitial changes. No definite acute abnormalities. Electronically Signed   By: Lavonia Dana [name].D.   On: 08/01/2022 15:23   CT Chest W Contrast  Result Date: 07/08/2022 CLINICAL DATA:  Follow-up EXAM: CT CHEST WITH CONTRAST TECHNIQUE: Multidetector CT imaging of the chest was performed during intravenous contrast administration. RADIATION DOSE REDUCTION: This exam was performed according to the departmental dose-optimization program which includes automated exposure control, adjustment of the mA and/or kV according to patient size and/or use of iterative reconstruction technique. CONTRAST:  41m OMNIPAQUE IOHEXOL 300 MG/ML  SOLN COMPARISON:  Multiple priors, most recent chest CT dated May 10, 2022 FINDINGS: Cardiovascular: Normal heart size. No pericardial effusion. Normal caliber thoracic aorta with mild calcified plaque. No suspicious filling defects of the pulmonary  arteries. Mediastinum/Nodes: Esophagus and thyroid are unremarkable. Interval decreased size of mediastinal and hilar lymph nodes. Reference subcarinal lymph node measuring 1.1 cm in short axis on series 2, image 61, previously 1.8 cm. Lungs/Pleura: Central airways are patent. Mild centrilobular emphysema. Near complete interval resolution of bilateral consolidations with mild scattered residual ground-glass opacities. Peribronchovascular reticular and ground-glass opacities of the upper lungs are unchanged when compared with more remote priors and likely due to scarring. With stable scattered solid pulmonary nodules. Largest is a subpleural nodule of the right middle lobe measuring 5 mm on series 3, image 66. Upper Abdomen: Partially visualized simple appearing cyst of the left kidney, no specific follow-up imaging is recommended for this lesion. No acute abnormality. Musculoskeletal: No chest wall abnormality. No acute or significant osseous findings. IMPRESSION: 1. Near complete interval resolution of bilateral consolidations with mild scattered residual ground-glass opacities, findings are compatible with resolving infectious or inflammatory process. 2. Interval decreased size of mediastinal and hilar lymph nodes, consistent with resolving reactive adenopathy. 3. Stable scattered solid pulmonary nodules. Recommend return to annual lung cancer screening for continued surveillance. 4. Aortic Atherosclerosis (ICD10-I70.0) and Emphysema (ICD10-J43.9). Electronically Signed   By: LYetta GlassmanM.D.   On: 07/08/2022 09:32   LONG TERM MONITOR (3-14 DAYS)  Result Date: 07/03/2022 Patch Wear Time:  13 days and 21 hours (2023-12-28T10:59:39-0500 to 2024-01-11T08:46:13-0500) Predominant rhythm  Sinus rhythm  Rates 51 to 90 bpm   Average HR  63 bpm.   Short bust SVT   fastest at 164 bpm (12 beats), longest at 128 bpm for 19 beats   Rare PVC, rare PAC    Triggered events corresponded to SR, SR with PAC, SR with PVC  ___________________________________________________________________________ ________________________________ Patient had a min HR of 51 bpm, max HR of 164 bpm, and avg HR of 63 bpm. Predominant underlying rhythm was Sinus Rhythm. 20 Supraventricular Tachycardia runs occurred, the run with the fastest interval lasting 12 beats with a max rate of 164 bpm, the longest lasting 19 beats with an avg rate of 128 bpm. Some episodes of Supraventricular Tachycardia may be possible Atrial Tachycardia with variable block. Isolated SVEs were rare (<1.0%), SVE Couplets were rare (<1.0%), and SVE Triplets were rare (<1.0%). Isolated VEs were rare (<1.0%), VE Couplets were rare (<1.0%), and no VE Triplets were present.    iohexol (OMNIPAQUE) 300 MG/ML solution 100 mL     Date Action Dose Route User   07/07/2022 1321 Contrast Given 75 mL Intravenous Delbert Phenix D          Latest Ref Rng & Units 04/23/2022    2:58 PM  PFT Results  FVC-Pre L 3.19   FVC-Predicted Pre % 96   FVC-Post L 3.27   FVC-Predicted Post % 98   Pre FEV1/FVC % % 73   Post FEV1/FCV % % 71   FEV1-Pre L 2.32   FEV1-Predicted Pre % 92   FEV1-Post L 2.31   DLCO uncorrected ml/min/mmHg 15.61   DLCO UNC% % 74   DLCO corrected ml/min/mmHg 15.61   DLCO COR %Predicted % 74   DLVA Predicted % 85   TLC L 5.05   TLC % Predicted % 94   RV % Predicted % 77     No results found for: "NITRICOXIDE"      Assessment & Plan:   COPD with acute exacerbation ([place]) AECOPD with increased productive cough, dyspnea and bronchospasm. CXR today without superimposed infection. We will treat her with prednisone burst. She has multiple drug allergies. Will cover her with empiric bactrim given UTI symptoms as well. Medication education provided. She has recurrent bronchitis/flares. No improvement with Breztri compared to Advair. Maximize bronchodilator/mucociliary clearance therapies. May want to consider Dupixent as potential treatment option given her  significant eosinophilia? Will discuss with Taylor Taylor [name] Taylor [name] at follow up. Action plan in place.  Patient Instructions  Use flutter valve three times a day  Restart mucinex '600mg'$  twice a day for chest congestion/cough Ok to restart Advair 583mg- one puffs morning and evening Continue Singulair '10mg'$  at bedtime  Use Albuterol 1-2 puffs or 3 mL every 4-6 hours for breakthrough. Use neb treatments at least twice daily until symptoms improve  Continue protonix 1 tab Twice daily for reflux   Prednisone 40 mg daily for 5 days. Take in AM with food. Bactrim DS twice daily for 5 days. Take with food.   Sleep with head of bed elevated. Avoid eating 2-3 hours before bedtime. I have sent a referral to GI to evaluate for persistent reflux despite treatment.   Urine test today    Follow up in 2 weeks with Dr. BLamonte Sakaior KAlanson Aly If symptoms worsen, please contact office for sooner follow up or seek emergency care.    UTI (urinary tract infection) Symptoms of UTI. We will check UA today. Non-toxic appearing. See above plan.     I spent 38 minutes of dedicated to the care of this patient on the date of  this encounter to include pre-visit review of records, face-to-face time with the patient discussing conditions above, post visit ordering of testing, clinical documentation with the electronic health record, making appropriate referrals as documented, and communicating necessary findings to members of the patients care team.  Clayton Bibles, NP 08/01/2022  Pt aware and understands NP's role.

## 2022-08-01 NOTE — Patient Instructions (Addendum)
Use flutter valve three times a day  Restart mucinex '600mg'$  twice a day for chest congestion/cough Ok to restart Advair 543mg- one puffs morning and evening Continue Singulair '10mg'$  at bedtime  Use Albuterol 1-2 puffs or 3 mL every 4-6 hours for breakthrough. Use neb treatments at least twice daily until symptoms improve  Continue protonix 1 tab Twice daily for reflux   Prednisone 40 mg daily for 5 days. Take in AM with food. Bactrim DS twice daily for 5 days. Take with food.   Sleep with head of bed elevated. Avoid eating 2-3 hours before bedtime. I have sent a referral to GI to evaluate for persistent reflux despite treatment.   Urine test today    Follow up in 2 weeks with Dr. BLamonte Sakaior KAlanson Aly If symptoms worsen, please contact office for sooner follow up or seek emergency care.

## 2022-08-01 NOTE — Progress Notes (Signed)
Can you let patient know CXR looks similar to previous. No acute change. Chronic bronchitis. Katie sent in Bactrim. Make sure she is taking Mucinex twice daily.

## 2022-08-02 LAB — URINALYSIS, ROUTINE W REFLEX MICROSCOPIC
Bilirubin Urine: NEGATIVE
Hgb urine dipstick: NEGATIVE
Ketones, ur: NEGATIVE
Leukocytes,Ua: NEGATIVE
Nitrite: NEGATIVE
Protein, ur: NEGATIVE
Specific Gravity, Urine: 1.021 (ref 1.001–1.035)
pH: 6.5 (ref 5.0–8.0)

## 2022-08-02 LAB — URINE CULTURE
MICRO NUMBER:: 14607442
SPECIMEN QUALITY:: ADEQUATE

## 2022-08-06 NOTE — Progress Notes (Signed)
UA negative. Thanks

## 2022-08-06 NOTE — Progress Notes (Signed)
She did have some glucose in her urine so would recommend she follow up with her PCP regarding this. Thanks.

## 2022-08-15 ENCOUNTER — Encounter: Payer: Self-pay | Admitting: Nurse Practitioner

## 2022-08-15 ENCOUNTER — Ambulatory Visit: Payer: PPO | Admitting: Nurse Practitioner

## 2022-08-15 VITALS — BP 120/60 | HR 64 | Temp 98.1°F | Ht 66.0 in | Wt 212.6 lb

## 2022-08-15 DIAGNOSIS — J301 Allergic rhinitis due to pollen: Secondary | ICD-10-CM | POA: Diagnosis not present

## 2022-08-15 DIAGNOSIS — J189 Pneumonia, unspecified organism: Secondary | ICD-10-CM

## 2022-08-15 DIAGNOSIS — K219 Gastro-esophageal reflux disease without esophagitis: Secondary | ICD-10-CM | POA: Diagnosis not present

## 2022-08-15 DIAGNOSIS — J4489 Other specified chronic obstructive pulmonary disease: Secondary | ICD-10-CM

## 2022-08-15 MED ORDER — SPIRIVA RESPIMAT 2.5 MCG/ACT IN AERS: 2.0000 | INHALATION_SPRAY | Freq: Every day | RESPIRATORY_TRACT | 0 refills | Status: DC

## 2022-08-15 MED ORDER — FLUTICASONE-SALMETEROL 500-50 MCG/ACT IN AEPB: 1.0000 | INHALATION_SPRAY | Freq: Two times a day (BID) | RESPIRATORY_TRACT | 3 refills | Status: DC

## 2022-08-15 MED ORDER — SPIRIVA RESPIMAT 2.5 MCG/ACT IN AERS: 2.0000 | INHALATION_SPRAY | Freq: Every day | RESPIRATORY_TRACT | 5 refills | Status: DC

## 2022-08-15 NOTE — Assessment & Plan Note (Signed)
Continue twice daily PPI. Attend GI referral.

## 2022-08-15 NOTE — Patient Instructions (Signed)
Use flutter valve three times a day  Continue mucinex '600mg'$  twice a day for chest congestion/cough Ok to restart Advair 537mg- one puffs morning and evening Add Spiriva 2 puffs daily Continue Singulair '10mg'$  at bedtime  Use Albuterol 1-2 puffs or 3 mL every 4-6 hours for breakthrough. Use neb treatments at least twice daily until symptoms improve  Continue protonix 1 tab Twice daily for reflux   Sleep with head of bed elevated. Avoid eating 2-3 hours before bedtime. Attend appointment with GI once scheduled   Follow up with your PCP regarding sugar in your urine. They will likely need to obtain further testing since you have been prediabetic in the past.   Follow up in 6 weeks with Dr. BLamonte Sakaior KAlanson Alyto see how SStann Oreis working. If symptoms worsen, please contact office for sooner follow up or seek emergency care.

## 2022-08-15 NOTE — Progress Notes (Signed)
$'@Patient'N$  ID: Taylor [name], female    DOB: 1950-07-14, 72 y.o.   MRN: [id number]  Chief Complaint  Patient presents with   Follow-up    Patient's breathing is better in the past week.  She does not like the Morgan and would like to go back to the Advair 500/50 and get it in a 3 month supply.    Referring provider: [place], Julie M, NP  HPI: 72 year old female, former smoker followed for COPD and recurrent pneumonia.  She is a patient Dr. Agustina [name] and [name] seen in office 08/01/2022.  Past medical history significant for hypertension, PAF, allergic rhinitis, GERD.  TEST/EVENTS:  04/23/2022 PFT: FVC 96, FEV1 92, ratio 71, TLC 94, DLCOcor 74. No BD. 05/10/2022 CT chest with contrast: Heart size is mildly enlarged.  Mediastinal and hilar lymphadenopathy favor reactive lymph nodes.  Consolidations with increased pulmonary interstitium identified in bilateral upper lobes, bilateral lower lobes with relative sparing of the right middle lobe.  Findings are consistent with multifocal pneumonia.  4 mm focal peripheral nodule in the right middle lobe unchanged.  Small right pleural effusion.  05/16/2022: Taylor Last NP for hospital follow-up.  She was hospitalized 05/10/2022 through 05/14/2022 for multifocal pneumonia and acute respiratory failure.  She was treated with IV antibiotics and transition to cefadroxil to complete her course upon discharge.  Discharged on supplemental oxygen and steroid taper.  Suspected that GERD was playing a role in recurrent pneumonia/aspiration.  Change PPI to twice daily and reviewed GERD measures.  She was noted to have rapid A-fib on admission due to her acute illness, converted back to sinus rhythm.  Advised her to follow-up with cardiology for further monitoring.  During her follow-up visit, she reported that she was feeling better.  She has 1 day left of antibiotics.  Still feeling more fatigued than her baseline.  Also has some postnasal drip symptoms.  Cough feels like  it is loosened up.  Taking Mucinex once a day.  Wearing oxygen 24/7.  She is encouraged to continue mucociliary clearance therapies.  Continued on Advair and Singulair.  Follow-up in 4 weeks with repeat chest x-ray.  06/06/2022: OV with Taylor Pardy NP for follow-up.  Since she was here [name], she has completed all of her antibiotics.  She continues to feel like she is slowly improving.  Energy levels are starting to return although she still does not quite feel back to baseline.  Her cough is also improved.  Minimal with occasional sputum production.  Breathing feels like it is at her baseline.  She denies any increased chest congestion, wheezing, hemoptysis, fevers, chills.  Has not been wearing her oxygen.  Oxygen levels have been maintaining above 90%.  She is taking Protonix twice daily.  She is not sure if this is controlling her GERD symptoms.  Still occasionally has some breakthrough.  She has had swallow study in the past without any overt aspiration.  Has never had GI workup.  She is up-to-date on pneumonia vaccines.  She is currently wearing a heart monitor to ensure she does not have any recurrent episodes of A-fib.  Plans to follow-up with cardiology afterwards.  07/18/2022: OV with Dr. Lamonte [name]. Frequently treated for bronchitis/pna. Mild obstruction on previous PFT. Abnormal CT with peribronchovascular cystic disease and some patchy upper lobe ground glass, stable November 2023 and improved January 2024. She is on Advair. Rarely uses albuterol unless she is sick. Intermittent reflux on PPI bid a few times a week. Remains on singualir and  zyrtec; still has congestion but does not like nasal sprays. Trial change to [employer]. Question aspiration with scarring and btx style phenotype on imaging. Stable nodules - repeat January 2025.   08/01/2022: OV with Taylor Madewell NP for intended follow up but she is also having some acute symptoms. She has been dealing with a productive cough for the [name] week or two. Started shortly  after her [name] visit. She feels like her symptoms have gotten worse over the [name] two days. Increased shortness of breath, chest congestion, wheezing, and fatigue. Sputum is yellow to green. Doesn't have any increased nasal congestion or drainage. She's also having some right lower flank pain, urinary frequency and dysuria. She denies fevers, chills, nausea, vomiting, hemoptysis, hematuria. She hasn't noticed a huge difference with the Breztri compared to the Advair. She did think about using her albuterol yesterday but ended up not needing it.   08/15/2022: Today - follow up Patient presents today for follow up after being treated for AECOPD with prednisone and possible UTI with bactrim. CXR without evidence of pneumonia. She is feeling better today. Breathing is back to her baseline. Her cough has resolved. Hasn't noticed much wheezing. Energy levels are returning. Urinary symptoms have also resolved. She did have glucose in her urine. Plans to follow up with her PCP about this. Prefers to return to her Advair and needs new rx. No other concerns or complaints.   Allergies  Allergen Reactions   Avelox [Moxifloxacin Hcl In Nacl] Hives   Linezolid Rash   Venlafaxine Nausea Only and Rash   Rosuvastatin Other (See Comments)    myalgias   Cephalexin Itching and Rash   Clindamycin Diarrhea   Nitrofurantoin Itching and Rash   Other Rash    Brewer yeast and Bakers yeast causes cracks in mouth stomach bloating    Penicillins Itching and Rash    Has patient had a PCN reaction causing anaphylaxis, immediate rash, facial/tongue/throat swelling, SOB or lightheadedness with hypotension? no Has patient had a PCN reaction causing severe rash involving mucus membranes or skin necrosis?  no Has patient had a PCN reaction that required hospitilization?no  Has patient had a PCN reaction occurring within the [name] 10 years?  no If all of the above answers are "no" then may proceed with cephalosporin use.   Statins  Other (See Comments)    Muscle pain    Immunization History  Administered Date(s) Administered   Covid-19, Mrna,Vaccine(Spikevax)48yr and older 03/29/2022   DTP 06/26/2005   Fluad Quad(high Dose 65+) 04/09/2022   Hepatitis A 06/20/2014, 12/19/2014   Hepatitis A, Ped/Adol-2 Dose 06/20/2014, 12/19/2014   Influenza, High Dose Seasonal PF 02/21/2018   Influenza,inj,Quad PF,6+ Mos 01/22/2018   Influenza,inj,quad, With Preservative 01/08/2019, 04/01/2019   Influenza-Unspecified 03/05/2012, 03/09/2013, 04/18/2013, 06/07/2014, 07/05/2015, 03/21/2016, 01/22/2018, 01/08/2019, 04/01/2019   PFIZER Comirnaty(Gray Top)Covid-19 Tri-Sucrose Vaccine 07/18/2019, 08/12/2019, 09/28/2020, 03/02/2021   PFIZER(Purple Top)SARS-COV-2 Vaccination 07/18/2019, 08/12/2019   Pneumococcal Conjugate-13 05/11/2018, 05/14/2021   Pneumococcal Polysaccharide-23 06/20/2014   Tdap 01/19/2012   Unspecified SARS-COV-2 Vaccination 07/18/2019, 08/12/2019   Zoster Recombinat (Shingrix) 09/23/2017, 01/22/2018   Zoster, Unspecified 09/23/2017, 01/22/2018    Past Medical History:  Diagnosis Date   Anxiety    Arthritis    "hands, neck, back" (07/22/2013)   Atrial fibrillation (HCC)    CHEST DISCOMFORT    Chronic back pain    "started in lower back; getting to be all over my back" (07/22/2013)   [place]    "I've had it 2 years in  a row; do have some trouble breathing at times; use an inhaler prn; not asthma" (07/22/2013)   Chronic neck pain    COPD (chronic obstructive pulmonary disease) (HCC)    DDD (degenerative disc disease), cervical    DDD (degenerative disc disease), lumbar    Depression    Dysrhythmia    GERD (gastroesophageal reflux disease)    Hepatitis 1971   "don't know if it was A or B" (07/22/2013)   HYPERLIPIDEMIA-MIXED 2005   "went away after I started taking fish oil" (07/22/2013)   Hypothyroidism    Palpitations    Pneumonia 2013   Type II diabetes mellitus ([place])    "diet controlled"  (07/22/2013)    Tobacco History: Social History   Tobacco Use  Smoking Status Former   Packs/day: 1.00   Years: 40.00   Total pack years: 40.00   Types: Cigarettes   Quit date: 12/22/2007   Years since quitting: 14.6  Smokeless Tobacco Never   Counseling given: Not Answered   Outpatient Medications Prior to Visit  Medication Sig Dispense Refill   acetaminophen (TYLENOL) 500 MG tablet Take 1,000 mg by mouth every 6 (six) hours as needed for moderate pain or headache.      albuterol (PROVENTIL) (2.5 MG/3ML) 0.083% nebulizer solution Take 3 mLs (2.5 mg total) by nebulization every 4 (four) hours as needed for wheezing or shortness of breath. 75 mL 2   albuterol (VENTOLIN HFA) 108 (90 Base) MCG/ACT inhaler Inhale 1 puff into the lungs every 6 (six) hours as needed for wheezing or shortness of breath. 1 each 1   Ascorbic Acid (VITAMIN C) 1000 MG tablet Take 1,000 mg by mouth daily.     buPROPion (WELLBUTRIN XL) 150 MG 24 hr tablet Take 150 mg by mouth 2 (two) times daily.     cetirizine (ZYRTEC) 10 MG tablet Take 10 mg by mouth daily.      Cinnamon 500 MG TABS Take 1,000 mg by mouth daily.     ESTRADIOL TD Apply 1 mL topically daily. Estradiol 7.'5mg'$ /gm cream ( apply to thighs)     ezetimibe (ZETIA) 10 MG tablet Take 1 tablet (10 mg total) by mouth daily. 90 tablet 3   FLUoxetine (PROZAC) 20 MG tablet Take 40 mg by mouth daily.     furosemide (LASIX) 80 MG tablet Take 80 mg by mouth daily.     Guaifenesin 1200 MG TB12 Take 1,200 mg by mouth daily.     hydrOXYzine (ATARAX/VISTARIL) 50 MG tablet Take 50 mg by mouth daily as needed for itching.      ketoconazole (NIZORAL) 2 % cream Apply 1 application topically daily.      metoprolol tartrate (LOPRESSOR) 25 MG tablet Take 1 tablet (25 mg total) by mouth 2 (two) times daily. 180 tablet 2   montelukast (SINGULAIR) 10 MG tablet Take 10 mg by mouth at bedtime.     OIL OF OREGANO PO Take 1-2 capsules by mouth daily as needed (cold symptoms).       OVER THE COUNTER MEDICATION Take 2 tablets by mouth daily at 6 (six) AM. SACROMYCES bouliardi (probiotic)     Oxycodone HCl 10 MG TABS Take 10 mg by mouth 5 (five) times daily as needed (pain).     pantoprazole (PROTONIX) 40 MG tablet Take 1 tablet (40 mg total) by mouth 2 (two) times daily. 60 tablet 5   potassium chloride (KLOR-CON M10) 10 MEQ tablet Take 1 tablet (10 mEq total) by mouth daily. 90 tablet  3   pregabalin (LYRICA) 100 MG capsule Take 100 mg by mouth 3 (three) times daily.     progesterone (PROMETRIUM) 100 MG capsule Take 300 mg by mouth every evening.      pseudoephedrine (SUDAFED) 120 MG 12 hr tablet Take 120 mg by mouth daily.     TESTOSTERONE TD Place 1-2 application onto the skin See admin instructions. Testosterone 2% cream. Apply 1 click's worth of testosterone cream on Sun, Tues, Thurs, and Sat. Apply 2 click's worth on Mon, Wed, and Fri     thyroid (ARMOUR) 60 MG tablet Take 30-120 mg by mouth See admin instructions. Take 120 mg in the morning and 30 mg in the afternoon     tiZANidine (ZANAFLEX) 4 MG tablet Take 2-4 mg by mouth 4 (four) times daily as needed for muscle spasms. 2 mg 3 times daily - 4 mg at bedtime     TURMERIC PO Take 1,440 mg by mouth every evening.     Vitamin D-Vitamin K (VITAMIN K2-VITAMIN D3 PO) Take 1 capsule by mouth every evening.     Fluticasone-Salmeterol (ADVAIR) 500-50 MCG/DOSE AEPB Inhale 1 puff into the lungs in the morning and at bedtime.      Budeson-Glycopyrrol-Formoterol (BREZTRI AEROSPHERE) 160-9-4.8 MCG/ACT AERO Inhale 2 puffs into the lungs in the morning and at bedtime. 5.9 g 0   No facility-administered medications prior to visit.     Review of Systems:   Constitutional: No weight loss or gain, night sweats, fevers, chills, fatigue, lassitude  HEENT: No headaches, difficulty swallowing, tooth/dental problems, or sore throat. No sneezing, itching, ear ache. +nasal congestion, post nasal drip (baseline) CV:  No chest pain,  orthopnea, PND, swelling in lower extremities, anasarca, dizziness, palpitations, syncope Resp: +shortness of breath with exertion (baseline). No cough. No wheezing. No hemoptysis. No chest wall deformity GI:  No heartburn, indigestion, abdominal pain, nausea, vomiting, loss of appetite GU: +dysuria, frequency, flank pain. No hematuria.   Skin: No rash, lesions, ulcerations MSK:  No joint pain or swelling.   Neuro: No dizziness or lightheadedness.  Psych: No depression or anxiety. Mood stable.     Physical Exam:  BP 120/60 (BP Location: Right Arm, Patient Position: Sitting, Cuff Size: Normal)   Pulse 64   Temp 98.1 F (36.7 C) (Oral)   Ht '5\' 6"'$  (1.676 [name])   Wt 212 lb 9.6 oz (96.4 kg)   SpO2 94%   BMI 34.31 kg/[name]   GEN: Pleasant, interactive, well-kempt; obese; in no acute distress HEENT:  Normocephalic and atraumatic. PERRLA. Sclera white. Nasal turbinates pink, moist and patent bilaterally. No rhinorrhea present. Oropharynx pink and moist, without exudate or edema. No lesions, ulcerations, or postnasal drip.  NECK:  Supple w/ fair ROM. No JVD present. Normal carotid impulses w/o bruits. Thyroid symmetrical with no goiter or nodules palpated. No lymphadenopathy.   CV: RRR, no [name]/r/g, no peripheral edema. Pulses intact, +2 bilaterally. No cyanosis, pallor or clubbing. PULMONARY:  Unlabored, regular breathing. Clear bilaterally A&P w/o wheezes/rales/rhonchi. No accessory muscle use.  GI: BS present and normoactive. Soft, non-tender to palpation. No organomegaly or masses detected. No CVA tenderness MSK: No erythema, warmth or tenderness. Cap refil <2 sec all extrem. No deformities or joint swelling noted.  Neuro: A/Ox3. No focal deficits noted.   Skin: Warm, no lesions or rashe Psych: Normal affect and behavior. Judgement and thought content appropriate.     Lab Results:  CBC    Component Value Date/Time   WBC 8.2 06/06/2022 1600  RBC 4.61 06/06/2022 1600   HGB 14.0 06/06/2022  1600   HGB 14.0 11/08/2018 1537   HCT 41.5 06/06/2022 1600   HCT 41.0 11/08/2018 1537   PLT 232 06/06/2022 1600   PLT 306 11/08/2018 1537   MCV 90.0 06/06/2022 1600   MCV 86 11/08/2018 1537   MCH 30.4 06/06/2022 1600   MCHC 33.7 06/06/2022 1600   RDW 12.0 06/06/2022 1600   RDW 13.1 11/08/2018 1537   LYMPHSABS 2,444 06/06/2022 1600   MONOABS 0.7 05/12/2022 0333   EOSABS 705 (H) 06/06/2022 1600   BASOSABS 74 06/06/2022 1600    BMET    Component Value Date/Time   NA 141 06/30/2022 1143   K 4.6 06/30/2022 1143   CL 98 06/30/2022 1143   CO2 26 06/30/2022 1143   GLUCOSE 81 06/30/2022 1143   GLUCOSE 106 (H) 05/14/2022 0403   BUN 22 06/30/2022 1143   CREATININE 1.05 (H) 06/30/2022 1143   CALCIUM 9.3 06/30/2022 1143   GFRNONAA >60 05/14/2022 0403   GFRAA 60 (L) 09/21/2019 0708    BNP    Component Value Date/Time   BNP 292.9 (H) 05/10/2022 1307     Imaging:  DG Chest 2 View  Result Date: 08/01/2022 CLINICAL DATA:  Productive cough EXAM: CHEST - 2 VIEW COMPARISON:  CT chest 07/07/2022 FINDINGS: Enlargement of cardiac silhouette. Mediastinal contours and pulmonary vascularity normal. Bronchitic changes with accentuation of interstitial markings bilaterally greater in the perihilar regions of the upper lobes, similar to prior CT. No definite acute infiltrate, pleural effusion, or pneumothorax. Prior cervical spine fusion. IMPRESSION: Chronic bronchitic and interstitial changes. No definite acute abnormalities. Electronically Signed   By: Lavonia Dana [name].D.   On: 08/01/2022 15:23    iohexol (OMNIPAQUE) 300 MG/ML solution 100 mL     Date Action Dose Route User   07/07/2022 1321 Contrast Given 75 mL Intravenous Delbert Phenix D          Latest Ref Rng & Units 04/23/2022    2:58 PM  PFT Results  FVC-Pre L 3.19   FVC-Predicted Pre % 96   FVC-Post L 3.27   FVC-Predicted Post % 98   Pre FEV1/FVC % % 73   Post FEV1/FCV % % 71   FEV1-Pre L 2.32   FEV1-Predicted Pre % 92    FEV1-Post L 2.31   DLCO uncorrected ml/min/mmHg 15.61   DLCO UNC% % 74   DLCO corrected ml/min/mmHg 15.61   DLCO COR %Predicted % 74   DLVA Predicted % 85   TLC L 5.05   TLC % Predicted % 94   RV % Predicted % 77     No results found for: "NITRICOXIDE"      Assessment & Plan:   COPD with chronic bronchitis COPD with frequent bronchitis. Clinically improved. Question asthmatic component as she has peripheral eosinophilia and very steroid responsive. She may be a good candidate for Dupixent if she has any recurrent flares. Will discuss with Dr. Lamonte [name]. Possible poorly controlled GERD is exacerbating. She is already on twice daily PPI; referral was sent to GI at her previous appt. Aspiration/GERD precautions reviewed. We will change her back to Advair and add on Spiriva to accomplish triple therapy regimen. Action plan in place.   Patient Instructions  Use flutter valve three times a day  Continue mucinex '600mg'$  twice a day for chest congestion/cough Ok to restart Advair 556mg- one puffs morning and evening Add Spiriva 2 puffs daily Continue Singulair '10mg'$  at bedtime  Use  Albuterol 1-2 puffs or 3 mL every 4-6 hours for breakthrough. Use neb treatments at least twice daily until symptoms improve  Continue protonix 1 tab Twice daily for reflux   Sleep with head of bed elevated. Avoid eating 2-3 hours before bedtime. Attend appointment with GI once scheduled   Follow up with your PCP regarding sugar in your urine. They will likely need to obtain further testing since you have been prediabetic in the past.   Follow up in 6 weeks with Dr. Lamonte [name] or Alanson Aly to see how Stann Ore is working. If symptoms worsen, please contact office for sooner follow up or seek emergency care.    Recurrent pneumonia Hospitalized 05/2022 for multifocal pneumonia. Resolved on recent imaging. Question aspiration with scarring and btx style on imaging. See above plan.  GERD (gastroesophageal reflux  disease) Continue twice daily PPI. Attend GI referral.   Allergic rhinitis Stable on current regimen.   I spent 35 minutes of dedicated to the care of this patient on the date of this encounter to include pre-visit review of records, face-to-face time with the patient discussing conditions above, post visit ordering of testing, clinical documentation with the electronic health record, making appropriate referrals as documented, and communicating necessary findings to members of the patients care team.  Clayton Bibles, NP 08/15/2022  Pt aware and understands NP's role.

## 2022-08-15 NOTE — Assessment & Plan Note (Signed)
Hospitalized 05/2022 for multifocal pneumonia. Resolved on recent imaging. Question aspiration with scarring and btx style on imaging. See above plan.

## 2022-08-15 NOTE — Assessment & Plan Note (Signed)
COPD with frequent bronchitis. Clinically improved. Question asthmatic component as she has peripheral eosinophilia and very steroid responsive. She may be a good candidate for Dupixent if she has any recurrent flares. Will discuss with Dr. Lamonte Sakai. Possible poorly controlled GERD is exacerbating. She is already on twice daily PPI; referral was sent to GI at her previous appt. Aspiration/GERD precautions reviewed. We will change her back to Advair and add on Spiriva to accomplish triple therapy regimen. Action plan in place.   Patient Instructions  Use flutter valve three times a day  Continue mucinex '600mg'$  twice a day for chest congestion/cough Ok to restart Advair 551mg- one puffs morning and evening Add Spiriva 2 puffs daily Continue Singulair '10mg'$  at bedtime  Use Albuterol 1-2 puffs or 3 mL every 4-6 hours for breakthrough. Use neb treatments at least twice daily until symptoms improve  Continue protonix 1 tab Twice daily for reflux   Sleep with head of bed elevated. Avoid eating 2-3 hours before bedtime. Attend appointment with GI once scheduled   Follow up with your PCP regarding sugar in your urine. They will likely need to obtain further testing since you have been prediabetic in the past.   Follow up in 6 weeks with Dr. BLamonte Sakaior KAlanson Alyto see how SStann Oreis working. If symptoms worsen, please contact office for sooner follow up or seek emergency care.

## 2022-08-15 NOTE — Progress Notes (Signed)
Agree with Plans

## 2022-08-15 NOTE — Assessment & Plan Note (Signed)
Stable on current regimen   

## 2022-08-18 ENCOUNTER — Other Ambulatory Visit: Payer: Self-pay | Admitting: Internal Medicine

## 2022-08-18 DIAGNOSIS — R609 Edema, unspecified: Secondary | ICD-10-CM

## 2022-08-20 ENCOUNTER — Ambulatory Visit (INDEPENDENT_AMBULATORY_CARE_PROVIDER_SITE_OTHER): Payer: PPO

## 2022-08-20 DIAGNOSIS — Z1231 Encounter for screening mammogram for malignant neoplasm of breast: Secondary | ICD-10-CM

## 2022-08-22 ENCOUNTER — Other Ambulatory Visit: Payer: Self-pay | Admitting: Internal Medicine

## 2022-08-25 ENCOUNTER — Encounter: Payer: Self-pay | Admitting: Internal Medicine

## 2022-09-01 NOTE — Telephone Encounter (Signed)
Good to hear she is feeling better    Not concerned about sl foot swelling

## 2022-09-02 NOTE — Progress Notes (Unsigned)
Cardiology Office Note   Date:  09/04/2022   ID:  Taylor [name], Taylor [name] 1950/08/29, MRN [id number]  PCP:  Taylor [name], Taylor M, NP  Cardiologist:   Taylor Carnes, Taylor [name]   F/U of PAF    History of Present Illness: Taylor [name] is a 72 y.o. female with a history of OSA, COPD, DM hypothyroidism, GERD and PA Patient had afib initially in 2009, around time of URI.  Had intermitt after. Placed on flecanide 100 bid   In 2021 she had recurrent palpitations / afib and was seen by Taylor [name].  She underwent ablation of afib in early 2021 (CT prior showed Ca score of 1)     After this her flecanide was stopped   With no recurrence of afib, Xarelto was also disconfinued   I saw the pt in Dec 2023  She had been admitted for rep failure  with multfocal pneumonia   Was in afbi on afmit with RVR  Converted to SR during admit     After I saw her pt wore monitor in Jan 2024  This  showed SR   No  Afib     Since seen she says her breathing is OK   She Denies CP   Notes slight  LE edema    No palpitations   Current Meds  Medication Sig   acetaminophen (TYLENOL) 500 MG tablet Take 1,000 mg by mouth every 6 (six) hours as needed for moderate pain or headache.    albuterol (PROVENTIL) (2.5 MG/3ML) 0.083% nebulizer solution Take 3 mLs (2.5 mg total) by nebulization every 4 (four) hours as needed for wheezing or shortness of breath.   albuterol (VENTOLIN HFA) 108 (90 Base) MCG/ACT inhaler Inhale 1 puff into the lungs every 6 (six) hours as needed for wheezing or shortness of breath.   Alpha-Lipoic Acid 100 MG CAPS Take 1 capsule by mouth daily.   Ascorbic Acid (VITAMIN C) 1000 MG tablet Take 1,000 mg by mouth daily.   aspirin EC 81 MG tablet Take 81 mg by mouth daily.   buPROPion (WELLBUTRIN XL) 150 MG 24 hr tablet Take 150 mg by mouth 2 (two) times daily.   cetirizine (ZYRTEC) 10 MG tablet Take 10 mg by mouth daily.    Cholecalciferol 50 MCG (2000 UT) CAPS Take 1 capsule by mouth daily.   Cinnamon 500 MG TABS Take  1,000 mg by mouth daily.   ESTRADIOL TD Apply 1 mL topically daily. Estradiol 7.5mg /gm cream ( apply to thighs)   ezetimibe (ZETIA) 10 MG tablet Take 1 tablet (10 mg total) by mouth daily.   FLUoxetine (PROZAC) 20 MG tablet Take 40 mg by mouth daily.   fluticasone-salmeterol (ADVAIR) 500-50 MCG/ACT AEPB Inhale 1 puff into the lungs in the morning and at bedtime.   furosemide (LASIX) 80 MG tablet Take 1 tablet (80 mg total) by mouth daily.   Guaifenesin 1200 MG TB12 Take 1,200 mg by mouth daily.   hydrOXYzine (ATARAX/VISTARIL) 50 MG tablet Take 50 mg by mouth daily as needed for itching.    JARDIANCE 10 MG TABS tablet Take 1 tablet by mouth daily before breakfast.   ketoconazole (NIZORAL) 2 % cream Apply 1 application topically daily.    methocarbamol (ROBAXIN) 500 MG tablet Take 500 mg by mouth every 6 (six) hours as needed for muscle spasms.   metoprolol tartrate (LOPRESSOR) 25 MG tablet Take 1 tablet (25 mg total) by mouth 2 (two) times daily.   montelukast (SINGULAIR) 10 MG  tablet Take 10 mg by mouth at bedtime.   OIL OF OREGANO PO Take 1-2 capsules by mouth daily as needed (cold symptoms).    OVER THE COUNTER MEDICATION Take 2 tablets by mouth daily at 6 (six) AM. SACROMYCES bouliardi (probiotic)   Oxycodone HCl 10 MG TABS Take 10 mg by mouth 5 (five) times daily as needed (pain).   pantoprazole (PROTONIX) 40 MG tablet Take 1 tablet (40 mg total) by mouth 2 (two) times daily.   potassium chloride (KLOR-CON M10) 10 MEQ tablet Take 1 tablet (10 mEq total) by mouth daily.   pregabalin (LYRICA) 100 MG capsule Take 100 mg by mouth 3 (three) times daily.   progesterone (PROMETRIUM) 100 MG capsule Take 300 mg by mouth every evening.    progesterone (PROMETRIUM) 100 MG capsule Take 100 mg by mouth at bedtime.   pseudoephedrine (SUDAFED) 120 MG 12 hr tablet Take 120 mg by mouth daily.   TESTOSTERONE TD Place 1-2 application onto the skin See admin instructions. Testosterone 2% cream. Apply 1 click's  worth of testosterone cream on Sun, Tues, Thurs, and Sat. Apply 2 click's worth on Mon, Wed, and Fri   thyroid (ARMOUR) 60 MG tablet Take 30-120 mg by mouth See admin instructions. Take 120 mg in the morning and 30 mg in the afternoon   Tiotropium Bromide Monohydrate (SPIRIVA RESPIMAT) 2.5 MCG/ACT AERS Inhale 2 puffs into the lungs daily.   Tiotropium Bromide Monohydrate (SPIRIVA RESPIMAT) 2.5 MCG/ACT AERS Inhale 2 each into the lungs daily.   tiZANidine (ZANAFLEX) 4 MG tablet Take 2-4 mg by mouth 4 (four) times daily as needed for muscle spasms. 2 mg 3 times daily - 4 mg at bedtime   TURMERIC PO Take 1,440 mg by mouth every evening.   Vitamin D-Vitamin K (VITAMIN K2-VITAMIN D3 PO) Take 1 capsule by mouth every evening.   [DISCONTINUED] Tiotropium Bromide Monohydrate (SPIRIVA RESPIMAT) 2.5 MCG/ACT AERS Inhale 2 puffs into the lungs daily.     Allergies:   Avelox [moxifloxacin hcl in nacl], Linezolid, Venlafaxine, Rosuvastatin, Cephalexin, Clindamycin, Nitrofurantoin, Other, Penicillins, and Statins   Past Medical History:  Diagnosis Date   Anxiety    Arthritis    "hands, neck, back" (07/22/2013)   Atrial fibrillation ([place])    CHEST DISCOMFORT    Chronic back pain    "started in lower back; getting to be all over my back" (07/22/2013)   Chronic bronchitis ([place])    "I've had it 2 years in a row; do have some trouble breathing at times; use an inhaler prn; not asthma" (07/22/2013)   Chronic neck pain    COPD (chronic obstructive pulmonary disease) (HCC)    DDD (degenerative disc disease), cervical    DDD (degenerative disc disease), lumbar    Depression    Dysrhythmia    GERD (gastroesophageal reflux disease)    Hepatitis 1971   "don't know if it was A or B" (07/22/2013)   HYPERLIPIDEMIA-MIXED 2005   "went away after I started taking fish oil" (07/22/2013)   Hypothyroidism    Palpitations    Pneumonia 2013   Type II diabetes mellitus ([place])    "diet controlled" (07/22/2013)    Past  Surgical History:  Procedure Laterality Date   ANTERIOR CERVICAL DECOMP/DISCECTOMY FUSION  ?2003   "w/plating" (07/22/2013)   ATRIAL FIBRILLATION ABLATION N/A 09/21/2019   Procedure: ATRIAL FIBRILLATION ABLATION;  Surgeon: Constance Haw, Taylor [name];  Location: [facility];  Service: Cardiovascular;  Laterality: N/A;   BUNIONECTOMY Left  2013   DILATION AND CURETTAGE OF UTERUS  1976   PLANTAR FASCIA SURGERY Right 2011     Social History:  The patient  reports that she quit smoking about 14 years ago. Her smoking use included cigarettes. She has a 40.00 pack-year smoking history. She has never used smokeless tobacco. She reports that she does not currently use alcohol. She reports that she does not use drugs.   Family History:  The patient's family history includes Diabetes in her maternal grandfather; Heart attack in her paternal grandfather; Heart disease in an other family member.    ROS:  Please see the history of present illness. All other systems are reviewed and  Negative to the above problem except as noted.    PHYSICAL EXAM: VS:  BP 128/60   Pulse 64   Ht 5\' 6"  (1.676 [name])   Wt 214 lb 12.8 oz (97.4 kg)   SpO2 93%   BMI 34.67 kg/[name]   Gen  Obese 72 yo in no acute distress  HEENT: normal  Neck: JVP normal , No carotid bruit Cardiac: RRR  No murmurs  No LE edema  Respiratory:  clear to auscultation SOme decreased flow    GI: soft, nontender, nondistended, + BS  No hepatomegaly  MS: no deformity Moving all extremities    EKG:  EKG is not ordered today  Lipid Panel    Component Value Date/Time   CHOL 192 05/07/2022 1217   TRIG 75 05/07/2022 1217   HDL 59 05/07/2022 1217   CHOLHDL 3.3 05/07/2022 1217   CHOLHDL 4.2 CALC 11/17/2007 0844   VLDL 18 11/17/2007 0844   LDLCALC 119 (H) 05/07/2022 1217      Wt Readings from Last 3 Encounters:  09/04/22 214 lb 12.8 oz (97.4 kg)  08/15/22 212 lb 9.6 oz (96.4 kg)  08/01/22 216 lb 3.2 oz (98.1 kg)      ASSESSMENT AND  PLAN:  1  PAF Pt is s/p ablation  She is off of antiarrhythmics and antioagualation Had afib with pneumonnia Converted to SR on own   She wore a monitor in Jan 2024 showed SR      2  HFpEF   PT with normal LVEF   Gr II disatolic dysfunciton     had exacerbation of volume overload withCOPD flare this winter when went back in afb. Volume overall appears OK  Will check BMET and BNP    Will set up for echo to reevaluate systolic /diastolic function, strain analysis  (Pt asks about ATTR) Rapid afib and pnemonia are enough to explain recent exacerbation.    3  PAD  Pt with mild plaquing on CT   Control risk factors   4   HL  REcheck lipomed today    5   HTN  BP is adequately controlled     Current medicines are reviewed at length with the patient today.  The patient does not have concerns regarding medicines.  Signed, Taylor Carnes, Taylor [name]  09/04/2022 3:09 PM    [facility] [place], [place], [place]  [address] Phone: [phone]; Fax: [phone]

## 2022-09-04 ENCOUNTER — Encounter: Payer: Self-pay | Admitting: Internal Medicine

## 2022-09-04 ENCOUNTER — Ambulatory Visit: Payer: PPO | Attending: Internal Medicine | Admitting: Internal Medicine

## 2022-09-04 VITALS — BP 128/60 | HR 64 | Ht 66.0 in | Wt 214.8 lb

## 2022-09-04 DIAGNOSIS — I480 Paroxysmal atrial fibrillation: Secondary | ICD-10-CM

## 2022-09-04 DIAGNOSIS — I5040 Unspecified combined systolic (congestive) and diastolic (congestive) heart failure: Secondary | ICD-10-CM | POA: Diagnosis not present

## 2022-09-04 NOTE — Patient Instructions (Signed)
Medication Instructions:  Your physician recommends that you continue on your current medications as directed. Please refer to the Current Medication list given to you today.  *If you need a refill on your cardiac medications before your next appointment, please call your pharmacy*   Lab Work: BMET,  BNP, Lipomed NMR If you have labs (blood work) drawn today and your tests are completely normal, you will receive your results only by: [place] (if you have MyChart) OR A paper copy in the mail If you have any lab test that is abnormal or we need to change your treatment, we will call you to review the results.   Testing/Procedures: Your physician has requested that you have an echocardiogram. Echocardiography is a painless test that uses sound waves to create images of your heart. It provides your doctor with information about the size and shape of your heart and how well your heart's chambers and valves are working. This procedure takes approximately one hour. There are no restrictions for this procedure. Please do NOT wear cologne, perfume, aftershave, or lotions (deodorant is allowed). Please arrive 15 minutes prior to your appointment time.    Follow-Up: At [facility], you and your health needs are our priority.  As part of our continuing mission to provide you with exceptional heart care, we have created designated Provider Care Teams.  These Care Teams include your primary Cardiologist (physician) and Advanced Practice Providers (APPs -  Physician Assistants and Nurse Practitioners) who all work together to provide you with the care you need, when you need it.  We recommend signing up for the patient portal called "MyChart".  Sign up information is provided on this After Visit Summary.  MyChart is used to connect with patients for Virtual Visits (Telemedicine).  Patients are able to view lab/test results, encounter notes, upcoming appointments, etc.  Non-urgent messages  can be sent to your provider as well.   To learn more about what you can do with MyChart, go to [email or web address].    Your next appointment:   7 month(s)  Provider:   Dorris Carnes, MD

## 2022-09-06 LAB — BASIC METABOLIC PANEL
BUN/Creatinine Ratio: 21 (ref 12–28)
BUN: 27 mg/dL (ref 8–27)
CO2: 22 mmol/L (ref 20–29)
Calcium: 9.4 mg/dL (ref 8.7–10.3)
Chloride: 100 mmol/L (ref 96–106)
Creatinine, Ser: 1.31 mg/dL — ABNORMAL HIGH (ref 0.57–1.00)
Glucose: 75 mg/dL (ref 70–99)
Potassium: 4.9 mmol/L (ref 3.5–5.2)
Sodium: 139 mmol/L (ref 134–144)
eGFR: 44 mL/min/{1.73_m2} — ABNORMAL LOW (ref >59)

## 2022-09-06 LAB — NMR, LIPOPROFILE
Cholesterol, Total: 202 mg/dL — ABNORMAL HIGH (ref 100–199)
HDL Particle Number: 32.1 umol/L (ref >=30.5)
HDL-C: 63 mg/dL (ref >39)
LDL Particle Number: 1373 nmol/L — ABNORMAL HIGH (ref <1000)
LDL Size: 21.4 nm (ref >20.5)
LDL-C (NIH Calc): 116 mg/dL — ABNORMAL HIGH (ref 0–99)
LP-IR Score: <25 (ref <=45)
Small LDL Particle Number: 541 nmol/L — ABNORMAL HIGH (ref <=527)
Triglycerides: 133 mg/dL (ref 0–149)

## 2022-09-06 LAB — PRO B NATRIURETIC PEPTIDE: NT-Pro BNP: 511 pg/mL — ABNORMAL HIGH (ref 0–301)

## 2022-09-09 ENCOUNTER — Other Ambulatory Visit: Payer: Self-pay

## 2022-09-09 DIAGNOSIS — Z79899 Other long term (current) drug therapy: Secondary | ICD-10-CM

## 2022-09-09 DIAGNOSIS — E782 Mixed hyperlipidemia: Secondary | ICD-10-CM

## 2022-09-11 ENCOUNTER — Other Ambulatory Visit: Payer: Self-pay | Admitting: Emergency Medicine

## 2022-09-12 ENCOUNTER — Ambulatory Visit: Payer: PPO | Attending: Cardiovascular Disease | Admitting: Pharmacist

## 2022-09-12 DIAGNOSIS — E785 Hyperlipidemia, unspecified: Secondary | ICD-10-CM | POA: Diagnosis not present

## 2022-09-12 DIAGNOSIS — R7303 Prediabetes: Secondary | ICD-10-CM | POA: Diagnosis not present

## 2022-09-12 DIAGNOSIS — E782 Mixed hyperlipidemia: Secondary | ICD-10-CM

## 2022-09-12 DIAGNOSIS — Z79899 Other long term (current) drug therapy: Secondary | ICD-10-CM | POA: Diagnosis not present

## 2022-09-12 MED ORDER — PRAVASTATIN SODIUM 40 MG PO TABS: 40.0000 mg | ORAL_TABLET | Freq: Every evening | ORAL | 3 refills | Status: DC

## 2022-09-12 NOTE — Progress Notes (Signed)
Patient ID: Taylor [name]                 DOB: [birth year]                    MRN: [id number]      HPI: Taylor [name] is a 72 y.o. female patient referred to lipid clinic by Dr. Tenny Craw. PMH is significant for OSA, COPD, DM, hypothyroidism, GERD Aortic Atherosclerosis on CT and PAF.   Today, patient presents for optimization of LLT given her lipid panel on 09/04/2022 showed an elevated LDL (116) on ezetimibe 10 mg daily. She recalls using lovastatin and rosuvastatin in the past. She notes that she felt persistent muscle pain with lovastatin (which improved when she stopped it), but does not remember if she experienced this with rosuvastatin before she stopped taking it. She seems receptive to retrial of a statin, but prefers to take a statin that she has not tried in the past.  Patient states that she lost 50 lbs in 2023 with [employer]. Feels she gained weight after she quit smoking. She was hospitalized twice thereafter due to pneumonia and SOB, and subsequently "gave up" on weight loss. Patient notes that she would be receptive to making dietary modifications and increasing her physical activity.   Reviewed options for lowering LDL cholesterol, including statins and ezetimibe. Discussed mechanisms of action, dosing, side effects and potential decreases in LDL cholesterol.  Also reviewed cost information and potential options for patient assistance.  Current Medications: ezetimibe 10 mg daily Intolerances: lovastatin 20 mg, rosuvastatin 10 mg Risk Factors: DM, CKD, aortic athrosclerosis LDL-C goal: <70 mg/dL ApoB goal: <56 mg/dL  Diet: Patient states that her diet is poor. She describes persistently giving in to "cravings" for sugary and high-fat foods.  Exercise: Patient states that she is limited with regards to exercise due to SOB. Gets most of her physical activity through gardening.  Family History: The patient's family history includes Diabetes in her maternal grandfather; Heart  attack in her paternal grandfather; Heart disease in an other family member.   Social History: Former smoker (40-pack years, quit 15 years ago), no smokeless tobacco, alcohol, or illicit drug use.  Labs: Lipid Panel  09/03/21 LDL-P 1373, LDL-C 116, HDL 63 (zetia 10mg  daily)     Component Value Date/Time   CHOL 192 05/07/2022 1217   TRIG 75 05/07/2022 1217   HDL 59 05/07/2022 1217   CHOLHDL 3.3 05/07/2022 1217   CHOLHDL 4.2 CALC 11/17/2007 0844   VLDL 18 11/17/2007 0844   LDLCALC 119 (H) 05/07/2022 1217   LABVLDL 14 05/07/2022 1217    Past Medical History:  Diagnosis Date   Anxiety    Arthritis    "hands, neck, back" (07/22/2013)   Atrial fibrillation (HCC)    CHEST DISCOMFORT    Chronic back pain    "started in lower back; getting to be all over my back" (07/22/2013)   Chronic bronchitis (HCC)    "I've had it 2 years in a row; do have some trouble breathing at times; use an inhaler prn; not asthma" (07/22/2013)   Chronic neck pain    COPD (chronic obstructive pulmonary disease) (HCC)    DDD (degenerative disc disease), cervical    DDD (degenerative disc disease), lumbar    Depression    Dysrhythmia    GERD (gastroesophageal reflux disease)    Hepatitis 1971   "don't know if it was A or B" (07/22/2013)   HYPERLIPIDEMIA-MIXED 2005   "  went away after I started taking fish oil" (07/22/2013)   Hypothyroidism    Palpitations    Pneumonia 2013   Type II diabetes mellitus (HCC)    "diet controlled" (07/22/2013)    Current Outpatient Medications on File Prior to Visit  Medication Sig Dispense Refill   acetaminophen (TYLENOL) 500 MG tablet Take 1,000 mg by mouth every 6 (six) hours as needed for moderate pain or headache.      albuterol (PROVENTIL) (2.5 MG/3ML) 0.083% nebulizer solution Take 3 mLs (2.5 mg total) by nebulization every 4 (four) hours as needed for wheezing or shortness of breath. 75 mL 2   albuterol (VENTOLIN HFA) 108 (90 Base) MCG/ACT inhaler Inhale 1 puff into the  lungs every 6 (six) hours as needed for wheezing or shortness of breath. 1 each 1   Alpha-Lipoic Acid 100 MG CAPS Take 1 capsule by mouth daily.     Ascorbic Acid (VITAMIN C) 1000 MG tablet Take 1,000 mg by mouth daily.     aspirin EC 81 MG tablet Take 81 mg by mouth daily.     buPROPion (WELLBUTRIN XL) 150 MG 24 hr tablet Take 150 mg by mouth 2 (two) times daily.     cetirizine (ZYRTEC) 10 MG tablet Take 10 mg by mouth daily.      Cholecalciferol 50 MCG (2000 UT) CAPS Take 1 capsule by mouth daily.     Cinnamon 500 MG TABS Take 1,000 mg by mouth daily.     ESTRADIOL TD Apply 1 mL topically daily. Estradiol 7.5mg /gm cream ( apply to thighs)     FLUoxetine (PROZAC) 20 MG tablet Take 40 mg by mouth daily.     fluticasone-salmeterol (ADVAIR) 500-50 MCG/ACT AEPB Inhale 1 puff into the lungs in the morning and at bedtime. 180 each 3   furosemide (LASIX) 80 MG tablet Take 1 tablet (80 mg total) by mouth daily. 90 tablet 2   Guaifenesin 1200 MG TB12 Take 1,200 mg by mouth daily.     hydrOXYzine (ATARAX/VISTARIL) 50 MG tablet Take 50 mg by mouth daily as needed for itching.      JARDIANCE 10 MG TABS tablet Take 1 tablet by mouth daily before breakfast.     ketoconazole (NIZORAL) 2 % cream Apply 1 application topically daily.      methocarbamol (ROBAXIN) 500 MG tablet Take 500 mg by mouth every 6 (six) hours as needed for muscle spasms.     metoprolol tartrate (LOPRESSOR) 25 MG tablet Take 1 tablet (25 mg total) by mouth 2 (two) times daily. 180 tablet 2   montelukast (SINGULAIR) 10 MG tablet Take 10 mg by mouth at bedtime.     OIL OF OREGANO PO Take 1-2 capsules by mouth daily as needed (cold symptoms).      OVER THE COUNTER MEDICATION Take 2 tablets by mouth daily at 6 (six) AM. SACROMYCES bouliardi (probiotic)     Oxycodone HCl 10 MG TABS Take 10 mg by mouth 5 (five) times daily as needed (pain).     pantoprazole (PROTONIX) 40 MG tablet Take 1 tablet (40 mg total) by mouth 2 (two) times daily. 60  tablet 5   potassium chloride (KLOR-CON M10) 10 MEQ tablet Take 1 tablet (10 mEq total) by mouth daily. 90 tablet 3   pregabalin (LYRICA) 100 MG capsule Take 100 mg by mouth 3 (three) times daily.     progesterone (PROMETRIUM) 100 MG capsule Take 300 mg by mouth every evening.      progesterone (PROMETRIUM)  100 MG capsule Take 100 mg by mouth at bedtime.     pseudoephedrine (SUDAFED) 120 MG 12 hr tablet Take 120 mg by mouth daily.     TESTOSTERONE TD Place 1-2 application onto the skin See admin instructions. Testosterone 2% cream. Apply 1 click's worth of testosterone cream on Sun, Tues, Thurs, and Sat. Apply 2 click's worth on Mon, Wed, and Fri     thyroid (ARMOUR) 60 MG tablet Take 30-120 mg by mouth See admin instructions. Take 120 mg in the morning and 30 mg in the afternoon     Tiotropium Bromide Monohydrate (SPIRIVA RESPIMAT) 2.5 MCG/ACT AERS Inhale 2 puffs into the lungs daily. 4 g 5   Tiotropium Bromide Monohydrate (SPIRIVA RESPIMAT) 2.5 MCG/ACT AERS Inhale 2 each into the lungs daily.     tiZANidine (ZANAFLEX) 4 MG tablet Take 2-4 mg by mouth 4 (four) times daily as needed for muscle spasms. 2 mg 3 times daily - 4 mg at bedtime     TURMERIC PO Take 1,440 mg by mouth every evening.     Vitamin D-Vitamin K (VITAMIN K2-VITAMIN D3 PO) Take 1 capsule by mouth every evening.     No current facility-administered medications on file prior to visit.    Allergies  Allergen Reactions   Avelox [Moxifloxacin Hcl In Nacl] Hives   Linezolid Rash   Venlafaxine Nausea Only, Rash and Nausea And Vomiting   Rosuvastatin Other (See Comments)    myalgias   Cephalexin Itching, Rash and Dermatitis   Clindamycin Diarrhea   Nitrofurantoin Itching, Rash and Dermatitis   Other Rash    Brewer yeast and Bakers yeast causes cracks in mouth stomach bloating    Penicillins Itching and Rash    Has patient had a PCN reaction causing anaphylaxis, immediate rash, facial/tongue/throat swelling, SOB or  lightheadedness with hypotension? no  Has patient had a PCN reaction causing severe rash involving mucus membranes or skin necrosis?  no  Has patient had a PCN reaction that required hospitilization?no  Has patient had a PCN reaction occurring within the last 10 years?  no  If all of the above answers are "no" then may proceed with cephalosporin use.  Has patient had a PCN reaction causing anaphylaxis, immediate rash, facial/tongue/throat swelling, SOB or lightheadedness with hypotension? no, Has patient had a PCN reaction causing severe rash involving mucus membranes or skin necrosis?  no, Has patient had a PCN reaction that required hospitilization?no, , Has patient had a PCN reaction occurring within the last 10 years?  no, If all of the above answers are "no" then may proceed with cephalosporin use.   Statins Other (See Comments)    Muscle pain  Other Reaction(s): Other (See Comments)    Assessment/Plan:  1. Hyperlipidemia - Patient with LDL 116 mg/dL (on 1/61/09603/28/2024) on no LLT above goal < 70 mg/dL. Lifestyle modifications were discussed, including dietary improvements, increased physical activity, and continued abstinence from tobacco. Patient seems receptive to incorporating these lifestyle modifications. The possibility of starting an alternative statin therapy was discussed. Patient expressed preference for pravastatin. She was counseled on the CV risks associated with dyslipidemia.   - Initiate pravastatin 40 mg daily - Continue ezetimibe 10 mg daily - Encouraged to cut back on high-fat, sugary foods and to increase physical activity - Follow-up NMR, LFT, A1C- pt will [facility] middle to end of June.  Patient seen with Michiel CowboyAlex Judeen Geralds, PharmD Candidate.  Thank you,  Olene FlossMelissa D Maccia, Pharm.D, BCPS, CPP [facility] [address], [place], [place] [address]  Phone: [phone]; Fax: [phone]

## 2022-09-12 NOTE — Progress Notes (Deleted)
Patient ID: Taylor Taylor [name]                 DOB: April 26, 1951                    MRN: [id number]      HPI: Taylor [name] is a 72 y.o. female patient referred to lipid clinic by Dr. Tenny Crawoss. PMH is significant for OSA, COPD, DM, hypothyroidism, GERD Aortic Atherosclerosis on CT and PAF.  DM or preDM? HTA  Reviewed options for lowering LDL cholesterol, including ezetimibe, PCSK-9 inhibitors, bempedoic acid and inclisiran.  Discussed mechanisms of action, dosing, side effects and potential decreases in LDL cholesterol.  Also reviewed cost information and potential options for patient assistance.   Current Medications: zetia? Intolerances: rosuvastatin 10mg  Risk Factors: DM, CKD, aortic athrosclerosis LDL-C goal: <70 ApoB goal: <80  Diet:   Exercise:   Family History: The patient's family history includes Diabetes in her maternal grandfather; Heart attack in her paternal grandfather; Heart disease in an other family member.   Social History:   Labs: Lipid Panel  09/03/21 LDL-P 1373, LDL-C 116, HDL 63 (zetia 10mg  daily)    Component Value Date/Time   CHOL 192 05/07/2022 1217   TRIG 75 05/07/2022 1217   HDL 59 05/07/2022 1217   CHOLHDL 3.3 05/07/2022 1217   CHOLHDL 4.2 CALC 11/17/2007 0844   VLDL 18 11/17/2007 0844   LDLCALC 119 (H) 05/07/2022 1217   LABVLDL 14 05/07/2022 1217    Past Medical History:  Diagnosis Date   Anxiety    Arthritis    "hands, neck, back" (07/22/2013)   Atrial fibrillation (HCC)    CHEST DISCOMFORT    Chronic back pain    "started in lower back; getting to be all over my back" (07/22/2013)   Chronic bronchitis (HCC)    "I've had it 2 years in a row; do have some trouble breathing at times; use an inhaler prn; not asthma" (07/22/2013)   Chronic neck pain    COPD (chronic obstructive pulmonary disease) (HCC)    DDD (degenerative disc disease), cervical    DDD (degenerative disc disease), lumbar    Depression    Dysrhythmia    GERD (gastroesophageal  reflux disease)    Hepatitis 1971   "don't know if it was A or B" (07/22/2013)   HYPERLIPIDEMIA-MIXED 2005   "went away after I started taking fish oil" (07/22/2013)   Hypothyroidism    Palpitations    Pneumonia 2013   Type II diabetes mellitus (HCC)    "diet controlled" (07/22/2013)    Current Outpatient Medications on File Prior to Visit  Medication Sig Dispense Refill   acetaminophen (TYLENOL) 500 MG tablet Take 1,000 mg by mouth every 6 (six) hours as needed for moderate pain or headache.      albuterol (PROVENTIL) (2.5 MG/3ML) 0.083% nebulizer solution Take 3 mLs (2.5 mg total) by nebulization every 4 (four) hours as needed for wheezing or shortness of breath. 75 mL 2   albuterol (VENTOLIN HFA) 108 (90 Base) MCG/ACT inhaler Inhale 1 puff into the lungs every 6 (six) hours as needed for wheezing or shortness of breath. 1 each 1   Alpha-Lipoic Acid 100 MG CAPS Take 1 capsule by mouth daily.     Ascorbic Acid (VITAMIN C) 1000 MG tablet Take 1,000 mg by mouth daily.     aspirin EC 81 MG tablet Take 81 mg by mouth daily.     buPROPion (WELLBUTRIN XL) 150 MG 24  hr tablet Take 150 mg by mouth 2 (two) times daily.     cetirizine (ZYRTEC) 10 MG tablet Take 10 mg by mouth daily.      Cholecalciferol 50 MCG (2000 UT) CAPS Take 1 capsule by mouth daily.     Cinnamon 500 MG TABS Take 1,000 mg by mouth daily.     ESTRADIOL TD Apply 1 mL topically daily. Estradiol 7.5mg /gm cream ( apply to thighs)     FLUoxetine (PROZAC) 20 MG tablet Take 40 mg by mouth daily.     fluticasone-salmeterol (ADVAIR) 500-50 MCG/ACT AEPB Inhale 1 puff into the lungs in the morning and at bedtime. 180 each 3   furosemide (LASIX) 80 MG tablet Take 1 tablet (80 mg total) by mouth daily. 90 tablet 2   Guaifenesin 1200 MG TB12 Take 1,200 mg by mouth daily.     hydrOXYzine (ATARAX/VISTARIL) 50 MG tablet Take 50 mg by mouth daily as needed for itching.      JARDIANCE 10 MG TABS tablet Take 1 tablet by mouth daily before  breakfast.     ketoconazole (NIZORAL) 2 % cream Apply 1 application topically daily.      methocarbamol (ROBAXIN) 500 MG tablet Take 500 mg by mouth every 6 (six) hours as needed for muscle spasms.     metoprolol tartrate (LOPRESSOR) 25 MG tablet Take 1 tablet (25 mg total) by mouth 2 (two) times daily. 180 tablet 2   montelukast (SINGULAIR) 10 MG tablet Take 10 mg by mouth at bedtime.     OIL OF OREGANO PO Take 1-2 capsules by mouth daily as needed (cold symptoms).      OVER THE COUNTER MEDICATION Take 2 tablets by mouth daily at 6 (six) AM. SACROMYCES bouliardi (probiotic)     Oxycodone HCl 10 MG TABS Take 10 mg by mouth 5 (five) times daily as needed (pain).     pantoprazole (PROTONIX) 40 MG tablet Take 1 tablet (40 mg total) by mouth 2 (two) times daily. 60 tablet 5   potassium chloride (KLOR-CON M10) 10 MEQ tablet Take 1 tablet (10 mEq total) by mouth daily. 90 tablet 3   pregabalin (LYRICA) 100 MG capsule Take 100 mg by mouth 3 (three) times daily.     progesterone (PROMETRIUM) 100 MG capsule Take 300 mg by mouth every evening.      progesterone (PROMETRIUM) 100 MG capsule Take 100 mg by mouth at bedtime.     pseudoephedrine (SUDAFED) 120 MG 12 hr tablet Take 120 mg by mouth daily.     TESTOSTERONE TD Place 1-2 application onto the skin See admin instructions. Testosterone 2% cream. Apply 1 click's worth of testosterone cream on Sun, Tues, Thurs, and Sat. Apply 2 click's worth on Mon, Wed, and Fri     thyroid (ARMOUR) 60 MG tablet Take 30-120 mg by mouth See admin instructions. Take 120 mg in the morning and 30 mg in the afternoon     Tiotropium Bromide Monohydrate (SPIRIVA RESPIMAT) 2.5 MCG/ACT AERS Inhale 2 puffs into the lungs daily. 4 g 5   Tiotropium Bromide Monohydrate (SPIRIVA RESPIMAT) 2.5 MCG/ACT AERS Inhale 2 each into the lungs daily.     tiZANidine (ZANAFLEX) 4 MG tablet Take 2-4 mg by mouth 4 (four) times daily as needed for muscle spasms. 2 mg 3 times daily - 4 mg at bedtime      TURMERIC PO Take 1,440 mg by mouth every evening.     Vitamin D-Vitamin K (VITAMIN K2-VITAMIN D3 PO) Take 1  capsule by mouth every evening.     No current facility-administered medications on file prior to visit.    Allergies  Allergen Reactions   Avelox [Moxifloxacin Hcl In Nacl] Hives   Linezolid Rash   Venlafaxine Nausea Only, Rash and Nausea And Vomiting   Rosuvastatin Other (See Comments)    myalgias   Cephalexin Itching, Rash and Dermatitis   Clindamycin Diarrhea   Nitrofurantoin Itching, Rash and Dermatitis   Other Rash    Brewer yeast and Bakers yeast causes cracks in mouth stomach bloating    Penicillins Itching and Rash    Has patient had a PCN reaction causing anaphylaxis, immediate rash, facial/tongue/throat swelling, SOB or lightheadedness with hypotension? no  Has patient had a PCN reaction causing severe rash involving mucus membranes or skin necrosis?  no  Has patient had a PCN reaction that required hospitilization?no  Has patient had a PCN reaction occurring within the last 10 years?  no  If all of the above answers are "no" then may proceed with cephalosporin use.  Has patient had a PCN reaction causing anaphylaxis, immediate rash, facial/tongue/throat swelling, SOB or lightheadedness with hypotension? no, Has patient had a PCN reaction causing severe rash involving mucus membranes or skin necrosis?  no, Has patient had a PCN reaction that required hospitilization?no, , Has patient had a PCN reaction occurring within the last 10 years?  no, If all of the above answers are "no" then may proceed with cephalosporin use.   Statins Other (See Comments)    Muscle pain  Other Reaction(s): Other (See Comments)    Assessment/Plan:  1. Hyperlipidemia -  No problem-specific Assessment & Plan notes found for this encounter.    Thank you,  Olene FlossMelissa D Trinten Boudoin, Pharm.D, BCPS, CPP [facility] [address],  [place], [place] [address]  Phone: [phone]; Fax: [phone]

## 2022-09-12 NOTE — Patient Instructions (Signed)
Start taking pravastatin 40mg  daily Continue taking ezetimibe 10mg  daily  Try to increase physical activity and decrease portion size

## 2022-09-25 ENCOUNTER — Telehealth: Payer: Self-pay | Admitting: Gastroenterology

## 2022-09-25 NOTE — Telephone Encounter (Signed)
That is fine, I am happy to see her in the office.

## 2022-09-25 NOTE — Telephone Encounter (Signed)
Good morning Dr. Adela Lank,  Supervising Provider Am 09/25/22,  We received a referral for patient for Gastroesophageal reflux disease without esophagitis. She is requesting a transfer of care due to wanting her providers in the [place] group and her PCP referred her to [facility]. She has history with [facility] in 2021. Her records are in [facility] for you to review and advise on scheduling.   Thank you.

## 2022-09-26 ENCOUNTER — Ambulatory Visit: Payer: PPO | Admitting: Nurse Practitioner

## 2022-09-30 ENCOUNTER — Encounter: Payer: Self-pay | Admitting: Gastroenterology

## 2022-09-30 NOTE — Telephone Encounter (Signed)
Patient will call back tomorrow to schedule.

## 2022-09-30 NOTE — Telephone Encounter (Signed)
Pt scheduled 7/23 at 9:20 OV Dr Adela Lank

## 2022-10-03 ENCOUNTER — Other Ambulatory Visit: Payer: Self-pay | Admitting: Internal Medicine

## 2022-10-03 ENCOUNTER — Ambulatory Visit (HOSPITAL_COMMUNITY): Payer: PPO | Attending: Cardiology

## 2022-10-03 DIAGNOSIS — I480 Paroxysmal atrial fibrillation: Secondary | ICD-10-CM | POA: Diagnosis present

## 2022-10-03 DIAGNOSIS — I5040 Unspecified combined systolic (congestive) and diastolic (congestive) heart failure: Secondary | ICD-10-CM

## 2022-10-03 LAB — ECHOCARDIOGRAM COMPLETE
Area-P 1/2: 3.31 cm2
Calc EF: 59.4 %
S' Lateral: 3.3 cm
Single Plane A2C EF: 53.3 %
Single Plane A4C EF: 65.4 %

## 2022-10-09 ENCOUNTER — Ambulatory Visit: Payer: PPO | Admitting: Nurse Practitioner

## 2022-10-13 ENCOUNTER — Other Ambulatory Visit: Payer: PPO

## 2022-11-04 ENCOUNTER — Other Ambulatory Visit: Payer: Self-pay | Admitting: Neurosurgery

## 2022-11-04 ENCOUNTER — Encounter: Payer: Self-pay | Admitting: Neurosurgery

## 2022-11-04 DIAGNOSIS — M542 Cervicalgia: Secondary | ICD-10-CM

## 2022-11-10 ENCOUNTER — Encounter: Payer: Self-pay | Admitting: Internal Medicine

## 2022-11-11 ENCOUNTER — Other Ambulatory Visit: Payer: PPO

## 2022-11-15 ENCOUNTER — Ambulatory Visit (INDEPENDENT_AMBULATORY_CARE_PROVIDER_SITE_OTHER): Payer: PPO

## 2022-11-15 DIAGNOSIS — M542 Cervicalgia: Secondary | ICD-10-CM

## 2022-11-17 NOTE — Telephone Encounter (Signed)
She is set for repeat labs soon   A1C is part of panel

## 2022-11-20 LAB — HEPATIC FUNCTION PANEL
ALT: 18 IU/L (ref 0–32)
AST: 20 IU/L (ref 0–40)
Albumin: 4.3 g/dL (ref 3.9–4.9)
Alkaline Phosphatase: 110 IU/L (ref 44–121)
Bilirubin Total: 0.5 mg/dL (ref 0.0–1.2)
Bilirubin, Direct: 0.12 mg/dL (ref 0.00–0.40)
Total Protein: 7.5 g/dL (ref 6.0–8.5)

## 2022-11-20 LAB — NMR, LIPOPROFILE
Cholesterol, Total: 195 mg/dL (ref 100–199)
HDL Particle Number: 38.4 umol/L (ref >=30.5)
HDL-C: 62 mg/dL (ref >39)
LDL Particle Number: 1398 nmol/L — ABNORMAL HIGH (ref <1000)
LDL Size: 21.4 nm (ref >20.5)
LDL-C (NIH Calc): 113 mg/dL — ABNORMAL HIGH (ref 0–99)
LP-IR Score: <25 (ref <=45)
Small LDL Particle Number: 563 nmol/L — ABNORMAL HIGH (ref <=527)
Triglycerides: 110 mg/dL (ref 0–149)

## 2022-11-20 LAB — HEMOGLOBIN A1C
Est. average glucose Bld gHb Est-mCnc: 126 mg/dL
Hgb A1c MFr Bld: 6 % — ABNORMAL HIGH (ref 4.8–5.6)

## 2022-11-26 ENCOUNTER — Encounter: Payer: Self-pay | Admitting: Pharmacist

## 2022-12-12 ENCOUNTER — Encounter: Payer: Self-pay | Admitting: Emergency Medicine

## 2022-12-30 ENCOUNTER — Ambulatory Visit: Payer: PPO | Admitting: Gastroenterology

## 2022-12-30 ENCOUNTER — Encounter: Payer: Self-pay | Admitting: Gastroenterology

## 2022-12-30 VITALS — BP 110/56 | HR 68 | Ht 62.75 in | Wt 229.5 lb

## 2022-12-30 DIAGNOSIS — Z8701 Personal history of pneumonia (recurrent): Secondary | ICD-10-CM

## 2022-12-30 DIAGNOSIS — K219 Gastro-esophageal reflux disease without esophagitis: Secondary | ICD-10-CM | POA: Diagnosis not present

## 2022-12-30 DIAGNOSIS — J4489 Other specified chronic obstructive pulmonary disease: Secondary | ICD-10-CM | POA: Diagnosis not present

## 2022-12-30 DIAGNOSIS — Z79899 Other long term (current) drug therapy: Secondary | ICD-10-CM | POA: Diagnosis not present

## 2022-12-30 DIAGNOSIS — Z86010 Personal history of colonic polyps: Secondary | ICD-10-CM

## 2022-12-30 MED ORDER — SUCRALFATE 1 GM/10ML PO SUSP: 1.0000 g | Freq: Four times a day (QID) | ORAL | 1 refills | Status: DC | PRN

## 2022-12-30 MED ORDER — ESOMEPRAZOLE MAGNESIUM 40 MG PO CPDR: 40.0000 mg | DELAYED_RELEASE_CAPSULE | Freq: Two times a day (BID) | ORAL | 3 refills | Status: DC

## 2022-12-30 NOTE — Progress Notes (Signed)
HPI :  72 year old female with multiple medical problems to include COPD with bronchitis, possible asthmatic component, GERD, recurrent pneumonia, history of diastolic CHF, history of colon polyps, referred by Dr. Delton Coombes in pulmonology for GERD and potential contribution to her respiratory issues.  She reports having 15 episodes of pneumonia over the past 9 years, starting in 2015.  She has been hospitalized on multiple occasions due to this.  Her last episode was in December 2023, she had multifocal pneumonia.  She follows with Dr. Delton Coombes of pulmonary.  She is thought to have COPD with chronic bronchitis, and potential component of asthma.  She is not on oxygen at baseline, but has required this when hospitalized and shortly thereafter in the past.  They have question whether or not reflux is contributing to her respiratory issues and if she is having aspiration episodes.  The patient states she has had reflux for years.  Previously on Prilosec which worked for some time but she has been on Protonix 40 mg twice daily for the past year.  Despite taking this every day twice daily she still has heartburn about half the days a week that bother her.  She also has some regurgitation at times that bothers her.  She denies any nocturnal symptoms.  She sleeps well using a pillow to keep her head of the bed elevated.  She denies any overt aspiration episodes that she is aware of.  She has occasional rare dysphagia.  She takes Tums as needed for breakthrough at times.  She is never tried anything other than Prilosec and Protonix and states they both work about the same.  She denies any nausea or vomiting.  No abdominal pains.  She denies any problems with her bowels.  She has had a history of diastolic CHF however her last echocardiogram showed normalization of these changes in April.  She is currently breathing without any supplemental oxygen and states she has some dyspnea but otherwise her breathing has been  okay at baseline.  She is compliant with her inhalers.  She has a history of A-fib and has had that ablated in the past and has not had recurrence which she is aware of.  She is not on any anticoagulation.  She has BMI of almost 41.  She states she had lost 50 pounds using weight watchers last year but has regained 30 pounds of it.  She actually has worked for [employer] in the past.  She has been on chronic PPI for some time now.  She had a DEXA scan in 2023 which was normal  She thinks she has had an endoscopy a very long time ago, cannot remember how long it was.  She had a colonoscopy in February 2021 in [place].  She had numerous polyps removed and told she needed another exam in 3 years.  I do not see formal colonoscopy report in her chart today but I do see pathology records with adenoma and sessile serrated polyps, numerous polyps removed.  Prior workup: Echo 10/03/22: EF 60-65%, otherwise normal  CT abdomen / pelvis 07/15/2008: IMPRESSION:  Focal inflammation of the colon at the level of the hepatic  flexure.  Differential considerations include right-sided  diverticulitis or focal colitis.  Tumor is felt much less likely  based on CT appearance.  No evidence of perforation, abscess or  bowel obstruction.   Colonoscopy 07/11/19: SURGICAL PATHOLOGY REPORT  FINAL PATHOLOGIC DIAGNOSIS  MICROSCOPIC EXAMINATION AND DIAGNOSIS  COLON POLYPS, POLYPECTOMY:  Sessile serrated polyp.      Tubular adenoma.      Hyperplastic polyp.     Past Medical History:  Diagnosis Date   Anxiety    Arthritis    "hands, neck, back" (07/22/2013)   Asthma    Atrial fibrillation (HCC)    CHEST DISCOMFORT    CHF (congestive heart failure) (HCC)    Chronic back pain    "started in lower back; getting to be all over my back" (07/22/2013)   Chronic bronchitis (HCC)    "I've had it 2 years in a row; do have some trouble breathing at times; use an inhaler prn; not asthma" (07/22/2013)    Chronic neck pain    Colon polyps    COPD (chronic obstructive pulmonary disease) (HCC)    DDD (degenerative disc disease), cervical    DDD (degenerative disc disease), lumbar    Depression    Dysrhythmia    GERD (gastroesophageal reflux disease)    Hepatitis 1971   "don't know if it was A or B" (07/22/2013)   HYPERLIPIDEMIA-MIXED 2005   "went away after I started taking fish oil" (07/22/2013)   Hypothyroidism    Palpitations    Pneumonia 2013   Sleep apnea treated with continuous positive airway pressure (CPAP)    Type II diabetes mellitus (HCC)    "diet controlled" (07/22/2013)     Past Surgical History:  Procedure Laterality Date   ANTERIOR CERVICAL DECOMP/DISCECTOMY FUSION  ?2003   "w/plating" (07/22/2013)   ATRIAL FIBRILLATION ABLATION N/A 09/21/2019   Procedure: ATRIAL FIBRILLATION ABLATION;  Surgeon: Regan Lemming, MD;  Location: [facility]VASIVE [facility];  Service: Cardiovascular;  Laterality: N/A;   BUNIONECTOMY Left 06/10/2011   DILATION AND CURETTAGE OF UTERUS  06/09/1974   PLANTAR FASCIA SURGERY Right 06/09/2009   WISDOM TOOTH EXTRACTION     Family History  Problem Relation Age of Onset   Hyperlipidemia Mother    Brain cancer Father    Atrial fibrillation Brother    Diabetes Maternal Grandfather    Heart attack Paternal Grandfather    Heart disease Other    Hypertension Son    Social History   Tobacco Use   Smoking status: Former    Current packs/day: 0.00    Average packs/day: 1 pack/day for 40.0 years (40.0 ttl pk-yrs)    Types: Cigarettes    Start date: 12/22/1967    Quit date: 12/22/2007    Years since quitting: 15.0   Smokeless tobacco: Never  Vaping Use   Vaping status: Never Used  Substance Use Topics   Alcohol use: Not Currently    Comment: 07/22/2013 "last drink was in ~ 2005"   Drug use: No   Current Outpatient Medications  Medication Sig Dispense Refill   acetaminophen (TYLENOL) 500 MG tablet Take 1,000 mg by mouth every 6 (six) hours as  needed for moderate pain or headache.      albuterol (PROVENTIL) (2.5 MG/3ML) 0.083% nebulizer solution Take 3 mLs (2.5 mg total) by nebulization every 4 (four) hours as needed for wheezing or shortness of breath. 75 mL 2   albuterol (VENTOLIN HFA) 108 (90 Base) MCG/ACT inhaler Inhale 1 puff into the lungs every 6 (six) hours as needed for wheezing or shortness of breath. 1 each 1   Alpha-Lipoic Acid 100 MG CAPS Take 1 capsule by mouth daily.     Ascorbic Acid (VITAMIN C) 1000 MG tablet Take 1,000 mg by mouth daily.     Ashwagandha 120 MG CAPS  Take by mouth.     aspirin EC 81 MG tablet Take 81 mg by mouth daily.     Biotin 1 MG CAPS Take by mouth.     buPROPion (WELLBUTRIN XL) 150 MG 24 hr tablet Take 150 mg by mouth 2 (two) times daily.     cetirizine (ZYRTEC) 10 MG tablet Take 10 mg by mouth daily.      Cinnamon 500 MG TABS Take 1,000 mg by mouth daily.     Coenzyme Q10 (COQ10) 200 MG CAPS Take by mouth.     ESTRADIOL TD Apply 1 mL topically daily. Estradiol 7.5mg /gm cream ( apply to thighs)     FLUoxetine (PROZAC) 20 MG tablet Take 40 mg by mouth daily.     fluticasone-salmeterol (ADVAIR) 500-50 MCG/ACT AEPB Inhale 1 puff into the lungs in the morning and at bedtime. 180 each 3   furosemide (LASIX) 80 MG tablet Take 1 tablet (80 mg total) by mouth daily. 90 tablet 2   glucosamine-chondroitin 500-400 MG tablet Take 1 tablet by mouth 3 (three) times daily.     Guaifenesin 1200 MG TB12 Take 1,200 mg by mouth daily.     hydrOXYzine (ATARAX/VISTARIL) 50 MG tablet Take 50 mg by mouth daily as needed for itching.      ketoconazole (NIZORAL) 2 % cream Apply 1 application topically daily.      metoprolol tartrate (LOPRESSOR) 25 MG tablet Take 1 tablet (25 mg total) by mouth 2 (two) times daily. 180 tablet 2   montelukast (SINGULAIR) 10 MG tablet Take 10 mg by mouth at bedtime.     OIL OF OREGANO PO Take 1-2 capsules by mouth daily as needed (cold symptoms).      Omega-3 Fatty Acids (FISH OIL) 1000  MG CAPS Take by mouth.     OSHA PO Take by mouth.     OVER THE COUNTER MEDICATION Take 2 tablets by mouth daily at 6 (six) AM. SACROMYCES bouliardi (probiotic)     Oxycodone HCl 10 MG TABS Take 10 mg by mouth 5 (five) times daily as needed (pain).     pantoprazole (PROTONIX) 40 MG tablet Take 1 tablet (40 mg total) by mouth 2 (two) times daily. 60 tablet 5   potassium chloride (KLOR-CON M10) 10 MEQ tablet Take 1 tablet (10 mEq total) by mouth daily. 90 tablet 3   pregabalin (LYRICA) 100 MG capsule Take 100 mg by mouth 3 (three) times daily.     progesterone (PROMETRIUM) 100 MG capsule Take 300 mg by mouth every evening.      pseudoephedrine (SUDAFED) 120 MG 12 hr tablet Take 120 mg by mouth daily.     TESTOSTERONE TD Place 1-2 application onto the skin See admin instructions. Testosterone 2% cream. Apply 1 click's worth of testosterone cream on Sun, Tues, Thurs, and Sat. Apply 2 click's worth on Mon, Wed, and Fri     thyroid (ARMOUR) 60 MG tablet Take 30-120 mg by mouth See admin instructions. Take 120 mg in the morning and 30 mg in the afternoon     tiZANidine (ZANAFLEX) 4 MG tablet Take 2-4 mg by mouth 4 (four) times daily as needed for muscle spasms. 2 mg 3 times daily - 4 mg at bedtime     TURMERIC PO Take 1,440 mg by mouth every evening.     Vitamin D-Vitamin K (VITAMIN K2-VITAMIN D3 PO) Take 1 capsule by mouth every evening.     Zinc 20 MG CAPS Take by mouth.  No current facility-administered medications for this visit.   Allergies  Allergen Reactions   Avelox [Moxifloxacin Hcl In Nacl] Hives   Linezolid Rash   Venlafaxine Nausea Only, Rash and Nausea And Vomiting   Rosuvastatin Other (See Comments)    myalgias   Lovastatin    Cephalexin Itching, Rash and Dermatitis   Nitrofurantoin Itching, Rash and Dermatitis   Other Rash    Brewer yeast and Bakers yeast causes cracks in mouth stomach bloating    Penicillins Itching and Rash    Has patient had a PCN reaction causing  anaphylaxis, immediate rash, facial/tongue/throat swelling, SOB or lightheadedness with hypotension? no  Has patient had a PCN reaction causing severe rash involving mucus membranes or skin necrosis?  no  Has patient had a PCN reaction that required hospitilization?no  Has patient had a PCN reaction occurring within the last 10 years?  no  If all of the above answers are "no" then may proceed with cephalosporin use.  Has patient had a PCN reaction causing anaphylaxis, immediate rash, facial/tongue/throat swelling, SOB or lightheadedness with hypotension? no, Has patient had a PCN reaction causing severe rash involving mucus membranes or skin necrosis?  no, Has patient had a PCN reaction that required hospitilization?no, , Has patient had a PCN reaction occurring within the last 10 years?  no, If all of the above answers are "no" then may proceed with cephalosporin use.   Statins Other (See Comments)    Muscle pain  Other Reaction(s): Other (See Comments)     Review of Systems: All systems reviewed and negative except where noted in HPI.   Lab Results  Component Value Date   WBC 8.2 06/06/2022   HGB 14.0 06/06/2022   HCT 41.5 06/06/2022   MCV 90.0 06/06/2022   PLT 232 06/06/2022    Lab Results  Component Value Date   ALT 18 11/19/2022   AST 20 11/19/2022   ALKPHOS 110 11/19/2022   BILITOT 0.5 11/19/2022     Physical Exam: BP (!) 110/56 (BP Location: Left Arm, Patient Position: Sitting, Cuff Size: Normal)   Pulse 68   Ht 5' 2.75" (1.594 m) Comment: height measured without shoes  Wt 229 lb 8 oz (104.1 kg)   BMI 40.98 kg/m  Constitutional: Pleasant,well-developed, female in no acute distress. HEENT: Normocephalic and atraumatic. Conjunctivae are normal. No scleral icterus. Neck supple.  Cardiovascular: Normal rate, regular rhythm.  Pulmonary/chest: Effort normal and breath sounds normal. Mild rhonchi bilaterally. Abdominal: Soft, nondistended, nontender. There are no  masses palpable. Extremities: no edema Lymphadenopathy: No cervical adenopathy noted. Neurological: Alert and oriented to person place and time. Skin: Skin is warm and dry. No rashes noted. Psychiatric: Normal mood and affect. Behavior is normal.   ASSESSMENT: 72 y.o. female here for assessment of the following  1. Gastroesophageal reflux disease, unspecified whether esophagitis present   2. Long-term current use of proton pump inhibitor therapy   3. COPD with chronic bronchitis   4. History of pneumonia   5. History of colon polyps    History of longstanding reflux, has been on both omeprazole and Protonix over time.  Currently on high-dose Protonix with fairly frequent breakthrough of her heartburn and occasional regurgitation.  She has had multiple episodes of pneumonia over the past many years, has baseline COPD with possible component of asthma, pulmonary has questioned if reflux is related to her baseline respiratory issues, as well as aspiration events leading to her pneumonia.  She denies any obvious aspiration events when  lying down or at night.  Certainly with poor control of her GERD symptoms that this could be related to her chronic respiratory issues.  Wonder if she has nonacid reflux driving this process.  We discussed ways to evaluate this.  I offered her an upper endoscopy to assess for erosive changes, large hiatal hernia etc., screen for Barrett's esophagus.  I discussed risks and benefits of the procedure and anesthesia with her.  She feels her respiratory status is stable at this time and controlled with use of her inhalers.  She wishes to proceed with endoscopy.  Recommend we try switching her PPI in the interim, we will try Nexium 40 mg twice daily, take half hour prior to meals.  Will also give her liquid Carafate to use as needed for breakthrough.  Pending findings, will discuss options.  Unfortunately given her body mass index and comorbidities she is not a good  candidate for reflux surgery.  She is not a good candidate for TIF at her current weight.  I ultimately think weight loss would help her in a variety of ways, it could really help her reflux.  We discussed her weight in general, she finds success in weight watchers and seems motivated to go back on that.  I offered her a referral to the weight loss clinic and she wants to hold off on that work on this on her own for now.  If she needs assistance with this in the future she should contact me for referral.  Of note she is due for colonoscopy but wants to address her reflux issues first.  We can do her colonoscopy in the upcoming months.  PLAN: - stop protonix - switch to nexium 40mg  twice daily - take 1/2 hr prior to meal (can consider Dexilant but I doubt would be covered by insurance) - add liquid carafate 10cc every 6 hours PRN  - schedule for EGD at the [facility] - on 7/25 PM - discussed importance of weight loss - she will work on this, using [employer] - needs colonoscopy at some point, will do this once more acute issues addressed  Harlin Rain, MD [facility]  CC: Noemi Chapel, NP

## 2022-12-30 NOTE — Patient Instructions (Addendum)
_______________________________________________________  If your blood pressure at your visit was 140/90 or greater, please contact your primary care physician to follow up on this.  _______________________________________________________  If you are age 70 or older, your body mass index should be between 23-30. Your Body mass index is 40.98 kg/m. If this is out of the aforementioned range listed, please consider follow up with your Primary Care Provider.  If you are age 4 or younger, your body mass index should be between 19-25. Your Body mass index is 40.98 kg/m. If this is out of the aformentioned range listed, please consider follow up with your Primary Care Provider.   ________________________________________________________  The [facility] providers would like to encourage you to use [facility] to communicate with providers for non-urgent requests or questions.  Due to long hold times on the telephone, sending your provider a message by [facility] may be a faster and more efficient way to get a response.  Please allow 48 business hours for a response.  Please remember that this is for non-urgent requests.  _______________________________________________________   Bonita Quin have been scheduled for an endoscopy. Please follow written instructions given to you at your visit today.  If you use inhalers (even only as needed), please bring them with you on the day of your procedure.  If you take any of the following medications, they will need to be adjusted prior to your procedure:   DO NOT TAKE 7 DAYS PRIOR TO TEST- Trulicity (dulaglutide) Ozempic, Wegovy (semaglutide) Mounjaro (tirzepatide) Bydureon Bcise (exanatide extended release)  DO NOT TAKE 1 DAY PRIOR TO YOUR TEST Rybelsus (semaglutide) Adlyxin (lixisenatide) Victoza (liraglutide) Byetta (exanatide) __________________________________________________________________________ We have sent the following medications to your pharmacy for  you to pick up at your convenience:  Carafate suspension: take 10 ml every 6 hours as needed Nexium 40 mg: take twice a day 30 minutes before meals Stop Protonix  Please continue to work on weight loss.  We will arrange for a colonoscopy in the follow months.  Thank you for entrusting me with your care and for choosing [facility], Dr. Ileene Patrick

## 2023-01-01 ENCOUNTER — Ambulatory Visit (AMBULATORY_SURGERY_CENTER): Payer: PPO | Admitting: Gastroenterology

## 2023-01-01 ENCOUNTER — Encounter: Payer: Self-pay | Admitting: Gastroenterology

## 2023-01-01 VITALS — BP 148/73 | HR 66 | Temp 98.2°F | Resp 17 | Ht 62.0 in | Wt 229.0 lb

## 2023-01-01 DIAGNOSIS — K317 Polyp of stomach and duodenum: Secondary | ICD-10-CM

## 2023-01-01 DIAGNOSIS — K219 Gastro-esophageal reflux disease without esophagitis: Secondary | ICD-10-CM

## 2023-01-01 MED ORDER — SODIUM CHLORIDE 0.9 % IV SOLN: 500.0000 mL | Freq: Once | INTRAVENOUS | Status: DC

## 2023-01-01 NOTE — Op Note (Signed)
[facility] Patient Name: Taylor [name] Procedure Date: 01/01/2023 3:03 PM MRN: [id number] Endoscopist: Viviann Spare P. Adela Lank , MD, [id number] Age: 72 Referring MD:  Date of Birth: 12-04-1950 Gender: Female Account #: [id number] Procedure:                Upper GI endoscopy Indications:              suspected gastro-esophageal reflux disease - on                            high dose protonix with continued symptoms, also                            COPD / chronic bronchitis with possible component                            of asthma with concern GERD is contributing to                            respiratory symptoms, history of recurrent                            pneumonia with possible aspiration Medicines:                Monitored Anesthesia Care Procedure:                Pre-Anesthesia Assessment:                           - Prior to the procedure, a History and Physical                            was performed, and patient medications and                            allergies were reviewed. The patient's tolerance of                            previous anesthesia was also reviewed. The risks                            and benefits of the procedure and the sedation                            options and risks were discussed with the patient.                            All questions were answered, and informed consent                            was obtained. Prior Anticoagulants: The patient has                            taken no anticoagulant or antiplatelet agents. ASA  Grade Assessment: III - A patient with severe                            systemic disease. After reviewing the risks and                            benefits, the patient was deemed in satisfactory                            condition to undergo the procedure.                           After obtaining informed consent, the endoscope was                            passed under  direct vision. Throughout the                            procedure, the patient's blood pressure, pulse, and                            oxygen saturations were monitored continuously. The                            Olympus Scope [id number] was introduced through the                            mouth, and advanced to the second part of duodenum.                            The upper GI endoscopy was accomplished without                            difficulty. The patient tolerated the procedure                            well. Scope In: Scope Out: Findings:                 Esophagogastric landmarks were identified: the                            Z-line was found at 37 cm, the gastroesophageal                            junction was found at 37 cm and the upper extent of                            the gastric folds was found at 37 cm from the                            incisors. No hiatal hernia.  The exam of the esophagus was otherwise normal. No                            erosive changes.                           Biopsies were obtained from the proximal and distal                            esophagus with cold forceps to rule out                            eosinophilic esophagitis.                           Numerous / multiple small sessile and flat polyps                            were found in the gastric fundus and in the gastric                            body, hyperplastic appearing. Biopsies were taken                            with a cold forceps from a few of them for                            histology.                           The exam of the stomach was otherwise normal.                           The examined duodenum was normal. Complications:            No immediate complications. Estimated blood loss:                            Minimal. Estimated Blood Loss:     Estimated blood loss was minimal. Impression:               - Esophagogastric  landmarks identified. No hiatal                            hernia.                           - Normal esophagus otherwise. Biopsies taken.                           - Multiple gastric polyps, suspect benign /                            hyperplastic. Sampled with biopsies.                           -  Normal stomach otherwise                           - Normal examined duodenum.                           No erosive changes or significant hiatal hernia.                            Patient has nonerosive reflux disease. Will see how                            she responds to change in regimen. Would benefit                            from weight loss as discussed at office visit. Recommendation:           - Patient has a contact number available for                            emergencies. The signs and symptoms of potential                            delayed complications were discussed with the                            patient. Return to normal activities tomorrow.                            Written discharge instructions were provided to the                            patient.                           - Resume previous diet.                           - Continue present medications.                           - Continue trial of nexium twice daily                           - Await pathology results.                           - If symptoms persist consideration for manometry                            and 24 hour pH testing on PPI to further evaluate Taylor [name] P. Jamel Dunton, MD 01/01/2023 3:27:04 PM This report has been signed electronically.

## 2023-01-01 NOTE — Patient Instructions (Signed)

## 2023-01-01 NOTE — Progress Notes (Signed)
History and Physical Interval Note: patient seen on 12/30/22 - history of GERD and recurrent pneumonia, concern for possible aspiration. On high dose protonix symptoms were not controlled, recently switched to nexium 40mg  BID and added carafate PRN. EGD to further evaluate - no interval changes. She denies any cardiopulmonary symptoms today and feels at baseline.   01/01/2023 3:02 PM  Taylor [name]  has presented today for endoscopic procedure(s), with the diagnosis of  Encounter Diagnosis  Name Primary?   Gastroesophageal reflux disease, unspecified whether esophagitis present Yes  .  The various methods of evaluation and treatment have been discussed with the patient and/or family. After consideration of risks, benefits and other options for treatment, the patient has consented to  the endoscopic procedure(s).   The patient's history has been reviewed, patient examined, no change in status, stable for surgery.  I have reviewed the patient's chart and labs.  Questions were answered to the patient's satisfaction.    Harlin Rain, MD [facility]

## 2023-01-01 NOTE — Progress Notes (Signed)
Uneventful anesthetic. Report to pacu rn. Vss. Care resumed by rn. 

## 2023-01-01 NOTE — Progress Notes (Signed)
Called to room to assist during endoscopic procedure.  Patient ID and intended procedure confirmed with present staff. Received instructions for my participation in the procedure from the performing physician.  

## 2023-01-02 ENCOUNTER — Telehealth: Payer: Self-pay | Admitting: *Deleted

## 2023-01-02 NOTE — Telephone Encounter (Signed)
Left message on f/u call 

## 2023-01-11 ENCOUNTER — Encounter: Payer: Self-pay | Admitting: Gastroenterology

## 2023-01-12 ENCOUNTER — Encounter: Payer: Self-pay | Admitting: Emergency Medicine

## 2023-01-19 MED ORDER — PREDNISONE 10 MG PO TABS: ORAL_TABLET | ORAL | 0 refills | Status: DC

## 2023-01-19 NOTE — Telephone Encounter (Signed)
Please call in pred: Take 40mg  daily for 3 days, then 30mg  daily for 3 days, then 20mg  daily for 3 days, then 10mg  daily for 3 days, then stop  If she is not improving we need to see her as an acute OV. If worse in any way then go to ED

## 2023-01-22 IMAGING — DX DG CHEST 2V
2 series · 2 of 2 positions shown · non-contrast
Comparison: 05/14/2018

CLINICAL DATA: Shortness of breath, fever, fatigue, 10 lb weight
gain this week.

EXAM:
CHEST - 2 VIEW

[chest pa]
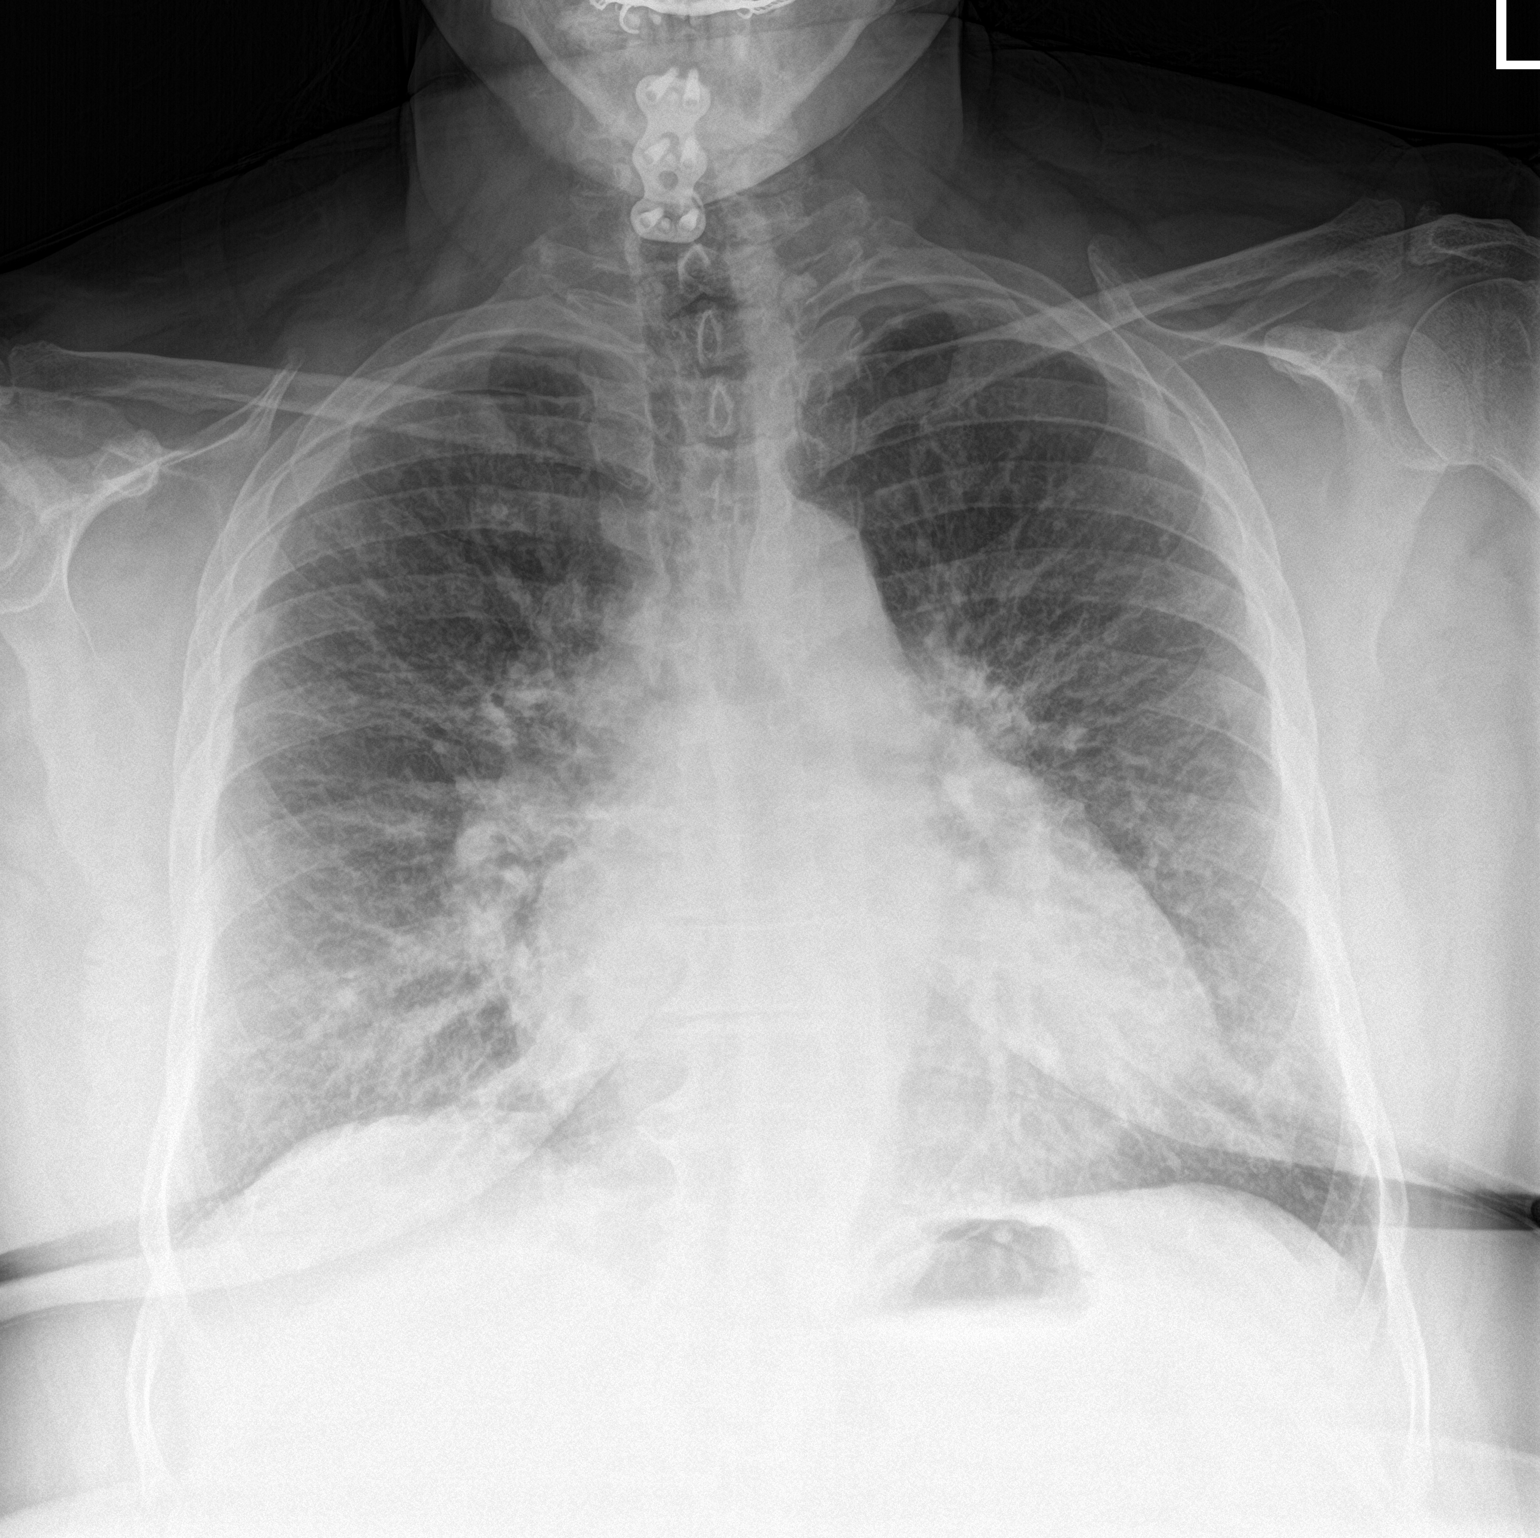

[chest lat]
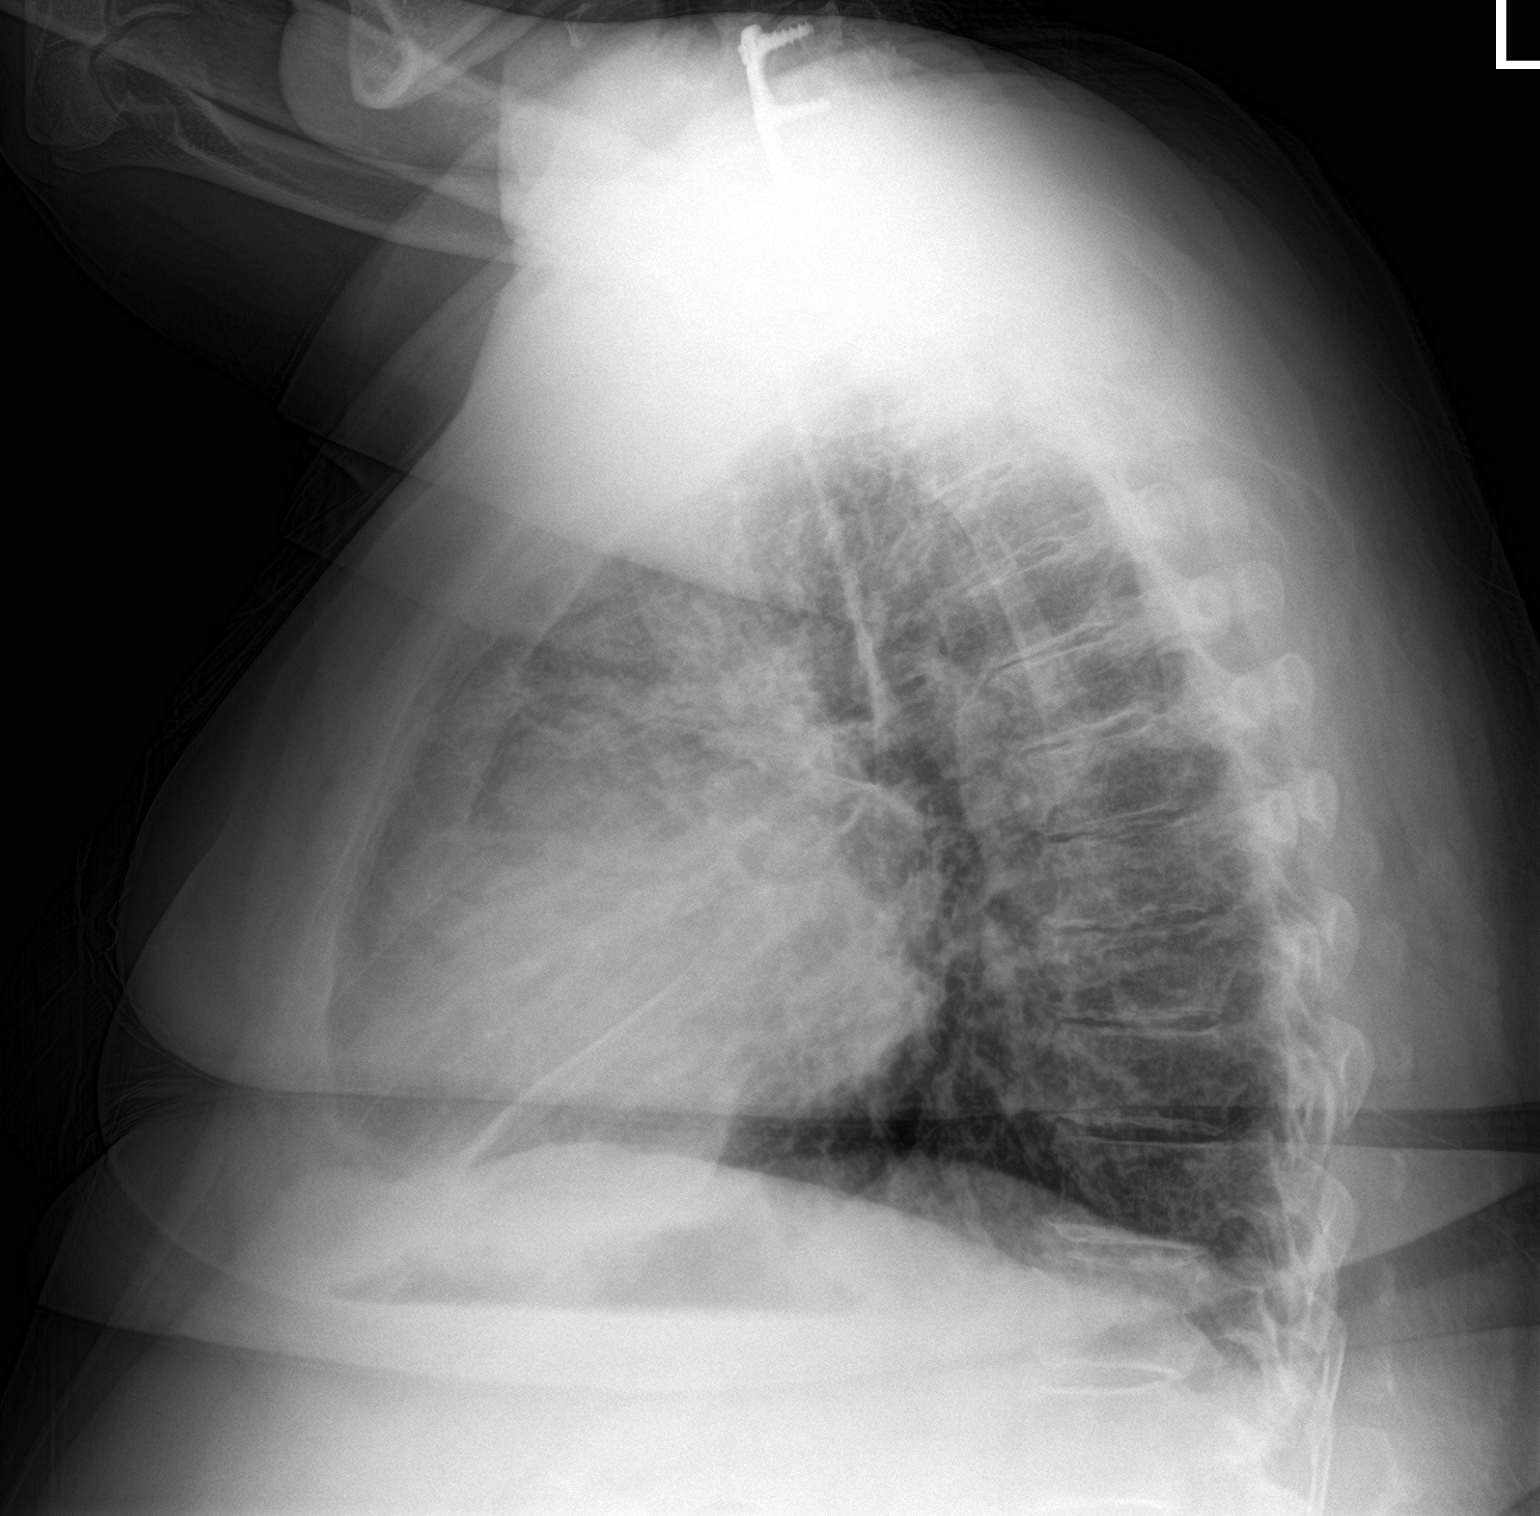

[2 of 2 positions shown; findings below may reference images not displayed]

FINDINGS: Stable enlarged cardiac silhouette and prominent pulmonary
vasculature and interstitial markings. Patchy opacity in the medial
aspect of the right upper lobe. No pleural fluid. Lower thoracic
spine degenerative changes. Cervical spine fixation hardware.
IMPRESSION: 1. Right upper lobe pneumonia.
2. Stable cardiomegaly, pulmonary vascular congestion and chronic
interstitial lung disease.

## 2023-01-29 ENCOUNTER — Ambulatory Visit: Payer: PPO | Admitting: Emergency Medicine

## 2023-01-29 ENCOUNTER — Encounter: Payer: Self-pay | Admitting: Emergency Medicine

## 2023-01-29 VITALS — BP 128/64 | HR 67 | Ht 64.0 in | Wt 229.2 lb

## 2023-01-29 DIAGNOSIS — J4489 Other specified chronic obstructive pulmonary disease: Secondary | ICD-10-CM

## 2023-01-29 DIAGNOSIS — G4733 Obstructive sleep apnea (adult) (pediatric): Secondary | ICD-10-CM | POA: Diagnosis not present

## 2023-01-29 DIAGNOSIS — R918 Other nonspecific abnormal finding of lung field: Secondary | ICD-10-CM

## 2023-01-29 NOTE — Assessment & Plan Note (Signed)
Good compliance with her CPAP.  Good clinical benefit.  Plan to continue same.

## 2023-01-29 NOTE — Assessment & Plan Note (Signed)
Waxing and waning nodular disease intermixed with scar.  Have question whether she might be aspirating.  We are planning to repeat a surveillance CT chest in January 2025.

## 2023-01-29 NOTE — Progress Notes (Signed)
Subjective:    Patient ID: Taylor [name], female    DOB: Jul 18, 1950, 72 y.o.   MRN: [id number]  HPI  ROV 07/18/2022 --72 year old woman with a history of COPD and chronic bronchitic symptoms.  Frequently treated for bronchitis/pneumonia.  Mild obstruction on her pulmonary function testing.  She is an abnormal CT scan of the chest with peribronchovascular cystic disease and some patchy upper lobe groundglass, stable on CT chest November 2023. She is on Advair, albuterol rarely unless she is acutely ill. She has intermittent reflux on PPI bid - a few times a week. Remains on singulair and zyrtec, still has congestion but does not like nasal sprays.   CT chest 07/07/2022 reviewed by me shows mild centrilobular emphysema, near complete interval resolution of her bilateral consolidations with some mild scattered residual groundglass present, stable scattered pulmonary nodules unchanged, largest 5 mm in the right middle lobe.  Decreased mediastinal and hilar lymph nodes  ROV 01/29/2023 -- Taylor [name] is 64 with a history of COPD and chronic bronchitic symptoms, OSA on CPAP.  She has frequent exacerbations and has to be treated for bronchitis versus pneumonia.  Question whether she may have occult aspiration.  Her pulmonary function testing shows mild obstruction.  I treated her for an acute flare 01/12/2023, added steroid taper to doxycycline which she had already been given.  She has some waxing waning inflammatory change and pulmonary nodular disease on CT chest intermix with chronic scar.  We are planning for repeat CT in 06/2023. We changed Advair to [facility] in February, but she preferred the Advair.  She is on Zyrtec, Singulair, sudafed, she is no longer on a PPI she sometimes forgets the advair.  Today she reports that she is improved with treatment for AE. She has some intermittent chest tightness, some increased mucous burden. Using albuterol rarely now that she is improved. She has mucous running down her  throat. She is wearing her CPAP, is having some trouble with humidity getting in the mask. Good compliance, good clinical response.    Review of Systems As per HPI     Objective:   Physical Exam  Vitals:   01/29/23 1329  BP: 128/64  Pulse: 67  SpO2: 97%  Weight: 229 lb 3.2 oz (104 kg)  Height: 5\' 4"  (1.626 m)    Gen: Pleasant, overweight woman, in no distress,  normal affect  ENT: No lesions,  mouth clear,  oropharynx clear, no postnasal drip  Neck: No JVD, no stridor  Lungs: No use of accessory muscles, coarse bilaterally, no cough.  Rhonchi but no wheezing  Cardiovascular: RRR, heart sounds normal, no murmur or gallops, no peripheral edema  Musculoskeletal: No deformities, no cyanosis or clubbing  Neuro: alert, awake, non focal  Skin: Warm, no lesions or rash     Assessment & Plan:   COPD with chronic bronchitis She did not feel any significant benefit with change from Advair to [place].  The Markus Daft was more expensive and now she is back on Advair.  Has not needed albuterol except for when she is having flaring symptoms.  She was treated for a flare at the beginning of this month.  Work to treat underlying contributors including chronic rhinitis.  She may have to go back on GERD therapy  Pulmonary nodules Waxing and waning nodular disease intermixed with scar.  Have question whether she might be aspirating.  We are planning to repeat a surveillance CT chest in January 2025.  OSA (obstructive sleep apnea) Good compliance  with her CPAP.  Good clinical benefit.  Plan to continue same.    Levy Pupa, MD, PhD 01/29/2023, 1:48 PM [facility] [phone] or if no answer before 7:00PM call [phone] For any issues after 7:00PM please call eLink [phone]

## 2023-01-29 NOTE — Assessment & Plan Note (Signed)
She did not feel any significant benefit with change from Advair to [facility].  The Markus Daft was more expensive and now she is back on Advair.  Has not needed albuterol except for when she is having flaring symptoms.  She was treated for a flare at the beginning of this month.  Work to treat underlying contributors including chronic rhinitis.  She may have to go back on GERD therapy

## 2023-01-29 NOTE — Patient Instructions (Addendum)
Please continue Advair twice a day.  Rinse and gargle after using. Keep albuterol available to use 2 puffs up to every 4 hours if needed for shortness of breath, chest tightness, wheezing.  Continue your Zyrtec and Singulair as you have been taking them Okay to use Sudafed as needed for congestion Continue to use your CPAP reliably every night We are planning to repeat your CT scan of the chest in January 2025 Please follow Dr. Delton Coombes in January after your CT.  Call sooner if you have any problems.

## 2023-02-04 ENCOUNTER — Other Ambulatory Visit: Payer: Self-pay | Admitting: Gastroenterology

## 2023-02-26 ENCOUNTER — Encounter: Payer: Self-pay | Admitting: Emergency Medicine

## 2023-02-27 NOTE — Telephone Encounter (Signed)
Doxycycline would have actually been a good choice to treat PNA if present.  I think it is reasonable to treat her with a pred taper to see if she improves.   Please have her take Pred: Take 40mg  daily for 3 days, then 30mg  daily for 3 days, then 20mg  daily for 3 days, then 10mg  daily for 3 days, then stop  Also  > azithromycin > Z-pack   Have her call us next week if not improving

## 2023-03-02 ENCOUNTER — Encounter: Payer: Self-pay | Admitting: Emergency Medicine

## 2023-03-02 MED ORDER — AZITHROMYCIN 250 MG PO TABS: ORAL_TABLET | ORAL | 0 refills | Status: DC

## 2023-03-02 MED ORDER — PREDNISONE 10 MG PO TABS: ORAL_TABLET | ORAL | 0 refills | Status: DC

## 2023-03-02 NOTE — Telephone Encounter (Signed)
Patient is checking on message for medication. Patient phone number is [phone].

## 2023-03-06 NOTE — Telephone Encounter (Signed)
Z-pak was sent in on 9/23. Nothing further needed.

## 2023-03-20 ENCOUNTER — Other Ambulatory Visit: Payer: Self-pay | Admitting: Gastroenterology

## 2023-04-13 ENCOUNTER — Ambulatory Visit: Payer: PPO | Admitting: Internal Medicine

## 2023-04-21 ENCOUNTER — Other Ambulatory Visit: Payer: Self-pay | Admitting: Emergency Medicine

## 2023-04-30 ENCOUNTER — Ambulatory Visit: Payer: PPO | Admitting: Gastroenterology

## 2023-04-30 ENCOUNTER — Encounter: Payer: Self-pay | Admitting: Gastroenterology

## 2023-04-30 VITALS — BP 140/78 | HR 60 | Ht 65.0 in | Wt 232.0 lb

## 2023-04-30 DIAGNOSIS — Z860100 Personal history of colon polyps, unspecified: Secondary | ICD-10-CM | POA: Diagnosis not present

## 2023-04-30 DIAGNOSIS — K219 Gastro-esophageal reflux disease without esophagitis: Secondary | ICD-10-CM | POA: Diagnosis not present

## 2023-04-30 DIAGNOSIS — Z79899 Other long term (current) drug therapy: Secondary | ICD-10-CM | POA: Diagnosis not present

## 2023-04-30 MED ORDER — SUTAB 1479-225-188 MG PO TABS
24.0000 | ORAL_TABLET | Freq: Once | ORAL | 0 refills | Status: AC
Start: 2023-04-30 — End: 2023-04-30

## 2023-04-30 MED ORDER — BACLOFEN 10 MG PO TABS
10.0000 mg | ORAL_TABLET | Freq: Three times a day (TID) | ORAL | 1 refills | Status: DC | PRN
Start: 2023-04-30 — End: 2023-05-29

## 2023-04-30 MED ORDER — VOQUEZNA 10 MG PO TABS: 10.0000 mg | ORAL_TABLET | Freq: Every day | ORAL | 0 refills | Status: DC

## 2023-04-30 MED ORDER — NA SULFATE-K SULFATE-MG SULF 17.5-3.13-1.6 GM/177ML PO SOLN
1.0000 | Freq: Once | ORAL | 0 refills | Status: AC
Start: 2023-04-30 — End: 2023-04-30

## 2023-04-30 NOTE — Progress Notes (Signed)
HPI :  72 year old female here for a follow-up visit for GERD, history of colon polyps.  I last saw her in July 2024.  Recall she has severe GERD, history of recurrent pneumonia, thought to have component of COPD with chronic bronchitis and potential component of asthma, followed by pulmonary.  They have question whether or not reflux is contributing to her respiratory issues and if she is potentially having aspiration.  Recall she has had reflux for years, has been on a variety of PPIs to include omeprazole, Protonix, Nexium.  We did an EGD in July, she had no hiatal hernia or Barrett's esophagus.  We had discussed weight loss could help with this, she is not a candidate for TIF or fundoplication given her BMI (38).   Since have last seen her she has struggled with weight and think she has gained a little bit of weight since have last seen her.  We had tried transitioning her Protonix to Nexium twice daily but states the Protonix actually worked better than the Nexium, and transition back to Nexium 40 mg twice daily.  She is also using MiraLAX as needed for breakthrough.  She states the Maalox has worked better than Carafate.  Her typical symptoms are pyrosis and regurgitation, often after she eats.  She also has cough that bothers her in the morning.  She had another episode of pneumonia in August, was diagnosed with COVID in September.  She sleeps with the head of her bed elevated.  She did have a DEXA scan in 2023 that was normal.  We discussed other options for management of reflux.  She does take Zanaflex for muscle spasm, we discussed option of baclofen.  We also discussed other medical therapies.  She is overdue for surveillance colonoscopy. She had a colonoscopy in February 2021 in [place].  She had numerous polyps removed and told she needed another exam in 3 years.  She denies any problems with her bowels currently.  No blood in her stools.  No family history of colon cancer.  She feels  her respiratory status is stable.  She had an echocardiogram in April of this year with an EF of 60 to 65%.  She is not on any supplemental oxygen.  Prior workup: Echo 10/03/22: EF 60-65%, otherwise normal   CT abdomen / pelvis 07/15/2008: IMPRESSION:  Focal inflammation of the colon at the level of the hepatic  flexure.  Differential considerations include right-sided  diverticulitis or focal colitis.  Tumor is felt much less likely  based on CT appearance.  No evidence of perforation, abscess or  bowel obstruction.    Colonoscopy 07/11/19: SURGICAL PATHOLOGY REPORT  FINAL PATHOLOGIC DIAGNOSIS  MICROSCOPIC EXAMINATION AND DIAGNOSIS  COLON POLYPS, POLYPECTOMY:      Sessile serrated polyp.      Tubular adenoma.      Hyperplastic polyp.       EGD 01/01/23: - Esophagogastric landmarks were identified: the Z-line was found at 37 cm, the gastroesophageal junction was found at 37 cm and the upper extent of the gastric folds was found at 37 cm from the incisors. No hiatal hernia. Findings: - The exam of the esophagus was otherwise normal. No erosive changes. - Biopsies were obtained from the proximal and distal esophagus with cold forceps to rule out eosinophilic esophagitis. - Numerous / multiple small sessile and flat polyps were found in the gastric fundus and in the gastric body, hyperplastic appearing. Biopsies were taken with a cold forceps from a few  of them for histology. - The exam of the stomach was otherwise normal. - The examined duodenum was normal.  No erosive changes or significant hiatal hernia. Patient has nonerosive reflux disease. Will see how she responds to change in regimen. Would benefit from weight loss as discussed at office visit  1. Surgical [P], gastric polyps - HYPERPLASTIC POLYPS 2. Surgical [P], mid esophagus and distal esophagus - SQUAMOUS MUCOSA WITH NO SPECIFIC PATHOLOGIC CHANGES - NEGATIVE FOR INCREASED INTRAEPITHELIAL EOSINOPHILS - NEGATIVE FOR DYSPLASIA OR  MALIGNAN   Past Medical History:  Diagnosis Date   Anxiety    Arthritis    "hands, neck, back" (07/22/2013)   Asthma    Atrial fibrillation (HCC)    CHEST DISCOMFORT    CHF (congestive heart failure) (HCC)    Chronic back pain    "started in lower back; getting to be all over my back" (07/22/2013)   Chronic bronchitis (HCC)    "I've had it 2 years in a row; do have some trouble breathing at times; use an inhaler prn; not asthma" (07/22/2013)   Chronic neck pain    Colon polyps    COPD (chronic obstructive pulmonary disease) (HCC)    DDD (degenerative disc disease), cervical    DDD (degenerative disc disease), lumbar    Depression    Dysrhythmia    GERD (gastroesophageal reflux disease)    Hepatitis 1971   "don't know if it was A or B" (07/22/2013)   HYPERLIPIDEMIA-MIXED 2005   "went away after I started taking fish oil" (07/22/2013)   Hypothyroidism    Palpitations    Pneumonia 2013   Sleep apnea treated with continuous positive airway pressure (CPAP)    Type II diabetes mellitus (HCC)    "diet controlled" (07/22/2013)     Past Surgical History:  Procedure Laterality Date   ANTERIOR CERVICAL DECOMP/DISCECTOMY FUSION  ?2003   "w/plating" (07/22/2013)   ATRIAL FIBRILLATION ABLATION N/A 09/21/2019   Procedure: ATRIAL FIBRILLATION ABLATION;  Surgeon: Regan Lemming, MD;  Location: [facility];  Service: Cardiovascular;  Laterality: N/A;   BUNIONECTOMY Left 06/10/2011   DILATION AND CURETTAGE OF UTERUS  06/09/1974   PLANTAR FASCIA SURGERY Right 06/09/2009   WISDOM TOOTH EXTRACTION     Family History  Problem Relation Age of Onset   Hyperlipidemia Mother    Brain cancer Father    Atrial fibrillation Brother    Diabetes Maternal Grandfather    Heart attack Paternal Grandfather    Heart disease Other    Hypertension Son    Social History   Tobacco Use   Smoking status: Former    Current packs/day: 0.00    Average packs/day: 1 pack/day for 40.0 years (40.0  ttl pk-yrs)    Types: Cigarettes    Start date: 12/22/1967    Quit date: 12/22/2007    Years since quitting: 15.3   Smokeless tobacco: Never  Vaping Use   Vaping status: Never Used  Substance Use Topics   Alcohol use: Not Currently    Comment: 07/22/2013 "last drink was in ~ 2005"   Drug use: No   Current Outpatient Medications  Medication Sig Dispense Refill   acetaminophen (TYLENOL) 500 MG tablet Take 1,000 mg by mouth every 6 (six) hours as needed for moderate pain or headache.      albuterol (PROVENTIL) (2.5 MG/3ML) 0.083% nebulizer solution Take 3 mLs (2.5 mg total) by nebulization every 4 (four) hours as needed for wheezing or shortness of breath. 75 mL 2  albuterol (VENTOLIN HFA) 108 (90 Base) MCG/ACT inhaler Inhale 1 puff into the lungs every 6 (six) hours as needed for wheezing or shortness of breath. 1 each 1   Alpha-Lipoic Acid 100 MG CAPS Take 1 capsule by mouth daily.     Ascorbic Acid (VITAMIN C) 1000 MG tablet Take 1,000 mg by mouth daily.     aspirin EC 81 MG tablet Take 81 mg by mouth daily.     Biotin 1 MG CAPS Take by mouth.     buPROPion (WELLBUTRIN XL) 150 MG 24 hr tablet Take 150 mg by mouth 2 (two) times daily.     cetirizine (ZYRTEC) 10 MG tablet Take 10 mg by mouth daily.      Cinnamon 500 MG TABS Take 1,000 mg by mouth daily.     Coenzyme Q10 (COQ10) 200 MG CAPS Take by mouth.     ESTRADIOL TD Apply 1 mL topically daily. Estradiol 7.5mg /gm cream ( apply to thighs)     FLUoxetine (PROZAC) 20 MG tablet Take 40 mg by mouth daily.     fluticasone-salmeterol (ADVAIR) 500-50 MCG/ACT AEPB Inhale 1 puff into the lungs in the morning and at bedtime. 180 each 3   furosemide (LASIX) 80 MG tablet Take 1 tablet (80 mg total) by mouth daily. 90 tablet 2   glucosamine-chondroitin 500-400 MG tablet Take 1 tablet by mouth 3 (three) times daily.     Guaifenesin 1200 MG TB12 Take 1,200 mg by mouth daily.     hydrOXYzine (ATARAX/VISTARIL) 50 MG tablet Take 50 mg by mouth daily as  needed for itching.      ketoconazole (NIZORAL) 2 % cream Apply 1 application  topically daily.     metoprolol tartrate (LOPRESSOR) 25 MG tablet Take 1 tablet (25 mg total) by mouth 2 (two) times daily. 180 tablet 2   montelukast (SINGULAIR) 10 MG tablet Take 10 mg by mouth at bedtime.     OIL OF OREGANO PO Take 1-2 capsules by mouth daily as needed (cold symptoms).      Omega-3 Fatty Acids (FISH OIL) 1000 MG CAPS Take by mouth.     OSHA PO Take by mouth.     OVER THE COUNTER MEDICATION Take 2 tablets by mouth daily at 6 (six) AM. SACROMYCES bouliardi (probiotic)     Oxycodone HCl 10 MG TABS Take 10 mg by mouth in the morning, at noon, in the evening, and at bedtime.     pantoprazole (PROTONIX) 40 MG tablet Take 1 tablet (40 mg total) by mouth 2 (two) times daily. 60 tablet 0   potassium chloride (KLOR-CON M10) 10 MEQ tablet Take 1 tablet (10 mEq total) by mouth daily. 90 tablet 3   pregabalin (LYRICA) 100 MG capsule Take 100 mg by mouth 3 (three) times daily.     progesterone (PROMETRIUM) 100 MG capsule Take 300 mg by mouth every evening.      pseudoephedrine (SUDAFED) 120 MG 12 hr tablet Take 120 mg by mouth daily.     TESTOSTERONE TD Place 1-2 application onto the skin See admin instructions. Testosterone 2% cream. Apply 1 click's worth of testosterone cream on Sun, Tues, Thurs, and Sat. Apply 2 click's worth on Mon, Wed, and Fri     thyroid (ARMOUR) 60 MG tablet Take 30-120 mg by mouth See admin instructions. Take 120 mg in the morning and 30 mg in the afternoon     tiZANidine (ZANAFLEX) 4 MG tablet Take 2-4 mg by mouth 4 (four) times daily  as needed for muscle spasms. 2 mg 3 times daily - 4 mg at bedtime     TURMERIC PO Take 1,440 mg by mouth every evening.     Vitamin D-Vitamin K (VITAMIN K2-VITAMIN D3 PO) Take 1 capsule by mouth every evening.     Zinc 20 MG CAPS Take by mouth.     No current facility-administered medications for this visit.   Allergies  Allergen Reactions   Avelox  [Moxifloxacin Hcl In Nacl] Hives   Linezolid Rash   Venlafaxine Nausea Only, Rash and Nausea And Vomiting   Rosuvastatin Other (See Comments)    myalgias   Lovastatin    Cephalexin Itching, Rash and Dermatitis   Nitrofurantoin Itching, Rash and Dermatitis   Other Rash    Brewer yeast and Bakers yeast causes cracks in mouth stomach bloating    Penicillins Itching and Rash    Has patient had a PCN reaction causing anaphylaxis, immediate rash, facial/tongue/throat swelling, SOB or lightheadedness with hypotension? no  Has patient had a PCN reaction causing severe rash involving mucus membranes or skin necrosis?  no  Has patient had a PCN reaction that required hospitilization?no  Has patient had a PCN reaction occurring within the last 10 years?  no  If all of the above answers are "no" then may proceed with cephalosporin use.  Has patient had a PCN reaction causing anaphylaxis, immediate rash, facial/tongue/throat swelling, SOB or lightheadedness with hypotension? no, Has patient had a PCN reaction causing severe rash involving mucus membranes or skin necrosis?  no, Has patient had a PCN reaction that required hospitilization?no, , Has patient had a PCN reaction occurring within the last 10 years?  no, If all of the above answers are "no" then may proceed with cephalosporin use.   Statins Other (See Comments)    Muscle pain  Other Reaction(s): Other (See Comments)     Review of Systems: All systems reviewed and negative except where noted in HPI.    Physical Exam: BP (!) 140/78   Pulse 60   Ht 5\' 5"  (1.651 m)   Wt 232 lb (105.2 kg)   BMI 38.61 kg/m  Constitutional: Pleasant,well-developed, female in no acute distress. Neurological: Alert and oriented to person place and time. Psychiatric: Normal mood and affect. Behavior is normal.   ASSESSMENT: 72 y.o. female here for assessment of the following  1. Gastroesophageal reflux disease, unspecified whether esophagitis  present   2. Long-term current use of proton pump inhibitor therapy   3. History of colon polyps    Longstanding GERD, she is on high-dose Protonix with some breakthrough that bothers her, using Maalox as needed.  We tried switching her PPI, she failed omeprazole, Nexium did not provide any better relief and she felt Protonix has worked the best so far for her.  She does have some cough and history of aspiration, question related to reflux.  She has no major hiatal hernia or Barrett's esophagus which is good, however symptoms are poorly controlled on her current regimen.  We discussed other options to include TIF or surgery, unfortunately she is not a good candidate for those with her BMI.  I do think weight loss would be helpful to reduce her symptoms moving forward and we discussed that, she will work on it.  In the interim we discussed other options.  She does take Zanaflex for occasional muscle spasm.  I offered her the option of baclofen as an alternative reflux regimen.  We discussed this for a bit,  risks of drowsiness namely.  Recommend she try baclofen 10 to 20 mg nightly and can titrate up to every 8 hours during the day if she tolerates it.  She should hold Zanaflex if she is taking the baclofen.  She would like to try this.  Otherwise discussed other antacids.  I had a free sample of Voquezna available and offered her trial of this for 10 days, dose to 10 mg daily.  She will do this and hold Protonix for the next 10 days and see how she likes it.  If it provides additional relief then we can try to get her a prescription for it if her insurance covers it.  Otherwise if not, continue Protonix as currently dosed.  We did discuss long-term risks of chronic PPI use and she understands this.  DEXA scan is normal.  Counseled her she needs to take Protonix 30 to 60 minutes before meals, she is currently taking it with meals.  Otherwise she is due for surveillance colonoscopy.  No bowel symptoms that  bother her.  Respiratory status appears stable to undergo anesthesia.  Discussed risks and benefits of the procedure and anesthesia and she wants to proceed, further recommendations pending results.   PLAN: - trial of baclofen 10mg  to 20mg  q HS or every 8 hours PRN. Hold other muscle relaxer if using this - trial of Voquezna 10mg  / day for 10 days - will do script if it helps. Hold protonix while on Voquezna and let us know if she wants a prescription - otherwise if continuing protonix she needs to take it 30-60 minutes prior to meal - not a candidate for reflux surgery right now due to BMI - discussed long term risks of chronic PPI, she understands - schedule colonoscopy at the [facility]  Harlin Rain, MD [facility]

## 2023-04-30 NOTE — Patient Instructions (Addendum)
You have been scheduled for a colonoscopy. Please follow written instructions given to you at your visit today.   Please pick up your prep supplies at the pharmacy within the next 1-3 days.  If you use inhalers (even only as needed), please bring them with you on the day of your procedure.  DO NOT TAKE 7 DAYS PRIOR TO TEST- Trulicity (dulaglutide) Ozempic, Wegovy (semaglutide) Mounjaro (tirzepatide) Bydureon Bcise (exanatide extended release)  DO NOT TAKE 1 DAY PRIOR TO YOUR TEST Rybelsus (semaglutide) Adlyxin (lixisenatide) Victoza (liraglutide) Byetta (exanatide) ___________________________________________________________________________  Baclofen 10mg : Take 1 to 2 tablets daily at bedtime (or take every 8 hours as needed). Do not take Zanaflex if when taking Baclofen)  We have given you samples of the following medication to take: Voquezna 10 mg: Take once daily  (Hold Protonix while trying Voquezna).  If you resume Protonix, please take 30 to 60 minutes before a meal  Thank you for entrusting me with your care and for choosing [facility], Dr. Ileene Patrick

## 2023-05-01 ENCOUNTER — Encounter: Payer: Self-pay | Admitting: Gastroenterology

## 2023-05-03 NOTE — Progress Notes (Unsigned)
Cardiology Office Note   Date:  05/04/2023   ID:  [name], [name] [birth year], MRN [id number]  PCP:  [place], Julie M, NP  Cardiologist:   Dietrich Pates, MD   F/U of PAF    History of Present Illness: Taylor [name] is a 72 y.o. female with a history of OSA, COPD, DM hypothyroidism, GERD and PA Patient had afib initially in 2009, around time of URI.  Had intermitt after. Placed on flecanide 100 bid   In 2021 she had recurrent palpitations / afib and was seen by Carleene Mains.  She underwent ablation of afib in early 2021 (CT prior to ablation showed  Ca score of 1)     After this her flecanide was stopped   With no recurrence of afib, Xarelto was also disconfinued   I saw the pt in Dec 2023  She had been admitted for rep failure  with multfocal pneumonia   Was in afib on admit with RVR  Converted to SR After I saw her pt wore monitor in Jan 2024  This  showed SR   No  Afib     Since seen she says her breathing is OK   She Denies CP   Notes slight  LE edema    No palpitations   I saw the pt in Spring 2024   She was seen by Lytle [place]te after   Started on pravastatin and recomm she continue Zetia  Since I saw her she had COVID thenpneumonia   Breathing is not good  She has gained weight     No palpitations     Some ankle edema   BP 120s/ 60s    Current Meds  Medication Sig   acetaminophen (TYLENOL) 500 MG tablet Take 1,000 mg by mouth every 6 (six) hours as needed for moderate pain or headache.    albuterol (PROVENTIL) (2.5 MG/3ML) 0.083% nebulizer solution Take 3 mLs (2.5 mg total) by nebulization every 4 (four) hours as needed for wheezing or shortness of breath.   albuterol (VENTOLIN HFA) 108 (90 Base) MCG/ACT inhaler Inhale 1 puff into the lungs every 6 (six) hours as needed for wheezing or shortness of breath.   Alpha-Lipoic Acid 100 MG CAPS Take 1 capsule by mouth daily.   Ascorbic Acid (VITAMIN C) 1000 MG tablet Take 1,000 mg by mouth daily.   aspirin EC 81 MG tablet Take 81 mg by  mouth daily.   baclofen (LIORESAL) 10 MG tablet Take 1-2 tablets (10-20 mg total) by mouth every 8 (eight) hours as needed for muscle spasms. Take daily at bedtime or every 8 hours as needed   Biotin 1 MG CAPS Take by mouth.   buPROPion (WELLBUTRIN XL) 150 MG 24 hr tablet Take 150 mg by mouth 2 (two) times daily.   cetirizine (ZYRTEC) 10 MG tablet Take 10 mg by mouth daily.    Cinnamon 500 MG TABS Take 1,000 mg by mouth daily.   Coenzyme Q10 (COQ10) 200 MG CAPS Take by mouth.   ESTRADIOL TD Apply 1 mL topically daily. Estradiol 7.5mg /gm cream ( apply to thighs)   FLUoxetine (PROZAC) 20 MG tablet Take 40 mg by mouth daily.   fluticasone-salmeterol (ADVAIR) 500-50 MCG/ACT AEPB Inhale 1 puff into the lungs in the morning and at bedtime.   furosemide (LASIX) 80 MG tablet Take 1 tablet (80 mg total) by mouth daily.   glucosamine-chondroitin 500-400 MG tablet Take 1 tablet by mouth 3 (three) times daily.  Guaifenesin 1200 MG TB12 Take 1,200 mg by mouth daily.   hydrOXYzine (ATARAX/VISTARIL) 50 MG tablet Take 50 mg by mouth daily as needed for itching.    ketoconazole (NIZORAL) 2 % cream Apply 1 application  topically daily.   metoprolol tartrate (LOPRESSOR) 25 MG tablet Take 1 tablet (25 mg total) by mouth 2 (two) times daily.   montelukast (SINGULAIR) 10 MG tablet Take 10 mg by mouth at bedtime.   OIL OF OREGANO PO Take 1-2 capsules by mouth daily as needed (cold symptoms).    Omega-3 Fatty Acids (FISH OIL) 1000 MG CAPS Take by mouth.   OSHA PO Take by mouth.   OVER THE COUNTER MEDICATION Take 2 tablets by mouth daily at 6 (six) AM. SACROMYCES bouliardi (probiotic)   Oxycodone HCl 10 MG TABS Take 10 mg by mouth in the morning, at noon, in the evening, and at bedtime.   pantoprazole (PROTONIX) 40 MG tablet Take 1 tablet (40 mg total) by mouth 2 (two) times daily.   potassium chloride (KLOR-CON M10) 10 MEQ tablet Take 1 tablet (10 mEq total) by mouth daily.   pregabalin (LYRICA) 100 MG capsule Take  100 mg by mouth 3 (three) times daily.   progesterone (PROMETRIUM) 100 MG capsule Take 300 mg by mouth every evening.    pseudoephedrine (SUDAFED) 120 MG 12 hr tablet Take 120 mg by mouth daily.   SUTAB [phone] MG TABS as directed. colonoscopy   TESTOSTERONE TD Place 1-2 application onto the skin See admin instructions. Testosterone 2% cream. Apply 1 click's worth of testosterone cream on Sun, Tues, Thurs, and Sat. Apply 2 click's worth on Mon, Wed, and Fri   thyroid (ARMOUR) 60 MG tablet Take 30-120 mg by mouth See admin instructions. Take 120 mg in the morning and 30 mg in the afternoon   tiZANidine (ZANAFLEX) 4 MG tablet Take 2-4 mg by mouth 4 (four) times daily as needed for muscle spasms. 2 mg 3 times daily - 4 mg at bedtime   TURMERIC PO Take 1,440 mg by mouth every evening.   Vitamin D-Vitamin K (VITAMIN K2-VITAMIN D3 PO) Take 1 capsule by mouth every evening.   Vonoprazan Fumarate (VOQUEZNA) 10 MG TABS Take 10 mg by mouth daily. [id number], exp: 08-2024   ZETIA 10 MG tablet daily.   Zinc 20 MG CAPS Take by mouth.     Allergies:   Avelox [moxifloxacin hcl in nacl], Linezolid, Venlafaxine, Rosuvastatin, Lovastatin, Cephalexin, Nitrofurantoin, Other, Penicillins, and Statins   Past Medical History:  Diagnosis Date   Anxiety    Arthritis    "hands, neck, back" (07/22/2013)   Asthma    Atrial fibrillation (HCC)    CHEST DISCOMFORT    CHF (congestive heart failure) (HCC)    Chronic back pain    "started in lower back; getting to be all over my back" (07/22/2013)   Chronic bronchitis (HCC)    "I've had it 2 years in a row; do have some trouble breathing at times; use an inhaler prn; not asthma" (07/22/2013)   Chronic neck pain    Colon polyps    COPD (chronic obstructive pulmonary disease) (HCC)    DDD (degenerative disc disease), cervical    DDD (degenerative disc disease), lumbar    Depression    Dysrhythmia    GERD (gastroesophageal reflux disease)    Hepatitis 1971   "don't  know if it was A or B" (07/22/2013)   HYPERLIPIDEMIA-MIXED 2005   "went away after I started taking fish  oil" (07/22/2013)   Hypothyroidism    Palpitations    Pneumonia 2013   Sleep apnea treated with continuous positive airway pressure (CPAP)    Type II diabetes mellitus (HCC)    "diet controlled" (07/22/2013)    Past Surgical History:  Procedure Laterality Date   ANTERIOR CERVICAL DECOMP/DISCECTOMY FUSION  ?2003   "w/plating" (07/22/2013)   ATRIAL FIBRILLATION ABLATION N/A 09/21/2019   Procedure: ATRIAL FIBRILLATION ABLATION;  Surgeon: Regan Lemming, MD;  Location: [facility];  Service: Cardiovascular;  Laterality: N/A;   BUNIONECTOMY Left 06/10/2011   DILATION AND CURETTAGE OF UTERUS  06/09/1974   PLANTAR FASCIA SURGERY Right 06/09/2009   WISDOM TOOTH EXTRACTION       Social History:  The patient  reports that she quit smoking about 15 years ago. Her smoking use included cigarettes. She started smoking about 55 years ago. She has a 40 pack-year smoking history. She has never used smokeless tobacco. She reports that she does not currently use alcohol. She reports that she does not use drugs.   Family History:  The patient's family history includes Atrial fibrillation in her brother; Brain cancer in her father; Diabetes in her maternal grandfather; Heart attack in her paternal grandfather; Heart disease in an other family member; Hyperlipidemia in her mother; Hypertension in her son.    ROS:  Please see the history of present illness. All other systems are reviewed and  Negative to the above problem except as noted.    PHYSICAL EXAM: VS:  BP (!) 142/73   Pulse 68   Ht 5\' 5"  (1.651 m)   Wt 243 lb (110.2 kg)   SpO2 97%   BMI 40.44 kg/m   Gen  Obese 72 yo in no acute distress  HEENT: normal  Neck: JVP normal , No carotid bruits Cardiac: RRR  Distant HS  Tr  LE edema  Respiratory:  VERY course BS, rhonchi  with some wheezes   GI: soft, nontender  No hepatomegaly     EKG:  EKG shows SR 68  Septal MI   68 bpm    Monitor Jan 2024  Normal SR     Echo   April 2024    1. Left ventricular ejection fraction, by estimation, is 60 to 65%. The  left ventricle has normal function. The left ventricle has no regional  wall motion abnormalities. Left ventricular diastolic parameters were  normal. The average left ventricular  global longitudinal strain is -19.2 %. The global longitudinal strain is  normal.   2. Right ventricular systolic function is normal. The right ventricular  size is normal. Tricuspid regurgitation signal is inadequate for assessing  PA pressure.   3. The mitral valve is normal in structure. No evidence of mitral valve  regurgitation. No evidence of mitral stenosis.   4. The aortic valve is tricuspid. Aortic valve regurgitation is not  visualized. No aortic stenosis is present.   5. The inferior vena cava is normal in size with greater than 50%  respiratory variability, suggesting right atrial pressure of 3 mmHg.   CCTA 2021  1. There is normal pulmonary vein drainage into the left atrium with ostial measurements above.   2. There is no thrombus in the left atrial appendage.   3. The esophagus runs in the left atrial midline and is not in the proximity to any of the pulmonary veins.   4. No PFO/ASD.   5. Normal coronary origin. Right dominance.   6. CAC score of  1 which is 50th percentile for age- and sex-matched controls. No CAD in the proximal segments.   Lipid Panel    Component Value Date/Time   CHOL 192 05/07/2022 1217   TRIG 75 05/07/2022 1217   HDL 59 05/07/2022 1217   CHOLHDL 3.3 05/07/2022 1217   CHOLHDL 4.2 CALC 11/17/2007 0844   VLDL 18 11/17/2007 0844   LDLCALC 119 (H) 05/07/2022 1217      Wt Readings from Last 3 Encounters:  05/04/23 243 lb (110.2 kg)  04/30/23 232 lb (105.2 kg)  01/29/23 229 lb 3.2 oz (104 kg)      ASSESSMENT AND PLAN:  1  PAF Pt is s/p ablation  She is off of  antiarrhythmics and antioagualation Had one recurrence when sick with pneumonia Converted to SR on own   She wore a monitor in Jan 2024 showed SR   Denies palpitations  She remains in SR   Follow    2  Hx of HFpEF   PT with normal LVEF   Gr II disatolic dysfunciton   Last echo diastolic function was normal    Today Volume overall appears OK    3  Pulmonary  Pt with significant congestion today  Follows in pulmonary  has a flutter valve  I recomm she use it    4  CAD  Pt with Ca score of 1    No symptoms of angina   Risk modify    5 HTN  Pt says her BP is better at home   120s to low 130s   Follow  Bring cuff to clinic   6  PAD  Pt with mild plaquing on CT   Control risk factors   7  HL  Pt with dyslipidemia  Did not tolerate statins   LDL was 113  Will try Nexlizet   Follow up lipomed and liver panel in 8 wks       Current medicines are reviewed at length with the patient today.  The patient does not have concerns regarding medicines.  Signed, Dietrich Pates, MD  05/04/2023 3:07 PM    [facility] [address] [place], [place], [place]  [address] Phone: [phone]; Fax: [phone]

## 2023-05-04 ENCOUNTER — Ambulatory Visit: Payer: PPO | Attending: Internal Medicine | Admitting: Internal Medicine

## 2023-05-04 ENCOUNTER — Other Ambulatory Visit: Payer: Self-pay

## 2023-05-04 ENCOUNTER — Encounter: Payer: Self-pay | Admitting: Internal Medicine

## 2023-05-04 VITALS — BP 142/73 | HR 68 | Ht 65.0 in | Wt 243.0 lb

## 2023-05-04 DIAGNOSIS — I480 Paroxysmal atrial fibrillation: Secondary | ICD-10-CM

## 2023-05-04 DIAGNOSIS — I5040 Unspecified combined systolic (congestive) and diastolic (congestive) heart failure: Secondary | ICD-10-CM | POA: Diagnosis not present

## 2023-05-04 DIAGNOSIS — E782 Mixed hyperlipidemia: Secondary | ICD-10-CM

## 2023-05-04 DIAGNOSIS — I739 Peripheral vascular disease, unspecified: Secondary | ICD-10-CM | POA: Diagnosis not present

## 2023-05-04 DIAGNOSIS — I10 Essential (primary) hypertension: Secondary | ICD-10-CM

## 2023-05-04 DIAGNOSIS — D124 Benign neoplasm of descending colon: Secondary | ICD-10-CM | POA: Diagnosis not present

## 2023-05-04 DIAGNOSIS — D123 Benign neoplasm of transverse colon: Secondary | ICD-10-CM | POA: Diagnosis not present

## 2023-05-04 DIAGNOSIS — K635 Polyp of colon: Secondary | ICD-10-CM | POA: Diagnosis not present

## 2023-05-04 MED ORDER — NEXLIZET 180-10 MG PO TABS: 1.0000 | ORAL_TABLET | Freq: Every day | ORAL | Status: DC

## 2023-05-04 MED ORDER — NEXLIZET 180-10 MG PO TABS: 1.0000 | ORAL_TABLET | Freq: Every day | ORAL | 3 refills | Status: DC

## 2023-05-04 NOTE — Patient Instructions (Signed)
Medication Instructions:  Nexlizet 180 mg a day  *If you need a refill on your cardiac medications before your next appointment, please call your pharmacy*   Lab Work: Nmr, hepatic in 8 weeks can just walk in... mark your calendar... no appointment needed/ any [facility] or our office  If you have labs (blood work) drawn today and your tests are completely normal, you will receive your results only by: MyChart Message (if you have MyChart) OR A paper copy in the mail If you have any lab test that is abnormal or we need to change your treatment, we will call you to review the results.   Testing/Procedures:    Follow-Up: At [facility], you and your health needs are our priority.  As part of our continuing mission to provide you with exceptional heart care, we have created designated Provider Care Teams.  These Care Teams include your primary Cardiologist (physician) and Advanced Practice Providers (APPs -  Physician Assistants and Nurse Practitioners) who all work together to provide you with the care you need, when you need it.  We recommend signing up for the patient portal called "MyChart".  Sign up information is provided on this After Visit Summary.  MyChart is used to connect with patients for Virtual Visits (Telemedicine).  Patients are able to view lab/test results, encounter notes, upcoming appointments, etc.  Non-urgent messages can be sent to your provider as well.   To learn more about what you can do with MyChart, go to [email or web address].

## 2023-05-05 ENCOUNTER — Encounter: Payer: Self-pay | Admitting: Gastroenterology

## 2023-05-05 ENCOUNTER — Ambulatory Visit: Payer: PPO | Admitting: Gastroenterology

## 2023-05-05 VITALS — BP 114/60 | HR 57 | Temp 97.7°F | Resp 18 | Ht 65.0 in | Wt 243.0 lb

## 2023-05-05 DIAGNOSIS — Z1211 Encounter for screening for malignant neoplasm of colon: Secondary | ICD-10-CM

## 2023-05-05 DIAGNOSIS — D124 Benign neoplasm of descending colon: Secondary | ICD-10-CM | POA: Diagnosis not present

## 2023-05-05 DIAGNOSIS — Z860101 Personal history of adenomatous and serrated colon polyps: Secondary | ICD-10-CM

## 2023-05-05 DIAGNOSIS — K648 Other hemorrhoids: Secondary | ICD-10-CM

## 2023-05-05 DIAGNOSIS — D123 Benign neoplasm of transverse colon: Secondary | ICD-10-CM

## 2023-05-05 DIAGNOSIS — D122 Benign neoplasm of ascending colon: Secondary | ICD-10-CM | POA: Diagnosis not present

## 2023-05-05 DIAGNOSIS — K5730 Diverticulosis of large intestine without perforation or abscess without bleeding: Secondary | ICD-10-CM

## 2023-05-05 DIAGNOSIS — Z860100 Personal history of colon polyps, unspecified: Secondary | ICD-10-CM

## 2023-05-05 MED ORDER — SODIUM CHLORIDE 0.9 % IV SOLN: 500.0000 mL | INTRAVENOUS | Status: DC

## 2023-05-05 NOTE — Progress Notes (Signed)
Sedate, gd SR, tolerated procedure well, VSS, report to RN 

## 2023-05-05 NOTE — Patient Instructions (Signed)
Resume previous diet and medications. Awaiting pathology results. Repeat Colonoscopy date to be determined based on pathology results.  YOU HAD AN ENDOSCOPIC PROCEDURE TODAY AT THE [facility]:   Refer to the procedure report that was given to you for any specific questions about what was found during the examination.  If the procedure report does not answer your questions, please call your gastroenterologist to clarify.  If you requested that your care partner not be given the details of your procedure findings, then the procedure report has been included in a sealed envelope for you to review at your convenience later.  YOU SHOULD EXPECT: Some feelings of bloating in the abdomen. Passage of more gas than usual.  Walking can help get rid of the air that was put into your GI tract during the procedure and reduce the bloating. If you had a lower endoscopy (such as a colonoscopy or flexible sigmoidoscopy) you may notice spotting of blood in your stool or on the toilet paper. If you underwent a bowel prep for your procedure, you may not have a normal bowel movement for a few days.  Please Note:  You might notice some irritation and congestion in your nose or some drainage.  This is from the oxygen used during your procedure.  There is no need for concern and it should clear up in a day or so.  SYMPTOMS TO REPORT IMMEDIATELY:  Following lower endoscopy (colonoscopy or flexible sigmoidoscopy):  Excessive amounts of blood in the stool  Significant tenderness or worsening of abdominal pains  Swelling of the abdomen that is new, acute  Fever of 100F or higher   For urgent or emergent issues, a gastroenterologist can be reached at any hour by calling [phone]. Do not use MyChart messaging for urgent concerns.    DIET:  We do recommend a small meal at first, but then you may proceed to your regular diet.  Drink plenty of fluids but you should avoid alcoholic beverages for 24  hours.  ACTIVITY:  You should plan to take it easy for the rest of today and you should NOT DRIVE or use heavy machinery until tomorrow (because of the sedation medicines used during the test).    FOLLOW UP: Our staff will call the number listed on your records the next business day following your procedure.  We will call around 7:15- 8:00 am to check on you and address any questions or concerns that you may have regarding the information given to you following your procedure. If we do not reach you, we will leave a message.     If any biopsies were taken you will be contacted by phone or by letter within the next 1-3 weeks.  Please call us at [phone] if you have not heard about the biopsies in 3 weeks.    SIGNATURES/CONFIDENTIALITY: You and/or your care partner have signed paperwork which will be entered into your electronic medical record.  These signatures attest to the fact that that the information above on your After Visit Summary has been reviewed and is understood.  Full responsibility of the confidentiality of this discharge information lies with you and/or your care-partner. 

## 2023-05-05 NOTE — Progress Notes (Signed)
Pt's states no medical or surgical changes since previsit or office visit. 

## 2023-05-05 NOTE — Progress Notes (Signed)
History and Physical Interval Note: Last exam 2/21 at [facility], multiple polyps removed. No interval changes since office visit 11/21. She feels well today, denies complaints.    05/05/2023 10:07 AM  Taylor [name]  has presented today for endoscopic procedure(s), with the diagnosis of  Encounter Diagnosis  Name Primary?   Hx of colonic polyps Yes  .  The various methods of evaluation and treatment have been discussed with the patient and/or family. After consideration of risks, benefits and other options for treatment, the patient has consented to  the endoscopic procedure(s).   The patient's history has been reviewed, patient examined, no change in status, stable for surgery.  I have reviewed the patient's chart and labs.  Questions were answered to the patient's satisfaction.    Harlin Rain, MD [facility]

## 2023-05-05 NOTE — Op Note (Signed)
[facility] Patient Name: Taylor [name] Procedure Date: 05/05/2023 10:00 AM MRN: [id number] Endoscopist: Viviann Spare P. Adela Lank , MD, [id number] Age: 72 Referring MD:  Date of Birth: 1951/03/31 Gender: Female Account #: [id number] Procedure:                Colonoscopy Indications:              High risk colon cancer surveillance: Personal                            history of colonic polyps- multiple polyps removed                            at [facility] 07/2019 Medicines:                Monitored Anesthesia Care Procedure:                Pre-Anesthesia Assessment:                           - Prior to the procedure, a History and Physical                            was performed, and patient medications and                            allergies were reviewed. The patient's tolerance of                            previous anesthesia was also reviewed. The risks                            and benefits of the procedure and the sedation                            options and risks were discussed with the patient.                            All questions were answered, and informed consent                            was obtained. Prior Anticoagulants: The patient has                            taken no anticoagulant or antiplatelet agents. ASA                            Grade Assessment: III - A patient with severe                            systemic disease. After reviewing the risks and                            benefits, the patient was deemed in satisfactory  condition to undergo the procedure.                           After obtaining informed consent, the colonoscope                            was passed under direct vision. Throughout the                            procedure, the patient's blood pressure, pulse, and                            oxygen saturations were monitored continuously. The                            CF [id number] #[id number]  was introduced through the anus                            and advanced to the the cecum, identified by                            appendiceal orifice and ileocecal valve. The                            colonoscopy was performed without difficulty. The                            patient tolerated the procedure well. The quality                            of the bowel preparation was adequate. The                            ileocecal valve, appendiceal orifice, and rectum                            were photographed. Scope In: 10:12:37 AM Scope Out: 10:42:46 AM Scope Withdrawal Time: 0 hours 22 minutes 28 seconds  Total Procedure Duration: 0 hours 30 minutes 9 seconds  Findings:                 The perianal and digital rectal examinations were                            normal.                           A 3 to 4 mm polyp was found in the ascending colon.                            The polyp was sessile. The polyp was removed with a                            cold snare. Resection and retrieval were complete.  Two flat polyps were found in the hepatic flexure.                            The polyps were 5 to roughly 15-20 mm in size.                            These polyps were removed with a cold snare.                            Resection and retrieval were complete.                           A 5 mm polyp was found in the splenic flexure. The                            polyp was flat. The polyp was removed with a cold                            snare. Resection and retrieval were complete.                           Two flat and sessile polyps were found in the                            descending colon. The polyps were 3 to 6 mm in                            size. These polyps were removed with a cold snare.                            Resection and retrieval were complete.                           A few small-mouthed diverticula were found in the                             sigmoid colon.                           The colon was long and redundant. Abdominal pressur                            utilized to achieve cecal intubation.                           Internal hemorrhoids were found during                            retroflexion. The hemorrhoids were small.                           The exam was otherwise without abnormality. Complications:            No immediate complications.  Estimated blood loss:                            Minimal. Estimated Blood Loss:     Estimated blood loss was minimal. Impression:               - One 3 to 4 mm polyp in the ascending colon,                            removed with a cold snare. Resected and retrieved.                           - Two 5 to 15 mm polyps at the hepatic flexure,                            removed with a cold snare. Resected and retrieved.                           - One 5 mm polyp at the splenic flexure, removed                            with a cold snare. Resected and retrieved.                           - Two 3 to 6 mm polyps in the descending colon,                            removed with a cold snare. Resected and retrieved.                           - Diverticulosis in the sigmoid colon.                           - Long and redundant colon.                           - Internal hemorrhoids.                           - The examination was otherwise normal. Recommendation:           - Patient has a contact number available for                            emergencies. The signs and symptoms of potential                            delayed complications were discussed with the                            patient. Return to normal activities tomorrow.                            Written discharge instructions were provided to the  patient.                           - Resume previous diet.                           - Continue present medications.                            - Await pathology results. Viviann Spare P. Braylynn Ghan, MD 05/05/2023 10:50:32 AM This report has been signed electronically.

## 2023-05-05 NOTE — Progress Notes (Signed)
Called to room to assist during endoscopic procedure.  Patient ID and intended procedure confirmed with present staff. Received instructions for my participation in the procedure from the performing physician.  

## 2023-05-06 ENCOUNTER — Telehealth: Payer: Self-pay | Admitting: *Deleted

## 2023-05-06 NOTE — Telephone Encounter (Signed)
Attempted to call patient for their post-procedure follow-up call. No answer. Left voicemail.   

## 2023-05-08 LAB — SURGICAL PATHOLOGY

## 2023-05-11 ENCOUNTER — Encounter: Payer: Self-pay | Admitting: Gastroenterology

## 2023-05-12 MED ORDER — PANTOPRAZOLE SODIUM 40 MG PO TBEC: 40.0000 mg | DELAYED_RELEASE_TABLET | Freq: Two times a day (BID) | ORAL | 2 refills | Status: DC

## 2023-05-15 NOTE — Telephone Encounter (Signed)
PT come in this afternoon to drop off her paper for  assistance for meds.

## 2023-05-19 ENCOUNTER — Telehealth: Payer: Self-pay

## 2023-05-19 NOTE — Telephone Encounter (Signed)
Pt assistance for Nexlizet dropped ff by the pt and signed by Dr Tenny Craw.   Forms signed and faxed to our office to be indexed to [facility] for further management.

## 2023-05-21 ENCOUNTER — Other Ambulatory Visit: Payer: Self-pay | Admitting: Internal Medicine

## 2023-05-25 ENCOUNTER — Other Ambulatory Visit: Payer: Self-pay

## 2023-05-25 ENCOUNTER — Other Ambulatory Visit (HOSPITAL_COMMUNITY): Payer: Self-pay

## 2023-05-25 ENCOUNTER — Telehealth: Payer: Self-pay

## 2023-05-25 DIAGNOSIS — E782 Mixed hyperlipidemia: Secondary | ICD-10-CM

## 2023-05-25 DIAGNOSIS — Z79899 Other long term (current) drug therapy: Secondary | ICD-10-CM

## 2023-05-25 DIAGNOSIS — E785 Hyperlipidemia, unspecified: Secondary | ICD-10-CM

## 2023-05-25 NOTE — Telephone Encounter (Signed)
Pharmacy Patient Advocate Encounter   Received notification from Physician's Office that prior authorization for NEXLIZET is required/requested.   Insurance verification completed.   The patient is insured through [facility] ADVANT[employer]/RX ADVANCE .   Per test claim: PA required KEY: [facility]

## 2023-05-25 NOTE — Telephone Encounter (Signed)
Pt advised.

## 2023-05-25 NOTE — Telephone Encounter (Signed)
NMR ordered and Left a message for the pt to call back.

## 2023-05-25 NOTE — Telephone Encounter (Signed)
Patient Advocate Encounter Application was on fax for Nexlizet using the [employer] form. There is not currently a program for Nexlizet, but patient qualifies for a grant.   Haze Rushing, CPhT  Pharmacy Patient Advocate Specialist  Direct Number: [phone] Fax: [phone]

## 2023-05-25 NOTE — Telephone Encounter (Signed)
Bring patient in for a lipomed panel

## 2023-05-25 NOTE — Telephone Encounter (Addendum)
Drug needs a PA. On request form they want LDL to be within the last 454 days. I think if we submit the labs from 6 months ago, they will deny our prior auth request. Can we have patient come in for an updated lipid panel?

## 2023-05-27 ENCOUNTER — Encounter: Payer: Self-pay | Admitting: Internal Medicine

## 2023-05-27 LAB — NMR, LIPOPROFILE
Cholesterol, Total: 191 mg/dL (ref 100–199)
HDL Particle Number: 38.8 umol/L (ref >=30.5)
HDL-C: 62 mg/dL (ref >39)
LDL Particle Number: 1202 nmol/L — ABNORMAL HIGH (ref <1000)
LDL Size: 21.6 nmol (ref >20.5)
LDL-C (NIH Calc): 107 mg/dL — ABNORMAL HIGH (ref 0–99)
LP-IR Score: 37 (ref <=45)
Small LDL Particle Number: 491 nmol/L (ref <=527)
Triglycerides: 124 mg/dL (ref 0–149)

## 2023-05-27 LAB — HEPATIC FUNCTION PANEL
ALT: 25 [IU]/L (ref 0–32)
AST: 21 [IU]/L (ref 0–40)
Albumin: 4 g/dL (ref 3.9–4.9)
Alkaline Phosphatase: 98 [IU]/L (ref 44–121)
Bilirubin Total: 0.3 mg/dL (ref 0.0–1.2)
Bilirubin, Direct: 0.12 mg/dL (ref 0.00–0.40)
Total Protein: 6.9 g/dL (ref 6.0–8.5)

## 2023-05-28 ENCOUNTER — Other Ambulatory Visit (HOSPITAL_COMMUNITY): Payer: Self-pay

## 2023-05-28 NOTE — Telephone Encounter (Signed)
Pharmacy Patient Advocate Encounter  Received notification from [facility] ADVANTAGE/RX ADVANCE that Prior Authorization for NEXLIZET has been APPROVED from 05/28/23 to 11/24/23. Ran test claim, Copay is $97.82. This test claim was processed through [facility]- copay amounts may vary at other pharmacies due to pharmacy/plan contracts, or as the patient moves through the different stages of their insurance plan.

## 2023-05-28 NOTE — Telephone Encounter (Signed)
Thanks for all your help Dewayne Hatch!

## 2023-05-28 NOTE — Telephone Encounter (Signed)
LDL is 102 with more particles They are larger which is good LDL should be lower given that she has evid of plaquing of CT scan Being evaluated for Nexzilet  Would start  Follow up lipomed in 8 to 12 wk with liver panel   I have sent to the pt and will send to the PA team.

## 2023-05-29 ENCOUNTER — Other Ambulatory Visit: Payer: Self-pay | Admitting: Gastroenterology

## 2023-05-29 NOTE — Telephone Encounter (Signed)
I spoke to Taylor [name] and she said it is working if she takes 2 tablets.

## 2023-05-29 NOTE — Telephone Encounter (Signed)
MyChart sent to the pt.

## 2023-05-29 NOTE — Addendum Note (Signed)
Addended by: Bertram Millard on: 05/29/2023 03:54 PM   Modules accepted: Orders

## 2023-05-29 NOTE — Telephone Encounter (Signed)
Please advise Sir, thank you. 

## 2023-05-29 NOTE — Telephone Encounter (Signed)
If this has worked for her can refill as per previous script. Thank you

## 2023-06-24 ENCOUNTER — Ambulatory Visit
Admission: RE | Admit: 2023-06-24 | Discharge: 2023-06-24 | Disposition: A | Discharge status: Home / Self Care | Payer: PPO | Source: Ambulatory Visit | Attending: Emergency Medicine | Admitting: Emergency Medicine

## 2023-06-24 DIAGNOSIS — R918 Other nonspecific abnormal finding of lung field: Secondary | ICD-10-CM

## 2023-07-03 ENCOUNTER — Ambulatory Visit: Payer: PPO | Admitting: Emergency Medicine

## 2023-07-03 ENCOUNTER — Encounter: Payer: Self-pay | Admitting: Emergency Medicine

## 2023-07-03 VITALS — BP 155/73 | HR 66 | Ht 66.0 in | Wt 246.3 lb

## 2023-07-03 DIAGNOSIS — R053 Chronic cough: Secondary | ICD-10-CM

## 2023-07-03 DIAGNOSIS — J4489 Other specified chronic obstructive pulmonary disease: Secondary | ICD-10-CM

## 2023-07-03 DIAGNOSIS — G4733 Obstructive sleep apnea (adult) (pediatric): Secondary | ICD-10-CM | POA: Diagnosis not present

## 2023-07-03 DIAGNOSIS — R918 Other nonspecific abnormal finding of lung field: Secondary | ICD-10-CM

## 2023-07-03 MED ORDER — BREZTRI AEROSPHERE 160-9-4.8 MCG/ACT IN AERO: 2.0000 | INHALATION_SPRAY | Freq: Two times a day (BID) | RESPIRATORY_TRACT | 5 refills | Status: DC

## 2023-07-03 NOTE — Patient Instructions (Signed)
We reviewed your CT scan of the chest today We will try to obtain a sputum sample to be sent for culture information Stop your Advair for now We will retry Breztri 2 puffs twice a day.  Rinse and gargle after using.  Keep track of how this medicine helps you so we can discuss it next time Keep albuterol available to use 2 puffs up to every 4 hours if needed for shortness of breath, chest tightness, wheezing.  Depending on how your coughing is doing and whether we are able to get a good sputum sample we will revisit whether you might benefit from bronchoscopy at some point in the future Please follow Dr. Delton Coombes in about 2 months

## 2023-07-03 NOTE — Assessment & Plan Note (Signed)
Stop your Advair for now We will retry Breztri 2 puffs twice a day.  Rinse and gargle after using.  Keep track of how this medicine helps you so we can discuss it next time Keep albuterol available to use 2 puffs up to every 4 hours if needed for shortness of breath, chest tightness, wheezing.

## 2023-07-03 NOTE — Assessment & Plan Note (Signed)
Her CT chest shows stable inflammatory/postinflammatory changes, some nodularity.  Question whether these findings relate to her chronic bronchitic symptoms, sputum production.  Consider options for infection, consider noninfectious inflammatory process.  She has had a negative bronchoscopy remotely but not recently.  I will check sputum cultures but if we are unable to obtain adequate data then we need to revisit a repeat bronchoscopy.  Plan to continue her surveillance CT imaging.

## 2023-07-03 NOTE — Assessment & Plan Note (Signed)
Continue CPAP.

## 2023-07-03 NOTE — Progress Notes (Signed)
Subjective:    Patient ID: Taylor [name], female    DOB: 1950-09-26, 73 y.o.   MRN: [id number]  HPI  ROV 01/29/2023 -- Taylor [name] is 62 with a history of COPD and chronic bronchitic symptoms, OSA on CPAP.  She has frequent exacerbations and has to be treated for bronchitis versus pneumonia.  Question whether she may have occult aspiration.  Her pulmonary function testing shows mild obstruction.  I treated her for an acute flare 01/12/2023, added steroid taper to doxycycline which she had already been given.  She has some waxing waning inflammatory change and pulmonary nodular disease on CT chest intermix with chronic scar.  We are planning for repeat CT in 06/2023. We changed Advair to [facility] in February, but she preferred the Advair.  She is on Zyrtec, Singulair, sudafed, she is no longer on a PPI she sometimes forgets the advair.  Today she reports that she is improved with treatment for AE. She has some intermittent chest tightness, some increased mucous burden. Using albuterol rarely now that she is improved. She has mucous running down her throat. She is wearing her CPAP, is having some trouble with humidity getting in the mask. Good compliance, good clinical response.    ROV 07/03/2023 --73 year old woman with a history of COPD and chronic bronchitic symptoms, OSA on CPAP.  She has frequent exacerbations and significant cough and mucus burden.  Her spirometry shows mild airflow obstruction.  Also with waxing and waning pulmonary nodular disease intermixed with scar, question aspiration.  We have been following serial imaging.  She is on Zyrtec, Singulair, Sudafed for chronic congestion and rhinitis.  She tried Clinical cytogeneticist but did not prefer it to her usual Advair and she is back on the Advair.  Today she reports that she continues to cough with mucous production.   CT scan of the chest done 06/24/2023 reviewed by me, shows some peribronchovascular groundglass and coarsening with traction bronchiectasis and  perilymphatic nodularity, calcified granulomas, paraseptal centrilobular emphysema.  No new suspicious pulmonary nodules.  Could consider atypical infection or inflammatory process   Review of Systems As per HPI     Objective:   Physical Exam  Vitals:   07/03/23 1008  BP: (!) 155/73  Pulse: 66  SpO2: 97%  Weight: 246 lb 4.8 oz (111.7 kg)  Height: 5\' 6"  (1.676 m)    Gen: Pleasant, overweight woman, in no distress,  normal affect  ENT: No lesions,  mouth clear,  oropharynx clear, no postnasal drip  Neck: No JVD, no stridor  Lungs: No use of accessory muscles, coarse bilaterally, no cough.  Rhonchi but no wheezing  Cardiovascular: RRR, heart sounds normal, no murmur or gallops, no peripheral edema  Musculoskeletal: No deformities, no cyanosis or clubbing  Neuro: alert, awake, non focal  Skin: Warm, no lesions or rash     Assessment & Plan:   Pulmonary nodules Her CT chest shows stable inflammatory/postinflammatory changes, some nodularity.  Question whether these findings relate to her chronic bronchitic symptoms, sputum production.  Consider options for infection, consider noninfectious inflammatory process.  She has had a negative bronchoscopy remotely but not recently.  I will check sputum cultures but if we are unable to obtain adequate data then we need to revisit a repeat bronchoscopy.  Plan to continue her surveillance CT imaging.  COPD with chronic bronchitis Stop your Advair for now We will retry Breztri 2 puffs twice a day.  Rinse and gargle after using.  Keep track of how this medicine  helps you so we can discuss it next time Keep albuterol available to use 2 puffs up to every 4 hours if needed for shortness of breath, chest tightness, wheezing.    OSA (obstructive sleep apnea) Continue CPAP     Levy Pupa, MD, PhD 07/03/2023, 12:06 PM [facility] [phone] or if no answer before 7:00PM call [phone] For any issues after  7:00PM please call eLink [phone]

## 2023-07-10 ENCOUNTER — Encounter: Payer: Self-pay | Admitting: Internal Medicine

## 2023-07-18 ENCOUNTER — Other Ambulatory Visit: Payer: Self-pay | Admitting: Internal Medicine

## 2023-07-21 ENCOUNTER — Other Ambulatory Visit: Payer: Self-pay | Admitting: Emergency Medicine

## 2023-08-01 ENCOUNTER — Other Ambulatory Visit: Payer: Self-pay | Admitting: Internal Medicine

## 2023-08-01 DIAGNOSIS — R609 Edema, unspecified: Secondary | ICD-10-CM

## 2023-08-03 ENCOUNTER — Other Ambulatory Visit: Payer: Self-pay | Admitting: Emergency Medicine

## 2023-08-06 ENCOUNTER — Other Ambulatory Visit: Payer: Self-pay | Admitting: Internal Medicine

## 2023-08-06 ENCOUNTER — Other Ambulatory Visit: Payer: Self-pay | Admitting: Gastroenterology

## 2023-08-13 ENCOUNTER — Encounter: Payer: Self-pay | Admitting: Internal Medicine

## 2023-08-24 MED ORDER — EZETIMIBE 10 MG PO TABS: 10.0000 mg | ORAL_TABLET | Freq: Every day | ORAL | 3 refills | Status: AC

## 2023-09-05 ENCOUNTER — Other Ambulatory Visit: Payer: Self-pay | Admitting: Gastroenterology

## 2023-09-09 ENCOUNTER — Other Ambulatory Visit: Payer: Self-pay | Admitting: Nurse Practitioner

## 2023-09-09 DIAGNOSIS — Z1231 Encounter for screening mammogram for malignant neoplasm of breast: Secondary | ICD-10-CM

## 2023-09-16 ENCOUNTER — Ambulatory Visit: Payer: PPO

## 2023-09-16 DIAGNOSIS — Z1231 Encounter for screening mammogram for malignant neoplasm of breast: Secondary | ICD-10-CM | POA: Diagnosis not present

## 2023-10-14 ENCOUNTER — Ambulatory Visit: Payer: PPO | Admitting: Emergency Medicine

## 2023-10-14 ENCOUNTER — Encounter: Payer: Self-pay | Admitting: Emergency Medicine

## 2023-10-14 VITALS — BP 142/73 | HR 69 | Ht 64.0 in | Wt 256.4 lb

## 2023-10-14 DIAGNOSIS — G4733 Obstructive sleep apnea (adult) (pediatric): Secondary | ICD-10-CM | POA: Diagnosis not present

## 2023-10-14 DIAGNOSIS — J301 Allergic rhinitis due to pollen: Secondary | ICD-10-CM | POA: Diagnosis not present

## 2023-10-14 DIAGNOSIS — J4489 Other specified chronic obstructive pulmonary disease: Secondary | ICD-10-CM | POA: Diagnosis not present

## 2023-10-14 NOTE — Progress Notes (Signed)
 Subjective:    Patient ID: Taylor [name], female    DOB: 1951-05-03, 73 y.o.   MRN: [id number]  HPI   ROV 07/03/2023 --73 year old woman with a history of COPD and chronic bronchitic symptoms, OSA on CPAP.  She has frequent exacerbations and significant cough and mucus burden.  Her spirometry shows mild airflow obstruction.  Also with waxing and waning pulmonary nodular disease intermixed with scar, question aspiration.  We have been following serial imaging.  She is on Zyrtec, Singulair , Sudafed for chronic congestion and rhinitis.  She tried Breztri  but did not prefer it to her usual Advair and she is back on the Advair.  Today she reports that she continues to cough with mucous production.   CT scan of the chest done 06/24/2023 reviewed by me, shows some peribronchovascular groundglass and coarsening with traction bronchiectasis and perilymphatic nodularity, calcified granulomas, paraseptal centrilobular emphysema.  No new suspicious pulmonary nodules.  Could consider atypical infection or inflammatory process  ROV 10/14/2023 --follow-up visit 73 year old woman with a history of COPD, chronic bronchitis with frequent exacerbations and mucus burden, bronchiectasis, micronodular disease and paraseptal emphysema on chest imaging.  We attempted to get a sputum sample since I last saw her but she was unable to produce.  At her last visit I changed Advair to Breztri  to see if she would better tolerate and get more benefit. She prefers the breztri , sputum is thinner than before. She has put on about 10 lbs since last time. She is having more exertional SOB, difficulty getting around. No chest tightness. She is using albuterol  a bit more but unsure how much it is helping. She is on guaifenesin , singulair , zyrtec   Review of Systems As per HPI     Objective:   Physical Exam  Vitals:   10/14/23 1546 10/14/23 1547  BP: (!) 150/78 (!) 142/73  Pulse: 69   SpO2: 96%   Weight: 256 lb 6.4 oz (116.3 kg)    Height: 5\' 4"  (1.626 m)     Gen: Pleasant, overweight woman, in no distress,  normal affect  ENT: No lesions,  mouth clear,  oropharynx clear, no postnasal drip  Neck: No JVD, no stridor  Lungs: No use of accessory muscles, coarse bilaterally, no cough.  Rhonchi but no wheezing  Cardiovascular: RRR, heart sounds normal, no murmur or gallops, no peripheral edema  Musculoskeletal: No deformities, no cyanosis or clubbing  Neuro: alert, awake, non focal  Skin: Warm, no lesions or rash     Assessment & Plan:   COPD with chronic bronchitis Please continue Breztri  2 puffs twice a day.  Rinse and gargle after using. Keep albuterol  available to use 2 puffs or 1 nebulizer treatment up to every 4 hours if needed for shortness of breath, chest tightness, wheezing.  We will hold off on bronchoscopy for now Follow with Dr Taylor [name] in 6 months or sooner if you have any problems  Allergic rhinitis Please continue your Zyrtec, Singulair , guaifenesin , Sudafed as you have been taking them We will hold off on bronchoscopy for now Continue your CPAP every night.  We will adjust your settings slightly.  OSA (obstructive sleep apnea) She will use her current CPAP pressure is too low, feels like she is not getting enough support.  She believes it set at 13 cmH2O.  I will change her to an AutoSet mode 10-20 cm water      Taylor Buddle, MD, PhD 10/15/2023, 2:28 PM [facility] [phone] or if  no answer before 7:00PM call [phone] For any issues after 7:00PM please call eLink [phone]

## 2023-10-14 NOTE — Patient Instructions (Addendum)
 Please continue Breztri  2 puffs twice a day.  Rinse and gargle after using. Keep albuterol  available to use 2 puffs or 1 nebulizer treatment up to every 4 hours if needed for shortness of breath, chest tightness, wheezing.  Please continue your Zyrtec, Singulair , guaifenesin , Sudafed as you have been taking them We will hold off on bronchoscopy for now Continue your CPAP every night.  We will adjust your settings slightly. Follow with Dr Baldwin Levee in 6 months or sooner if you have any problems .

## 2023-10-15 NOTE — Assessment & Plan Note (Addendum)
 Please continue Breztri  2 puffs twice a day.  Rinse and gargle after using. Keep albuterol  available to use 2 puffs or 1 nebulizer treatment up to every 4 hours if needed for shortness of breath, chest tightness, wheezing.  We will hold off on bronchoscopy for now Follow with Dr Baldwin Levee in 6 months or sooner if you have any problems

## 2023-10-15 NOTE — Assessment & Plan Note (Signed)
 She will use her current CPAP pressure is too low, feels like she is not getting enough support.  She believes it set at 13 cmH2O.  I will change her to an AutoSet mode 10-20 cm water

## 2023-10-15 NOTE — Assessment & Plan Note (Signed)
 Please continue your Zyrtec, Singulair , guaifenesin , Sudafed as you have been taking them We will hold off on bronchoscopy for now Continue your CPAP every night.  We will adjust your settings slightly.

## 2023-10-28 ENCOUNTER — Other Ambulatory Visit: Payer: Self-pay | Admitting: Emergency Medicine

## 2023-11-04 ENCOUNTER — Encounter: Payer: Self-pay | Admitting: Internal Medicine

## 2023-11-05 NOTE — Telephone Encounter (Signed)
 OK to Rx Ozempic   Check on coverage Will forward to pharmacy

## 2023-11-10 ENCOUNTER — Other Ambulatory Visit (HOSPITAL_COMMUNITY): Payer: Self-pay

## 2023-11-10 ENCOUNTER — Telehealth: Payer: Self-pay | Admitting: Pharmacy Technician

## 2023-11-10 ENCOUNTER — Encounter: Payer: Self-pay | Admitting: Pharmacist

## 2023-11-10 ENCOUNTER — Encounter: Payer: Self-pay | Admitting: Internal Medicine

## 2023-11-10 DIAGNOSIS — E119 Type 2 diabetes mellitus without complications: Secondary | ICD-10-CM | POA: Insufficient documentation

## 2023-11-10 MED ORDER — SEMAGLUTIDE(0.25 OR 0.5MG/DOS) 2 MG/3ML ~~LOC~~ SOPN: PEN_INJECTOR | SUBCUTANEOUS | 1 refills | Status: DC

## 2023-11-10 NOTE — Telephone Encounter (Signed)
 Pharmacy Patient Advocate Encounter  Received notification from [facility] ADVANTAGE/RX ADVANCE that Prior Authorization for Ozempic has been APPROVED from 11/10/23 to 11/09/24. Ran test claim, Copay is $47.00- one month. This test claim was processed through [facility]- copay amounts may vary at other pharmacies due to pharmacy/plan contracts, or as the patient moves through the different stages of their insurance plan.   PA #/Case ID/Reference #: [id number]

## 2023-11-10 NOTE — Addendum Note (Signed)
 Addended by: Mckynlie Vanderslice D on: 11/10/2023 04:45 PM   Modules accepted: Orders

## 2023-11-10 NOTE — Telephone Encounter (Signed)
 Pharmacy Patient Advocate Encounter   Received notification from Physician's Office that prior authorization for Ozempic is required/requested.   Insurance verification completed.   The patient is insured through [facility] ADVANTAGE/RX ADVANCE .   Per test claim: PA required; PA submitted to above mentioned insurance via CoverMyMeds Key/confirmation #/EOC [id number] Status is pending

## 2023-11-27 ENCOUNTER — Other Ambulatory Visit: Payer: Self-pay | Admitting: Gastroenterology

## 2023-12-04 ENCOUNTER — Other Ambulatory Visit: Payer: Self-pay | Admitting: Gastroenterology

## 2023-12-17 ENCOUNTER — Ambulatory Visit: Attending: Internal Medicine | Admitting: Internal Medicine

## 2023-12-17 ENCOUNTER — Encounter: Payer: Self-pay | Admitting: Internal Medicine

## 2023-12-17 VITALS — BP 130/64 | HR 72 | Ht 64.0 in | Wt 239.5 lb

## 2023-12-17 DIAGNOSIS — R7303 Prediabetes: Secondary | ICD-10-CM

## 2023-12-17 DIAGNOSIS — Z79899 Other long term (current) drug therapy: Secondary | ICD-10-CM | POA: Diagnosis not present

## 2023-12-17 DIAGNOSIS — E782 Mixed hyperlipidemia: Secondary | ICD-10-CM

## 2023-12-17 NOTE — Patient Instructions (Signed)
 Medication Instructions:  The current medical regimen is effective;  continue present plan and medications.  *If you need a refill on your cardiac medications before your next appointment, please call your pharmacy*  Lab Work: Please have blood work today as ordered.  If you have labs (blood work) drawn today and your tests are completely normal, you will receive your results only by: MyChart Message (if you have MyChart) OR A paper copy in the mail If you have any lab test that is abnormal or we need to change your treatment, we will call you to review the results.  Follow-Up: At [facility], you and your health needs are our priority.  As part of our continuing mission to provide you with exceptional heart care, our providers are all part of one team.  This team includes your primary Cardiologist (physician) and Advanced Practice Providers or APPs (Physician Assistants and Nurse Practitioners) who all work together to provide you with the care you need, when you need it.  Your next appointment:   9 month(s)  Provider:   Vina Gull, MD    We recommend signing up for the patient portal called MyChart.  Sign up information is provided on this After Visit Summary.  MyChart is used to connect with patients for Virtual Visits (Telemedicine).  Patients are able to view lab/test results, encounter notes, upcoming appointments, etc.  Non-urgent messages can be sent to your provider as well.   To learn more about what you can do with MyChart, go to [email or web address].

## 2023-12-17 NOTE — Progress Notes (Signed)
 Cardiology Office Note   Date:  12/17/2023   ID:  Taylor [name], Taylor [name] 1951-04-12, MRN [id number]  PCP:  [place], Julie M, NP  Cardiologist:   Vina Gull, MD   F/U of PAF    History of Present Illness: Taylor [name] is a 73 y.o. female with a history of OSA, COPD, DM hypothyroidism, GERD and PAF  Patient had afib initially in 2009, around time of URI.  Had intermitt after. Placed on flecanide 100 bid   In 2021 she had recurrent palpitations / afib and was seen by LELON Norton.  She underwent ablation of afib in early 2021 (CT prior to ablation showed  Ca score of 1)     After this her flecanide was stopped   With no recurrence of afib, Xarelto  was also disconfinued   Aug 2023   Pt admitted with respiratory  failure  with multfocal pneumonia   Was in afib on admit with RVR  Converted to SR I saw her in Dec 2023   She  wore monitor in Jan 2024  This  showed SR   No  Afib   Went back to ASA  I saw the pt in Nov 2024    Since seen she denies palpitations   She wears an apple watch which has noted no arrhythmias     She denies CP   No dizziness  Does get SOB with activity Stable  She follows with R Byrum in pulmonary  Seen in May 2025   Current Meds  Medication Sig   acetaminophen  (TYLENOL ) 500 MG tablet Take 650 mg by mouth every 6 (six) hours as needed for moderate pain (pain score 4-6) or headache.   albuterol  (VENTOLIN  HFA) 108 (90 Base) MCG/ACT inhaler Inhale 1 puff into the lungs every 6 (six) hours as needed for wheezing or shortness of breath.   Alpha-Lipoic Acid 100 MG CAPS Take 1 capsule by mouth daily.   Ascorbic Acid (VITAMIN C) 1000 MG tablet Take 1,000 mg by mouth daily.   aspirin  EC 81 MG tablet Take 81 mg by mouth daily.   baclofen  (LIORESAL ) 10 MG tablet Take 1-2 tablets (10-20 mg total) by mouth every 8 (eight) hours as needed for muscle spasms.   Biotin 1 MG CAPS Take by mouth.   BREZTRI  AEROSPHERE 160-9-4.8 MCG/ACT AERO Inhale 2 puffs into the lungs in the morning and  at bedtime.   buPROPion  (WELLBUTRIN  XL) 150 MG 24 hr tablet Take 150 mg by mouth 2 (two) times daily. (Patient taking differently: Take 150 mg by mouth. Take 1 tablet by mouth 4 times weekly)   cetirizine (ZYRTEC) 10 MG tablet Take 10 mg by mouth daily.    Cinnamon 500 MG TABS Take 1,000 mg by mouth daily.   Coenzyme Q10 (COQ10) 200 MG CAPS Take by mouth.   ESTRADIOL  TD Apply 1 mL topically daily. Estradiol  7.5mg /gm cream ( apply to thighs)   ezetimibe  (ZETIA ) 10 MG tablet Take 1 tablet (10 mg total) by mouth daily.   furosemide  (LASIX ) 80 MG tablet Take 1 tablet (80 mg total) by mouth daily.   glucosamine-chondroitin 500-400 MG tablet Take 1 tablet by mouth 3 (three) times daily.   Guaifenesin  1200 MG TB12 Take 1,200 mg by mouth daily.   hydrOXYzine  (ATARAX /VISTARIL ) 50 MG tablet Take 50 mg by mouth daily as needed for itching.  (Patient taking differently: Take 25 mg by mouth daily as needed for itching.)   ketoconazole (NIZORAL) 2 % cream  Apply 1 application  topically daily.   metoprolol  tartrate (LOPRESSOR ) 25 MG tablet Take 1 tablet (25 mg total) by mouth 2 (two) times daily.   montelukast  (SINGULAIR ) 10 MG tablet Take 10 mg by mouth at bedtime.   OIL OF OREGANO PO Take 1-2 capsules by mouth daily as needed (cold symptoms).    Omega-3 Fatty Acids (FISH OIL) 1000 MG CAPS Take by mouth.   OSHA PO Take by mouth.   OVER THE COUNTER MEDICATION Take 2 tablets by mouth daily at 6 (six) AM. SACROMYCES bouliardi (probiotic)   Oxycodone  HCl 10 MG TABS Take 10 mg by mouth in the morning, at noon, in the evening, and at bedtime.   pantoprazole  (PROTONIX ) 40 MG tablet Take 1 tablet (40 mg total) by mouth 2 (two) times daily.   potassium chloride  (KLOR-CON  M10) 10 MEQ tablet Take 1 tablet (10 mEq total) by mouth daily.   pregabalin  (LYRICA ) 100 MG capsule Take 100 mg by mouth 3 (three) times daily.   progesterone  (PROMETRIUM ) 100 MG capsule Take 300 mg by mouth every evening.    pseudoephedrine   (SUDAFED) 120 MG 12 hr tablet Take 120 mg by mouth daily.   Semaglutide ,0.25 or 0.5MG /DOS, 2 MG/3ML SOPN Inject 0.25mg  once a week for 4 weeks then increase to 0.5mg  weekly   thyroid  (ARMOUR) 60 MG tablet Take 30-120 mg by mouth See admin instructions. Take 120 mg in the morning and 30 mg in the afternoon   TURMERIC PO Take 1,440 mg by mouth every evening.   Vitamin D-Vitamin K (VITAMIN K2-VITAMIN D3 PO) Take 1 capsule by mouth every evening.   Zinc 20 MG CAPS Take by mouth.     Allergies:   Avelox [moxifloxacin hcl in nacl], Linezolid , Venlafaxine, Rosuvastatin , Lovastatin, Cephalexin, Nitrofurantoin, Other, Penicillins, and Statins   Past Medical History:  Diagnosis Date   Anxiety    Arthritis    hands, neck, back (07/22/2013)   Asthma    Atrial fibrillation (HCC)    CHEST DISCOMFORT    CHF (congestive heart failure) (HCC)    Chronic back pain    started in lower back; getting to be all over my back (07/22/2013)   Chronic bronchitis (HCC)    I've had it 2 years in a row; do have some trouble breathing at times; use an inhaler prn; not asthma (07/22/2013)   Chronic neck pain    Colon polyps    COPD (chronic obstructive pulmonary disease) (HCC)    DDD (degenerative disc disease), cervical    DDD (degenerative disc disease), lumbar    Depression    Dysrhythmia    GERD (gastroesophageal reflux disease)    Hepatitis 1971   don't know if it was A or B (07/22/2013)   HYPERLIPIDEMIA-MIXED 2005   went away after I started taking fish oil (07/22/2013)   Hypothyroidism    Palpitations    Pneumonia 2013   Sleep apnea    Sleep apnea treated with continuous positive airway pressure (CPAP)    Type II diabetes mellitus (HCC)    diet controlled (07/22/2013)    Past Surgical History:  Procedure Laterality Date   ANTERIOR CERVICAL DECOMP/DISCECTOMY FUSION  ?2003   w/plating (07/22/2013)   ATRIAL FIBRILLATION ABLATION N/A 09/21/2019   Procedure: ATRIAL FIBRILLATION ABLATION;   Surgeon: Inocencio Soyla Lunger, MD;  Location: [facility];  Service: Cardiovascular;  Laterality: N/A;   BUNIONECTOMY Left 06/10/2011   DILATION AND CURETTAGE OF UTERUS  06/09/1974   PLANTAR FASCIA SURGERY  Right 06/09/2009   WISDOM TOOTH EXTRACTION       Social History:  The patient  reports that she quit smoking about 15 years ago. Her smoking use included cigarettes. She started smoking about 56 years ago. She has a 40 pack-year smoking history. She has never used smokeless tobacco. She reports that she does not currently use alcohol. She reports that she does not use drugs.   Family History:  The patient's family history includes Atrial fibrillation in her brother; Brain cancer in her father; Diabetes in her maternal grandfather; Heart attack in her paternal grandfather; Heart disease in an other family member; Hyperlipidemia in her mother; Hypertension in her son.    ROS:  Please see the history of present illness. All other systems are reviewed and  Negative to the above problem except as noted.    PHYSICAL EXAM: VS:  BP 130/64 (BP Location: Left Arm, Patient Position: Sitting, Cuff Size: Large)   Pulse 72   Ht 5' 4 (1.626 m)   Wt 239 lb 8 oz (108.6 kg)   SpO2 94%   BMI 41.11 kg/m   Gen  Gr II obesity In no acute distress   Neck: JVP normal , No carotid bruits Cardiac: RRR    NO murmurs    Tr  LE edema  Respiratory  Bilateral rhonchi  MOving air.    GI: soft, nontender  No hepatomegaly    EKG:  EKG shows SR 68  Septal MI   68 bpm    Monitor Jan 2024  Normal SR     Echo   April 2024    1. Left ventricular ejection fraction, by estimation, is 60 to 65%. The  left ventricle has normal function. The left ventricle has no regional  wall motion abnormalities. Left ventricular diastolic parameters were  normal. The average left ventricular  global longitudinal strain is -19.2 %. The global longitudinal strain is  normal.   2. Right ventricular systolic function is  normal. The right ventricular  size is normal. Tricuspid regurgitation signal is inadequate for assessing  PA pressure.   3. The mitral valve is normal in structure. No evidence of mitral valve  regurgitation. No evidence of mitral stenosis.   4. The aortic valve is tricuspid. Aortic valve regurgitation is not  visualized. No aortic stenosis is present.   5. The inferior vena cava is normal in size with greater than 50%  respiratory variability, suggesting right atrial pressure of 3 mmHg.   CCTA 2021  1. There is normal pulmonary vein drainage into the left atrium with ostial measurements above.   2. There is no thrombus in the left atrial appendage.   3. The esophagus runs in the left atrial midline and is not in the proximity to any of the pulmonary veins.   4. No PFO/ASD.   5. Normal coronary origin. Right dominance.   6. CAC score of 1 which is 50th percentile for age- and sex-matched controls. No CAD in the proximal segments.   Lipid Panel    Component Value Date/Time   CHOL 192 05/07/2022 1217   TRIG 75 05/07/2022 1217   HDL 59 05/07/2022 1217   CHOLHDL 3.3 05/07/2022 1217   CHOLHDL 4.2 CALC 11/17/2007 0844   VLDL 18 11/17/2007 0844   LDLCALC 119 (H) 05/07/2022 1217      Wt Readings from Last 3 Encounters:  12/17/23 239 lb 8 oz (108.6 kg)  10/14/23 256 lb 6.4 oz (116.3 kg)  07/03/23 246  lb 4.8 oz (111.7 kg)      ASSESSMENT AND PLAN:  1  PAF Pt is s/p ablation  She is off of antiarrhythmics and antioagualation Had one recurrence when sick with pneumonia Converted to SR on own   She wore a monitor in Jan 2024 showed SR   No palpitations since  Follow   2  Hx of HFpEF   PT with normal LVEF   Volume appears OK    3 CAD  Pt with Ca score of 1    No symptoms of angina   Control risk factors     4  HTN BP is controlled   Follow     5  PAD  Pt with mild plaquing on CT  DId not tolerate statins    Tried Nexlizet     Not on now  Will check lipomed      6   COPD Signficant  Breathing is stable   Continue to follow in pulmonary  Current medicines are reviewed at length with the patient today.  The patient does not have concerns regarding medicines.  Signed, Vina Gull, MD  12/17/2023 2:22 PM    [facility] [address] [place], [place], [place]  [address] Phone: [phone]; Fax: [phone]

## 2023-12-18 ENCOUNTER — Encounter: Payer: Self-pay | Admitting: Emergency Medicine

## 2023-12-18 ENCOUNTER — Ambulatory Visit: Payer: Self-pay | Admitting: *Deleted

## 2023-12-18 LAB — HEPATIC FUNCTION PANEL
ALT: 32 IU/L (ref 0–32)
AST: 24 IU/L (ref 0–40)
Albumin: 4.5 g/dL (ref 3.9–4.9)
Alkaline Phosphatase: 106 IU/L (ref 44–121)
Bilirubin Total: 0.3 mg/dL (ref 0.0–1.2)
Bilirubin, Direct: 0.13 mg/dL (ref 0.00–0.40)
Total Protein: 7.4 g/dL (ref 6.0–8.5)

## 2023-12-18 LAB — NMR, LIPOPROFILE
Cholesterol, Total: 176 mg/dL (ref 100–199)
HDL Particle Number: 31.6 umol/L (ref >=30.5)
HDL-C: 47 mg/dL (ref >39)
LDL Particle Number: 1400 nmol/L — ABNORMAL HIGH (ref <1000)
LDL Size: 21 nm (ref >20.5)
LDL-C (NIH Calc): 100 mg/dL — ABNORMAL HIGH (ref 0–99)
LP-IR Score: 57 — ABNORMAL HIGH (ref <=45)
Small LDL Particle Number: 785 nmol/L — ABNORMAL HIGH (ref <=527)
Triglycerides: 167 mg/dL — ABNORMAL HIGH (ref 0–149)

## 2023-12-18 LAB — HEMOGLOBIN A1C
Est. average glucose Bld gHb Est-mCnc: 114 mg/dL
Hgb A1c MFr Bld: 5.6 % (ref 4.8–5.6)

## 2023-12-18 MED ORDER — DOXYCYCLINE HYCLATE 100 MG PO TABS: 100.0000 mg | ORAL_TABLET | Freq: Two times a day (BID) | ORAL | 0 refills | Status: DC

## 2023-12-18 MED ORDER — PREDNISONE 10 MG PO TABS: ORAL_TABLET | ORAL | 0 refills | Status: AC

## 2023-12-18 NOTE — Telephone Encounter (Signed)
 Ok to send pred taper > Take 40mg  daily for 3 days, then 30mg  daily for 3 days, then 20mg  daily for 3 days, then 10mg  daily for 3 days, then stop  Also doxy 100mg  bid x 7 days

## 2023-12-21 ENCOUNTER — Other Ambulatory Visit: Payer: Self-pay

## 2023-12-21 DIAGNOSIS — E785 Hyperlipidemia, unspecified: Secondary | ICD-10-CM

## 2023-12-21 DIAGNOSIS — Z79899 Other long term (current) drug therapy: Secondary | ICD-10-CM

## 2023-12-21 DIAGNOSIS — R7303 Prediabetes: Secondary | ICD-10-CM

## 2023-12-21 DIAGNOSIS — E782 Mixed hyperlipidemia: Secondary | ICD-10-CM

## 2024-01-03 ENCOUNTER — Other Ambulatory Visit: Payer: Self-pay | Admitting: Emergency Medicine

## 2024-01-04 ENCOUNTER — Encounter: Payer: Self-pay | Admitting: Pharmacist

## 2024-01-04 MED ORDER — SEMAGLUTIDE (1 MG/DOSE) 4 MG/3ML ~~LOC~~ SOPN
1.0000 mg | PEN_INJECTOR | SUBCUTANEOUS | 0 refills | Status: DC
Start: 2024-01-04 — End: 2024-02-03

## 2024-01-05 LAB — NMR, LIPOPROFILE
Cholesterol, Total: 189 mg/dL (ref 100–199)
HDL Particle Number: 32.7 umol/L (ref >=30.5)
HDL-C: 48 mg/dL (ref >39)
LDL Particle Number: 1428 nmol/L — AB (ref <1000)
LDL Size: 21.3 nm (ref >20.5)
LDL-C (NIH Calc): 112 mg/dL — AB (ref 0–99)
LP-IR Score: <25 (ref <=45)
Small LDL Particle Number: 553 nmol/L — AB (ref <=527)
Triglycerides: 165 mg/dL — AB (ref 0–149)

## 2024-01-05 LAB — HEPATIC FUNCTION PANEL
ALT: 26 IU/L (ref 0–32)
AST: 16 IU/L (ref 0–40)
Albumin: 4 g/dL (ref 3.9–4.9)
Alkaline Phosphatase: 104 IU/L (ref 44–121)
Bilirubin Total: 0.3 mg/dL (ref 0.0–1.2)
Bilirubin, Direct: 0.13 mg/dL (ref 0.00–0.40)
Total Protein: 6.8 g/dL (ref 6.0–8.5)

## 2024-01-06 ENCOUNTER — Ambulatory Visit: Payer: Self-pay | Admitting: Internal Medicine

## 2024-01-08 ENCOUNTER — Other Ambulatory Visit: Payer: Self-pay | Admitting: Internal Medicine

## 2024-01-08 DIAGNOSIS — E119 Type 2 diabetes mellitus without complications: Secondary | ICD-10-CM

## 2024-01-11 NOTE — Telephone Encounter (Signed)
 Can patient start Repatha without education? Other option is to start Leqvio which would be 2x per year infusion

## 2024-01-13 ENCOUNTER — Other Ambulatory Visit (HOSPITAL_COMMUNITY): Payer: Self-pay

## 2024-01-13 ENCOUNTER — Telehealth: Payer: Self-pay | Admitting: Pharmacy Technician

## 2024-01-13 NOTE — Telephone Encounter (Signed)
 Pharmacy Patient Advocate Encounter   Received notification from Physician's Office that prior authorization for Repatha is required/requested.   Insurance verification completed.   The patient is insured through [facility] [employer]/RX ADV[employer] .   Per test claim: PA required; PA submitted to above mentioned insurance via CoverMyMeds Key/confirmation #/EOC [id number] Status is pending

## 2024-01-13 NOTE — Telephone Encounter (Signed)
 Pharmacy Patient Advocate Encounter  Received notification from [employer] that Prior Authorization for Repatha has been APPROVED from 01/13/24 to 07/11/24. Ran test claim, Copay is $0.00- one month. This test claim was processed through [facility]- copay amounts may vary at other pharmacies due to pharmacy/plan contracts, or as the patient moves through the different stages of their insurance plan.   PA #/Case ID/Reference #: [id number]

## 2024-01-13 NOTE — Telephone Encounter (Signed)
Scanned in media as well.

## 2024-01-24 MED ORDER — REPATHA SURECLICK 140 MG/ML ~~LOC~~ SOAJ: 1.0000 mL | SUBCUTANEOUS | 11 refills | Status: AC

## 2024-01-24 NOTE — Addendum Note (Signed)
 Addended by: Lequisha Cammack D on: 01/24/2024 02:41 PM   Modules accepted: Orders

## 2024-01-27 ENCOUNTER — Encounter: Payer: Self-pay | Admitting: Internal Medicine

## 2024-02-03 MED ORDER — OZEMPIC (2 MG/DOSE) 8 MG/3ML ~~LOC~~ SOPN: 2.0000 mg | PEN_INJECTOR | SUBCUTANEOUS | 11 refills | Status: AC

## 2024-02-03 NOTE — Addendum Note (Signed)
 Addended by: Taijon Vink D on: 02/03/2024 12:36 PM   Modules accepted: Orders

## 2024-02-15 ENCOUNTER — Telehealth: Payer: Self-pay | Admitting: Internal Medicine

## 2024-02-15 ENCOUNTER — Other Ambulatory Visit: Payer: Self-pay | Admitting: Internal Medicine

## 2024-02-15 NOTE — Telephone Encounter (Signed)
 Pt c/o medication issue:  1. Name of Medication:   Semaglutide , 2 MG/DOSE, (OZEMPIC , 2 MG/DOSE,) 8 MG/3ML SOPN    2. How are you currently taking this medication (dosage and times per day)?   3. Are you having a reaction (difficulty breathing--STAT)? No  4. What is your medication issue? Pharmacy needing diagnosis code as to why medication is prescribed. Please advise

## 2024-02-15 NOTE — Telephone Encounter (Signed)
 Called patient to make her awre that this will be forward to our Pharm team for medication diagnosis code. Understanding verbalized

## 2024-02-18 NOTE — Telephone Encounter (Signed)
 Pavero, Christopher, RPH to Krasowski, Tayyiba, RN     02/15/24 11:42 AM This code will need to be given to the pharmacy Bernie Payor, RN to [facility]     02/15/24 10:51 AM Diagnosis Code E11.9.  T2DM Darrell Bruckner, [facility] to [facility] Triage      02/15/24 10:39 AM Diagnosis Code E11.9.  T2DM Trudy Frankey SAUNDERS, RN to [facility] [employer] [facility]      02/15/24 10:12 AM

## 2024-02-18 NOTE — Telephone Encounter (Signed)
Called pharmacy with ICD-10 code

## 2024-03-06 ENCOUNTER — Other Ambulatory Visit: Payer: Self-pay | Admitting: Gastroenterology

## 2024-03-12 ENCOUNTER — Other Ambulatory Visit: Payer: Self-pay | Admitting: Gastroenterology

## 2024-04-07 ENCOUNTER — Ambulatory Visit: Admitting: Emergency Medicine

## 2024-04-07 ENCOUNTER — Encounter: Payer: Self-pay | Admitting: Emergency Medicine

## 2024-04-07 VITALS — BP 129/64 | HR 67 | Temp 97.9°F | Ht 65.0 in | Wt 219.2 lb

## 2024-04-07 DIAGNOSIS — G4733 Obstructive sleep apnea (adult) (pediatric): Secondary | ICD-10-CM

## 2024-04-07 DIAGNOSIS — R918 Other nonspecific abnormal finding of lung field: Secondary | ICD-10-CM | POA: Diagnosis not present

## 2024-04-07 DIAGNOSIS — J301 Allergic rhinitis due to pollen: Secondary | ICD-10-CM | POA: Diagnosis not present

## 2024-04-07 DIAGNOSIS — J4489 Other specified chronic obstructive pulmonary disease: Secondary | ICD-10-CM | POA: Diagnosis not present

## 2024-04-07 MED ORDER — AZITHROMYCIN 250 MG PO TABS: 250.0000 mg | ORAL_TABLET | Freq: Every day | ORAL | 11 refills | Status: AC

## 2024-04-07 NOTE — Assessment & Plan Note (Signed)
 Documented good compliance and good clinical benefit today.  Plan to continue same.  AutoSet mode 10-20 cmH2O

## 2024-04-07 NOTE — Telephone Encounter (Signed)
 Thank you :)

## 2024-04-07 NOTE — Assessment & Plan Note (Signed)
 Please continue your Breztri  2 puffs twice a day.  Rinse and gargle after using. Continue albuterol  2 puffs up to every 4 hours if needed for shortness of breath, chest tightness, wheezing. Continue your guaifenesin  as you have been taking it Continue your Singulair , Zyrtec and Sudafed as you have been taking them Please start azithromycin  250 mg once daily. Start a probiotic while you are on the azithromycin 

## 2024-04-07 NOTE — Assessment & Plan Note (Signed)
-   Continue Singulair and Zyrtec

## 2024-04-07 NOTE — Progress Notes (Signed)
 Subjective:    Patient ID: Taylor [name], female    DOB: 08-18-1950, 73 y.o.   MRN: [id number]  COPD Her past medical history is significant for COPD.     ROV 07/03/2023 --73 year old woman with a history of COPD and chronic bronchitic symptoms, OSA on CPAP.  She has frequent exacerbations and significant cough and mucus burden.  Her spirometry shows mild airflow obstruction.  Also with waxing and waning pulmonary nodular disease intermixed with scar, question aspiration.  We have been following serial imaging.  She is on Zyrtec, Singulair , Sudafed for chronic congestion and rhinitis.  She tried Breztri  but did not prefer it to her usual Advair and she is back on the Advair.  Today she reports that she continues to cough with mucous production.   CT scan of the chest done 06/24/2023 reviewed by me, shows some peribronchovascular groundglass and coarsening with traction bronchiectasis and perilymphatic nodularity, calcified granulomas, paraseptal centrilobular emphysema.  No new suspicious pulmonary nodules.  Could consider atypical infection or inflammatory process  ROV 10/14/2023 --follow-up visit 73 year old woman with a history of COPD, chronic bronchitis with frequent exacerbations and mucus burden, bronchiectasis, micronodular disease and paraseptal emphysema on chest imaging.  We attempted to get a sputum sample since I last saw her but she was unable to produce.  At her last visit I changed Advair to Breztri  to see if she would better tolerate and get more benefit. She prefers the breztri , sputum is thinner than before. She has put on about 10 lbs since last time. She is having more exertional SOB, difficulty getting around. No chest tightness. She is using albuterol  a bit more but unsure how much it is helping. She is on guaifenesin  qd, singulair , zyrtec, sudafed.   ROV 04/07/2024 --73 year old woman with COPD, chronic bronchitis and bronchitic symptoms with frequent exacerbations and high  mucus burden.  She has bronchiectasis and micronodular disease on her chest imaging.  Also with OSA on CPAP which she uses reliably.  At her last visit I changed her to AutoSet 10-20 cmH2O.  She is on Breztri , guaifenesin  qd, Singulair , Zyrtec, sudafed. She was treated w abx + pred in September.  She reports improvement.  She does have daily cough and mucus production, clear sputum.   Review of Systems As per HPI     Objective:   Physical Exam  Vitals:   04/07/24 1132  BP: 129/64  Pulse: 67  Temp: 97.9 F (36.6 C)  TempSrc: Oral  SpO2: 97%  Weight: 219 lb 3.2 oz (99.4 kg)  Height: 5' 5 (1.651 m)    Gen: Pleasant, overweight woman, in no distress,  normal affect  ENT: No lesions,  mouth clear,  oropharynx clear, no postnasal drip  Neck: No JVD, no stridor  Lungs: No use of accessory muscles, coarse bilaterally, no cough.  Rhonchi but no wheezing  Cardiovascular: RRR, heart sounds normal, no murmur or gallops, no peripheral edema  Musculoskeletal: No deformities, no cyanosis or clubbing  Neuro: alert, awake, non focal  Skin: Warm, no lesions or rash     Assessment & Plan:   COPD with chronic bronchitis (HCC) Please continue your Breztri  2 puffs twice a day.  Rinse and gargle after using. Continue albuterol  2 puffs up to every 4 hours if needed for shortness of breath, chest tightness, wheezing. Continue your guaifenesin  as you have been taking it Continue your Singulair , Zyrtec and Sudafed as you have been taking them Please start azithromycin  250 mg once daily.  Start a probiotic while you are on the azithromycin   OSA (obstructive sleep apnea) Documented good compliance and good clinical benefit today.  Plan to continue same.  AutoSet mode 10-20 cmH2O  Allergic rhinitis Continue Singulair  and Zyrtec  Pulmonary nodules Micronodular disease and bronchiectasis on her CT scan of the chest.  In absence of any new symptoms I think we can plan to repeat her CT chest at  January.  We will arrange for this and then follow-up to review      Lamar Chris, MD, PhD 04/07/2024, 1:34 PM [facility] [phone] or if no answer before 7:00PM call [phone] For any issues after 7:00PM please call eLink [phone]

## 2024-04-07 NOTE — Patient Instructions (Signed)
 Please continue your Breztri  2 puffs twice a day.  Rinse and gargle after using. Continue albuterol  2 puffs up to every 4 hours if needed for shortness of breath, chest tightness, wheezing. Continue your guaifenesin  as you have been taking it Continue your Singulair , Zyrtec and Sudafed as you have been taking them Please start azithromycin  250 mg once daily. Start a probiotic while you are on the azithromycin  We will repeat your CT scan of the chest in January 2026. Please follow Dr. Shelah in January after your CT chest.  Call sooner if you have any problems.

## 2024-04-07 NOTE — Telephone Encounter (Signed)
 For your review

## 2024-04-07 NOTE — Assessment & Plan Note (Signed)
 Micronodular disease and bronchiectasis on her CT scan of the chest.  In absence of any new symptoms I think we can plan to repeat her CT chest at January.  We will arrange for this and then follow-up to review

## 2024-04-22 ENCOUNTER — Other Ambulatory Visit: Payer: Self-pay | Admitting: Gastroenterology

## 2024-04-29 ENCOUNTER — Other Ambulatory Visit: Payer: Self-pay | Admitting: Internal Medicine

## 2024-04-29 DIAGNOSIS — R609 Edema, unspecified: Secondary | ICD-10-CM

## 2024-05-10 ENCOUNTER — Encounter: Payer: Self-pay | Admitting: Emergency Medicine

## 2024-05-11 NOTE — Telephone Encounter (Signed)
 I don't think we can cut it in half. Please ask her to stop it for now.

## 2024-05-19 ENCOUNTER — Encounter: Payer: Self-pay | Admitting: Emergency Medicine

## 2024-05-19 MED ORDER — PREDNISONE 10 MG PO TABS: ORAL_TABLET | ORAL | 0 refills | Status: DC

## 2024-05-19 NOTE — Telephone Encounter (Signed)
 Prescription for prednisone  sent.  If she still having problems after taking this then she needs to be seen to ensure there is not some other explanation for her symptoms besides flaring of her lung disease.

## 2024-06-07 ENCOUNTER — Other Ambulatory Visit: Payer: Self-pay | Admitting: Gastroenterology

## 2024-06-15 ENCOUNTER — Ambulatory Visit
Admission: RE | Admit: 2024-06-15 | Discharge: 2024-06-15 | Disposition: A | Discharge status: Home / Self Care | Source: Ambulatory Visit | Attending: Emergency Medicine | Admitting: Emergency Medicine

## 2024-06-15 DIAGNOSIS — R918 Other nonspecific abnormal finding of lung field: Secondary | ICD-10-CM

## 2024-06-17 ENCOUNTER — Other Ambulatory Visit: Payer: Self-pay | Admitting: Gastroenterology

## 2024-06-24 ENCOUNTER — Ambulatory Visit: Payer: Self-pay | Admitting: Emergency Medicine

## 2024-07-06 ENCOUNTER — Encounter: Payer: Self-pay | Admitting: Emergency Medicine

## 2024-07-06 ENCOUNTER — Ambulatory Visit: Admitting: Emergency Medicine

## 2024-07-06 VITALS — BP 126/60 | HR 63 | Ht 65.0 in | Wt 214.8 lb

## 2024-07-06 DIAGNOSIS — R918 Other nonspecific abnormal finding of lung field: Secondary | ICD-10-CM

## 2024-07-06 DIAGNOSIS — J4489 Other specified chronic obstructive pulmonary disease: Secondary | ICD-10-CM | POA: Diagnosis not present

## 2024-07-06 NOTE — Assessment & Plan Note (Signed)
 Please continue your Breztri  2 puffs twice a day.  Rinse and gargle after using. Continue your Zyrtec, Singulair , guaifenesin , Sudafed as you have been taking them. Continue the azithromycin  250 mg once daily every day. It might be beneficial for you to start using a flutter valve daily to help clear mucus.  This could prevent you from developing a bronchitis.

## 2024-07-06 NOTE — Addendum Note (Signed)
 Addended by: Ramonica Grigg M on: 07/06/2024 11:44 AM   Modules accepted: Orders

## 2024-07-06 NOTE — Assessment & Plan Note (Signed)
 With bronchiectasis.  There is a new focal infiltrate in the left lower lobe.  That said her symptoms have been improved.  I think we continue to follow, we will do her repeat CT at the 77-month mark instead of waiting 1 year.  If she has recurrent progressive symptoms or if there is progression or persistence on her CT then would favor bronchoscopy to obtain culture data

## 2024-07-06 NOTE — Progress Notes (Signed)
 "  Subjective:    Patient ID: Taylor [name], female    DOB: 02/20/1951, 74 y.o.   MRN: [id number]  COPD Her past medical history is significant for COPD.    ROV 04/07/2024 --74 year old woman with COPD, chronic bronchitis and bronchitic symptoms with frequent exacerbations and high mucus burden.  She has bronchiectasis and micronodular disease on her chest imaging.  Also with OSA on CPAP which she uses reliably.  At her last visit I changed her to AutoSet 10-20 cmH2O.  She is on Breztri , guaifenesin  qd, Singulair , Zyrtec, sudafed. She was treated w abx + pred in September.  She reports improvement.  She does have daily cough and mucus production, clear sputum.  ROV 07/06/2024 -follow-up visit 74 year old woman with a history of COPD and chronic bronchitic symptoms, frequent exacerbations, high mucus burden due to bronchiectasis and micronodular disease.  She also has OSA on CPAP (AutoSet 10-20 cmH2O).  We have been managing her on Breztri , guaifenesin , Zyrtec, Singulair , Sudafed.  She had a repeat CT scan of her chest done to follow her bronchiectasis.  At her last visit I started her on azithromycin  daily to see if this would decrease her bronchitic symptoms. She believes that her mucous burden has decreased compared with last time. She does still cough - now dry. She believes her breathing has benefited as well.   CT scan of the chest 06/15/2024 reviewed by me, shows no mediastinal or hilar adenopathy, coarse ground glass in the upper lobes with associated bronchiectasis.  There are some new heterogeneous peribronchovascular nodular changes and ground glass in the right middle lobe and lingula, more prominent than on prior   Review of Systems As per HPI     Objective:   Physical Exam  Vitals:   07/06/24 1100  BP: 126/60  Pulse: 63  SpO2: 93%  Weight: 214 lb 12.8 oz (97.4 kg)  Height: 5' 5 (1.651 m)    Gen: Pleasant, overweight woman, in no distress,  normal affect  ENT: No lesions,   mouth clear,  oropharynx clear, no postnasal drip  Neck: No JVD, no stridor  Lungs: No use of accessory muscles, coarse bilaterally, no cough.  Rhonchi but no wheezing  Cardiovascular: RRR, heart sounds normal, no murmur or gallops, no peripheral edema  Musculoskeletal: No deformities, no cyanosis or clubbing  Neuro: alert, awake, non focal  Skin: Warm, no lesions or rash     Assessment & Plan:   Pulmonary nodules With bronchiectasis.  There is a new focal infiltrate in the left lower lobe.  That said her symptoms have been improved.  I think we continue to follow, we will do her repeat CT at the 75-month mark instead of waiting 1 year.  If she has recurrent progressive symptoms or if there is progression or persistence on her CT then would favor bronchoscopy to obtain culture data  COPD with chronic bronchitis (HCC) Please continue your Breztri  2 puffs twice a day.  Rinse and gargle after using. Continue your Zyrtec, Singulair , guaifenesin , Sudafed as you have been taking them. Continue the azithromycin  250 mg once daily every day. It might be beneficial for you to start using a flutter valve daily to help clear mucus.  This could prevent you from developing a bronchitis.   I personally spent a total of 30 minutes in the care of the patient today including preparing to see the patient, getting/reviewing separately obtained history, performing a medically appropriate exam/evaluation, counseling and educating, placing orders, documenting clinical information  in the EHR, independently interpreting results, and communicating results.     Lamar Chris, MD, PhD 07/06/2024, 11:39 AM [facility] [phone] or if no answer before 7:00PM call [phone] For any issues after 7:00PM please call eLink [phone]  "

## 2024-07-06 NOTE — Patient Instructions (Addendum)
 We reviewed your CT scan of the chest today. We will repeat your CT chest in July 2026. We discussed the pros and cons of possible bronchoscopy to obtain mucus culture data.  We can revisit this going forward. Please continue your Breztri  2 puffs twice a day.  Rinse and gargle after using. Continue your Zyrtec, Singulair , guaifenesin , Sudafed as you have been taking them. Continue the azithromycin  250 mg once daily every day. It might be beneficial for you to start using a flutter valve daily to help clear mucus.  This could prevent you from developing a bronchitis. Follow Dr. Shelah in July so we can review your CT scan.  Please call sooner if you have any problems.

## 2024-07-18 ENCOUNTER — Ambulatory Visit: Admitting: Nurse Practitioner
